# Patient Record
Sex: Female | Born: 1975
Health system: Southern US, Community
[De-identification: ages and names within clinical notes are randomized; demographics above are authoritative.]

## PROBLEM LIST (undated history)

## (undated) ENCOUNTER — Emergency Department (HOSPITAL_COMMUNITY): Admission: EM | Payer: Medicaid Other | Source: Home / Self Care

## (undated) DIAGNOSIS — T7840XA Allergy, unspecified, initial encounter: Secondary | ICD-10-CM

## (undated) DIAGNOSIS — D649 Anemia, unspecified: Secondary | ICD-10-CM

## (undated) DIAGNOSIS — K219 Gastro-esophageal reflux disease without esophagitis: Secondary | ICD-10-CM

## (undated) DIAGNOSIS — K2970 Gastritis, unspecified, without bleeding: Secondary | ICD-10-CM

## (undated) DIAGNOSIS — F1910 Other psychoactive substance abuse, uncomplicated: Secondary | ICD-10-CM

## (undated) DIAGNOSIS — F32A Depression, unspecified: Secondary | ICD-10-CM

## (undated) DIAGNOSIS — K859 Acute pancreatitis without necrosis or infection, unspecified: Secondary | ICD-10-CM

## (undated) DIAGNOSIS — F419 Anxiety disorder, unspecified: Secondary | ICD-10-CM

## (undated) HISTORY — DX: Depression, unspecified: F32.A

## (undated) HISTORY — DX: Acute pancreatitis without necrosis or infection, unspecified: K85.90

## (undated) HISTORY — DX: Allergy, unspecified, initial encounter: T78.40XA

## (undated) HISTORY — DX: Anxiety disorder, unspecified: F41.9

## (undated) HISTORY — PX: ABDOMINAL HYSTERECTOMY: SHX81

## (undated) HISTORY — DX: Gastritis, unspecified, without bleeding: K29.70

## (undated) HISTORY — DX: Other psychoactive substance abuse, uncomplicated: F19.10

## (undated) HISTORY — PX: LAPAROSCOPY FOR ECTOPIC PREGNANCY: SUR765

## (undated) HISTORY — PX: OTHER SURGICAL HISTORY: SHX169

---

## 2003-10-06 ENCOUNTER — Emergency Department (HOSPITAL_COMMUNITY): Admission: EM | Admit: 2003-10-06 | Discharge: 2003-10-06 | Payer: Self-pay | Admitting: Emergency Medicine

## 2004-05-24 ENCOUNTER — Emergency Department (HOSPITAL_COMMUNITY): Admission: EM | Admit: 2004-05-24 | Discharge: 2004-05-24 | Payer: Self-pay | Admitting: Family Medicine

## 2004-05-26 ENCOUNTER — Inpatient Hospital Stay (HOSPITAL_COMMUNITY): Admission: AD | Admit: 2004-05-26 | Discharge: 2004-05-26 | Payer: Self-pay | Admitting: Obstetrics & Gynecology

## 2004-11-17 ENCOUNTER — Inpatient Hospital Stay (HOSPITAL_COMMUNITY): Admission: AD | Admit: 2004-11-17 | Discharge: 2004-11-18 | Payer: Self-pay | Admitting: Obstetrics & Gynecology

## 2004-11-17 ENCOUNTER — Ambulatory Visit: Payer: Self-pay | Admitting: Obstetrics & Gynecology

## 2004-11-18 ENCOUNTER — Encounter (INDEPENDENT_AMBULATORY_CARE_PROVIDER_SITE_OTHER): Payer: Self-pay | Admitting: Specialist

## 2004-12-13 ENCOUNTER — Ambulatory Visit: Payer: Self-pay | Admitting: Obstetrics and Gynecology

## 2005-07-29 ENCOUNTER — Emergency Department (HOSPITAL_COMMUNITY): Admission: EM | Admit: 2005-07-29 | Discharge: 2005-07-29 | Payer: Self-pay | Admitting: Family Medicine

## 2008-10-03 ENCOUNTER — Emergency Department (HOSPITAL_COMMUNITY): Admission: EM | Admit: 2008-10-03 | Discharge: 2008-10-03 | Payer: Self-pay | Admitting: Emergency Medicine

## 2008-10-09 ENCOUNTER — Emergency Department (HOSPITAL_COMMUNITY): Admission: EM | Admit: 2008-10-09 | Discharge: 2008-10-09 | Payer: Self-pay | Admitting: Emergency Medicine

## 2010-07-25 LAB — POCT CARDIAC MARKERS

## 2010-09-02 NOTE — Op Note (Signed)
Danielle Reilly, Danielle Reilly                  ACCOUNT NO.:  [id number]   MEDICAL RECORD NO.:  [id number]          PATIENT TYPE:  INP   LOCATION:  9318                          FACILITY:  [facility]   PHYSICIAN:  Lesly Dukes, M.D. DATE OF BIRTH:  November 20, 1975   DATE OF PROCEDURE:  11/18/2004  DATE OF DISCHARGE:  11/18/2004                                 OPERATIVE REPORT   PREOPERATIVE DIAGNOSIS:  A 35 year old female with left ectopic pregnancy.   POSTOPERATIVE DIAGNOSIS:  A 35 year old female with left ectopic pregnancy.   OPERATION/PROCEDURE:  1.  Diagnostic laparoscopy.  2.  Left salpingectomy.  3.  Removal of ectopic pregnancy.   SURGEON:  Lesly Dukes, M.D.   ASSISTANT:  __________   ANESTHESIA:  General.   ESTIMATED BLOOD LOSS:  Minimal.   COMPLICATIONS:  None.   PATHOLOGY:  Left fallopian tube and ectopic pregnancy.   FINDINGS:  Grossly normal uterus. Filmy adhesions in the posterior cul-de-  sac, most likely secondary to Chlamydia infection.  A large ectopic  pregnancy with a dilated, swollen fallopian tube on the left.  Right tube  grossly normal but some filmy adhesions.  Bilateral ovaries are normal.   DESCRIPTION OF PROCEDURE:  After informed consent was obtained, the patient  was taken to operating room where general anesthesia was induced.  The  patient was prepared and draped in the normal sterile fashion and the  bladder was in-and-out catheterized.  Bivalve speculum was placed in the  patient's vagina and the anterior lip of the cervix grasped with a single-  tooth tenaculum.  Hulka clip was introduced into the uterus aide in  mobilization.  Gown and gloves were changed.  Infraumbilical skin incision  was made in the scalpel and carried down to the fascia.  The fascia was  incised and the peritoneum was entered bluntly.  The Hasson trocar was  introduced into the abdomen and the fascia was tagged with 0 Vicryl.  Pneumoperitoneum was achieved with CO2.  __________   was performed.  A  suprapubic port was placed 2 cm cephalic to the pubic symphysis in the  midline under direct visualization.  A left lower quadrant port was also  placed under direct visualization.  There was good hemostasis at each port  site.  The left fallopian tube was then isolated, elevated and removed with  the tripolar.  Good hemostasis was noted.  __________  irrigator was used to  evacuate old clot.  Once again there was noticed to be some adhesions to the  posterior cul-de-sac  as well as left adnexa and right adnexa.  This is  probable evidence of prior Chlamydial infection.  Hemostasis was assured as  the pneumoperitoneum was relieved and then the laparoscope and trocar were  removed as a single unit.  The fascia was closed with 0 Vicryl and the skin  on all three ports were closed in a  subcuticular fashion with 4-0 Vicryl.  The Hulka clip was removed from the  cervix and the cervix was hemostatic.  The patient tolerated the procedure  well.  Sponge,  lab, instrument, and needle counts were x2. The patient went  to the recovery room in stable condition.       KHL/MEDQ  D:  11/18/2004  T:  11/19/2004  Job:  [id number]

## 2010-12-24 ENCOUNTER — Emergency Department (HOSPITAL_COMMUNITY)
Admission: EM | Admit: 2010-12-24 | Discharge: 2010-12-25 | Disposition: A | Discharge status: Nursing Home (Includes ICF) | Payer: Self-pay | Attending: Emergency Medicine | Admitting: Emergency Medicine

## 2010-12-24 ENCOUNTER — Emergency Department (HOSPITAL_COMMUNITY): Payer: Self-pay

## 2010-12-24 DIAGNOSIS — N859 Noninflammatory disorder of uterus, unspecified: Secondary | ICD-10-CM | POA: Insufficient documentation

## 2010-12-24 DIAGNOSIS — R112 Nausea with vomiting, unspecified: Secondary | ICD-10-CM | POA: Insufficient documentation

## 2010-12-24 DIAGNOSIS — R109 Unspecified abdominal pain: Secondary | ICD-10-CM | POA: Insufficient documentation

## 2010-12-24 DIAGNOSIS — M25519 Pain in unspecified shoulder: Secondary | ICD-10-CM | POA: Insufficient documentation

## 2010-12-24 DIAGNOSIS — R079 Chest pain, unspecified: Secondary | ICD-10-CM | POA: Insufficient documentation

## 2010-12-24 DIAGNOSIS — K6289 Other specified diseases of anus and rectum: Secondary | ICD-10-CM | POA: Insufficient documentation

## 2010-12-24 DIAGNOSIS — R0989 Other specified symptoms and signs involving the circulatory and respiratory systems: Secondary | ICD-10-CM | POA: Insufficient documentation

## 2010-12-24 DIAGNOSIS — R0609 Other forms of dyspnea: Secondary | ICD-10-CM | POA: Insufficient documentation

## 2010-12-24 LAB — URINE MICROSCOPIC-ADD ON

## 2010-12-24 LAB — COMPREHENSIVE METABOLIC PANEL
Alkaline Phosphatase: 40 U/L (ref 39–117)
BUN: 10 mg/dL (ref 6–23)
Chloride: 104 mEq/L (ref 96–112)
Creatinine, Ser: 0.8 mg/dL (ref 0.50–1.10)
GFR calc Af Amer: >60 mL/min (ref >60)
Glucose, Bld: 89 mg/dL (ref 70–99)
Potassium: 4 mEq/L (ref 3.5–5.1)
Total Bilirubin: 0.9 mg/dL (ref 0.3–1.2)

## 2010-12-24 LAB — DIFFERENTIAL
Basophils Absolute: 0 10*3/uL (ref 0.0–0.1)
Eosinophils Relative: 0 % (ref 0–5)
Lymphocytes Relative: 33 % (ref 12–46)
Lymphs Abs: 2.7 10*3/uL (ref 0.7–4.0)
Monocytes Absolute: 0.7 10*3/uL (ref 0.1–1.0)
Monocytes Relative: 8 % (ref 3–12)
Neutro Abs: 4.7 10*3/uL (ref 1.7–7.7)

## 2010-12-24 LAB — URINALYSIS, ROUTINE W REFLEX MICROSCOPIC
Bilirubin Urine: NEGATIVE
Glucose, UA: NEGATIVE mg/dL
Protein, ur: NEGATIVE mg/dL

## 2010-12-24 LAB — POCT PREGNANCY, URINE: Preg Test, Ur: NEGATIVE

## 2010-12-24 LAB — WET PREP, GENITAL
Clue Cells Wet Prep HPF POC: NONE SEEN
Trich, Wet Prep: NONE SEEN
WBC, Wet Prep HPF POC: NONE SEEN
Yeast Wet Prep HPF POC: NONE SEEN

## 2010-12-24 LAB — LIPASE, BLOOD: Lipase: 32 U/L (ref 11–59)

## 2010-12-24 LAB — CBC
HCT: 31.6 % — ABNORMAL LOW (ref 36.0–46.0)
Hemoglobin: 10.7 g/dL — ABNORMAL LOW (ref 12.0–15.0)
MCH: 28.2 pg (ref 26.0–34.0)
MCHC: 33.9 g/dL (ref 30.0–36.0)
MCV: 83.2 fL (ref 78.0–100.0)
Platelets: 227 10*3/uL (ref 150–400)
RBC: 3.8 MIL/uL — ABNORMAL LOW (ref 3.87–5.11)
RDW: 14.1 % (ref 11.5–15.5)
WBC: 8.1 10*3/uL (ref 4.0–10.5)

## 2010-12-25 ENCOUNTER — Encounter (HOSPITAL_COMMUNITY): Payer: Self-pay | Admitting: Anesthesiology

## 2010-12-25 ENCOUNTER — Emergency Department (HOSPITAL_COMMUNITY): Payer: Self-pay

## 2010-12-25 ENCOUNTER — Encounter (HOSPITAL_COMMUNITY): Payer: Self-pay | Admitting: *Deleted

## 2010-12-25 ENCOUNTER — Ambulatory Visit (HOSPITAL_COMMUNITY)
Admission: AD | Admit: 2010-12-25 | Discharge: 2010-12-26 | Disposition: A | Discharge status: Home / Self Care | Payer: Self-pay | Source: Ambulatory Visit | Attending: Obstetrics & Gynecology | Admitting: Obstetrics & Gynecology

## 2010-12-25 ENCOUNTER — Encounter (HOSPITAL_COMMUNITY)
Admission: AD | Disposition: A | Discharge status: Home / Self Care | Payer: Self-pay | Source: Ambulatory Visit | Attending: Obstetrics & Gynecology

## 2010-12-25 ENCOUNTER — Inpatient Hospital Stay (HOSPITAL_COMMUNITY): Payer: Self-pay | Admitting: Anesthesiology

## 2010-12-25 DIAGNOSIS — K661 Hemoperitoneum: Secondary | ICD-10-CM | POA: Insufficient documentation

## 2010-12-25 DIAGNOSIS — N83209 Unspecified ovarian cyst, unspecified side: Principal | ICD-10-CM | POA: Insufficient documentation

## 2010-12-25 HISTORY — DX: Hemoperitoneum: K66.1

## 2010-12-25 HISTORY — PX: LAPAROSCOPY: SHX197

## 2010-12-25 LAB — HCG, SERUM, QUALITATIVE: Preg, Serum: NEGATIVE

## 2010-12-25 LAB — CBC
HCT: 29.4 % — ABNORMAL LOW (ref 36.0–46.0)
MCH: 28 pg (ref 26.0–34.0)
MCHC: 33 g/dL (ref 30.0–36.0)
MCV: 85 fL (ref 78.0–100.0)
RDW: 14.2 % (ref 11.5–15.5)

## 2010-12-25 LAB — COMPREHENSIVE METABOLIC PANEL
Albumin: 3.7 g/dL (ref 3.5–5.2)
BUN: 8 mg/dL (ref 6–23)
Calcium: 9.1 mg/dL (ref 8.4–10.5)
Creatinine, Ser: 0.78 mg/dL (ref 0.50–1.10)
Total Bilirubin: 1.1 mg/dL (ref 0.3–1.2)
Total Protein: 6.9 g/dL (ref 6.0–8.3)

## 2010-12-25 LAB — TYPE AND SCREEN: ABO/RH(D): A POS

## 2010-12-25 SURGERY — LAPAROSCOPY OPERATIVE
Anesthesia: General | Site: Abdomen | Wound class: Clean Contaminated

## 2010-12-25 MED ORDER — KETOROLAC TROMETHAMINE 30 MG/ML IJ SOLN
INTRAMUSCULAR | Status: DC | PRN
  Administered 2010-12-25: 30 mg via INTRAVENOUS

## 2010-12-25 MED ORDER — KETOROLAC TROMETHAMINE 30 MG/ML IJ SOLN
30.0000 mg | Freq: Once | INTRAMUSCULAR | Status: DC
  Filled 2010-12-25: qty 1

## 2010-12-25 MED ORDER — NEOSTIGMINE METHYLSULFATE 1 MG/ML IJ SOLN
INTRAMUSCULAR | Status: AC
  Filled 2010-12-25: qty 10

## 2010-12-25 MED ORDER — IBUPROFEN 600 MG PO TABS
600.0000 mg | ORAL_TABLET | Freq: Four times a day (QID) | ORAL | Status: DC | PRN
  Administered 2010-12-26: 600 mg via ORAL
  Filled 2010-12-25: qty 1

## 2010-12-25 MED ORDER — NEOSTIGMINE METHYLSULFATE 1 MG/ML IJ SOLN
INTRAMUSCULAR | Status: DC | PRN
  Administered 2010-12-25: 3 mg via INTRAMUSCULAR

## 2010-12-25 MED ORDER — KETOROLAC TROMETHAMINE 30 MG/ML IJ SOLN
30.0000 mg | Freq: Four times a day (QID) | INTRAMUSCULAR | Status: DC
  Administered 2010-12-25: 30 mg via INTRAVENOUS

## 2010-12-25 MED ORDER — ACETAMINOPHEN 10 MG/ML IV SOLN
1000.0000 mg | Freq: Once | INTRAVENOUS | Status: DC
  Filled 2010-12-25: qty 100

## 2010-12-25 MED ORDER — PROPOFOL 10 MG/ML IV EMUL
INTRAVENOUS | Status: AC
  Filled 2010-12-25: qty 20

## 2010-12-25 MED ORDER — ONDANSETRON HCL 4 MG PO TABS: 4.0000 mg | ORAL_TABLET | Freq: Four times a day (QID) | ORAL | Status: DC | PRN

## 2010-12-25 MED ORDER — LACTATED RINGERS IR SOLN
Status: DC | PRN
  Administered 2010-12-25: 3000 mL

## 2010-12-25 MED ORDER — FENTANYL CITRATE 0.05 MG/ML IJ SOLN: 25.0000 ug | INTRAMUSCULAR | Status: DC | PRN

## 2010-12-25 MED ORDER — FENTANYL CITRATE 0.05 MG/ML IJ SOLN
INTRAMUSCULAR | Status: AC
  Filled 2010-12-25: qty 5

## 2010-12-25 MED ORDER — SCOPOLAMINE 1 MG/3DAYS TD PT72
1.0000 | MEDICATED_PATCH | Freq: Once | TRANSDERMAL | Status: DC | PRN
  Filled 2010-12-25: qty 1

## 2010-12-25 MED ORDER — OXYCODONE-ACETAMINOPHEN 5-325 MG PO TABS
1.0000 | ORAL_TABLET | ORAL | Status: DC | PRN
  Administered 2010-12-25 – 2010-12-26 (×3): 1 via ORAL
  Filled 2010-12-25 (×3): qty 1

## 2010-12-25 MED ORDER — MORPHINE SULFATE 4 MG/ML IJ SOLN
2.0000 mg | INTRAMUSCULAR | Status: DC | PRN
  Administered 2010-12-25: 2 mg via INTRAVENOUS
  Filled 2010-12-25: qty 1

## 2010-12-25 MED ORDER — ONDANSETRON HCL 4 MG/2ML IJ SOLN: 4.0000 mg | Freq: Four times a day (QID) | INTRAMUSCULAR | Status: DC | PRN

## 2010-12-25 MED ORDER — LIDOCAINE HCL (CARDIAC) 20 MG/ML IV SOLN
INTRAVENOUS | Status: AC
  Filled 2010-12-25: qty 5

## 2010-12-25 MED ORDER — SODIUM CHLORIDE 0.9 % IJ SOLN
3.0000 mL | Freq: Two times a day (BID) | INTRAMUSCULAR | Status: DC
  Administered 2010-12-25: 3 mL via INTRAVENOUS

## 2010-12-25 MED ORDER — FAMOTIDINE 20 MG PO TABS: 20.0000 mg | ORAL_TABLET | Freq: Once | ORAL | Status: DC | PRN

## 2010-12-25 MED ORDER — MORPHINE SULFATE 2 MG/ML IJ SOLN: 1.0000 mg | INTRAMUSCULAR | Status: DC | PRN

## 2010-12-25 MED ORDER — SODIUM CHLORIDE 0.9 % IV SOLN: INTRAVENOUS | Status: DC

## 2010-12-25 MED ORDER — KETOROLAC TROMETHAMINE 30 MG/ML IJ SOLN: 30.0000 mg | Freq: Four times a day (QID) | INTRAMUSCULAR | Status: DC

## 2010-12-25 MED ORDER — PANTOPRAZOLE SODIUM 40 MG PO TBEC
40.0000 mg | DELAYED_RELEASE_TABLET | Freq: Once | ORAL | Status: DC | PRN
  Filled 2010-12-25: qty 1

## 2010-12-25 MED ORDER — MAGNESIUM HYDROXIDE 400 MG/5ML PO SUSP: 30.0000 mL | Freq: Every day | ORAL | Status: DC | PRN

## 2010-12-25 MED ORDER — ROCURONIUM BROMIDE 50 MG/5ML IV SOLN
INTRAVENOUS | Status: AC
  Filled 2010-12-25: qty 1

## 2010-12-25 MED ORDER — CITRIC ACID-SODIUM CITRATE 334-500 MG/5ML PO SOLN: 30.0000 mL | Freq: Once | ORAL | Status: DC | PRN

## 2010-12-25 MED ORDER — ACETAMINOPHEN 10 MG/ML IV SOLN
INTRAVENOUS | Status: DC | PRN
  Administered 2010-12-25: 1000 mg via INTRAVENOUS

## 2010-12-25 MED ORDER — ONDANSETRON HCL 4 MG/2ML IJ SOLN
INTRAMUSCULAR | Status: DC | PRN
  Administered 2010-12-25: 4 mg via INTRAVENOUS

## 2010-12-25 MED ORDER — LACTATED RINGERS IV SOLN
INTRAVENOUS | Status: DC | PRN
  Administered 2010-12-25 (×2): via INTRAVENOUS

## 2010-12-25 MED ORDER — DEXTROSE IN LACTATED RINGERS 5 % IV SOLN: INTRAVENOUS | Status: DC

## 2010-12-25 MED ORDER — DEXAMETHASONE SODIUM PHOSPHATE 4 MG/ML IJ SOLN
INTRAMUSCULAR | Status: DC | PRN
  Administered 2010-12-25: 10 mg via INTRAVENOUS

## 2010-12-25 MED ORDER — KETOROLAC TROMETHAMINE 30 MG/ML IJ SOLN
INTRAMUSCULAR | Status: AC
  Filled 2010-12-25: qty 1

## 2010-12-25 MED ORDER — LIDOCAINE HCL (CARDIAC) 20 MG/ML IV SOLN
INTRAVENOUS | Status: DC | PRN
  Administered 2010-12-25: 50 mg via INTRAVENOUS

## 2010-12-25 MED ORDER — GLYCOPYRROLATE 0.2 MG/ML IJ SOLN
INTRAMUSCULAR | Status: DC | PRN
  Administered 2010-12-25: .6 mg via INTRAVENOUS

## 2010-12-25 MED ORDER — PROPOFOL 10 MG/ML IV EMUL
INTRAVENOUS | Status: DC | PRN
  Administered 2010-12-25: 150 mg via INTRAVENOUS

## 2010-12-25 MED ORDER — ZOLPIDEM TARTRATE 5 MG PO TABS: 5.0000 mg | ORAL_TABLET | Freq: Every evening | ORAL | Status: DC | PRN

## 2010-12-25 MED ORDER — METOCLOPRAMIDE HCL 10 MG PO TABS: 10.0000 mg | ORAL_TABLET | Freq: Once | ORAL | Status: DC | PRN

## 2010-12-25 MED ORDER — IOHEXOL 300 MG/ML  SOLN
100.0000 mL | Freq: Once | INTRAMUSCULAR | Status: AC | PRN
  Administered 2010-12-25: 100 mL via INTRAVENOUS

## 2010-12-25 MED ORDER — MIDAZOLAM HCL 5 MG/5ML IJ SOLN
INTRAMUSCULAR | Status: DC | PRN
  Administered 2010-12-25: 2 mg via INTRAVENOUS

## 2010-12-25 MED ORDER — CEFAZOLIN SODIUM 1-5 GM-% IV SOLN
1.0000 g | Freq: Once | INTRAVENOUS | Status: AC
  Administered 2010-12-25: 1 g via INTRAVENOUS
  Filled 2010-12-25: qty 50

## 2010-12-25 MED ORDER — MORPHINE SULFATE 4 MG/ML IJ SOLN
1.0000 mg | INTRAMUSCULAR | Status: DC | PRN
  Administered 2010-12-25: 2 mg via INTRAVENOUS
  Filled 2010-12-25: qty 1

## 2010-12-25 MED ORDER — ONDANSETRON HCL 4 MG/2ML IJ SOLN
INTRAMUSCULAR | Status: AC
  Filled 2010-12-25: qty 2

## 2010-12-25 MED ORDER — MIDAZOLAM HCL 2 MG/2ML IJ SOLN
INTRAMUSCULAR | Status: AC
  Filled 2010-12-25: qty 2

## 2010-12-25 MED ORDER — BUPIVACAINE HCL (PF) 0.25 % IJ SOLN
INTRAMUSCULAR | Status: DC | PRN
  Administered 2010-12-25: 4 mL

## 2010-12-25 MED ORDER — GLYCOPYRROLATE 0.2 MG/ML IJ SOLN
INTRAMUSCULAR | Status: AC
  Filled 2010-12-25: qty 1

## 2010-12-25 MED ORDER — FENTANYL CITRATE 0.05 MG/ML IJ SOLN
INTRAMUSCULAR | Status: DC | PRN
  Administered 2010-12-25: 50 ug via INTRAVENOUS
  Administered 2010-12-25: 100 ug via INTRAVENOUS
  Administered 2010-12-25: 50 ug via INTRAVENOUS

## 2010-12-25 MED ORDER — ROCURONIUM BROMIDE 100 MG/10ML IV SOLN
INTRAVENOUS | Status: DC | PRN
  Administered 2010-12-25: 40 mg via INTRAVENOUS

## 2010-12-25 SURGICAL SUPPLY — 36 items
CABLE HIGH FREQUENCY MONO STRZ (ELECTRODE) IMPLANT
CANISTER SUCTION 2500CC (MISCELLANEOUS) ×2 IMPLANT
CATH ROBINSON RED A/P 16FR (CATHETERS) IMPLANT
CHLORAPREP W/TINT 26ML (MISCELLANEOUS) ×2 IMPLANT
CLOTH BEACON ORANGE TIMEOUT ST (SAFETY) ×2 IMPLANT
CONT SPECI 4OZ STER CLIK (MISCELLANEOUS) IMPLANT
DERMABOND ADVANCED (GAUZE/BANDAGES/DRESSINGS) ×1
DERMABOND ADVANCED .7 DNX12 (GAUZE/BANDAGES/DRESSINGS) ×1 IMPLANT
DRAPE UTILITY XL STRL (DRAPES) IMPLANT
DRSG COVADERM PLUS 2X2 (GAUZE/BANDAGES/DRESSINGS) ×2 IMPLANT
GAUZE SPONGE 4X4 16PLY XRAY LF (GAUZE/BANDAGES/DRESSINGS) ×2 IMPLANT
GLOVE BIO SURGEON STRL SZ 6.5 (GLOVE) ×8 IMPLANT
GOWN PREVENTION PLUS LG XLONG (DISPOSABLE) ×6 IMPLANT
NS IRRIG 1000ML POUR BTL (IV SOLUTION) ×2 IMPLANT
PACK ABDOMINAL GYN (CUSTOM PROCEDURE TRAY) ×2 IMPLANT
PACK LAPAROSCOPY BASIN (CUSTOM PROCEDURE TRAY) ×2 IMPLANT
POUCH SPECIMEN RETRIEVAL 10MM (ENDOMECHANICALS) IMPLANT
SEALER TISSUE G2 CVD JAW 35 (ENDOMECHANICALS) IMPLANT
SEALER TISSUE G2 CVD JAW 45CM (ENDOMECHANICALS) IMPLANT
SET IRRIG TUBING LAPAROSCOPIC (IRRIGATION / IRRIGATOR) ×2 IMPLANT
SPONGE LAP 18X18 X RAY DECT (DISPOSABLE) ×4 IMPLANT
SUT MNCRL AB 4-0 PS2 18 (SUTURE) ×2 IMPLANT
SUT MON AB 2-0 CT1 27 (SUTURE) ×2 IMPLANT
SUT MON AB 3-0 SH 27 (SUTURE) ×1
SUT MON AB 3-0 SH27 (SUTURE) ×1 IMPLANT
SUT PDS AB 1 CTX 36 (SUTURE) ×2 IMPLANT
SUT VIC AB 0 CT1 27 (SUTURE) ×1
SUT VIC AB 0 CT1 27XCR 8 STRN (SUTURE) ×1 IMPLANT
SUT VICRYL 0 TIES 12 18 (SUTURE) ×2 IMPLANT
SUT VICRYL 0 UR6 27IN ABS (SUTURE) ×2 IMPLANT
SUT VICRYL 4-0 PS2 18IN ABS (SUTURE) IMPLANT
TOWEL OR 17X24 6PK STRL BLUE (TOWEL DISPOSABLE) ×4 IMPLANT
TRAY FOLEY CATH 14FR (SET/KITS/TRAYS/PACK) ×2 IMPLANT
TROCAR XCEL NON-BLD 11X100MML (ENDOMECHANICALS) ×2 IMPLANT
TROCAR XCEL NON-BLD 5MMX100MML (ENDOMECHANICALS) ×2 IMPLANT
WATER STERILE IRR 1000ML POUR (IV SOLUTION) ×2 IMPLANT

## 2010-12-25 NOTE — Anesthesia Postprocedure Evaluation (Signed)
Anesthesia Post Note  Patient: Danielle Reilly  Procedure(s) Performed:  LAPAROSCOPY OPERATIVE - Operative laparoscopy /cauterization of right hemorrhagic cyst wall. Removal of belly rings.  Anesthesia type: GA  Patient location: PACU  Post pain: Pain level controlled  Post assessment: Post-op Vital signs reviewed  Last Vitals:  Filed Vitals:   12/25/10 1345  BP: 105/54  Pulse: 65  Temp:   Resp: 16    Post vital signs: Reviewed  Level of consciousness: sedated  Complications: No apparent anesthesia complications
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
## 2010-12-25 NOTE — Transfer of Care (Signed)
Immediate Anesthesia Transfer of Care Note  Patient: Danielle Reilly  Procedure(s) Performed:  LAPAROSCOPY OPERATIVE - Operative laparoscopy /cauterization of right hemorrhagic cyst wall  Patient Location: PACU  Anesthesia Type: General  Level of Consciousness: awake, alert  and oriented  Airway & Oxygen Therapy: Patient Spontanous Breathing and Patient connected to nasal cannula oxygen  Post-op Assessment: Report given to PACU RN and Post -op Vital signs reviewed and stable  Post vital signs: Reviewed and stable  Complications: No apparent anesthesia complications

## 2010-12-25 NOTE — Op Note (Signed)
Procedure Note  Danielle Reilly 35 y.o. 12/25/2010  Preoperative Diagnosis:  Hemoperitoneum, R/O traumatic uterine rupture  Postoperative Diagnosis: Hemoperitoneum, ruptured hemorrhagic right ovarian cyst  Procedure: Laparoscopic coagulation of ovarian cortex rent, evacuation of hemoperitoneum  Surgeon: Roseanna Rainbow  Assistant: Zelphia Cairo  Indications:  The patient now presents for a diagnostic, possible operative laparoscopy        Procedure Detail:  The patient was taken to the operating room and was placed on the operating table in the dorsal supine position.  After satisfactory general anesthesia was achieved, the patient was placed in the semi-lithotomy position using Allen stirrups. The patient was prepped and draped in the usual sterile manner for vaginal laparoscopic procedure. A speculum was placed in the vagina. The anterior lip of the cervix was grasped with a single-tooth tenaculum. A Khan cannula manipulator was then advanced into the uterus and secured  to the anterior lip of the cervix as a means to manipulate the uterus. The manipulator was placed after the pelvis had been inspected at laparoscopy. The infraumbilical region was then anesthetized with local anesthesia, quarter percent Artane. A small incision was made to the skin and subcutaneous tissue. A 10 mm Optiview trocar was placed through the incision into the abdominal cavity diagnostic laparoscope with video camera attached was placed through the trocar sleeve and carbon dioxide was used to insufflate the abdominal and pelvic cavity. Bilateral 5 mm ports were placed in the abdomen under direct visualization.  The pelvic contents were examined.  The posterior uterine wall was intact.  There was clot adherent to the right ovary.  On further inspection there was a hemorrhagic cystic lesion.  There was a small rent, approximately 1 cm in length.  There was oozing at the site.   Kleppinger bipolar forceps were used to  grasp and coagulate this site. Adequate hemostasis was noted.  The abdomen and pelvis were copiously irrigated the blood/clots aspirated.  The scope was removed and the excess carbon dioxide was remove through the periumbilical port, before it was removed.  The subcutaneous layer of this incision was reapproximated with an interrupted figure-of eight 0-Vicryl suture on a UR 6 needle. The skin incisions were reapproximated with stitches of 4-0 Vicryl suture or an adhesive.  The instruments removed from the vagina there is minimal bleeding from the cervix. Final sponge, instrument and needle counts were correct. The patient was awakened on the operating table and taken to the PACU in satisfactory condition.    Findings: See above   Estimated Blood Loss:  Minimal              Total IV Fluids:  per Anesthesiology        Condition: Stable

## 2010-12-25 NOTE — Anesthesia Preprocedure Evaluation (Addendum)
Anesthesia Evaluation  Name, MR# and DOB Patient awake  General Assessment Comment  Reviewed: Allergy & Precautions, H&P , Patient's Chart, lab work & pertinent test results, reviewed documented beta blocker date and time   History of Anesthesia Complications Negative for: history of anesthetic complications  Airway Mallampati: II TM Distance: >3 FB Neck ROM: full    Dental No notable dental hx.    Pulmonary  clear to auscultation  pulmonary exam normalPulmonary Exam Normal breath sounds clear to auscultation none    Cardiovascular Exercise Tolerance: Good regular Normal    Neuro/Psych Negative Neurological ROS  Negative Psych ROS  GI/Hepatic/Renal negative GI ROS  negative Liver ROS  negative Renal ROS        Endo/Other  Negative Endocrine ROS (+)      Abdominal   Musculoskeletal   Hematology negative hematology ROS (+)   Peds  Reproductive/Obstetrics negative OB ROS    Anesthesia Other Findings             Anesthesia Physical Anesthesia Plan  ASA: II and Emergent  Anesthesia Plan: General   Post-op Pain Management:    Induction:   Airway Management Planned: Oral ETT  Additional Equipment:   Intra-op Plan:   Post-operative Plan:   Informed Consent: I have reviewed the patients History and Physical, chart, labs and discussed the procedure including the risks, benefits and alternatives for the proposed anesthesia with the patient or authorized representative who has indicated his/her understanding and acceptance.   Dental Advisory Given  Plan Discussed with: CRNA and Surgeon  Anesthesia Plan Comments:         Anesthesia Quick Evaluation

## 2010-12-26 DIAGNOSIS — N83209 Unspecified ovarian cyst, unspecified side: Secondary | ICD-10-CM | POA: Diagnosis present

## 2010-12-26 LAB — GC/CHLAMYDIA PROBE AMP, GENITAL: GC Probe Amp, Genital: NEGATIVE

## 2010-12-26 NOTE — Progress Notes (Signed)
Pt discharged home... Condition stable... No equipment... Ambulated to car with Carol Riley, NT.  

## 2010-12-26 NOTE — Progress Notes (Signed)
1 Day Post-Op Procedure(s) (LRB): LAPAROSCOPY OPERATIVE (N/A)  Subjective: Patient reports no problems voiding.    Objective: Vital signs in last 24 hours: Temp:  [97.7 F (36.5 C)-98.9 F (37.2 C)] 98.9 F (37.2 C) (09/10 0550) Pulse Rate:  [63-110] 96  (09/10 0550) Resp:  [16-18] 16  (09/10 0550) BP: (89-117)/(47-69) 89/47 mmHg (09/10 0550) SpO2:  [96 %-100 %] 100 % (09/10 0550) Last BM Date: 12/24/10  Intake/Output from previous day: 09/09 0701 - 09/10 0700 In: 2383 [P.O.:480; I.V.:1903] Out: 1475 [Urine:1375; Blood:100] Intake/Output this shift:    Physical Examination:  General: alert GI: soft, non-tender; bowel sounds normal; no masses,  no organomegaly   Labs:       Assessment:  35 y.o. s/p Procedure(s): LAPAROSCOPY OPERATIVE: stable  Pain: Pain is well-controlled on  oral medications.  Heme: Anemia: stable    Plan: Discharge home   LOS: 1 day     JACKSON-MOORE,Amylynn Fano A 12/26/2010, 9:10 AM

## 2010-12-26 NOTE — Discharge Summary (Signed)
  Physician Discharge Summary  Patient ID: Danielle Reilly MRN: [id number] DOB/AGE: [birth year] 35 y.o.  Admit date: 12/25/2010 Discharge date: 12/26/2010  Admission Diagnoses: Hemoperitoneum  Discharge Diagnoses:  Active Problems:  Hemoperitoneum  Hemorrhagic ovarian cyst   Discharged Condition: good  Hospital Course: The patient was admitted an underwent an operative laparoscopy.  Please see the operative summary for details.  The postoperative course was uneventful.  Consults: none  Significant Diagnostic Studies: radiology: CT scan and Ultrasound: showed a hemoperitoneum?uterine rupture  Treatments: surgery: See above  Discharge Exam: Blood pressure 89/47, pulse 96, temperature 98.9 F (37.2 C), temperature source Oral, resp. rate 16, height 5\' 3"  (1.6 m), weight 63.549 kg (140 lb 1.6 oz), last menstrual period 11/27/2010, SpO2 100.00%. General appearance: alert GI: soft, non-tender; bowel sounds normal; no masses,  no organomegaly  Disposition: Home   Current Discharge Medication List    CONTINUE these medications which have NOT CHANGED   Details  cyclobenzaprine (FLEXERIL) 10 MG tablet Take 10 mg by mouth 3 (three) times daily as needed. For pain     HYDROcodone-acetaminophen (VICODIN) 5-500 MG per tablet Take 1 tablet by mouth every 6 (six) hours as needed. For pain     Multiple Vitamins-Calcium (ONE-A-DAY WOMENS PO) Take 1 tablet by mouth daily.         Follow-up Information    Follow up with Antionette Char A, MD. Call in 6 weeks.   Contact information:   [address], [address] [place] [address] [phone]          Signed: Roseanna Rainbow 12/26/2010, 9:06 AM

## 2011-01-12 ENCOUNTER — Encounter (HOSPITAL_COMMUNITY): Payer: Self-pay | Admitting: Obstetrics & Gynecology

## 2012-05-23 ENCOUNTER — Emergency Department (INDEPENDENT_AMBULATORY_CARE_PROVIDER_SITE_OTHER)
Admission: EM | Admit: 2012-05-23 | Discharge: 2012-05-23 | Disposition: A | Discharge status: Home / Self Care | Payer: Self-pay | Source: Home / Self Care | Attending: Emergency Medicine | Admitting: Emergency Medicine

## 2012-05-23 ENCOUNTER — Encounter (HOSPITAL_COMMUNITY): Payer: Self-pay | Admitting: Emergency Medicine

## 2012-05-23 DIAGNOSIS — J069 Acute upper respiratory infection, unspecified: Secondary | ICD-10-CM

## 2012-05-23 DIAGNOSIS — K5900 Constipation, unspecified: Secondary | ICD-10-CM

## 2012-05-23 MED ORDER — POLYETHYLENE GLYCOL 3350 17 GM/SCOOP PO POWD: 17.0000 g | Freq: Every day | ORAL | Status: DC

## 2012-05-23 MED ORDER — FEXOFENADINE-PSEUDOEPHED ER 60-120 MG PO TB12: 1.0000 | ORAL_TABLET | Freq: Two times a day (BID) | ORAL | Status: DC

## 2012-05-23 MED ORDER — NAPROXEN 500 MG PO TABS: 500.0000 mg | ORAL_TABLET | Freq: Two times a day (BID) | ORAL | Status: DC

## 2012-05-23 MED ORDER — BENZONATATE 200 MG PO CAPS: 200.0000 mg | ORAL_CAPSULE | Freq: Three times a day (TID) | ORAL | Status: DC | PRN

## 2012-05-23 NOTE — ED Provider Notes (Signed)
Chief Complaint  Patient presents with  . URI    History of Present Illness:   Danielle Reilly  is a 37 year old female who has had a five-day history of bilateral ear aching and congestion, sore throat, dry cough, wheezing, chest pain, nasal congestion with yellow drainage, headache, watery eyes, chills, and it has felt lightheaded. She has been constipated lately and has not had a bowel movement since symptoms began. She denies any fever, nausea, vomiting, or abdominal pain. She has not been exposed to anything in particular. She is not a cigarette smoker. She has not taken any medications at home for symptom relief.  Review of Systems:  Other than noted above, the patient denies any of the following symptoms. Systemic:  No fever, chills, sweats, fatigue, myalgias, headache, or anorexia. Eye:  No redness, pain or drainage. ENT:  No earache, ear congestion, nasal congestion, sneezing, rhinorrhea, sinus pressure, sinus pain, post nasal drip, or sore throat. Lungs:  No cough, sputum production, wheezing, shortness of breath, or chest pain. GI:  No abdominal pain, nausea, vomiting, or diarrhea.  PMFSH:  Past medical history, family history, social history, meds, and allergies were reviewed.  Physical Exam:   Vital signs:  BP 106/65  Pulse 78  Temp 98.5 F (36.9 C) (Oral)  Resp 18  SpO2 98%  LMP 05/13/2012 General:  Alert, in no distress. Eye:  No conjunctival injection or drainage. Lids were normal. ENT:  Right TM was slightly pink but there was no air-fluid level and no bulging, no pus, canal is clear, left TM and canal are normal.  Nasal mucosa was clear and uncongested, without drainage.  Mucous membranes were moist.  Pharynx was clear, without exudate or drainage.  There were no oral ulcerations or lesions. Neck:  Supple, no adenopathy, tenderness or mass. Lungs:  No respiratory distress.  Lungs were clear to auscultation, without wheezes, rales or rhonchi.  Breath sounds were clear and  equal bilaterally.  Heart:  Regular rhythm, without gallops, murmers or rubs. Skin:  Clear, warm, and dry, without rash or lesions.  Assessment:  The primary encounter diagnosis was Viral upper respiratory infection. A diagnosis of Constipated was also pertinent to this visit.  Plan:   1.  The following meds were prescribed:   New Prescriptions   BENZONATATE (TESSALON) 200 MG CAPSULE    Take 1 capsule (200 mg total) by mouth 3 (three) times daily as needed for cough.   FEXOFENADINE-PSEUDOEPHEDRINE (ALLEGRA-D) 60-120 MG PER TABLET    Take 1 tablet by mouth every 12 (twelve) hours.   NAPROXEN (NAPROSYN) 500 MG TABLET    Take 1 tablet (500 mg total) by mouth 2 (two) times daily.   POLYETHYLENE GLYCOL POWDER (MIRALAX) POWDER    Take 17 g by mouth daily.   2.  The patient was instructed in symptomatic care and handouts were given. 3.  The patient was told to return if becoming worse in any way, if no better in 3 or 4 days, and given some red flag symptoms that would indicate earlier return.   Reuben Likes, MD 05/23/12 1409

## 2012-05-23 NOTE — ED Notes (Signed)
Symptoms: cough, chest congestion, both ears aching, sore throat, runny nose, and body aches and denies fever.  Onset of symptoms was Sunday 05/19/12.  Last bm was Sunday.  Denies any history of constipation

## 2012-10-16 ENCOUNTER — Ambulatory Visit: Payer: Self-pay | Attending: Family Medicine | Admitting: Internal Medicine

## 2012-10-16 ENCOUNTER — Other Ambulatory Visit: Payer: Self-pay | Admitting: Internal Medicine

## 2012-10-16 VITALS — BP 119/75 | HR 72 | Temp 98.9°F | Resp 15 | Wt 155.2 lb

## 2012-10-16 DIAGNOSIS — J020 Streptococcal pharyngitis: Secondary | ICD-10-CM

## 2012-10-16 LAB — CBC WITH DIFFERENTIAL/PLATELET
Basophils Relative: 1 % (ref 0–1)
Eosinophils Absolute: 0 10*3/uL (ref 0.0–0.7)
HCT: 28.3 % — ABNORMAL LOW (ref 36.0–46.0)
Hemoglobin: 9.5 g/dL — ABNORMAL LOW (ref 12.0–15.0)
Lymphocytes Relative: 34 % (ref 12–46)
Lymphs Abs: 1.9 10*3/uL (ref 0.7–4.0)
Monocytes Absolute: 0.5 10*3/uL (ref 0.1–1.0)
Monocytes Relative: 9 % (ref 3–12)
Neutro Abs: 3.2 10*3/uL (ref 1.7–7.7)
RBC: 3.65 MIL/uL — ABNORMAL LOW (ref 3.87–5.11)
WBC: 5.7 10*3/uL (ref 4.0–10.5)

## 2012-10-16 MED ORDER — AMOXICILLIN-POT CLAVULANATE 875-125 MG PO TABS: 1.0000 | ORAL_TABLET | Freq: Two times a day (BID) | ORAL | Status: AC

## 2012-10-16 MED ORDER — MENTHOL 5 MG MT LOZG: LOZENGE | OROMUCOSAL | Status: DC

## 2012-10-16 NOTE — Progress Notes (Unsigned)
Patient complains of sore throat and left ear pain past two days Not able to swallow or spit

## 2012-10-16 NOTE — Progress Notes (Unsigned)
Patient ID: Danielle Reilly, female   DOB: [birth year], 37 y.o.   MRN: [id number]  CC:  HPI: 37 year old female who presents for evaluation of a sore throat. The patient complains of left-sided tonsillar pain as well as left ear pain. She complains of pain when she swallows. She denies any fever.   No Known Allergies History reviewed. No pertinent past medical history. Current Outpatient Prescriptions on File Prior to Visit  Medication Sig Dispense Refill  . benzonatate (TESSALON) 200 MG capsule Take 1 capsule (200 mg total) by mouth 3 (three) times daily as needed for cough.  30 capsule  0  . Chlorphen-Pseudoephed-APAP (THERAFLU FLU/COLD PO) Take by mouth.      . cyclobenzaprine (FLEXERIL) 10 MG tablet Take 10 mg by mouth 3 (three) times daily as needed. For pain       . fexofenadine-pseudoephedrine (ALLEGRA-D) 60-120 MG per tablet Take 1 tablet by mouth every 12 (twelve) hours.  30 tablet  0  . HYDROcodone-acetaminophen (VICODIN) 5-500 MG per tablet Take 1 tablet by mouth every 6 (six) hours as needed. For pain       . Multiple Vitamins-Calcium (ONE-A-DAY WOMENS PO) Take 1 tablet by mouth daily.        . naproxen (NAPROSYN) 500 MG tablet Take 1 tablet (500 mg total) by mouth 2 (two) times daily.  30 tablet  0  . polyethylene glycol powder (MIRALAX) powder Take 17 g by mouth daily.  255 g  0   No current facility-administered medications on file prior to visit.   Family History  Problem Relation Age of Onset  . Drug abuse Mother   . Arthritis Maternal Aunt   . Cancer Maternal Aunt   . Drug abuse Maternal Aunt   . Arthritis Maternal Uncle   . Alcohol abuse Maternal Uncle   . Cancer Maternal Uncle   . Drug abuse Maternal Uncle   . Cancer Maternal Grandmother   . Alcohol abuse Maternal Grandfather   . Cancer Maternal Grandfather    History   Social History  . Marital Status: Legally Separated    Spouse Name: N/A    Number of Children: N/A  . Years of Education: N/A   Occupational  History  . Not on file.   Social History Main Topics  . Smoking status: Never Smoker   . Smokeless tobacco: Not on file  . Alcohol Use: 1.8 oz/week    3 Glasses of wine per week  . Drug Use:   . Sexually Active: Yes    Birth Control/ Protection: Condom   Other Topics Concern  . Not on file   Social History Narrative  . No narrative on file    Review of Systems  Constitutional: Negative for fever, chills, diaphoresis, activity change, appetite change and fatigue.  HENT: Negative for ear pain, nosebleeds, congestion, facial swelling, rhinorrhea, neck pain, neck stiffness and ear discharge.   Eyes: Negative for pain, discharge, redness, itching and visual disturbance.  Respiratory: Negative for cough, choking, chest tightness, shortness of breath, wheezing and stridor.   Cardiovascular: Negative for chest pain, palpitations and leg swelling.  Gastrointestinal: Negative for abdominal distention.  Genitourinary: Negative for dysuria, urgency, frequency, hematuria, flank pain, decreased urine volume, difficulty urinating and dyspareunia.  Musculoskeletal: Negative for back pain, joint swelling, arthralgias and gait problem.  Neurological: Negative for dizziness, tremors, seizures, syncope, facial asymmetry, speech difficulty, weakness, light-headedness, numbness and headaches.  Hematological: Negative for adenopathy. Does not bruise/bleed easily.  Psychiatric/Behavioral: Negative for hallucinations, behavioral  problems, confusion, dysphoric mood, decreased concentration and agitation.    Objective:   Filed Vitals:   10/16/12 1623  BP: 119/75  Pulse: 72  Temp: 98.9 F (37.2 C)  Resp: 15    Physical Exam  Constitutional: Appears well-developed and well-nourished. No distress.  HENT: Normocephalic. External right and left ear normal. Oropharynx is clear and moist.  Eyes: Conjunctivae and EOM are normal. PERRLA, no scleral icterus.  Neck: Normal ROM. Neck supple. No JVD. No  tracheal deviation. No thyromegaly.  CVS: RRR, S1/S2 +, no murmurs, no gallops, no carotid bruit.  Pulmonary: Effort and breath sounds normal, no stridor, rhonchi, wheezes, rales.  Abdominal: Soft. BS +,  no distension, tenderness, rebound or guarding.  Musculoskeletal: Normal range of motion. No edema and no tenderness.  Lymphadenopathy: No lymphadenopathy noted, cervical, inguinal. Neuro: Alert. Normal reflexes, muscle tone coordination. No cranial nerve deficit. Skin: Skin is warm and dry. No rash noted. Not diaphoretic. No erythema. No pallor.  Psychiatric: Normal mood and affect. Behavior, judgment, thought content normal.   Lab Results  Component Value Date   WBC 5.9 12/25/2010   HGB 9.7* 12/25/2010   HCT 29.4* 12/25/2010   MCV 85.0 12/25/2010   PLT 189 12/25/2010   Lab Results  Component Value Date   CREATININE 0.78 12/25/2010   BUN 8 12/25/2010   NA 133* 12/25/2010   K 3.3* 12/25/2010   CL 100 12/25/2010   CO2 27 12/25/2010    No results found for this basename: HGBA1C   Lipid Panel  No results found for this basename: chol, trig, hdl, cholhdl, vldl, ldlcalc       Assessment and plan:   Patient Active Problem List   Diagnosis Date Noted  . Hemorrhagic ovarian cyst 12/26/2010  . Hemoperitoneum 12/25/2010   Laryngitis/pharyngitis Likely strep throat We'll prescribe Augmentin for 7 days cepacol lozenges Followup as needed

## 2012-10-19 LAB — CULTURE, GROUP A STREP: Organism ID, Bacteria: NORMAL

## 2012-10-22 LAB — INFLUENZA A & B PCR

## 2012-10-23 ENCOUNTER — Ambulatory Visit: Payer: Self-pay

## 2012-10-24 ENCOUNTER — Ambulatory Visit: Payer: Self-pay

## 2012-10-25 ENCOUNTER — Inpatient Hospital Stay (HOSPITAL_COMMUNITY): Payer: Self-pay

## 2012-10-25 ENCOUNTER — Inpatient Hospital Stay (HOSPITAL_COMMUNITY)
Admission: AD | Admit: 2012-10-25 | Discharge: 2012-10-25 | Disposition: A | Discharge status: Home / Self Care | Payer: Self-pay | Source: Ambulatory Visit | Attending: Obstetrics & Gynecology | Admitting: Obstetrics & Gynecology

## 2012-10-25 DIAGNOSIS — N938 Other specified abnormal uterine and vaginal bleeding: Secondary | ICD-10-CM | POA: Insufficient documentation

## 2012-10-25 DIAGNOSIS — N949 Unspecified condition associated with female genital organs and menstrual cycle: Secondary | ICD-10-CM | POA: Insufficient documentation

## 2012-10-25 LAB — CBC
Hemoglobin: 8.9 g/dL — ABNORMAL LOW (ref 12.0–15.0)
MCH: 25.4 pg — ABNORMAL LOW (ref 26.0–34.0)
MCHC: 32.2 g/dL (ref 30.0–36.0)
MCV: 78.9 fL (ref 78.0–100.0)
Platelets: 291 10*3/uL (ref 150–400)

## 2012-10-25 LAB — WET PREP, GENITAL
WBC, Wet Prep HPF POC: NONE SEEN
Yeast Wet Prep HPF POC: NONE SEEN

## 2012-10-25 MED ORDER — FERROUS SULFATE 325 (65 FE) MG PO TABS: 325.0000 mg | ORAL_TABLET | Freq: Every day | ORAL | Status: DC

## 2012-10-25 MED ORDER — MEGESTROL ACETATE 40 MG PO TABS: ORAL_TABLET | ORAL | Status: DC

## 2012-10-25 NOTE — MAU Provider Note (Signed)
History     CSN: [id number]  Arrival date and time: 10/25/12 1538   First Provider Initiated Contact with Patient 10/25/12 1649      Chief Complaint  Patient presents with  . Vaginal Bleeding   HPI [name] is a 37 y.o. [phone] who presents to MAU today with vaginal bleeding. The patient states that she was started on Seasonique OCPs about 2 months ago and had some bleeding daily, sometimes light and sometimes heavy. She was then changed to a monophasic OCP about 1 month ago and has been bleeding heavier since starting those pills. She has been seen at [employer] [facility] She states that she is still bleeding heavily today. She has had increased fatigue, but denies weakness, dizziness or feeling lightheaded. She denies abdominal pain. She has not had any previous episodes of irregular bleeding. She has not tried any medications for bleeding control aside from OCPs. The patient states that she was originally put on OCPs in order to avoid recurrent ovarian cysts.   OB History   Grav Para Term Preterm Abortions TAB SAB Ect Mult Living   4 1 1  3 1 1 1  1       No past medical history on file.  Past Surgical History  Procedure Laterality Date  . Laparoscopy for ectopic pregnancy    . Laparoscopy  12/25/2010    Procedure: LAPAROSCOPY OPERATIVE;  Surgeon: Roseanna Rainbow, MD;  Location: [facility];  Service: Gynecology;  Laterality: N/A;  Operative laparoscopy /cauterization of right hemorrhagic cyst wall. Removal of belly rings.    Family History  Problem Relation Age of Onset  . Drug abuse Mother   . Arthritis Maternal Aunt   . Cancer Maternal Aunt   . Drug abuse Maternal Aunt   . Arthritis Maternal Uncle   . Alcohol abuse Maternal Uncle   . Cancer Maternal Uncle   . Drug abuse Maternal Uncle   . Cancer Maternal Grandmother   . Alcohol abuse Maternal Grandfather   . Cancer Maternal Grandfather     History  Substance Use Topics  . Smoking status: Never Smoker   .  Smokeless tobacco: Not on file  . Alcohol Use: 1.8 oz/week    3 Glasses of wine per week    Allergies: No Known Allergies  Prescriptions prior to admission  Medication Sig Dispense Refill  . amoxicillin-clavulanate (AUGMENTIN) 875-125 MG per tablet Take 1 tablet by mouth 2 (two) times daily.      . [DISCONTINUED] norethindrone-ethinyl estradiol (BREVICON, 28,) 0.5-35 MG-MCG tablet Take 1 tablet by mouth daily.        Review of Systems  Constitutional: Positive for malaise/fatigue. Negative for fever.  Gastrointestinal: Negative for abdominal pain.  Genitourinary:       + vaginal bleeding  Neurological: Negative for dizziness, loss of consciousness and weakness.   Physical Exam   Blood pressure 113/56, pulse 111, temperature 98.1 F (36.7 C), temperature source Oral, resp. rate 18, height 5\' 3"  (1.6 m), weight 154 lb (69.854 kg).  Physical Exam  Constitutional: She is oriented to person, place, and time. She appears well-developed and well-nourished. No distress.  HENT:  Head: Normocephalic and atraumatic.  Cardiovascular: Normal rate, regular rhythm and normal heart sounds.   Respiratory: Effort normal and breath sounds normal. No respiratory distress.  GI: Soft. She exhibits no distension and no mass. There is no tenderness. There is no rebound and no guarding.  Genitourinary: Uterus is enlarged. Uterus is not tender.  Cervix exhibits no motion tenderness, no discharge and no friability. Right adnexum displays tenderness. Right adnexum displays no mass. Left adnexum displays no mass and no tenderness. There is bleeding (small amount of active bleeding noted in the vagina and at the cervical os) around the vagina. No vaginal discharge found.  Neurological: She is alert and oriented to person, place, and time.  Skin: Skin is warm and dry. No erythema.  Psychiatric: She has a normal mood and affect.   Results for orders placed during the hospital encounter of 10/25/12 (from the past  24 hour(s))  CBC     Status: Abnormal   Collection Time    10/25/12  3:58 PM      Result Value Range   WBC 4.9  4.0 - 10.5 K/uL   RBC 3.50 (*) 3.87 - 5.11 MIL/uL   Hemoglobin 8.9 (*) 12.0 - 15.0 g/dL   HCT 40.9 (*) 81.1 - 91.4 %   MCV 78.9  78.0 - 100.0 fL   MCH 25.4 (*) 26.0 - 34.0 pg   MCHC 32.2  30.0 - 36.0 g/dL   RDW 78.2  95.6 - 21.3 %   Platelets 291  150 - 400 K/uL  WET PREP, GENITAL     Status: None   Collection Time    10/25/12  4:45 PM      Result Value Range   Yeast Wet Prep HPF POC NONE SEEN  NONE SEEN   Trich, Wet Prep NONE SEEN  NONE SEEN   Clue Cells Wet Prep HPF POC NONE SEEN  NONE SEEN   WBC, Wet Prep HPF POC NONE SEEN  NONE SEEN   US Transvaginal Non-ob  10/25/2012   *RADIOLOGY REPORT*  Clinical Data: Dysfunctional uterine bleeding.  Premenopausal female.  Uncertain LMP.  TRANSABDOMINAL AND TRANSVAGINAL ULTRASOUND OF PELVIS  Technique:  Both transabdominal and transvaginal ultrasound examinations of the pelvis were performed.  Transabdominal technique was performed for global imaging of the pelvis including uterus, ovaries, adnexal regions, and pelvic cul-de-sac.  It was necessary to proceed with endovaginal exam following the transabdominal exam to visualize the endometrium and ovaries.  Comparison:  None.  Findings: Uterus:  9.4 x 5.7 x 7.4 cm.  No fibroids identified.  Endometrium: Diffuse heterogeneous thickened endometrium is seen, with double layer thickness measuring 25 mm.  No focal lesion visualized.  Right ovary: 3.0 x 1.2 x 2.3 cm.  Normal appearance.  Left ovary: 2.7 x 1.2 x 2.2 cm.  Normal appearance.  Other Findings:  No free fluid  IMPRESSION:  1.  Abnormal endometrial thickening measuring 25 mm. If bleeding remains unresponsive to hormonal or medical therapy, focal lesion work-up with sonohysterogram should be considered.  Endometrial biopsy should also be considered in pre-menopausal patients at high risk for endometrial carcinoma. (Ref:  Radiological  Reasoning: Algorithmic Workup of Abnormal Vaginal Bleeding with Endovaginal Sonography and Sonohysterography. AJR 2008; [id number]:V78-46) 2.  No fibroids identified. 3.  Normal appearance of both ovaries.   Original Report Authenticated By: Myles Rosenthal, M.D.   US Pelvis Complete  10/25/2012   *RADIOLOGY REPORT*  Clinical Data: Dysfunctional uterine bleeding.  Premenopausal female.  Uncertain LMP.  TRANSABDOMINAL AND TRANSVAGINAL ULTRASOUND OF PELVIS  Technique:  Both transabdominal and transvaginal ultrasound examinations of the pelvis were performed.  Transabdominal technique was performed for global imaging of the pelvis including uterus, ovaries, adnexal regions, and pelvic cul-de-sac.  It was necessary to proceed with endovaginal exam following the transabdominal exam to visualize the endometrium and ovaries.  Comparison:  None.  Findings: Uterus:  9.4 x 5.7 x 7.4 cm.  No fibroids identified.  Endometrium: Diffuse heterogeneous thickened endometrium is seen, with double layer thickness measuring 25 mm.  No focal lesion visualized.  Right ovary: 3.0 x 1.2 x 2.3 cm.  Normal appearance.  Left ovary: 2.7 x 1.2 x 2.2 cm.  Normal appearance.  Other Findings:  No free fluid  IMPRESSION:  1.  Abnormal endometrial thickening measuring 25 mm. If bleeding remains unresponsive to hormonal or medical therapy, focal lesion work-up with sonohysterogram should be considered.  Endometrial biopsy should also be considered in pre-menopausal patients at high risk for endometrial carcinoma. (Ref:  Radiological Reasoning: Algorithmic Workup of Abnormal Vaginal Bleeding with Endovaginal Sonography and Sonohysterography. AJR 2008; 161:W96-04) 2.  No fibroids identified. 3.  Normal appearance of both ovaries.   Original Report Authenticated By: Myles Rosenthal, M.D.    MAU Course  Procedures None  MDM CBC, Wet prep, GC/Chlamydia and [place] today  Assessment and Plan  A: Dysfunctional uterine bleeding  P: Discharge home Rx for Megace  and Iron supplements sent to patient's pharmacy Patient will follow up in [facility] clinic for endometrial biopsy and further management/evaluation Patient may return to MAU as needed or if her condition were to change or worsen  Freddi Starr, PA-C  10/25/2012, 6:59 PM

## 2012-10-25 NOTE — MAU Note (Signed)
got put on seasonique.  6 wks of spotting, was told to stop pills to have a period, when she went off, the bleeding got very heavy - was having to change hourly.  Was changed to Brevicon last month, still spotting.  Last wk put on rx for ear infection, since taking antibiotics bleeding has gotten having and is passing clots.

## 2012-10-25 NOTE — MAU Provider Note (Signed)
Attestation of Attending Supervision of Advanced Practitioner (PA/CNM/NP): Evaluation and management procedures were performed by the Advanced Practitioner under my supervision and collaboration.  I have reviewed the Advanced Practitioner's note and chart, and I agree with the management and plan.  Jacobi Nile, MD, FACOG Attending Obstetrician & Gynecologist Faculty Practice, [facility]  

## 2012-10-26 LAB — GC/CHLAMYDIA PROBE AMP: CT Probe RNA: NEGATIVE

## 2012-11-06 ENCOUNTER — Telehealth: Payer: Self-pay | Admitting: *Deleted

## 2012-11-06 NOTE — Telephone Encounter (Signed)
Pt left message stating that she was prescribed megestrol bu [facility] and is supposed to stay on it until her clinic visit on 8/15. She needs a refill if needs to continue medication.

## 2012-11-08 ENCOUNTER — Other Ambulatory Visit: Payer: Self-pay | Admitting: Medical

## 2012-11-08 DIAGNOSIS — N938 Other specified abnormal uterine and vaginal bleeding: Secondary | ICD-10-CM

## 2012-11-08 MED ORDER — MEGESTROL ACETATE 40 MG PO TABS: 40.0000 mg | ORAL_TABLET | Freq: Every day | ORAL | Status: DC

## 2012-11-08 NOTE — Telephone Encounter (Signed)
I have sent a refill for an additional 21 pills to the patient's pharmacy. She should continue to take 1 tablet daily until her clinic visit. Thanks!

## 2012-11-08 NOTE — Telephone Encounter (Signed)
Called pt and left message that she does need to continue the Megestrol. A refill has been sent to her pharmacy for enough pills until her next clinic visit.

## 2012-11-29 ENCOUNTER — Ambulatory Visit (INDEPENDENT_AMBULATORY_CARE_PROVIDER_SITE_OTHER): Payer: Self-pay | Admitting: Obstetrics and Gynecology

## 2012-11-29 ENCOUNTER — Other Ambulatory Visit (HOSPITAL_COMMUNITY)
Admission: RE | Admit: 2012-11-29 | Discharge: 2012-11-29 | Disposition: A | Discharge status: Home / Self Care | Payer: Self-pay | Source: Ambulatory Visit | Attending: Obstetrics and Gynecology | Admitting: Obstetrics and Gynecology

## 2012-11-29 ENCOUNTER — Encounter: Payer: Self-pay | Admitting: Obstetrics and Gynecology

## 2012-11-29 VITALS — BP 114/73 | HR 76 | Temp 97.2°F | Ht 64.0 in | Wt 155.4 lb

## 2012-11-29 DIAGNOSIS — N949 Unspecified condition associated with female genital organs and menstrual cycle: Secondary | ICD-10-CM | POA: Insufficient documentation

## 2012-11-29 DIAGNOSIS — N938 Other specified abnormal uterine and vaginal bleeding: Secondary | ICD-10-CM | POA: Insufficient documentation

## 2012-11-29 DIAGNOSIS — Z01812 Encounter for preprocedural laboratory examination: Secondary | ICD-10-CM

## 2012-11-29 DIAGNOSIS — N939 Abnormal uterine and vaginal bleeding, unspecified: Secondary | ICD-10-CM | POA: Insufficient documentation

## 2012-11-29 LAB — POCT PREGNANCY, URINE: Preg Test, Ur: NEGATIVE

## 2012-11-29 MED ORDER — MEGESTROL ACETATE 40 MG PO TABS: ORAL_TABLET | ORAL | Status: DC

## 2012-11-29 NOTE — Progress Notes (Signed)
Subjective:    Patient ID: Danielle Reilly, female    DOB: 1975/09/29, 37 y.o.   MRN: [id number]  HPI 37 yo [id number] presenting today for evaluation oaf abnormal uterine bleeding. Patient was initially taking seasonique birth control pill but experienced bleeding while taking it. The bleeding was at times spotting but other time heavy like a period. She was then changed to a monophasic type of birth control which made the bleeding worst. Patient was seen in MAU in late July where a pelvic ultrasound demonstrated a thickened endometrium of 2.5 cm. Patient was started on Megace which initially stopped the bleeding but she now started spotting again. Patient is otherwise without complaints.   History reviewed. No pertinent past medical history. Past Surgical History  Procedure Laterality Date  . Laparoscopy for ectopic pregnancy    . Laparoscopy  12/25/2010    Procedure: LAPAROSCOPY OPERATIVE;  Surgeon: Roseanna Rainbow, MD;  Location: [facility];  Service: Gynecology;  Laterality: N/A;  Operative laparoscopy /cauterization of right hemorrhagic cyst wall. Removal of belly rings.   Family History  Problem Relation Age of Onset  . Drug abuse Mother   . Arthritis Maternal Aunt   . Cancer Maternal Aunt   . Drug abuse Maternal Aunt   . Arthritis Maternal Uncle   . Alcohol abuse Maternal Uncle   . Cancer Maternal Uncle   . Drug abuse Maternal Uncle   . Cancer Maternal Grandmother   . Alcohol abuse Maternal Grandfather   . Cancer Maternal Grandfather    History  Substance Use Topics  . Smoking status: Never Smoker   . Smokeless tobacco: Not on file  . Alcohol Use: 1.8 oz/week    3 Glasses of wine per week      Review of Systems  All other systems reviewed and are negative.       Objective:   Physical Exam GENERAL: Well-developed, well-nourished female in no acute distress.  ABDOMEN: Soft, nontender, nondistended. No organomegaly. PELVIC: Normal external female genitalia. Vagina is pink  and rugated.  Normal discharge. Normal appearing cervix. Uterus is normal in size. No adnexal mass or tenderness. EXTREMITIES: No cyanosis, clubbing, or edema, 2+ distal pulses.  7/11 ultrasound: Findings:  Uterus: 9.4 x 5.7 x 7.4 cm. No fibroids identified.  Endometrium: Diffuse heterogeneous thickened endometrium is seen,  with double layer thickness measuring 25 mm. No focal lesion  visualized.  Right ovary: 3.0 x 1.2 x 2.3 cm. Normal appearance.  Left ovary: 2.7 x 1.2 x 2.2 cm. Normal appearance.  Other Findings: No free fluid  IMPRESSION:  1. Abnormal endometrial thickening measuring 25 mm. If bleeding  remains unresponsive to hormonal or medical therapy, focal lesion  work-up with sonohysterogram should be considered. Endometrial  biopsy should also be considered in pre-menopausal patients at high  risk for endometrial carcinoma. (Ref: Radiological Reasoning:  Algorithmic Workup of Abnormal Vaginal Bleeding with Endovaginal  Sonography and Sonohysterography. AJR 2008; 147:W29-56)  2. No fibroids identified.  3. Normal appearance of both ovaries.  Original Report Authenticated By: Myles Rosenthal, M.D.       Assessment & Plan:  37 yo with abnormal uterine bleeding - Discussed the need for endometrial biopsy ENDOMETRIAL BIOPSY     The indications for endometrial biopsy were reviewed.   Risks of the biopsy including cramping, bleeding, infection, uterine perforation, inadequate specimen and need for additional procedures  were discussed. The patient states she understands and agrees to undergo procedure today. Consent was signed. Time out was performed. Urine  HCG was negative. A sterile speculum was placed in the patient's vagina and the cervix was prepped with Betadine. A single-toothed tenaculum was placed on the anterior lip of the cervix to stabilize it. The uterine cavity was sounded to a depth of 7 cm using the uterine sound. The 3 mm pipelle was introduced into the endometrial  cavity without difficulty, 2 passes were made.  A  moderate amount of tissue was  sent to pathology. The instruments were removed from the patient's vagina. Minimal bleeding from the cervix was noted. The patient tolerated the procedure well.  Routine post-procedure instructions were given to the patient. The patient will follow up in two weeks to review the results and for further management.   - Rx megace refilled - RTC in 2 weeks to discuss results and further management - Patient interested in Mirena IUD for contraception and management of menorrhagia. Will have her complete [employer] form

## 2012-12-03 ENCOUNTER — Telehealth: Payer: Self-pay | Admitting: General Practice

## 2012-12-03 NOTE — Telephone Encounter (Signed)
Patient called and left message stating she was in our office on 8/15 and had an endometrial biopsy done and was given a Rx for megace, which she has already been taking for a month but since the biopsy her stomach has been hurting bad and is she spotting heavier and would like a call back. Called patient, no answer- left message that we are returning her phone call and to call us back at the clinics

## 2012-12-04 NOTE — Telephone Encounter (Signed)
Called patient back stating I was returning her phone call from yesterday, and that some mild cramping and spotting is normal after the procedure, but severe pain and heavy bleeding is not. Patient verbalized understanding and stated it is only light bleeding and mild cramps and that she would continue taking the megace. Patient had no further questions

## 2012-12-10 ENCOUNTER — Telehealth: Payer: Self-pay | Admitting: *Deleted

## 2012-12-10 DIAGNOSIS — N938 Other specified abnormal uterine and vaginal bleeding: Secondary | ICD-10-CM

## 2012-12-10 NOTE — Telephone Encounter (Addendum)
Pt left message stating that she had biopsy and has been bleeding ever since. She wants to know if this is normal and what she can do about it.  I called pt and left message that I am calling to discuss her concerns. Please call back if she would still like to speak to a nurse and state whether we can leave detailed information on her voice mail.  Pt called back later and gave permission to leave detailed message on her voice mail. I called her @ 1605 today and discussed her concerns. I asked how often she is changing a pad or tampon and she stated about every 2 hrs. I asked if she had followed the regimen for megace as ordered on 8/15. Pt then stated that she has only been taking 2 pills daily and did not realize that she was to take a tapered dose beginning on 8/15. I stated to pt that I will clarify with Dr. Jolayne Panther tomorrow and call her back. I also informed pt of normal endometrial bx results and that she has been approved for a free Mirena IUD if she desires. Pt voiced understanding of all information.

## 2012-12-16 ENCOUNTER — Encounter (HOSPITAL_COMMUNITY): Payer: Self-pay | Admitting: *Deleted

## 2012-12-16 ENCOUNTER — Inpatient Hospital Stay (HOSPITAL_COMMUNITY)
Admission: AD | Admit: 2012-12-16 | Discharge: 2012-12-16 | Disposition: A | Discharge status: Home / Self Care | Payer: Self-pay | Source: Ambulatory Visit | Attending: Obstetrics & Gynecology | Admitting: Obstetrics & Gynecology

## 2012-12-16 DIAGNOSIS — N938 Other specified abnormal uterine and vaginal bleeding: Secondary | ICD-10-CM | POA: Insufficient documentation

## 2012-12-16 DIAGNOSIS — N926 Irregular menstruation, unspecified: Secondary | ICD-10-CM

## 2012-12-16 DIAGNOSIS — N939 Abnormal uterine and vaginal bleeding, unspecified: Secondary | ICD-10-CM

## 2012-12-16 DIAGNOSIS — N949 Unspecified condition associated with female genital organs and menstrual cycle: Secondary | ICD-10-CM | POA: Insufficient documentation

## 2012-12-16 LAB — CBC
HCT: 28.9 % — ABNORMAL LOW (ref 36.0–46.0)
Hemoglobin: 9.1 g/dL — ABNORMAL LOW (ref 12.0–15.0)
MCH: 23.4 pg — ABNORMAL LOW (ref 26.0–34.0)
RBC: 3.89 MIL/uL (ref 3.87–5.11)

## 2012-12-16 LAB — WET PREP, GENITAL
Trich, Wet Prep: NONE SEEN
WBC, Wet Prep HPF POC: NONE SEEN
Yeast Wet Prep HPF POC: NONE SEEN

## 2012-12-16 LAB — URINE MICROSCOPIC-ADD ON

## 2012-12-16 LAB — URINALYSIS, ROUTINE W REFLEX MICROSCOPIC
Glucose, UA: NEGATIVE mg/dL
Ketones, ur: NEGATIVE mg/dL
Leukocytes, UA: NEGATIVE
Nitrite: NEGATIVE
Protein, ur: NEGATIVE mg/dL

## 2012-12-16 LAB — POCT PREGNANCY, URINE: Preg Test, Ur: NEGATIVE

## 2012-12-16 MED ORDER — MEGESTROL ACETATE 40 MG PO TABS: 40.0000 mg | ORAL_TABLET | Freq: Two times a day (BID) | ORAL | Status: DC

## 2012-12-16 NOTE — MAU Note (Addendum)
PT SAYS HER LMP- 07-19-2012.HAS HAD IRREG BLEEDING   BRIGHT RED   SINCE.   SHE GETS CARE IN GYN CLINIC.   STARTED ON MEGACE IN July.   ON 8-15- HAD UTERINE BIOPSY- DR   CONSTANT -  NEG.   GAVE MORE MEGACE  BUT STILL CONTINUES TO SPOT.    IN TRIAGE - HAS MINI- PAD ON -    VERY SMALL AMT LIGHT RED.     SOMETIMES  HAS CRAMPS-  NONE NOW.  LAST SEX- June.       USES CONDOMS  FOR BIRTH CONTROL

## 2012-12-16 NOTE — MAU Provider Note (Signed)
  History     CSN: [id number]  Arrival date and time: 12/16/12 0035   None     No chief complaint on file.  HPI Comments: Danielle Reilly 37 y.o. [phone] comes to MAU today for ongoing problems with vaginal bleeding. She had a uterine biopsy on 8/15 that was normal and since then has continued to bleed. She has been on a taper down dose of Megace and now is at 40 mg BID. She has been advised to have the [place] IUD and actually has an appointment in Sept for that procedure.        History reviewed. No pertinent past medical history.  Past Surgical History  Procedure Laterality Date  . Laparoscopy for ectopic pregnancy    . Laparoscopy  12/25/2010    Procedure: LAPAROSCOPY OPERATIVE;  Surgeon: Roseanna Rainbow, MD;  Location: [facility];  Service: Gynecology;  Laterality: N/A;  Operative laparoscopy /cauterization of right hemorrhagic cyst wall. Removal of belly rings.  . Uterine biopsy      Family History  Problem Relation Age of Onset  . Drug abuse Mother   . Arthritis Maternal Aunt   . Cancer Maternal Aunt   . Drug abuse Maternal Aunt   . Arthritis Maternal Uncle   . Alcohol abuse Maternal Uncle   . Cancer Maternal Uncle   . Drug abuse Maternal Uncle   . Cancer Maternal Grandmother   . Alcohol abuse Maternal Grandfather   . Cancer Maternal Grandfather     History  Substance Use Topics  . Smoking status: Never Smoker   . Smokeless tobacco: Not on file  . Alcohol Use: 1.8 oz/week    3 Glasses of wine per week    Allergies: No Known Allergies  Prescriptions prior to admission  Medication Sig Dispense Refill  . megestrol (MEGACE) 40 MG tablet Take 1 tablet (40 mg total) by mouth daily.  21 tablet  0  . megestrol (MEGACE) 40 MG tablet Take 2 tablets (40 mg) 3 x per day x 3 days,  then take 2 tablets (40 mg) 2x per day x 3 days,  then take 2 tablets (40 mg)once per day  60 tablet  0  . Multiple Vitamin (MULTIVITAMIN) tablet Take 1 tablet by mouth daily.      . ferrous  sulfate 325 (65 FE) MG tablet Take 1 tablet (325 mg total) by mouth daily.  30 tablet  3    Review of Systems  Constitutional: Negative.   HENT: Negative.   Eyes: Negative.   Respiratory: Negative.   Cardiovascular: Negative.   Gastrointestinal: Negative.   Genitourinary: Negative.        Vaginal bleeding  Musculoskeletal: Negative.   Skin: Negative.   Neurological: Negative.   Psychiatric/Behavioral: Negative.    Physical Exam   Blood pressure 112/63, pulse 86, temperature 97.8 F (36.6 C), temperature source Oral, resp. rate 20, height 5' 3.5" (1.613 m), weight 156 lb 8 oz (70.988 kg), last menstrual period 07/19/2012.  Physical Exam  Constitutional: She appears well-developed and well-nourished.  HENT:  Head: Normocephalic and atraumatic.  Cardiovascular: Normal rate and regular rhythm.   GI: Soft. Bowel sounds are normal.  Genitourinary:  Small amount blood in vault, cervix without lesions, biman negative    MAU Course  Procedures  MDM CBC  Assessment and Plan  A: Abnormal uterine bleeding P: Megace 40 mg BID # 60/ NR Keep IUD appointment  Carolynn Serve 12/16/2012, 1:52 AM

## 2012-12-17 LAB — GC/CHLAMYDIA PROBE AMP: CT Probe RNA: NEGATIVE

## 2012-12-17 MED ORDER — MEGESTROL ACETATE 40 MG PO TABS: 120.0000 mg | ORAL_TABLET | Freq: Every day | ORAL | Status: DC

## 2012-12-17 NOTE — Telephone Encounter (Signed)
Called patient back stating I was returning her phone call. Patient stated she has been taking the Megace 80mg  daily and it isn't helping she is still having spotting and she also cannot make her appt on 9/12 because she is working that day. Per Dr Macon Large, patient may increase Megace to 120mg  daily. Informed patient of new Rx and medication being sent to her pharmacy. Patient verbalized understanding. Informed patient that by changing her appt it may not be until the end of the month before she can be seen. Patient verbalized understanding and stated that was fine and had no further questions. Transferred patient to front office staff, new appt of 10/9 was made.

## 2012-12-17 NOTE — Telephone Encounter (Signed)
Patient called and left message stating she had on a endometrial biopsy on 8/15 and has been taking the megace as directed and is still having spotting and doesn't know why and would like to know what she needs to do.

## 2012-12-27 ENCOUNTER — Ambulatory Visit: Payer: Self-pay | Admitting: Obstetrics and Gynecology

## 2013-01-04 ENCOUNTER — Inpatient Hospital Stay (HOSPITAL_COMMUNITY)
Admission: AD | Admit: 2013-01-04 | Discharge: 2013-01-05 | Disposition: A | Discharge status: Home / Self Care | Payer: Medicaid Other | Source: Ambulatory Visit | Attending: Obstetrics & Gynecology | Admitting: Obstetrics & Gynecology

## 2013-01-04 ENCOUNTER — Encounter (HOSPITAL_COMMUNITY): Payer: Self-pay | Admitting: *Deleted

## 2013-01-04 DIAGNOSIS — N949 Unspecified condition associated with female genital organs and menstrual cycle: Secondary | ICD-10-CM

## 2013-01-04 DIAGNOSIS — N938 Other specified abnormal uterine and vaginal bleeding: Secondary | ICD-10-CM | POA: Insufficient documentation

## 2013-01-04 LAB — CBC
HCT: 28.4 % — ABNORMAL LOW (ref 36.0–46.0)
MCHC: 31.7 g/dL (ref 30.0–36.0)
MCV: 72.6 fL — ABNORMAL LOW (ref 78.0–100.0)
RDW: 16 % — ABNORMAL HIGH (ref 11.5–15.5)

## 2013-01-04 LAB — URINE MICROSCOPIC-ADD ON

## 2013-01-04 LAB — URINALYSIS, ROUTINE W REFLEX MICROSCOPIC
Bilirubin Urine: NEGATIVE
Protein, ur: NEGATIVE mg/dL
Urobilinogen, UA: 0.2 mg/dL (ref 0.0–1.0)

## 2013-01-04 MED ORDER — IBUPROFEN 600 MG PO TABS
600.0000 mg | ORAL_TABLET | Freq: Once | ORAL | Status: AC
  Administered 2013-01-04: 600 mg via ORAL
  Filled 2013-01-04: qty 1

## 2013-01-04 MED ORDER — IBUPROFEN 400 MG PO TABS: 400.0000 mg | ORAL_TABLET | Freq: Once | ORAL | Status: DC

## 2013-01-04 MED ORDER — IBUPROFEN 600 MG PO TABS: 600.0000 mg | ORAL_TABLET | Freq: Four times a day (QID) | ORAL | Status: DC | PRN

## 2013-01-04 NOTE — MAU Provider Note (Signed)
History     CSN: [id number]  Arrival date and time: 01/04/13 2215   First Provider Initiated Contact with Patient 01/04/13 2307      Chief Complaint  Patient presents with  . Vaginal Bleeding   HPI This is a 37 y.o. female who presents with persistent heavy vaginal bleeding. Has had irregular bleeding since April, treated with OCPs and then megace. States had decreased bleeding a few times but then came back heavier. Tired of waiting for resolution. Has appt for Mirena but is afraid it won't work.   RN Note: I've had irreg bleeding since April. Put on Megace and then had uterine biopsy in Aug. Today having heavy bleeding and i'm tired of this. I'm still on Megace and taking it like i'm suppose to       OB History   Grav Para Term Preterm Abortions TAB SAB Ect Mult Living   4 1 1  3 2 1  0  1      History reviewed. No pertinent past medical history.  Past Surgical History  Procedure Laterality Date  . Laparoscopy for ectopic pregnancy    . Laparoscopy  12/25/2010    Procedure: LAPAROSCOPY OPERATIVE;  Surgeon: Roseanna Rainbow, MD;  Location: [facility];  Service: Gynecology;  Laterality: N/A;  Operative laparoscopy /cauterization of right hemorrhagic cyst wall. Removal of belly rings.  . Uterine biopsy      Family History  Problem Relation Age of Onset  . Drug abuse Mother   . Arthritis Maternal Aunt   . Cancer Maternal Aunt   . Drug abuse Maternal Aunt   . Arthritis Maternal Uncle   . Alcohol abuse Maternal Uncle   . Cancer Maternal Uncle   . Drug abuse Maternal Uncle   . Cancer Maternal Grandmother   . Alcohol abuse Maternal Grandfather   . Cancer Maternal Grandfather     History  Substance Use Topics  . Smoking status: Never Smoker   . Smokeless tobacco: Not on file  . Alcohol Use: 1.8 oz/week    3 Glasses of wine per week    Allergies: No Known Allergies  Prescriptions prior to admission  Medication Sig Dispense Refill  . megestrol (MEGACE) 40 MG  tablet Take 2 tablets (40 mg) 3 x per day x 3 days,  then take 2 tablets (40 mg) 2x per day x 3 days,  then take 2 tablets (40 mg)once per day  60 tablet  0  . ferrous sulfate 325 (65 FE) MG tablet Take 1 tablet (325 mg total) by mouth daily.  30 tablet  3  . megestrol (MEGACE) 40 MG tablet Take 3 tablets (120 mg total) by mouth daily.  60 tablet  0  . Multiple Vitamin (MULTIVITAMIN) tablet Take 1 tablet by mouth daily.        Review of Systems  Constitutional: Negative for fever, chills and malaise/fatigue.  Gastrointestinal: Positive for abdominal pain. Negative for nausea, vomiting, diarrhea and constipation.  Genitourinary:       Heavy bleeding   Neurological: Negative for dizziness and weakness.   Physical Exam   Blood pressure 124/70, pulse 61, temperature 97.8 F (36.6 C), resp. rate 20, height 5\' 3"  (1.6 m), weight 72.213 kg (159 lb 3.2 oz).  Physical Exam  Constitutional: She is oriented to person, place, and time. She appears well-developed and well-nourished. No distress (but tearful).  Cardiovascular: Normal rate.   Respiratory: Effort normal.  GI: Soft. She exhibits no distension and no mass. There  is tenderness (over uterus). There is no rebound and no guarding.  Genitourinary: Uterus normal. Vaginal discharge (mod blood) found.  Musculoskeletal: Normal range of motion.  Neurological: She is alert and oriented to person, place, and time.  Skin: Skin is warm and dry.  Psychiatric: She has a normal mood and affect.    MAU Course  Procedures  MDM Results for orders placed during the hospital encounter of 01/04/13 (from the past 72 hour(s))  URINALYSIS, ROUTINE W REFLEX MICROSCOPIC     Status: Abnormal   Collection Time    01/04/13 10:30 PM      Result Value Range   Color, Urine YELLOW  YELLOW   APPearance CLEAR  CLEAR   Specific Gravity, Urine >1.030 (*) 1.005 - 1.030   pH 6.0  5.0 - 8.0   Glucose, UA NEGATIVE  NEGATIVE mg/dL   Hgb urine dipstick MODERATE (*)  NEGATIVE   Bilirubin Urine NEGATIVE  NEGATIVE   Ketones, ur NEGATIVE  NEGATIVE mg/dL   Protein, ur NEGATIVE  NEGATIVE mg/dL   Urobilinogen, UA 0.2  0.0 - 1.0 mg/dL   Nitrite NEGATIVE  NEGATIVE   Leukocytes, UA NEGATIVE  NEGATIVE  URINE MICROSCOPIC-ADD ON     Status: Abnormal   Collection Time    01/04/13 10:30 PM      Result Value Range   Squamous Epithelial / LPF FEW (*) RARE   WBC, UA 3-6  <3 WBC/hpf   RBC / HPF 3-6  <3 RBC/hpf   Bacteria, UA FEW (*) RARE   Urine-Other MUCOUS PRESENT    POCT PREGNANCY, URINE     Status: None   Collection Time    01/04/13 10:37 PM      Result Value Range   Preg Test, Ur NEGATIVE  NEGATIVE   Comment:            THE SENSITIVITY OF THIS     METHODOLOGY IS >24 mIU/mL  CBC     Status: Abnormal   Collection Time    01/04/13 11:06 PM      Result Value Range   WBC 6.6  4.0 - 10.5 K/uL   RBC 3.91  3.87 - 5.11 MIL/uL   Hemoglobin 9.0 (*) 12.0 - 15.0 g/dL   HCT 16.1 (*) 09.6 - 04.5 %   MCV 72.6 (*) 78.0 - 100.0 fL   MCH 23.0 (*) 26.0 - 34.0 pg   MCHC 31.7  30.0 - 36.0 g/dL   RDW 40.9 (*) 81.1 - 91.4 %   Platelets 331  150 - 400 K/uL     Assessment and Plan  A:  Dysfunctional Uterine bleeding      Could be endometrial polyp  P:  Discussed with Dr Debroah Loop       Per radiology, pt may need an sonohystogram to r/o polyp       Will send message to clinic to set one up       Continue Megace       Will add Ibuprofen   [facility] 01/04/2013, 11:45 PM

## 2013-01-04 NOTE — MAU Note (Signed)
I've had irreg bleeding since April. Put on Megace and then had uterine biopsy in Aug. Today having heavy bleeding and i'm tired of this. I'm still on Megace and taking it like i'm suppose to

## 2013-01-04 NOTE — Progress Notes (Signed)
Spec exam done.

## 2013-01-05 NOTE — Progress Notes (Signed)
Wynelle Bourgeois CNM in earlier and lab results and d/c plan discussed. Written and verbal d/c instructions given and understanding voiced

## 2013-01-06 LAB — URINE CULTURE: Colony Count: 9000

## 2013-01-07 ENCOUNTER — Telehealth: Payer: Self-pay | Admitting: General Practice

## 2013-01-07 DIAGNOSIS — N938 Other specified abnormal uterine and vaginal bleeding: Secondary | ICD-10-CM

## 2013-01-07 NOTE — Telephone Encounter (Signed)
Patient called back to front office and stated there is not a day when she is not bleeding at all but right now it is on the lighter side. Called radiology and made appt for 10/1 @ 1030. Confirmed appt with patient, patient verbalized understanding and had no further questions

## 2013-01-07 NOTE — Telephone Encounter (Signed)
Message copied by Kathee Delton on Tue Jan 07, 2013 11:27 AM ------      Message from: Aviva Signs      Created: Sat Jan 04, 2013 10:58 PM      Regarding: need to order sonohystogram       Heavy bleeding not being helped by long term Megace      Scheduled for IUD 10/9            Per Dr Debroah Loop, needs Sonohystogram to rule out endometrial polyp            Please get it scheduled before IUD, may end up needing hysteroscopy/D&C ------

## 2013-01-07 NOTE — Telephone Encounter (Signed)
Called and spoke with radiology, sonohystogram is usually done day 7-10 of cycle and due to patient's irregular heavy bleeding this will be difficult to do. Patient needs to be informed to call radiology the day she stops bleeding so they can work her in for this. Called patient, no answer- left message that we are trying to get in touch with you to make an appt, please call us back.

## 2013-01-13 ENCOUNTER — Ambulatory Visit: Payer: Self-pay | Admitting: Obstetrics and Gynecology

## 2013-01-15 ENCOUNTER — Ambulatory Visit (HOSPITAL_COMMUNITY)
Admission: RE | Admit: 2013-01-15 | Discharge: 2013-01-15 | Disposition: A | Discharge status: Home / Self Care | Payer: No Typology Code available for payment source | Source: Ambulatory Visit | Attending: Advanced Practice Midwife | Admitting: Advanced Practice Midwife

## 2013-01-15 ENCOUNTER — Encounter: Payer: Self-pay | Admitting: Advanced Practice Midwife

## 2013-01-15 DIAGNOSIS — N949 Unspecified condition associated with female genital organs and menstrual cycle: Secondary | ICD-10-CM | POA: Insufficient documentation

## 2013-01-15 DIAGNOSIS — N938 Other specified abnormal uterine and vaginal bleeding: Secondary | ICD-10-CM | POA: Insufficient documentation

## 2013-01-15 DIAGNOSIS — N9489 Other specified conditions associated with female genital organs and menstrual cycle: Secondary | ICD-10-CM | POA: Insufficient documentation

## 2013-01-16 ENCOUNTER — Telehealth: Payer: Self-pay | Admitting: *Deleted

## 2013-01-16 DIAGNOSIS — N938 Other specified abnormal uterine and vaginal bleeding: Secondary | ICD-10-CM

## 2013-01-16 MED ORDER — MEGESTROL ACETATE 40 MG PO TABS: 40.0000 mg | ORAL_TABLET | Freq: Two times a day (BID) | ORAL | Status: DC

## 2013-01-16 NOTE — Telephone Encounter (Signed)
Patient left message that she will be out of her megace on Sunday. Her followup appt is on 10/9. Pt would like a refill.

## 2013-01-16 NOTE — Telephone Encounter (Signed)
Refill ordered. Please tell patient to pick up Rx.

## 2013-01-17 NOTE — Telephone Encounter (Signed)
Spoke with patient. She stated that the people that did her ultrasound told her that she needs surgery. Per Dr. Macon Large pt should keep appt Thursday and will still be getting the IUD. Pt informed.

## 2013-01-23 ENCOUNTER — Ambulatory Visit (INDEPENDENT_AMBULATORY_CARE_PROVIDER_SITE_OTHER): Payer: Medicaid Other | Admitting: Obstetrics & Gynecology

## 2013-01-23 ENCOUNTER — Encounter: Payer: Self-pay | Admitting: Obstetrics & Gynecology

## 2013-01-23 VITALS — BP 109/68 | HR 76 | Temp 99.1°F | Ht 64.0 in | Wt 164.7 lb

## 2013-01-23 DIAGNOSIS — N920 Excessive and frequent menstruation with regular cycle: Secondary | ICD-10-CM

## 2013-01-23 NOTE — Progress Notes (Signed)
Subjective:     Patient ID: Danielle Reilly, female   DOB: November 17, 1975, 37 y.o.   MRN: [id number]  HPI [id number].  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   Pt reports frustration at having bleeding since April.  She does not want a Mirena.  She wants definitive treatment with a hysteroscopy and removal of the polyp.  She is not interested in preservation of fertility.  She does not want to use OCP's after the procedure.  She wants to resume condom use which she say she has been successful for her for years. Pt on Megace and she c/o weight gain.   Review of Systems     Objective:   Physical Exam BP 109/68  Pulse 76  Temp(Src) 99.1 F (37.3 C) (Oral)  Ht 5\' 4"  (1.626 m)  Wt 164 lb 11.2 oz (74.707 kg)  BMI 28.26 kg/m2  LMP 08/15/2012 Exam deferred  10/25/2012 Findings:  Uterus: 9.4 x 5.7 x 7.4 cm. No fibroids identified.  Endometrium: Diffuse heterogeneous thickened endometrium is seen,  with double layer thickness measuring 25 mm. No focal lesion  visualized.  Right ovary: 3.0 x 1.2 x 2.3 cm. Normal appearance.  Left ovary: 2.7 x 1.2 x 2.2 cm. Normal appearance.  Other Findings: No free fluid  IMPRESSION:  1. Abnormal endometrial thickening measuring 25 mm. If bleeding  remains unresponsive to hormonal or medical therapy, focal lesion  work-up with sonohysterogram should be considered. Endometrial  biopsy should also be considered in pre-menopausal patients at high  risk for endometrial carcinoma. (Ref: Radiological Reasoning:  Algorithmic Workup of Abnormal Vaginal Bleeding with Endovaginal  Sonography and Sonohysterography. AJR 2008; 147:W29-56)  2. No fibroids identified.  3. Normal appearance of both ovaries.  11/29/2012 Diagnosis Endometrium, biopsy, uterus - SCANT FRAGMENTS OF BENIGN ENDOMETRIAL GLANDS AND STROMA; NEGATIVE FOR HYPERPLASIA OR MALIGNANCY. - BENIGN ENDOCERVICAL MUCOSA  01/15/2013 Clinical Data: DUB with prior ultrasound suggesting  abnormal  endometrial thickening.  [place] SONOHYSTEROGRAM  Technique: Following cleansing of the cervix and vagina with  Betadine, a hysterosalpingogram catheter was placed within the  endocervical canal. Sonohysterogram was then performed with  transvaginal sonography during infusion of sterile saline solution  into the endometrial cavity.  Comparison: Ultrasound pelvis 10/25/2012  Findings: The uterus measures 8.2 x 2.9 x 6.8 cm. After infusion  of saline there is a 4.8 x 2.5 x 4.2 cm solid appearing isoechoic  mass within the endometrium which appears to be arising from the  anterior wall as fluid is demonstrated along the posterior and  fundal margins of this mass. No definite internal color blood flow  is able to be visualized. The myometrium is grossly unremarkable  in appearance. The ovaries are normal bilaterally.  IMPRESSION:  A large endometrial mass which is non-dependent and appears  attached to the anterior uterine wall. This is suggestive of a  large polyp or less likely a fibroid. Given the age, malignancy is  considered less likely however not entirely excluded. Further  evaluation with GYN consultation and possible surgical evaluation  is recommended.      Assessment:     menorrhagia d/w pt treatment options including continued Megace, hysteroscopy with polypectomy with or without endometrial ablation or IUD.  Reviewed  Risks and alternatives of each option.    Plan:     Patient desires surgical management with hysteroscopy with polypectomy and endometrial ablation using Novasure.  The risks of surgery were discussed in detail with  the patient including but not limited to: bleeding which may require transfusion or reoperation; infection which may require prolonged hospitalization or re-hospitalization and antibiotic therapy; injury to bowel, bladder, ureters and major vessels or other surrounding organs; need for additional procedures including laparotomy; thromboembolic  phenomenon, incisional problems and other postoperative or anesthesia complications.  Patient was told that the likelihood that her condition and symptoms will be treated effectively with this surgical management was very high; the postoperative expectations were also discussed in detail. The patient also understands the alternative treatment options which were discussed in full. All questions were answered.  She was told that she will be contacted by our surgical scheduler regarding the time and date of her surgery; routine preoperative instructions of having nothing to eat or drink after midnight on the day prior to surgery and also coming to the hospital 1 1/2 hours prior to her time of surgery were also emphasized.  She was told she may be called for a preoperative appointment about a week prior to surgery and will be given further preoperative instructions at that visit. Printed patient education handouts about the procedure were given to the patient to review at home.

## 2013-01-23 NOTE — Patient Instructions (Signed)
Endometrial Ablation Endometrial ablation removes the lining of the uterus (endometrium). It is usually a same day, outpatient treatment. Ablation helps avoid major surgery (such as a hysterectomy). A hysterectomy is removal of the cervix and uterus. Endometrial ablation has less risk and complications, has a shorter recovery period and is less expensive. After endometrial ablation, most women will have little or no menstrual bleeding. You may not keep your fertility. Pregnancy is no longer likely after this procedure but if you are pre-menopausal, you still need to use a reliable method of birth control following the procedure because pregnancy can occur. REASONS TO HAVE THE PROCEDURE MAY INCLUDE:  Heavy periods.  Bleeding that is causing anemia.  Anovulatory bleeding, very irregular, bleeding.  Bleeding submucous fibroids (on the lining inside the uterus) if they are smaller than 3 centimeters. REASONS NOT TO HAVE THE PROCEDURE MAY INCLUDE:  You wish to have more children.  You have a pre-cancerous or cancerous problem. The cause of any abnormal bleeding must be diagnosed before having the procedure.  You have pain coming from the uterus.  You have a submucus fibroid larger than 3 centimeters.  You recently had a baby.  You recently had an infection in the uterus.  You have a severe retro-flexed, tipped uterus and cannot insert the instrument to do the ablation.  You had a Cesarean section or deep major surgery on the uterus.  The inner cavity of the uterus is too large for the endometrial ablation instrument. RISKS AND COMPLICATIONS   Perforation of the uterus.  Bleeding.  Infection of the uterus, bladder or vagina.  Injury to surrounding organs.  Cutting the cervix.  An air bubble to the lung (air embolus).  Pregnancy following the procedure.  Failure of the procedure to help the problem requiring hysterectomy.  Decreased ability to diagnose cancer in the lining of  the uterus. BEFORE THE PROCEDURE  The lining of the uterus must be tested to make sure there is no pre-cancerous or cancer cells present.  Medications may be given to make the lining of the uterus thinner.  Ultrasound may be used to evaluate the size and look for abnormalities of the uterus.  Future pregnancy is not desired. PROCEDURE  There are different ways to destroy the lining of the uterus.   Resectoscope - radio frequency-alternating electric current is the most common one used.  Cryotherapy - freezing the lining of the uterus.  Heated Free Liquid - heated salt (saline) solution inserted into the uterus.  Microwave - uses high energy microwaves in the uterus.  Thermal Balloon - a catheter with a balloon tip is inserted into the uterus and filled with heated fluid. Your caregiver will talk with you about the method used in this clinic. They will also instruct you on the pros and cons of the procedure. Endometrial ablation is performed along with a procedure called operative hysteroscopy. A narrow viewing tube is inserted through the birth canal (vagina) and through the cervix into the uterus. A tiny camera attached to the viewing tube (hysteroscope) allows the uterine cavity to be shown on a TV monitor during surgery. Your uterus is filled with a harmless liquid to make the procedure easier. The lining of the uterus is then removed. The lining can also be removed with a resectoscope which allows your surgeon to cut away the lining of the uterus under direct vision. Usually, you will be able to go home within an hour after the procedure. HOME CARE INSTRUCTIONS   Do   not drive for 24 hours.  No tampons, douching or intercourse for 2 weeks or until your caregiver approves.  Rest at home for 24 to 48 hours. You may then resume normal activities unless told differently by your caregiver.  Take your temperature two times a day for 4 days, and record it.  Take any medications your  caregiver has ordered, as directed.  Use some form of contraception if you are pre-menopausal and do not want to get pregnant. Bleeding after the procedure is normal. It varies from light spotting and mildly watery to bloody discharge for 4 to 6 weeks. You may also have mild cramping. Only take over-the-counter or prescription medicines for pain, discomfort, or fever as directed by your caregiver. Do not use aspirin, as this may aggravate bleeding. Frequent urination during the first 24 hours is normal. You will not know how effective your surgery is until at least 3 months after the surgery. SEEK IMMEDIATE MEDICAL CARE IF:   Bleeding is heavier than a normal menstrual cycle.  An oral temperature above 102 F (38.9 C) develops.  You have increasing cramps or pains not relieved with medication or develop belly (abdominal) pain which does not seem to be related to the same area of earlier cramping and pain.  You are light headed, weak or have fainting episodes.  You develop pain in the shoulder strap areas.  You have chest or leg pain.  You have abnormal vaginal discharge.  You have painful urination. Document Released: 02/11/2004 Document Revised: 06/26/2011 Document Reviewed: 05/11/2007 [employer] Patient Information 2014 [employer].  

## 2013-01-27 ENCOUNTER — Ambulatory Visit: Payer: No Typology Code available for payment source | Attending: Internal Medicine

## 2013-01-29 ENCOUNTER — Ambulatory Visit: Payer: No Typology Code available for payment source | Attending: Internal Medicine | Admitting: Internal Medicine

## 2013-01-29 ENCOUNTER — Encounter: Payer: Self-pay | Admitting: Internal Medicine

## 2013-01-29 VITALS — BP 111/69 | HR 90 | Temp 98.5°F | Resp 18 | Ht 65.0 in | Wt 164.0 lb

## 2013-01-29 DIAGNOSIS — K089 Disorder of teeth and supporting structures, unspecified: Secondary | ICD-10-CM

## 2013-01-29 DIAGNOSIS — K0500 Acute gingivitis, plaque induced: Secondary | ICD-10-CM

## 2013-01-29 DIAGNOSIS — K0889 Other specified disorders of teeth and supporting structures: Secondary | ICD-10-CM

## 2013-01-29 HISTORY — DX: Other specified disorders of teeth and supporting structures: K08.89

## 2013-01-29 MED ORDER — IBUPROFEN 600 MG PO TABS: 600.0000 mg | ORAL_TABLET | Freq: Four times a day (QID) | ORAL | Status: DC | PRN

## 2013-01-29 MED ORDER — AMOXICILLIN 500 MG PO CAPS: 500.0000 mg | ORAL_CAPSULE | Freq: Three times a day (TID) | ORAL | Status: DC

## 2013-01-29 NOTE — Progress Notes (Signed)
Patient ID: Danielle Reilly, female   DOB: [birth year], 37 y.o.   MRN: [id number] Chief complaint Left lower jaw pain since one day  History of presenting illness 37 year old female here with pain over her left lower jaw since yesterday. Patient reports having severe pain which radiated into her left ears as well. Chills and reports having difficulty swallowing because of pain. She has a left lower molar removed 2 years ago and the pain is mainly  over the cavity. Denies any fever, chills, headache, blurred vision, dizziness, nausea, vomiting, cough, shortness of breath, chest pain, abdominal pain, bowel or urinary symptoms.  Objective:  Vital signs in last 24 hours:  Filed Vitals:   01/29/13 1503  BP: 111/69  Pulse: 90  Temp: 98.5 F (36.9 C)  TempSrc: Oral  Resp: 18  Height: 5\' 5"  (1.651 m)  Weight: 164 lb (74.39 kg)  SpO2: 100%      Physical Exam:  General: Middle-aged female in no acute distress. HEENT: no pallor, no icterus, moist oral mucosa, no JVD, no lymphadenopathy, mild swelling over her left lower jaw with tender to palpation, multiple caried teeth b/l, left lower molar cavity with mild gingival inflammation, no posterior vaginal wall congestion or tonsillar inflammation Heart: Normal  s1 &s2  Regular rate and rhythm, without murmurs, rubs, gallops. Lungs: Clear to auscultation bilaterally. Abdomen: Soft, nontender, nondistended, positive bowel sounds. Neuro: Alert, awake, oriented x3, nonfocal.   Lab Results:  Basic Metabolic Panel:    Component Value Date/Time   NA 133* 12/25/2010 0659   K 3.3* 12/25/2010 0659   CL 100 12/25/2010 0659   CO2 27 12/25/2010 0659   BUN 8 12/25/2010 0659   CREATININE 0.78 12/25/2010 0659   GLUCOSE 99 12/25/2010 0659   CALCIUM 9.1 12/25/2010 0659   CBC:    Component Value Date/Time   WBC 6.6 01/04/2013 2306   HGB 9.0* 01/04/2013 2306   HCT 28.4* 01/04/2013 2306   PLT 331 01/04/2013 2306   MCV 72.6* 01/04/2013 2306   NEUTROABS 3.2 10/16/2012 1640   LYMPHSABS 1.9 10/16/2012 1640   MONOABS 0.5 10/16/2012 1640   EOSABS 0.0 10/16/2012 1640   BASOSABS 0.1 10/16/2012 1640    No results found for this or any previous visit (from the past 240 hour(s)).  Studies/Results: No results found.  Medications: Scheduled Meds: Continuous Infusions: PRN Meds:.    Assessment/Plan: Acute gingivitis Will prescribe her a course of amoxicillin for 5 days. Prn ibuprofen for pain. Recommend rinsing the the gums with warm water with 2 tsp salt.  Refer to dental clinic     Annelisa Ryback 01/29/2013, 3:17 PM

## 2013-01-29 NOTE — Progress Notes (Signed)
Pt is here for a follow up visit. Pt reports having severe mouth pain since last night. She believes it could be a cavity. The left side of her face is swollen. Her pain is a 12 and the pain is causing her ear to hurt.

## 2013-02-10 ENCOUNTER — Ambulatory Visit: Payer: Self-pay | Admitting: Internal Medicine

## 2013-02-10 ENCOUNTER — Inpatient Hospital Stay (HOSPITAL_COMMUNITY)
Admission: AD | Admit: 2013-02-10 | Discharge: 2013-02-10 | Disposition: A | Discharge status: Home / Self Care | Payer: No Typology Code available for payment source | Source: Ambulatory Visit | Attending: Obstetrics & Gynecology | Admitting: Obstetrics & Gynecology

## 2013-02-10 ENCOUNTER — Telehealth: Payer: Self-pay | Admitting: General Practice

## 2013-02-10 DIAGNOSIS — N920 Excessive and frequent menstruation with regular cycle: Secondary | ICD-10-CM | POA: Insufficient documentation

## 2013-02-10 DIAGNOSIS — R51 Headache: Secondary | ICD-10-CM | POA: Insufficient documentation

## 2013-02-10 LAB — CBC
Hemoglobin: 7.4 g/dL — ABNORMAL LOW (ref 12.0–15.0)
MCH: 22.3 pg — ABNORMAL LOW (ref 26.0–34.0)
MCHC: 31.4 g/dL (ref 30.0–36.0)
Platelets: 421 10*3/uL — ABNORMAL HIGH (ref 150–400)

## 2013-02-10 LAB — COMPREHENSIVE METABOLIC PANEL
ALT: 15 U/L (ref 0–35)
Calcium: 9.2 mg/dL (ref 8.4–10.5)
GFR calc Af Amer: 65 mL/min — ABNORMAL LOW (ref >90)
Glucose, Bld: 132 mg/dL — ABNORMAL HIGH (ref 70–99)
Sodium: 136 mEq/L (ref 135–145)
Total Protein: 7 g/dL (ref 6.0–8.3)

## 2013-02-10 MED ORDER — IBUPROFEN 800 MG PO TABS
800.0000 mg | ORAL_TABLET | Freq: Once | ORAL | Status: AC
  Administered 2013-02-10: 800 mg via ORAL
  Filled 2013-02-10: qty 1

## 2013-02-10 MED ORDER — IBUPROFEN 800 MG PO TABS: 800.0000 mg | ORAL_TABLET | Freq: Three times a day (TID) | ORAL | Status: DC | PRN

## 2013-02-10 NOTE — MAU Provider Note (Signed)
History     CSN: [id number]  Arrival date and time: 02/10/13 2028   First Provider Initiated Contact with Patient 02/10/13 2111      Chief Complaint  Patient presents with  . Vaginal Bleeding   Vaginal Bleeding    Danielle Reilly is a 37 y.o. [id number] who presents today with vaginal bleeding. She states that since Sunday she has been bleeding "very heavily". She states that she is soaking through a pad every 30-45 mins and passing clots. She reports minimal cramping/pressure. She has a hysteroscopy and ablation scheduled for 12/19. She is currently on Megace and reports taking is as prescribed.   No past medical history on file.  Past Surgical History  Procedure Laterality Date  . Laparoscopy for ectopic pregnancy    . Laparoscopy  12/25/2010    Procedure: LAPAROSCOPY OPERATIVE;  Surgeon: Roseanna Rainbow, MD;  Location: [facility];  Service: Gynecology;  Laterality: N/A;  Operative laparoscopy /cauterization of right hemorrhagic cyst wall. Removal of belly rings.  . Uterine biopsy      Family History  Problem Relation Age of Onset  . Drug abuse Mother   . Arthritis Maternal Aunt   . Cancer Maternal Aunt   . Drug abuse Maternal Aunt   . Arthritis Maternal Uncle   . Alcohol abuse Maternal Uncle   . Cancer Maternal Uncle   . Drug abuse Maternal Uncle   . Cancer Maternal Grandmother   . Alcohol abuse Maternal Grandfather   . Cancer Maternal Grandfather     History  Substance Use Topics  . Smoking status: Never Smoker   . Smokeless tobacco: Not on file  . Alcohol Use: 1.8 oz/week    3 Glasses of wine per week    Allergies: No Known Allergies  Prescriptions prior to admission  Medication Sig Dispense Refill  . amoxicillin (AMOXIL) 500 MG capsule Take 1 capsule (500 mg total) by mouth 3 (three) times daily.  15 capsule  0  . ferrous sulfate 325 (65 FE) MG tablet Take 1 tablet (325 mg total) by mouth daily.  30 tablet  3  . ibuprofen (ADVIL,MOTRIN) 600 MG tablet Take 1  tablet (600 mg total) by mouth every 6 (six) hours as needed for pain.  30 tablet  0  . megestrol (MEGACE) 40 MG tablet Take 1 tablet (40 mg total) by mouth 2 (two) times daily.  60 tablet  5  . Multiple Vitamin (MULTIVITAMIN) tablet Take 1 tablet by mouth daily.        Review of Systems  Genitourinary: Positive for vaginal bleeding.   Physical Exam   Blood pressure 102/51, pulse 109, temperature 97.8 F (36.6 C), temperature source Oral, resp. rate 20, height 5\' 4"  (1.626 m), weight 74.118 kg (163 lb 6.4 oz), last menstrual period 08/15/2012, SpO2 100.00%.  Physical Exam  Nursing note and vitals reviewed. Constitutional: She is oriented to person, place, and time. She appears well-developed and well-nourished. No distress.  Cardiovascular: Normal rate.   Respiratory: Effort normal.  GI: Soft. There is no tenderness.  Genitourinary:   External: no lesion Vagina: moderate amount of blood seen Cervix: pink, smooth, no CMT, 3cm clot removed from the os.  Uterus: enlarged, about 10-12 weeks size.     Neurological: She is alert and oriented to person, place, and time.  Skin: Skin is warm and dry.  Psychiatric: She has a normal mood and affect.    MAU Course  Procedures  Results for orders placed during the hospital  encounter of 02/10/13 (from the past 24 hour(s))  CBC     Status: Abnormal   Collection Time    02/10/13  9:04 PM      Result Value Range   WBC 8.0  4.0 - 10.5 K/uL   RBC 3.32 (*) 3.87 - 5.11 MIL/uL   Hemoglobin 7.4 (*) 12.0 - 15.0 g/dL   HCT 16.1 (*) 09.6 - 04.5 %   MCV 71.1 (*) 78.0 - 100.0 fL   MCH 22.3 (*) 26.0 - 34.0 pg   MCHC 31.4  30.0 - 36.0 g/dL   RDW 40.9 (*) 81.1 - 91.4 %   Platelets 421 (*) 150 - 400 K/uL  COMPREHENSIVE METABOLIC PANEL     Status: Abnormal   Collection Time    02/10/13  9:04 PM      Result Value Range   Sodium 136  135 - 145 mEq/L   Potassium 3.6  3.5 - 5.1 mEq/L   Chloride 104  96 - 112 mEq/L   CO2 20  19 - 32 mEq/L    Glucose, Bld 132 (*) 70 - 99 mg/dL   BUN 14  6 - 23 mg/dL   Creatinine, Ser 7.82 (*) 0.50 - 1.10 mg/dL   Calcium 9.2  8.4 - 95.6 mg/dL   Total Protein 7.0  6.0 - 8.3 g/dL   Albumin 3.6  3.5 - 5.2 g/dL   AST 19  0 - 37 U/L   ALT 15  0 - 35 U/L   Alkaline Phosphatase 43  39 - 117 U/L   Total Bilirubin 0.2 (*) 0.3 - 1.2 mg/dL   GFR calc non Af Amer 56 (*) >90 mL/min   GFR calc Af Amer 65 (*) >90 mL/min  POCT PREGNANCY, URINE     Status: None   Collection Time    02/10/13 10:11 PM      Result Value Range   Preg Test, Ur NEGATIVE  NEGATIVE   2225: C/W Dr. Penne Lash. Will see about patient qualifying for free lupron through the clinic.  Assessment and Plan   1. Menorrhagia    Continue megace Increase iron to TID (currently q day).  Will send clinic a note for consideration for free lupron Return to MAU as needed   Tawnya Crook 02/10/2013, 9:26 PM

## 2013-02-10 NOTE — MAU Note (Signed)
Pt presents with heavy vaginal bleeding since Friday, decreasing on Saturday and increasing again on Sunday with Client using an overnight pad every 30-45 minutes today.  Pt with hx of polyp on Uterus and [facility]  Pt able to eat but c/o lightheadness and headache and tired today. Pt soaked through clothes this evening with bleeding.

## 2013-02-10 NOTE — Telephone Encounter (Signed)
Patient called and left message stating she has been bleeding heavier and passing clots very badly and she is not scheduled for surgery till 12/19 and doesn't know if she should go to the ER or not. Called patient and she stated she is having heavy heavy bleeding for the past couple days and she is having big clots and filling the toilet up with blood and is saturating a pad in less than an hour. Advised patient to go to MAU for evaluation since she is having heavy bleeding she needs to be seen right away. Patient verbalized understanding and had no further questions

## 2013-02-12 NOTE — MAU Provider Note (Signed)
Attestation of Attending Supervision of Advanced Practitioner (CNM/NP): Evaluation and management procedures were performed by the Advanced Practitioner under my supervision and collaboration. I have reviewed the Advanced Practitioner's note and chart, and I agree with the management and plan.  Lugene Beougher H. 9:43 AM   

## 2013-03-03 ENCOUNTER — Ambulatory Visit: Payer: Self-pay

## 2013-03-05 ENCOUNTER — Telehealth: Payer: Self-pay | Admitting: *Deleted

## 2013-03-05 DIAGNOSIS — N920 Excessive and frequent menstruation with regular cycle: Secondary | ICD-10-CM

## 2013-03-05 MED ORDER — IBUPROFEN 800 MG PO TABS: 800.0000 mg | ORAL_TABLET | Freq: Three times a day (TID) | ORAL | Status: DC | PRN

## 2013-03-05 NOTE — Telephone Encounter (Signed)
Danielle Reilly left a message stating she would like to get a refill on her motrin 800 mg . Per chart review seen by Dr. Erin Fulling for menorrhagia and is having hysteroscopy with ablation 04/04/13. Was given motrin 800 mg 01/23/13.  Discussed with Dr. Burnice LoganKatrinka Blazing- no history kidney disease ,may have refill. Called [name] and left a message we are returning your call and will send your refill to your pharmacy- call if you have questions.

## 2013-03-17 ENCOUNTER — Encounter: Payer: Self-pay | Admitting: *Deleted

## 2013-03-25 ENCOUNTER — Telehealth: Payer: Self-pay | Admitting: *Deleted

## 2013-03-25 DIAGNOSIS — N938 Other specified abnormal uterine and vaginal bleeding: Secondary | ICD-10-CM

## 2013-03-25 MED ORDER — TRAMADOL HCL 50 MG PO TABS: ORAL_TABLET | ORAL | Status: DC

## 2013-03-25 MED ORDER — MEGESTROL ACETATE 40 MG PO TABS: ORAL_TABLET | ORAL | Status: DC

## 2013-03-25 NOTE — Telephone Encounter (Addendum)
Pt left message this morning requesting refill of two medications: Megace and Motrin. She also states that her dose of Megace had been increased and this is why she has run out of the medication. I called pt and discussed her concern. I stated that I could not find any notes that stated her dosage of Megace had been changed. The amount prescribed by Dr. Macon Large on 01/16/13 was Megace 40 mg, 1 tab twice daily. She states that when she came to MAU in late October, she was told by the midwife to take Megace 40 mg, 3 tabs TID. She did not want to take that much because it makes her eat too much. So she has been taking Megace 40 mg, 2 tabs TID since that visit. I explained to pt that I will need to send a message to Dr. Erin Fulling and await her response. Pt stated that she has enough Megace to last her until the weekend. She has only 5 tabs of Motrin 800 mg remaining and takes it every day. Her surgery is scheduled on 04/04/13. 1505  After receiving a response from Dr. Mendota Callas, I called pt back and provided the following information. She should stop taking Motrin and ALL NSAIDS beginning today. She has been prescribed an alternate medication to be used if she has cramping. The dose of Megace which she has been taking is too high. The maximum she should take is Megace 40 mg, 2 tablets twice daily. A new Rx has been sent to her pharmacy for this dose. Dr. Erin Fulling needs to see pt prior to the surgery date. She was given an appt for 12/12 @ 0815.  Pt agreed and voiced understanding of all information.

## 2013-03-25 NOTE — Addendum Note (Signed)
Addended by: Jill Side on: 03/25/2013 03:18 PM   Modules accepted: Orders

## 2013-03-28 ENCOUNTER — Encounter: Payer: Self-pay | Admitting: Obstetrics & Gynecology

## 2013-03-28 ENCOUNTER — Ambulatory Visit (INDEPENDENT_AMBULATORY_CARE_PROVIDER_SITE_OTHER): Payer: No Typology Code available for payment source | Admitting: Obstetrics & Gynecology

## 2013-03-28 VITALS — BP 121/72 | HR 70 | Temp 97.0°F | Ht 65.0 in | Wt 179.6 lb

## 2013-03-28 DIAGNOSIS — N9489 Other specified conditions associated with female genital organs and menstrual cycle: Secondary | ICD-10-CM

## 2013-03-28 DIAGNOSIS — N920 Excessive and frequent menstruation with regular cycle: Secondary | ICD-10-CM

## 2013-03-28 NOTE — Patient Instructions (Signed)
Hysteroscopy Hysteroscopy is a procedure used for looking inside the womb (uterus). It may be done for many different reasons, including:  To evaluate abnormal bleeding, fibroid (benign, noncancerous) tumors, polyps, scar tissue (adhesions), and possibly cancer of the uterus.  To look for lumps (tumors) and other uterine growths.  To look for causes of why a woman cannot get pregnant (infertility), causes of recurrent loss of pregnancy (miscarriages), or a lost intrauterine device (IUD).  To perform a sterilization by blocking the fallopian tubes from inside the uterus. A hysteroscopy should be done right after a menstrual period to be sure you are not pregnant. LET YOUR CAREGIVER KNOW ABOUT:   Allergies.  Medicines taken, including herbs, eyedrops, over-the-counter medicines, and creams.  Use of steroids (by mouth or creams).  Previous problems with anesthetics or numbing medicines.  History of bleeding or blood problems.  History of blood clots.  Possibility of pregnancy, if this applies.  Previous surgery.  Other health problems. RISKS AND COMPLICATIONS   Putting a hole in the uterus.  Excessive bleeding.  Infection.  Damage to the cervix.  Injury to other organs.  Allergic reaction to medicines.  Too much fluid used in the uterus for the procedure. BEFORE THE PROCEDURE   Do not take aspirin or blood thinners for a week before the procedure, or as directed. It can cause bleeding.  Arrive at least 60 minutes before the procedure or as directed to read and sign the necessary forms.  Arrange for someone to take you home after the procedure.  If you smoke, do not smoke for 2 weeks before the procedure. PROCEDURE   Your caregiver may give you medicine to relax you. He or she may also give you a medicine that numbs the area around the cervix (local anesthetic) or a medicine that makes you sleep (general anesthesia).  Sometimes, a medicine is placed in the  cervix the day before the procedure. This medicine makes the cervix have a larger opening (dilate). This makes it easier for the instrument to be inserted into the uterus.  A small instrument (hysteroscope) is inserted through the vagina into the uterus. This instrument is similar to a pencil-sized telescope with a light.  During the procedure, air or a liquid is put into the uterus, which allows the surgeon to see better.  Sometimes, tissue is gently scraped from inside the uterus. These tissue samples are sent to a specialist who looks at tissue samples (pathologist). The pathologist will give a report to your caregiver. This will help your caregiver decide if further treatment is necessary. The report will also help your caregiver decide on the best treatment if the test comes back abnormal. AFTER THE PROCEDURE   If you had a general anesthetic, you may be groggy for a couple hours after the procedure.  If you had a local anesthetic, you will be advised to rest at the surgical center or caregiver's office until you are stable and feel ready to go home.  You may have some cramping for a couple days.  You may have bleeding, which varies from light spotting for a few days to menstrual-like bleeding for up to 3 to 7 days. This is normal.  Have someone take you home. FINDING OUT THE RESULTS OF YOUR TEST Not all test results are available during your visit. If your test results are not back during the visit, make an appointment with your caregiver to find out the results. Do not assume everything is normal if you   have not heard from your caregiver or the medical facility. It is important for you to follow up on all of your test results. HOME CARE INSTRUCTIONS   Do not drive for 24 hours or as instructed.  Only take over-the-counter or prescription medicines for pain, discomfort, or fever as directed by your caregiver.  Do not take aspirin. It can cause or aggravate bleeding.  Do not drive or  drink alcohol while taking pain medicine.  You may resume your usual diet.  Do not use tampons, douche, or have sexual intercourse for 2 weeks, or as advised by your caregiver.  Rest and sleep for the first 24 to 48 hours.  Take your temperature twice a day for 4 to 5 days. Write it down. Give these temperatures to your caregiver if they are abnormal (above 98.6 F or 37.0 C).  Take medicines your caregiver has ordered as directed.  Follow your caregiver's advice regarding diet, exercise, lifting, driving, and general activities.  Take showers instead of baths for 2 weeks, or as recommended by your caregiver.  If you develop constipation:  Take a mild laxative with the advice of your caregiver.  Eat bran foods.  Drink enough water and fluids to keep your urine clear or pale yellow.  Try to have someone with you or available to you for the first 24 to 48 hours, especially if you had a general anesthetic.  Make sure you and your family understand everything about your operation and recovery.  Follow your caregiver's advice regarding follow-up appointments and Pap smears. SEEK MEDICAL CARE IF:   You feel dizzy or lightheaded.  You feel sick to your stomach (nauseous).  You develop abnormal vaginal discharge.  You develop a rash.  You have an abnormal reaction or allergy to your medicine.  You need stronger pain medicine. SEEK IMMEDIATE MEDICAL CARE IF:   Bleeding is heavier than a normal menstrual period or you have blood clots.  You have an oral temperature above 102 F (38.9 C), not controlled by medicine.  You have increasing cramps or pains not relieved with medicine.  You develop belly (abdominal) pain that does not seem to be related to the same area of earlier cramping and pain.  You pass out.  You develop pain in the tops of your shoulders (shoulder strap areas).  You develop shortness of breath. MAKE SURE YOU:   Understand these instructions.  Will  watch your condition.  Will get help right away if you are not doing well or get worse. Document Released: 07/10/2000 Document Revised: 06/26/2011 Document Reviewed: 10/31/2012 [employer] Patient Information 2014 [employer].  

## 2013-03-28 NOTE — Progress Notes (Signed)
Subjective:     Patient ID: Danielle Reilly, female   DOB: July 28, 1975, 37 y.o.   MRN: [id number]  [id number] you AA female [id number]  H/o EC x 1.  Pt c/o heavy bleeding.  Bleeding occurs daily.  She was noted to have a endometrial mass on hysterosonogram.  Bleeding controlled with Megace. Pt here for preop for hysteroscopy with polypectomy. Pt c/o increased weight gain with Megace.  History reviewed. No pertinent past medical history.  Past Surgical History  Procedure Laterality Date  . Laparoscopy for ectopic pregnancy    . Laparoscopy  12/25/2010    Procedure: LAPAROSCOPY OPERATIVE;  Surgeon: Roseanna Rainbow, MD;  Location: [facility];  Service: Gynecology;  Laterality: N/A;  Operative laparoscopy /cauterization of right hemorrhagic cyst wall. Removal of belly rings.  . Uterine biopsy     Current Outpatient Prescriptions on File Prior to Visit  Medication Sig Dispense Refill  . ferrous sulfate 325 (65 FE) MG tablet Take 1 tablet (325 mg total) by mouth daily.  30 tablet  3  . megestrol (MEGACE) 40 MG tablet Take 2 tablets by mouth twice daily  40 tablet  0  . Multiple Vitamin (MULTIVITAMIN) tablet Take 1 tablet by mouth daily.      . traMADol (ULTRAM) 50 MG tablet Take 1-2 tablets by mouth every 6 hours as needed for pain  20 tablet  0  . ibuprofen (ADVIL,MOTRIN) 600 MG tablet Take 1 tablet (600 mg total) by mouth every 6 (six) hours as needed for pain.  30 tablet  0  . ibuprofen (ADVIL,MOTRIN) 800 MG tablet Take 1 tablet (800 mg total) by mouth every 8 (eight) hours as needed.  30 tablet  0   No current facility-administered medications on file prior to visit.   No Known Allergies Family History  Problem Relation Age of Onset  . Drug abuse Mother   . Arthritis Maternal Aunt   . Cancer Maternal Aunt   . Drug abuse Maternal Aunt   . Arthritis Maternal Uncle   . Alcohol abuse Maternal Uncle   . Cancer Maternal Uncle   . Drug abuse Maternal Uncle   . Cancer Maternal Grandmother   . Alcohol abuse  Maternal Grandfather   . Cancer Maternal Grandfather    History   Social History  . Marital Status: Legally Separated    Spouse Name: N/A    Number of Children: N/A  . Years of Education: N/A   Occupational History  . Not on file.   Social History Main Topics  . Smoking status: Never Smoker   . Smokeless tobacco: Never Used  . Alcohol Use: 1.8 oz/week    3 Glasses of wine per week  . Drug Use: No  . Sexual Activity: Not Currently    Birth Control/ Protection: Condom   Other Topics Concern  . Not on file   Social History Narrative  . No narrative on file            Review of Systems     Objective:   Physical Exam BP 121/72  Pulse 70  Temp(Src) 97 F (36.1 C) (Oral)  Ht 5\' 5"  (1.651 m)  Wt 179 lb 9.6 oz (81.466 kg)  BMI 29.89 kg/m2  Lungs: CTA CV: RRR Abd: soft, NT, ND  CBC    Component Value Date/Time   WBC 8.0 02/10/2013 2104   RBC 3.32* 02/10/2013 2104   HGB 7.4* 02/10/2013 2104   HCT 23.6* 02/10/2013 2104   PLT 421* 02/10/2013  2104   MCV 71.1* 02/10/2013 2104   MCH 22.3* 02/10/2013 2104   MCHC 31.4 02/10/2013 2104   RDW 16.6* 02/10/2013 2104   LYMPHSABS 1.9 10/16/2012 1640   MONOABS 0.5 10/16/2012 1640   EOSABS 0.0 10/16/2012 1640   BASOSABS 0.1 10/16/2012 1640      01/15/2013 Clinical Data: DUB with prior ultrasound suggesting abnormal  endometrial thickening.  [place] SONOHYSTEROGRAM  Technique: Following cleansing of the cervix and vagina with  Betadine, a hysterosalpingogram catheter was placed within the  endocervical canal. Sonohysterogram was then performed with  transvaginal sonography during infusion of sterile saline solution  into the endometrial cavity.  Comparison: Ultrasound pelvis 10/25/2012  Findings: The uterus measures 8.2 x 2.9 x 6.8 cm. After infusion  of saline there is a 4.8 x 2.5 x 4.2 cm solid appearing isoechoic  mass within the endometrium which appears to be arising from the  anterior wall as fluid is demonstrated along  the posterior and  fundal margins of this mass. No definite internal color blood flow  is able to be visualized. The myometrium is grossly unremarkable  in appearance. The ovaries are normal bilaterally.  IMPRESSION:  A large endometrial mass which is non-dependent and appears  attached to the anterior uterine wall. This is suggestive of a  large polyp or less likely a fibroid. Given the age, malignancy is  considered less likely however not entirely excluded. Further  evaluation with GYN consultation and possible surgical evaluation  is recommended.      Assessment:     Menorrhagia and endometrial mass     Plan:     Patient desires surgical management with hysteroscopy with resection of endometrial mass and endometrial ablation.  The risks of surgery were discussed in detail with the patient including but not limited to: bleeding which may require transfusion or reoperation; infection which may require prolonged hospitalization or re-hospitalization and antibiotic therapy; injury to bowel, bladder, ureters and major vessels or other surrounding organs; need for additional procedures including laparotomy; thromboembolic phenomenon, incisional problems and other postoperative or anesthesia complications.  Patient was told that the likelihood that her condition and symptoms will be treated effectively with this surgical management was very high; the postoperative expectations were also discussed in detail. The patient also understands the alternative treatment options which were discussed in full. All questions were answered.

## 2013-04-01 ENCOUNTER — Encounter (HOSPITAL_COMMUNITY): Payer: Self-pay

## 2013-04-01 ENCOUNTER — Encounter (HOSPITAL_COMMUNITY)
Admission: RE | Admit: 2013-04-01 | Discharge: 2013-04-01 | Disposition: A | Discharge status: Home / Self Care | Payer: No Typology Code available for payment source | Source: Ambulatory Visit | Attending: Obstetrics & Gynecology | Admitting: Obstetrics & Gynecology

## 2013-04-01 DIAGNOSIS — Z01812 Encounter for preprocedural laboratory examination: Secondary | ICD-10-CM | POA: Insufficient documentation

## 2013-04-01 HISTORY — DX: Anemia, unspecified: D64.9

## 2013-04-01 HISTORY — DX: Gastro-esophageal reflux disease without esophagitis: K21.9

## 2013-04-01 LAB — CBC
HCT: 35.3 % — ABNORMAL LOW (ref 36.0–46.0)
MCH: 23.5 pg — ABNORMAL LOW (ref 26.0–34.0)
MCV: 75.4 fL — ABNORMAL LOW (ref 78.0–100.0)
RBC: 4.68 MIL/uL (ref 3.87–5.11)
RDW: 20 % — ABNORMAL HIGH (ref 11.5–15.5)
WBC: 8 10*3/uL (ref 4.0–10.5)

## 2013-04-01 NOTE — Patient Instructions (Signed)
20 Danielle Reilly  04/01/2013   Your procedure is scheduled on:  04/04/13  Enter through the Main Entrance of [facility] at 6 AM.  Pick up the phone at the desk and dial [phone].   Call this number if you have problems the morning of surgery: [phone]   Remember:   Do not eat food:After Midnight.  Do not drink clear liquids: After Midnight.  Take these medicines the morning of surgery with A SIP OF WATER: NA   Do not wear jewelry, make-up or nail polish.  Do not wear lotions, powders, or perfumes. You may wear deodorant.  Do not shave 48 hours prior to surgery.  Do not bring valuables to the hospital.  [facility] is not   responsible for any belongings or valuables brought to the hospital.  Contacts, dentures or bridgework may not be worn into surgery.  Leave suitcase in the car. After surgery it may be brought to your room.  For patients admitted to the hospital, checkout time is 11:00 AM the day of              discharge.   Patients discharged the day of surgery will not be allowed to drive             home.  Name and phone number of your driver: niece  Charlean Sanfilippo  Special Instructions:   Shower using CHG 2 nights before surgery and the night before surgery.  If you shower the day of surgery use CHG.  Use special wash - you have one bottle of CHG for all showers.  You should use approximately 1/3 of the bottle for each shower.   Please read over the following fact sheets that you were given:   Surgical Site Infection Prevention

## 2013-04-02 ENCOUNTER — Telehealth: Payer: Self-pay | Admitting: General Practice

## 2013-04-02 NOTE — Telephone Encounter (Signed)
Patient called and left message stating she is supposed to have surgery on Friday 12/19 and they gave her a scrub to use before surgery and she wants to know if she can use this in addition to her regular soap. Called patient stating I was returning her phone call and instructed patient to use just the soap they gave her however she could use her regular soap for her face and perineal areas. Patient verbalized understanding and had no further questions

## 2013-04-02 NOTE — Telephone Encounter (Signed)
Patient called and left message stating sorry to call again but I wasn't sure if I could continue to use lotion until the day of surgery, could someone please call me back? Called patient back telling her she could continue using lotion just not the day of surgery. Patient verbalized understanding and had no further questions

## 2013-04-04 ENCOUNTER — Encounter (HOSPITAL_COMMUNITY): Payer: No Typology Code available for payment source | Admitting: Anesthesiology

## 2013-04-04 ENCOUNTER — Ambulatory Visit (HOSPITAL_COMMUNITY)
Admission: RE | Admit: 2013-04-04 | Discharge: 2013-04-04 | Disposition: A | Discharge status: Home / Self Care | Payer: No Typology Code available for payment source | Source: Ambulatory Visit | Attending: Obstetrics & Gynecology | Admitting: Obstetrics & Gynecology

## 2013-04-04 ENCOUNTER — Encounter (HOSPITAL_COMMUNITY)
Admission: RE | Disposition: A | Discharge status: Home / Self Care | Payer: Self-pay | Source: Ambulatory Visit | Attending: Obstetrics & Gynecology

## 2013-04-04 ENCOUNTER — Encounter (HOSPITAL_COMMUNITY): Payer: Self-pay | Admitting: *Deleted

## 2013-04-04 ENCOUNTER — Encounter (HOSPITAL_COMMUNITY): Payer: Self-pay | Admitting: Pharmacy Technician

## 2013-04-04 ENCOUNTER — Ambulatory Visit (HOSPITAL_COMMUNITY): Payer: No Typology Code available for payment source | Admitting: Anesthesiology

## 2013-04-04 DIAGNOSIS — N920 Excessive and frequent menstruation with regular cycle: Secondary | ICD-10-CM | POA: Insufficient documentation

## 2013-04-04 DIAGNOSIS — N926 Irregular menstruation, unspecified: Secondary | ICD-10-CM

## 2013-04-04 DIAGNOSIS — R9389 Abnormal findings on diagnostic imaging of other specified body structures: Secondary | ICD-10-CM | POA: Insufficient documentation

## 2013-04-04 DIAGNOSIS — N939 Abnormal uterine and vaginal bleeding, unspecified: Secondary | ICD-10-CM

## 2013-04-04 DIAGNOSIS — N949 Unspecified condition associated with female genital organs and menstrual cycle: Secondary | ICD-10-CM | POA: Insufficient documentation

## 2013-04-04 DIAGNOSIS — N938 Other specified abnormal uterine and vaginal bleeding: Secondary | ICD-10-CM | POA: Insufficient documentation

## 2013-04-04 HISTORY — PX: DILITATION & CURRETTAGE/HYSTROSCOPY WITH NOVASURE ABLATION: SHX5568

## 2013-04-04 LAB — PREGNANCY, URINE: Preg Test, Ur: NEGATIVE

## 2013-04-04 SURGERY — DILATATION & CURETTAGE/HYSTEROSCOPY WITH NOVASURE ABLATION
Anesthesia: General | Site: Vagina

## 2013-04-04 MED ORDER — FENTANYL CITRATE 0.05 MG/ML IJ SOLN
INTRAMUSCULAR | Status: AC
  Filled 2013-04-04: qty 2

## 2013-04-04 MED ORDER — FENTANYL CITRATE 0.05 MG/ML IJ SOLN
INTRAMUSCULAR | Status: AC
  Administered 2013-04-04: 50 ug via INTRAVENOUS
  Filled 2013-04-04: qty 2

## 2013-04-04 MED ORDER — DEXAMETHASONE SODIUM PHOSPHATE 10 MG/ML IJ SOLN
INTRAMUSCULAR | Status: AC
  Filled 2013-04-04: qty 1

## 2013-04-04 MED ORDER — ONDANSETRON HCL 4 MG/2ML IJ SOLN
INTRAMUSCULAR | Status: AC
  Filled 2013-04-04: qty 2

## 2013-04-04 MED ORDER — HYDROCODONE-ACETAMINOPHEN 5-325 MG PO TABS
1.0000 | ORAL_TABLET | ORAL | Status: DC | PRN
  Administered 2013-04-04: 1 via ORAL

## 2013-04-04 MED ORDER — HYDROCODONE-ACETAMINOPHEN 5-325 MG PO TABS
ORAL_TABLET | ORAL | Status: AC
  Filled 2013-04-04: qty 1

## 2013-04-04 MED ORDER — MIDAZOLAM HCL 2 MG/2ML IJ SOLN
INTRAMUSCULAR | Status: AC
  Filled 2013-04-04: qty 2

## 2013-04-04 MED ORDER — DEXAMETHASONE SODIUM PHOSPHATE 10 MG/ML IJ SOLN
INTRAMUSCULAR | Status: DC | PRN
  Administered 2013-04-04: 10 mg via INTRAVENOUS

## 2013-04-04 MED ORDER — KETOROLAC TROMETHAMINE 30 MG/ML IJ SOLN
INTRAMUSCULAR | Status: AC
  Filled 2013-04-04: qty 1

## 2013-04-04 MED ORDER — BUPIVACAINE HCL (PF) 0.5 % IJ SOLN
INTRAMUSCULAR | Status: AC
  Filled 2013-04-04: qty 30

## 2013-04-04 MED ORDER — FENTANYL CITRATE 0.05 MG/ML IJ SOLN
25.0000 ug | INTRAMUSCULAR | Status: DC | PRN
  Administered 2013-04-04 (×3): 50 ug via INTRAVENOUS

## 2013-04-04 MED ORDER — PROPOFOL 10 MG/ML IV BOLUS
INTRAVENOUS | Status: DC | PRN
  Administered 2013-04-04: 200 mg via INTRAVENOUS

## 2013-04-04 MED ORDER — IBUPROFEN 600 MG PO TABS: 600.0000 mg | ORAL_TABLET | Freq: Four times a day (QID) | ORAL | Status: DC | PRN

## 2013-04-04 MED ORDER — PANTOPRAZOLE SODIUM 40 MG PO TBEC
DELAYED_RELEASE_TABLET | ORAL | Status: DC
Start: 2013-04-04 — End: 2013-04-04
  Filled 2013-04-04: qty 1

## 2013-04-04 MED ORDER — ONDANSETRON HCL 4 MG/2ML IJ SOLN
INTRAMUSCULAR | Status: DC | PRN
  Administered 2013-04-04: 4 mg via INTRAVENOUS

## 2013-04-04 MED ORDER — PROPOFOL 10 MG/ML IV EMUL
INTRAVENOUS | Status: AC
  Filled 2013-04-04: qty 20

## 2013-04-04 MED ORDER — MIDAZOLAM HCL 2 MG/2ML IJ SOLN
INTRAMUSCULAR | Status: DC | PRN
  Administered 2013-04-04: 2 mg via INTRAVENOUS

## 2013-04-04 MED ORDER — LIDOCAINE HCL (CARDIAC) 20 MG/ML IV SOLN
INTRAVENOUS | Status: DC | PRN
  Administered 2013-04-04: 50 mg via INTRAVENOUS

## 2013-04-04 MED ORDER — LIDOCAINE HCL (CARDIAC) 20 MG/ML IV SOLN
INTRAVENOUS | Status: AC
  Filled 2013-04-04: qty 5

## 2013-04-04 MED ORDER — KETOROLAC TROMETHAMINE 30 MG/ML IJ SOLN: 15.0000 mg | Freq: Once | INTRAMUSCULAR | Status: DC | PRN

## 2013-04-04 MED ORDER — BUPIVACAINE HCL (PF) 0.5 % IJ SOLN
INTRAMUSCULAR | Status: DC | PRN
  Administered 2013-04-04: 17 mL

## 2013-04-04 MED ORDER — MEGESTROL ACETATE 40 MG PO TABS: 40.0000 mg | ORAL_TABLET | Freq: Two times a day (BID) | ORAL | Status: DC

## 2013-04-04 MED ORDER — PANTOPRAZOLE SODIUM 40 MG PO TBEC
40.0000 mg | DELAYED_RELEASE_TABLET | Freq: Every day | ORAL | Status: DC
  Administered 2013-04-04: 40 mg via ORAL

## 2013-04-04 MED ORDER — LACTATED RINGERS IV SOLN
INTRAVENOUS | Status: DC | PRN
  Administered 2013-04-04: 1000 mL via INTRAUTERINE

## 2013-04-04 MED ORDER — LACTATED RINGERS IV SOLN
INTRAVENOUS | Status: DC
  Administered 2013-04-04: 125 mL/h via INTRAVENOUS
  Administered 2013-04-04: 1000 mL via INTRAVENOUS

## 2013-04-04 MED ORDER — METOCLOPRAMIDE HCL 5 MG/ML IJ SOLN: 10.0000 mg | Freq: Once | INTRAMUSCULAR | Status: DC | PRN

## 2013-04-04 MED ORDER — MEPERIDINE HCL 25 MG/ML IJ SOLN: 6.2500 mg | INTRAMUSCULAR | Status: DC | PRN

## 2013-04-04 MED ORDER — FENTANYL CITRATE 0.05 MG/ML IJ SOLN
INTRAMUSCULAR | Status: DC | PRN
  Administered 2013-04-04 (×2): 50 ug via INTRAVENOUS
  Administered 2013-04-04: 100 ug via INTRAVENOUS

## 2013-04-04 MED ORDER — LACTATED RINGERS IV SOLN
INTRAVENOUS | Status: DC
  Administered 2013-04-04 (×2): via INTRAVENOUS

## 2013-04-04 SURGICAL SUPPLY — 13 items
ABLATOR ENDOMETRIAL BIPOLAR (ABLATOR) ×2 IMPLANT
CATH ROBINSON RED A/P 16FR (CATHETERS) ×2 IMPLANT
CLOTH BEACON ORANGE TIMEOUT ST (SAFETY) ×2 IMPLANT
CONTAINER PREFILL 10% NBF 60ML (FORM) ×2 IMPLANT
DRSG TELFA 3X8 NADH (GAUZE/BANDAGES/DRESSINGS) ×2 IMPLANT
GLOVE BIO SURGEON STRL SZ7 (GLOVE) ×2 IMPLANT
GLOVE BIOGEL PI IND STRL 7.0 (GLOVE) ×1 IMPLANT
GLOVE BIOGEL PI INDICATOR 7.0 (GLOVE) ×1
GOWN STRL REIN XL XLG (GOWN DISPOSABLE) ×4 IMPLANT
PACK HYSTEROSCOPY LF (CUSTOM PROCEDURE TRAY) ×2 IMPLANT
PAD OB MATERNITY 4.3X12.25 (PERSONAL CARE ITEMS) ×2 IMPLANT
TOWEL OR 17X24 6PK STRL BLUE (TOWEL DISPOSABLE) ×4 IMPLANT
WATER STERILE IRR 1000ML POUR (IV SOLUTION) ×2 IMPLANT

## 2013-04-04 NOTE — Anesthesia Postprocedure Evaluation (Signed)
  Anesthesia Post-op Note  Patient: Danielle Reilly  Procedure(s) Performed: Procedure(s): DILATATION & CURETTAGE/HYSTEROSCOPY WITH Attempted NOVASURE ABLATION  (N/A)  Patient Location: PACU  Anesthesia Type:General  Level of Consciousness: awake, alert  and oriented  Airway and Oxygen Therapy: Patient Spontanous Breathing  Post-op Pain: none  Post-op Assessment: Post-op Vital signs reviewed, Patient's Cardiovascular Status Stable, Respiratory Function Stable, Patent Airway, No signs of Nausea or vomiting and Pain level controlled  Post-op Vital Signs: Reviewed and stable  Complications: No apparent anesthesia complications

## 2013-04-04 NOTE — H&P (Signed)
Patient ID: Danielle Reilly, female DOB: [birth year], 37 y.o. MRN: [id number]  [id number] you AA female [id number] H/o EC x 1. Pt c/o heavy bleeding. Bleeding occurs daily. She was noted to have a endometrial mass on hysterosonogram. Bleeding controlled with Megace. Pt here for preop for hysteroscopy with polypectomy. Pt c/o increased weight gain with Megace.  History reviewed. No pertinent past medical history.  Past Surgical History   Procedure  Laterality  Date   .  Laparoscopy for ectopic pregnancy     .  Laparoscopy   12/25/2010     Procedure: LAPAROSCOPY OPERATIVE; Surgeon: Roseanna Rainbow, MD; Location: [facility]; Service: Gynecology; Laterality: N/A; Operative laparoscopy /cauterization of right hemorrhagic cyst wall. Removal of belly rings.   .  Uterine biopsy      Current Outpatient Prescriptions on File Prior to Visit   Medication  Sig  Dispense  Refill   .  ferrous sulfate 325 (65 FE) MG tablet  Take 1 tablet (325 mg total) by mouth daily.  30 tablet  3   .  megestrol (MEGACE) 40 MG tablet  Take 2 tablets by mouth twice daily  40 tablet  0   .  Multiple Vitamin (MULTIVITAMIN) tablet  Take 1 tablet by mouth daily.     .  traMADol (ULTRAM) 50 MG tablet  Take 1-2 tablets by mouth every 6 hours as needed for pain  20 tablet  0   .  ibuprofen (ADVIL,MOTRIN) 600 MG tablet  Take 1 tablet (600 mg total) by mouth every 6 (six) hours as needed for pain.  30 tablet  0   .  ibuprofen (ADVIL,MOTRIN) 800 MG tablet  Take 1 tablet (800 mg total) by mouth every 8 (eight) hours as needed.  30 tablet  0    No current facility-administered medications on file prior to visit.   No Known Allergies  Family History   Problem  Relation  Age of Onset   .  Drug abuse  Mother    .  Arthritis  Maternal Aunt    .  Cancer  Maternal Aunt    .  Drug abuse  Maternal Aunt    .  Arthritis  Maternal Uncle    .  Alcohol abuse  Maternal Uncle    .  Cancer  Maternal Uncle    .  Drug abuse  Maternal Uncle    .  Cancer  Maternal  Grandmother    .  Alcohol abuse  Maternal Grandfather    .  Cancer  Maternal Grandfather     History    Social History   .  Marital Status:  Legally Separated     Spouse Name:  N/A     Number of Children:  N/A   .  Years of Education:  N/A    Occupational History   .  Not on file.    Social History Main Topics   .  Smoking status:  Never Smoker   .  Smokeless tobacco:  Never Used   .  Alcohol Use:  1.8 oz/week     3 Glasses of wine per week   .  Drug Use:  No   .  Sexual Activity:  Not Currently     Birth Control/ Protection:  Condom    Other Topics  Concern   .  Not on file    Social History Narrative   .  No narrative on file   Review of Systems  Objective:  Physical Exam BP 121/72  Pulse 70  Temp(Src) 97 F (36.1 C) (Oral)  Ht 5\' 5"  (1.651 m)  Wt 179 lb 9.6 oz (81.466 kg)  BMI 29.89 kg/m2  Lungs: CTA  CV: RRR  Abd: soft, NT, ND  CBC    Component  Value  Date/Time    WBC  8.0  02/10/2013 2104    RBC  3.32*  02/10/2013 2104    HGB  7.4*  02/10/2013 2104    HCT  23.6*  02/10/2013 2104    PLT  421*  02/10/2013 2104    MCV  71.1*  02/10/2013 2104    MCH  22.3*  02/10/2013 2104    MCHC  31.4  02/10/2013 2104    RDW  16.6*  02/10/2013 2104    LYMPHSABS  1.9  10/16/2012 1640    MONOABS  0.5  10/16/2012 1640    EOSABS  0.0  10/16/2012 1640    BASOSABS  0.1  10/16/2012 1640    CBC    Component Value Date/Time   WBC 8.0 04/01/2013 0950   RBC 4.68 04/01/2013 0950   HGB 11.0* 04/01/2013 0950   HCT 35.3* 04/01/2013 0950   PLT 315 04/01/2013 0950   MCV 75.4* 04/01/2013 0950   MCH 23.5* 04/01/2013 0950   MCHC 31.2 04/01/2013 0950   RDW 20.0* 04/01/2013 0950   LYMPHSABS 1.9 10/16/2012 1640   MONOABS 0.5 10/16/2012 1640   EOSABS 0.0 10/16/2012 1640   BASOSABS 0.1 10/16/2012 1640      01/15/2013  Clinical Data: DUB with prior ultrasound suggesting abnormal  endometrial thickening.  [place] SONOHYSTEROGRAM  Technique: Following cleansing of the cervix and vagina with   Betadine, a hysterosalpingogram catheter was placed within the  endocervical canal. Sonohysterogram was then performed with  transvaginal sonography during infusion of sterile saline solution  into the endometrial cavity.  Comparison: Ultrasound pelvis 10/25/2012  Findings: The uterus measures 8.2 x 2.9 x 6.8 cm. After infusion  of saline there is a 4.8 x 2.5 x 4.2 cm solid appearing isoechoic  mass within the endometrium which appears to be arising from the  anterior wall as fluid is demonstrated along the posterior and  fundal margins of this mass. No definite internal color blood flow  is able to be visualized. The myometrium is grossly unremarkable  in appearance. The ovaries are normal bilaterally.  IMPRESSION:  A large endometrial mass which is non-dependent and appears  attached to the anterior uterine wall. This is suggestive of a  large polyp or less likely a fibroid. Given the age, malignancy is  considered less likely however not entirely excluded. Further  evaluation with GYN consultation and possible surgical evaluation  is recommended.  Assessment:   Menorrhagia and endometrial mass  Plan:   Patient desires surgical management with hysteroscopy with resection of endometrial mass and endometrial ablation. The risks of surgery were discussed in detail with the patient including but not limited to: bleeding which may require transfusion or reoperation; infection which may require prolonged hospitalization or re-hospitalization and antibiotic therapy; injury to bowel, bladder, ureters and major vessels or other surrounding organs; need for additional procedures including laparotomy; thromboembolic phenomenon, incisional problems and other postoperative or anesthesia complications. Patient was told that the likelihood that her condition and symptoms will be treated effectively with this surgical management was very high; the postoperative expectations were also discussed in detail.  The patient also understands the alternative treatment options which were discussed in full. All questions  were answered.

## 2013-04-04 NOTE — Op Note (Addendum)
04/04/2013  8:48 AM  PATIENT:  Danielle Reilly  37 y.o. female  PRE-OPERATIVE DIAGNOSIS:   Menorrhagia  POST-OPERATIVE DIAGNOSIS:  menorrhagia  PROCEDURE:  Procedure(s): DILATATION & CURETTAGE/HYSTEROSCOPY WITH Attempted NOVASURE ABLATION  (N/A); Paracervical block  SURGEON:  Surgeon(s) and Role:    * Willodean Rosenthal, MD - Primary  ANESTHESIA:   general  EBL:  Total I/O In: 1300 [I.V.:1300] Out: 100 [Urine:75; Blood:25]  BLOOD ADMINISTERED:none  DRAINS: none   LOCAL MEDICATIONS USED:  MARCAINE     SPECIMEN:  Source of Specimen:  endometrial currettings  DISPOSITION OF SPECIMEN:  PATHOLOGY  COUNTS:  YES  TOURNIQUET:  * No tourniquets in log *  DICTATION: .Note written in EPIC  PLAN OF CARE: Discharge to home after PACU  PATIENT DISPOSITION:  PACU - hemodynamically stable.   Delay start of Pharmacological VTE agent (>24hrs) due to surgical blood loss or risk of bleeding: not applicable  COMPLICATIONS:  None immediate.  INDICATIONS: 37 y.o. [id number]  here for scheduled surgery for hysteroscopy with removal of endometrial mass, dilation and curretage  and endometrial ablation.   Risks of surgery were discussed with the patient including but not limited to: bleeding which may require transfusion; infection which may require antibiotics; injury to uterus or surrounding organs; intrauterine scarring which may impair future fertility; need for additional procedures including laparotomy or laparoscopy; and other postoperative/anesthesia complications. I explained to patient preoperatively that if she had too much tissue lining the endometrium that the endometrial ablation would not be done.  Written informed consent was obtained.    FINDINGS:  A 10 week size uterus.  Diffuse proliferative endometrium.  Ostia not visualized bilaterally.  PROCEDURE DETAILS:  The patient was taken to the operating room where general anesthesia was administered with LMA and was found to be  adequate.  After an adequate timeout was performed, she was placed in the dorsal lithotomy position and examined; then prepped and draped in the sterile manner.   Her bladder was catheterized for an unmeasured amount of clear, yellow urine. A speculum was then placed in the patient's vagina and a single tooth tenaculum was applied to the anterior lip of the cervix.   A paracervical block of 0.5% Marcaine was placed a total of 15cc The cervix was sounded to 8.5 cm.  Her cervix was patent and it was difficult to maintain occlusion.  5 mm diagnostic hysteroscope was inserted under direct visualization using 1.5% glycine as a suspension medium.  The uterine cavity was carefully examined.  There were lots of clots and active bleeding. There also was a larger amount of tissue but, no noted masses were appreciated. A diffusely proliferative endometrium was noted.   After further careful visualization of the uterine cavity, the hysteroscope was removed under direct visualization.  A sharp curettage was then performed to obtain a moderate amount of endometrial curettings.  The hysteroscope was reinserted and the cavity was found to be intact with no lesions or masses appreciated. An attempt was made to insert the Novasure device but, a seal could not be obtained so that portion of the procedure was aborted after 3 attempts.  The scope was reinserted to check the integrity of the cavity.  It was intact.  All instruments were removed from the vagina.  After good hemostasis was noted.  The patient tolerated the procedure well and was taken to the recovery area awake, extubated and in stable condition.  The patient will be discharged to home as per PACU criteria.  Routine postoperative instructions given.  She was prescribed Percocet, Ibuprofen and Colace.  She will follow up in the clinic in 6 weeks for postoperative evaluation.

## 2013-04-04 NOTE — Transfer of Care (Signed)
Immediate Anesthesia Transfer of Care Note  Patient: Danielle Reilly  Procedure(s) Performed: Procedure(s): DILATATION & CURETTAGE/HYSTEROSCOPY WITH Attempted NOVASURE ABLATION  (N/A)  Patient Location: PACU  Anesthesia Type:General  Level of Consciousness: awake  Airway & Oxygen Therapy: Patient Spontanous Breathing  Post-op Assessment: Report given to PACU RN  Post vital signs: stable  Filed Vitals:   04/04/13 0602  BP: 110/91  Pulse: 64  Temp: 36.7 C  Resp: 16    Complications: No apparent anesthesia complications

## 2013-04-04 NOTE — Anesthesia Preprocedure Evaluation (Signed)
Anesthesia Evaluation  Patient identified by MRN, date of birth, ID band Patient awake    Reviewed: Allergy & Precautions, H&P , NPO status , Patient's Chart, lab work & pertinent test results, reviewed documented beta blocker date and time   History of Anesthesia Complications Negative for: history of anesthetic complications  Airway Mallampati: III TM Distance: >3 FB Neck ROM: full    Dental  (+) Teeth Intact   Pulmonary neg pulmonary ROS,  breath sounds clear to auscultation  Pulmonary exam normal       Cardiovascular negative cardio ROS  Rhythm:regular Rate:Normal     Neuro/Psych negative neurological ROS  negative psych ROS   GI/Hepatic Neg liver ROS, GERD- (prn tums)  Medicated,  Endo/Other  negative endocrine ROS  Renal/GU negative Renal ROS  Female GU complaint     Musculoskeletal   Abdominal   Peds  Hematology  (+) anemia ,   Anesthesia Other Findings   Reproductive/Obstetrics negative OB ROS                           Anesthesia Physical Anesthesia Plan  ASA: II  Anesthesia Plan: General LMA   Post-op Pain Management:    Induction:   Airway Management Planned:   Additional Equipment:   Intra-op Plan:   Post-operative Plan:   Informed Consent: I have reviewed the patients History and Physical, chart, labs and discussed the procedure including the risks, benefits and alternatives for the proposed anesthesia with the patient or authorized representative who has indicated his/her understanding and acceptance.   Dental Advisory Given  Plan Discussed with: CRNA and Surgeon  Anesthesia Plan Comments:         Anesthesia Quick Evaluation

## 2013-04-04 NOTE — Brief Op Note (Signed)
04/04/2013  8:48 AM  PATIENT:  Danielle Reilly  37 y.o. female  PRE-OPERATIVE DIAGNOSIS:   Menorrhagia  POST-OPERATIVE DIAGNOSIS:  menorrhagia  PROCEDURE:  Procedure(s): DILATATION & CURETTAGE/HYSTEROSCOPY WITH Attempted NOVASURE ABLATION  (N/A)  SURGEON:  Surgeon(s) and Role:    * Willodean Rosenthal, MD - Primary  ANESTHESIA:   general  EBL:  Total I/O In: 1300 [I.V.:1300] Out: 100 [Urine:75; Blood:25]  BLOOD ADMINISTERED:none  DRAINS: none   LOCAL MEDICATIONS USED:  MARCAINE     SPECIMEN:  Source of Specimen:  endometrial currettings  DISPOSITION OF SPECIMEN:  PATHOLOGY  COUNTS:  YES  TOURNIQUET:  * No tourniquets in log *  DICTATION: .Note written in EPIC  PLAN OF CARE: Discharge to home after PACU  PATIENT DISPOSITION:  PACU - hemodynamically stable.   Delay start of Pharmacological VTE agent (>24hrs) due to surgical blood loss or risk of bleeding: not applicable

## 2013-04-07 ENCOUNTER — Encounter (HOSPITAL_COMMUNITY): Payer: Self-pay | Admitting: Obstetrics & Gynecology

## 2013-04-07 ENCOUNTER — Telehealth: Payer: Self-pay | Admitting: Obstetrics & Gynecology

## 2013-04-07 NOTE — Telephone Encounter (Signed)
Pt s/p hysteroscopy with D&C 3 days prev.  Pathology called to say that there was adipose tissue in the specimen. Pt called to notify and check on her clinical condition. She denies N/V.  She is tolerating regular diet and reports flatus.  She reports some bleeding which has improved.  She wishes to proceed with a reattempt at the ablation in 4-6 weeks when the perforation is healed.  She will f/u Jan 22nd or 26th as scheduled.  All of patients questions were answered. Pt is to call sooner prn problems.  l

## 2013-04-14 ENCOUNTER — Telehealth: Payer: Self-pay | Admitting: *Deleted

## 2013-04-14 NOTE — Telephone Encounter (Signed)
Danielle Reilly called and left a message she had surgery 04/04/13 and her bleeding has slowed down a lot, but today noticed she passed a clot 1.5 inches thick. Wants to know if that is normal. Per chart had D&c/ attemped novasure ablation.  Called Danielle Reilly and she states she starting excercising today as Dr. York Spaniel she could resume normal activity.  She states her bleeding is still light but was concerned about the clot.  I advised her that especially since she is increasing her activity is normal to pass a small clot is normal, we discussed to watch her bleeding and if she has heavy bleeding enough to saturate a pad in one hour or less or passed large clots size of fist to come to MAU.  Also advised to keep already scheduled post op appt.  Danielle Reilly voices understanding.

## 2013-05-08 ENCOUNTER — Encounter: Payer: Self-pay | Admitting: Obstetrics & Gynecology

## 2013-05-08 ENCOUNTER — Ambulatory Visit (INDEPENDENT_AMBULATORY_CARE_PROVIDER_SITE_OTHER): Payer: No Typology Code available for payment source | Admitting: Obstetrics & Gynecology

## 2013-05-08 VITALS — BP 130/75 | HR 115 | Temp 98.0°F | Ht 65.0 in | Wt 180.8 lb

## 2013-05-08 DIAGNOSIS — N938 Other specified abnormal uterine and vaginal bleeding: Secondary | ICD-10-CM

## 2013-05-08 DIAGNOSIS — N949 Unspecified condition associated with female genital organs and menstrual cycle: Secondary | ICD-10-CM

## 2013-05-08 DIAGNOSIS — Z09 Encounter for follow-up examination after completed treatment for conditions other than malignant neoplasm: Secondary | ICD-10-CM

## 2013-05-08 DIAGNOSIS — N925 Other specified irregular menstruation: Secondary | ICD-10-CM

## 2013-05-08 NOTE — Patient Instructions (Signed)

## 2013-05-08 NOTE — Progress Notes (Signed)
Subjective:     Patient ID: Danielle Reilly, female   DOB: 05/15/1975, 38 y.o.   MRN: [id number]  HPI Pt s/p hysteroscopy with dilation and currettage and failed ablation possibly due to occult uterine perforation.  Pt wants to attempt ablation again.  She denies having abdominal pain or cramping but reports continued bleeding.  She wants the bleeding to stop. She is not interested in trying the IUD or other treatemts at this time.  She is aware that if this fails she cannot have an IUD and the next step is a hyst.  Review of Systems     Objective:   Physical Exam BP 130/75  Pulse 115  Temp(Src) 98 F (36.7 C) (Oral)  Ht 5\' 5"  (1.651 m)  Wt 180 lb 12.8 oz (82.01 kg)  BMI 30.09 kg/m2 Exam deferred  04/14/2013 Diagnosis Endometrium, curettage - INACTIVE ENDOMETRIUM WITH PROGESTATIONAL EFFECT, BENIGN SQUAMOUS FRAGMENTS AND ABUNDANT BLOOD. - BENIGN ADIPOSE TISSUE. - SEE MICROSCOPIC DESCRIPTION.     Assessment:     DUB- pt desires ablation.  No need to repeat D&C.  Reviewed treatment options.  Pt declines other treatment    Plan:     Schedule Novasure hysteroscopy (needs to be greater than 4 week after prev surgery)

## 2013-05-16 ENCOUNTER — Encounter (HOSPITAL_COMMUNITY): Payer: Self-pay | Admitting: *Deleted

## 2013-05-26 ENCOUNTER — Ambulatory Visit (HOSPITAL_COMMUNITY)
Admission: RE | Admit: 2013-05-26 | Discharge: 2013-05-26 | Disposition: A | Discharge status: Home / Self Care | Payer: No Typology Code available for payment source | Source: Ambulatory Visit | Attending: Obstetrics & Gynecology | Admitting: Obstetrics & Gynecology

## 2013-05-26 ENCOUNTER — Encounter (HOSPITAL_COMMUNITY): Payer: MEDICAID | Admitting: Anesthesiology

## 2013-05-26 ENCOUNTER — Ambulatory Visit (HOSPITAL_COMMUNITY): Payer: Self-pay | Admitting: Anesthesiology

## 2013-05-26 ENCOUNTER — Encounter (HOSPITAL_COMMUNITY)
Admission: RE | Disposition: A | Discharge status: Home / Self Care | Payer: Self-pay | Source: Ambulatory Visit | Attending: Obstetrics & Gynecology

## 2013-05-26 ENCOUNTER — Encounter (HOSPITAL_COMMUNITY): Payer: Self-pay | Admitting: Anesthesiology

## 2013-05-26 DIAGNOSIS — N926 Irregular menstruation, unspecified: Secondary | ICD-10-CM

## 2013-05-26 DIAGNOSIS — Z5309 Procedure and treatment not carried out because of other contraindication: Secondary | ICD-10-CM | POA: Insufficient documentation

## 2013-05-26 DIAGNOSIS — D649 Anemia, unspecified: Secondary | ICD-10-CM | POA: Insufficient documentation

## 2013-05-26 DIAGNOSIS — K219 Gastro-esophageal reflux disease without esophagitis: Secondary | ICD-10-CM | POA: Insufficient documentation

## 2013-05-26 DIAGNOSIS — N949 Unspecified condition associated with female genital organs and menstrual cycle: Secondary | ICD-10-CM | POA: Insufficient documentation

## 2013-05-26 DIAGNOSIS — N939 Abnormal uterine and vaginal bleeding, unspecified: Secondary | ICD-10-CM

## 2013-05-26 DIAGNOSIS — N938 Other specified abnormal uterine and vaginal bleeding: Secondary | ICD-10-CM | POA: Insufficient documentation

## 2013-05-26 HISTORY — PX: HYSTEROSCOPY: SHX211

## 2013-05-26 HISTORY — PX: NOVASURE ABLATION: SHX5394

## 2013-05-26 LAB — PREGNANCY, URINE: PREG TEST UR: NEGATIVE

## 2013-05-26 LAB — CBC
HEMATOCRIT: 34.3 % — AB (ref 36.0–46.0)
HEMOGLOBIN: 11 g/dL — AB (ref 12.0–15.0)
MCH: 23.8 pg — ABNORMAL LOW (ref 26.0–34.0)
MCHC: 32.1 g/dL (ref 30.0–36.0)
MCV: 74.2 fL — ABNORMAL LOW (ref 78.0–100.0)
Platelets: 320 10*3/uL (ref 150–400)
RBC: 4.62 MIL/uL (ref 3.87–5.11)
RDW: 17.8 % — ABNORMAL HIGH (ref 11.5–15.5)
WBC: 4.7 10*3/uL (ref 4.0–10.5)

## 2013-05-26 SURGERY — NOVASURE ABLATION
Anesthesia: General | Site: Vagina

## 2013-05-26 MED ORDER — LACTATED RINGERS IV SOLN
INTRAVENOUS | Status: DC
  Administered 2013-05-26 (×2): via INTRAVENOUS

## 2013-05-26 MED ORDER — MIDAZOLAM HCL 2 MG/2ML IJ SOLN
INTRAMUSCULAR | Status: DC | PRN
  Administered 2013-05-26: 2 mg via INTRAVENOUS

## 2013-05-26 MED ORDER — FENTANYL CITRATE 0.05 MG/ML IJ SOLN
INTRAMUSCULAR | Status: AC
  Administered 2013-05-26: 50 ug via INTRAVENOUS
  Filled 2013-05-26: qty 2

## 2013-05-26 MED ORDER — LIDOCAINE HCL (CARDIAC) 20 MG/ML IV SOLN
INTRAVENOUS | Status: AC
  Filled 2013-05-26: qty 5

## 2013-05-26 MED ORDER — FENTANYL CITRATE 0.05 MG/ML IJ SOLN
INTRAMUSCULAR | Status: AC
  Filled 2013-05-26: qty 2

## 2013-05-26 MED ORDER — FENTANYL CITRATE 0.05 MG/ML IJ SOLN
INTRAMUSCULAR | Status: DC | PRN
  Administered 2013-05-26: 50 ug via INTRAVENOUS
  Administered 2013-05-26: 100 ug via INTRAVENOUS
  Administered 2013-05-26: 50 ug via INTRAVENOUS

## 2013-05-26 MED ORDER — BUPIVACAINE HCL (PF) 0.5 % IJ SOLN
INTRAMUSCULAR | Status: DC | PRN
  Administered 2013-05-26: 20 mL

## 2013-05-26 MED ORDER — ONDANSETRON HCL 4 MG/2ML IJ SOLN
INTRAMUSCULAR | Status: DC | PRN
  Administered 2013-05-26: 4 mg via INTRAVENOUS

## 2013-05-26 MED ORDER — DEXAMETHASONE SODIUM PHOSPHATE 10 MG/ML IJ SOLN
INTRAMUSCULAR | Status: AC
  Filled 2013-05-26: qty 1

## 2013-05-26 MED ORDER — DEXAMETHASONE SODIUM PHOSPHATE 10 MG/ML IJ SOLN
INTRAMUSCULAR | Status: DC | PRN
  Administered 2013-05-26: 10 mg via INTRAVENOUS

## 2013-05-26 MED ORDER — ONDANSETRON HCL 4 MG/2ML IJ SOLN: 4.0000 mg | Freq: Once | INTRAMUSCULAR | Status: DC | PRN

## 2013-05-26 MED ORDER — BUPIVACAINE HCL (PF) 0.5 % IJ SOLN
INTRAMUSCULAR | Status: AC
  Filled 2013-05-26: qty 30

## 2013-05-26 MED ORDER — MIDAZOLAM HCL 2 MG/2ML IJ SOLN
INTRAMUSCULAR | Status: AC
  Filled 2013-05-26: qty 2

## 2013-05-26 MED ORDER — ONDANSETRON HCL 4 MG/2ML IJ SOLN
INTRAMUSCULAR | Status: AC
  Filled 2013-05-26: qty 2

## 2013-05-26 MED ORDER — LIDOCAINE HCL (CARDIAC) 20 MG/ML IV SOLN
INTRAVENOUS | Status: DC | PRN
  Administered 2013-05-26: 50 mg via INTRAVENOUS

## 2013-05-26 MED ORDER — KETOROLAC TROMETHAMINE 30 MG/ML IJ SOLN
INTRAMUSCULAR | Status: AC
  Filled 2013-05-26: qty 1

## 2013-05-26 MED ORDER — PROPOFOL 10 MG/ML IV BOLUS
INTRAVENOUS | Status: DC | PRN
  Administered 2013-05-26: 180 mg via INTRAVENOUS

## 2013-05-26 MED ORDER — LACTATED RINGERS IV SOLN
INTRAVENOUS | Status: DC
  Administered 2013-05-26: 10:00:00 via INTRAVENOUS

## 2013-05-26 MED ORDER — MEPERIDINE HCL 25 MG/ML IJ SOLN: 6.2500 mg | INTRAMUSCULAR | Status: DC | PRN

## 2013-05-26 MED ORDER — KETOROLAC TROMETHAMINE 30 MG/ML IJ SOLN: 15.0000 mg | Freq: Once | INTRAMUSCULAR | Status: DC | PRN

## 2013-05-26 MED ORDER — IBUPROFEN 600 MG PO TABS: 600.0000 mg | ORAL_TABLET | Freq: Four times a day (QID) | ORAL | Status: DC | PRN

## 2013-05-26 MED ORDER — KETOROLAC TROMETHAMINE 30 MG/ML IJ SOLN
INTRAMUSCULAR | Status: DC | PRN
  Administered 2013-05-26: 30 mg via INTRAVENOUS

## 2013-05-26 MED ORDER — PROPOFOL 10 MG/ML IV EMUL
INTRAVENOUS | Status: AC
  Filled 2013-05-26: qty 20

## 2013-05-26 MED ORDER — FENTANYL CITRATE 0.05 MG/ML IJ SOLN
25.0000 ug | INTRAMUSCULAR | Status: DC | PRN
  Administered 2013-05-26: 50 ug via INTRAVENOUS

## 2013-05-26 SURGICAL SUPPLY — 21 items
ABLATOR ENDOMETRIAL BIPOLAR (ABLATOR) ×3 IMPLANT
CATH ROBINSON RED A/P 16FR (CATHETERS) ×3 IMPLANT
CLOTH BEACON ORANGE TIMEOUT ST (SAFETY) ×3 IMPLANT
CONTAINER PREFILL 10% NBF 60ML (FORM) IMPLANT
DRAPE HYSTEROSCOPY (DRAPE) ×3 IMPLANT
DRSG TELFA 3X8 NADH (GAUZE/BANDAGES/DRESSINGS) ×3 IMPLANT
GAUZE VASELINE 3X9 (GAUZE/BANDAGES/DRESSINGS) ×3 IMPLANT
GLOVE BIO SURGEON STRL SZ7 (GLOVE) ×3 IMPLANT
GLOVE BIOGEL PI IND STRL 6.5 (GLOVE) ×1 IMPLANT
GLOVE BIOGEL PI IND STRL 7.0 (GLOVE) ×1 IMPLANT
GLOVE BIOGEL PI INDICATOR 6.5 (GLOVE) ×2
GLOVE BIOGEL PI INDICATOR 7.0 (GLOVE) ×2
GLOVE ECLIPSE 6.5 STRL STRAW (GLOVE) ×3 IMPLANT
GOWN STRL REUS W/ TWL XL LVL3 (GOWN DISPOSABLE) ×1 IMPLANT
GOWN STRL REUS W/TWL LRG LVL3 (GOWN DISPOSABLE) ×3 IMPLANT
GOWN STRL REUS W/TWL XL LVL3 (GOWN DISPOSABLE) ×2
PACK VAGINAL MINOR WOMEN LF (CUSTOM PROCEDURE TRAY) ×3 IMPLANT
PAD OB MATERNITY 4.3X12.25 (PERSONAL CARE ITEMS) ×3 IMPLANT
SET GENESYS HTA PROCERVA (MISCELLANEOUS) ×3 IMPLANT
TOWEL OR 17X24 6PK STRL BLUE (TOWEL DISPOSABLE) ×6 IMPLANT
WATER STERILE IRR 1000ML POUR (IV SOLUTION) ×3 IMPLANT

## 2013-05-26 NOTE — Brief Op Note (Signed)
05/26/2013  12:09 PM  PATIENT:  Danielle Reilly  38 y.o. female  PRE-OPERATIVE DIAGNOSIS:   for dysfunctional uterine leeding  POST-OPERATIVE DIAGNOSIS:   for dysfunctional uterine leeding  PROCEDURE:  Procedure(s): UNSUCCESSFUL NOVASURE ABLATION (N/A) UNSUCCESSFUL HYSTEROSCOPY WITH HYDROTHERMAL ABLATION (N/A)  SURGEON:  Surgeon(s) and Role:    * Willodean Rosenthalarolyn Harraway-Smith, MD - Primary    * Tereso NewcomerUgonna A Anyanwu, MD - Assisting  ANESTHESIA:   local  EBL:  Total I/O In: 1500 [I.V.:1500] Out: 95 [Urine:50; Blood:45]  BLOOD ADMINISTERED:none  DRAINS: none   LOCAL MEDICATIONS USED:  MARCAINE     SPECIMEN:  No Specimen  DISPOSITION OF SPECIMEN:  N/A  COUNTS:  YES  TOURNIQUET:  * No tourniquets in log *  DICTATION: .Note written in EPIC  PLAN OF CARE: Discharge to home after PACU  PATIENT DISPOSITION:  PACU - hemodynamically stable.   Delay start of Pharmacological VTE agent (>24hrs) due to surgical blood loss or risk of bleeding: not applicable

## 2013-05-26 NOTE — Discharge Instructions (Signed)
Menorrhagia  Menorrhagia is the medical term for when your menstrual periods are heavy or last longer than usual. With menorrhagia, every period you have may cause enough blood loss and cramping that you are unable to maintain your usual activities.  CAUSES   In some cases, the cause of heavy periods is unknown, but a number of conditions may cause menorrhagia. Common causes include:  · A problem with the hormone-producing thyroid gland (hypothyroid).  · Noncancerous growths in the uterus (polyps or fibroids).  · An imbalance of the estrogen and progesterone hormones.  · One of your ovaries not releasing an egg during one or more months.  · Side effects of having an intrauterine device (IUD).  · Side effects of some medicines, such as anti-inflammatory medicines or blood thinners.  · A bleeding disorder that stops your blood from clotting normally.  SIGNS AND SYMPTOMS   During a normal period, bleeding lasts between 4 and 8 days. Signs that your periods are too heavy include:  · You routinely have to change your pad or tampon every 1 or 2 hours because it is completely soaked.  · You pass blood clots larger than 1 inch (2.5 cm) in size.  · You have bleeding for more than 7 days.  · You need to use pads and tampons at the same time because of heavy bleeding.  · You need to wake up to change your pads or tampons during the night.  · You have symptoms of anemia, such as tiredness, fatigue, or shortness of breath.   DIAGNOSIS   Your health care provider will perform a physical exam and ask you questions about your symptoms and menstrual history. Other tests may be ordered based on what the health care provider finds during the exam. These tests can include:  · Blood tests To check if you are pregnant or have hormonal changes, a bleeding or thyroid disorder, low iron levels (anemia), or other problems.  · Endometrial biopsy Your health care provider takes a sample of tissue from the inside of your uterus to be examined  under a microscope.  · Pelvic ultrasound This test uses sound waves to make a picture of your uterus, ovaries, and vagina. The pictures can show if you have fibroids or other growths.  · Hysteroscopy For this test, your health care provider will use a small telescope to look inside your uterus.  Based on the results of your initial tests, your health care provider may recommend further testing.  TREATMENT   Treatment may not be needed. If it is needed, your health care provider may recommend treatment with one or more medicines first. If these do not reduce bleeding enough, a surgical treatment might be an option. The best treatment for you will depend on:   · Whether you need to prevent pregnancy.    · Your desire to have children in the future.  · The cause and severity of your bleeding.  · Your opinion and personal preference.    Medicines for menorrhagia may include:  · Birth control methods that use hormones These include the pill, skin patch, vaginal ring, shots that you get every 3 months, hormonal IUD, and implant. These treatments reduce bleeding during your menstrual period.  · Medicines that thicken blood and slow bleeding.  · Medicines that reduce swelling, such as ibuprofen.   · Medicines that contain a synthetic hormone called progestin.    · Medicines that make the ovaries stop working for a short time.    You may need surgical   treatment for menorrhagia if the medicines are unsuccessful. Treatment options include:  · Dilation and curettage (D&C) In this procedure, your health care provider opens (dilates) your cervix and then scrapes or suctions tissue from the lining of your uterus to reduce menstrual bleeding.  · Operative hysteroscopy This procedure uses a tiny tube with a light (hysteroscope) to view your uterine cavity and can help in the surgical removal of a polyp that may be causing heavy periods.  · Endometrial ablation Through various techniques, your health care provider permanently  destroys the entire lining of your uterus (endometrium). After endometrial ablation, most women have little or no menstrual flow. Endometrial ablation reduces your ability to become pregnant.  · Endometrial resection This surgical procedure uses an electrosurgical wire loop to remove the lining of the uterus. This procedure also reduces your ability to become pregnant.  · Hysterectomy Surgical removal of the uterus and cervix is a permanent procedure that stops menstrual periods. Pregnancy is not possible after a hysterectomy. This procedure requires anesthesia and hospitalization.  HOME CARE INSTRUCTIONS   · Only take over-the-counter or prescription medicines as directed by your health care provider. Take prescribed medicines exactly as directed. Do not change or switch medicines without consulting your health care provider.  · Take any prescribed iron pills exactly as directed by your health care provider. Long-term heavy bleeding may result in low iron levels. Iron pills help replace the iron your body lost from heavy bleeding. Iron may cause constipation. If this becomes a problem, increase the bran, fruits, and roughage in your diet.  · Do not take aspirin or medicines that contain aspirin 1 week before or during your menstrual period. Aspirin may make the bleeding worse.  · If you need to change your sanitary pad or tampon more than once every 2 hours, stay in bed and rest as much as possible until the bleeding stops.  · Eat well-balanced meals. Eat foods high in iron. Examples are leafy green vegetables, meat, liver, eggs, and whole grain breads and cereals. Do not try to lose weight until the abnormal bleeding has stopped and your blood iron level is back to normal.  SEEK MEDICAL CARE IF:   · You soak through a pad or tampon every 1 or 2 hours, and this happens every time you have a period.  · You need to use pads and tampons at the same time because you are bleeding so much.  · You need to change your pad  or tampon during the night.  · You have a period that lasts for more than 8 days.  · You pass clots bigger than 1 inch wide.  · You have irregular periods that happen more or less often than once a month.  · You feel dizzy or faint.  · You feel very weak or tired.  · You feel short of breath or feel your heart is beating too fast when you exercise.  · You have nausea and vomiting or diarrhea while you are taking your medicine.  · You have any problems that may be related to the medicine you are taking.  SEEK IMMEDIATE MEDICAL CARE IF:   · You soak through 4 or more pads or tampons in 2 hours.  · You have any bleeding while you are pregnant.  MAKE SURE YOU:   · Understand these instructions.  · Will watch your condition.  · Will get help right away if you are not doing well or get worse.    Document Released: 04/03/2005 Document Revised: 01/22/2013 Document Reviewed: 09/22/2012  [employer]® Patient Information ©2014 [employer].

## 2013-05-26 NOTE — H&P (Signed)
Patient ID: Danielle Reilly, female DOB: 19-Dec-1975, 38 y.o. MRN: [id number]  HPI Pt s/p hysteroscopy with dilation and currettage and failed ablation possibly due to occult uterine perforation. Pt wants to attempt ablation again. She denies having abdominal pain or cramping but reports continued bleeding. She wants the bleeding to stop. She is not interested in trying the IUD or other treatemts at this time. She is aware that if this fails she cannot have an IUD and the next step is a hyst.  Past Medical History  Diagnosis Date  . Anemia   . GERD (gastroesophageal reflux disease)     tums as needed   Past Surgical History  Procedure Laterality Date  . Laparoscopy for ectopic pregnancy    . Laparoscopy  12/25/2010    Procedure: LAPAROSCOPY OPERATIVE;  Surgeon: Roseanna Rainbow, MD;  Location: [facility];  Service: Gynecology;  Laterality: N/A;  Operative laparoscopy /cauterization of right hemorrhagic cyst wall. Removal of belly rings.  . Uterine biopsy    . Dilitation & currettage/hystroscopy with novasure ablation N/A 04/04/2013    Procedure: DILATATION & CURETTAGE/HYSTEROSCOPY WITH Attempted NOVASURE ABLATION ;  Surgeon: Willodean Rosenthal, MD;  Location: [facility];  Service: Gynecology;  Laterality: N/A;   No current facility-administered medications on file prior to encounter.   Current Outpatient Prescriptions on File Prior to Encounter  Medication Sig Dispense Refill  . ibuprofen (ADVIL,MOTRIN) 600 MG tablet Take 1 tablet (600 mg total) by mouth every 6 (six) hours as needed for pain.  30 tablet  0   No Known Allergies History   Social History  . Marital Status: Legally Separated    Spouse Name: N/A    Number of Children: N/A  . Years of Education: N/A   Occupational History  . Not on file.   Social History Main Topics  . Smoking status: Never Smoker   . Smokeless tobacco: Never Used  . Alcohol Use: 1.8 oz/week    3 Glasses of wine per week  . Drug Use: No  . Sexual Activity:  Not Currently    Birth Control/ Protection: Condom   Other Topics Concern  . Not on file   Social History Narrative  . No narrative on file   Family History  Problem Relation Age of Onset  . Drug abuse Mother   . Arthritis Maternal Aunt   . Cancer Maternal Aunt   . Drug abuse Maternal Aunt   . Arthritis Maternal Uncle   . Alcohol abuse Maternal Uncle   . Cancer Maternal Uncle   . Drug abuse Maternal Uncle   . Cancer Maternal Grandmother   . Alcohol abuse Maternal Grandfather   . Cancer Maternal Grandfather     Review of Systems  Objective:   Physical Exam BP 120/69  Pulse 57  Temp(Src) 97.7 F (36.5 C) (Oral)  Resp 16  SpO2 100%  Exam deferred  04/14/2013  Diagnosis  Endometrium, curettage  - INACTIVE ENDOMETRIUM WITH PROGESTATIONAL EFFECT, BENIGN SQUAMOUS FRAGMENTS  AND ABUNDANT BLOOD.  - BENIGN ADIPOSE TISSUE.  - SEE MICROSCOPIC DESCRIPTION.  Assessment:   DUB- pt desires ablation. No need to repeat D&C. Reviewed treatment options. Pt declines other treatment  Plan:   Patient desires surgical management with endometrial ablation.  The risks of surgery were discussed in detail with the patient including but not limited to: uterine perforation; bleeding which may require transfusion or reoperation; infection which may require prolonged hospitalization or re-hospitalization and antibiotic therapy; injury to bowel, bladder, ureters and major  vessels or other surrounding organs; need for additional procedures including laparotomy; thromboembolic phenomenon, incisional problems and other postoperative or anesthesia complications.  Patient was told that the likelihood that her condition and symptoms will be treated effectively with this surgical management was very high; the postoperative expectations were also discussed in detail. The patient also understands the alternative treatment options which were discussed in full. All questions were answered.

## 2013-05-26 NOTE — Anesthesia Preprocedure Evaluation (Signed)
Anesthesia Evaluation    Reviewed: Allergy & Precautions, H&P , NPO status , Patient's Chart, lab work & pertinent test results  Airway Mallampati: I TM Distance: >3 FB Neck ROM: full    Dental no notable dental hx. (+) Teeth Intact   Pulmonary neg pulmonary ROS,    Pulmonary exam normal       Cardiovascular negative cardio ROS      Neuro/Psych negative neurological ROS  negative psych ROS   GI/Hepatic negative GI ROS, Neg liver ROS,   Endo/Other  negative endocrine ROS  Renal/GU negative Renal ROS     Musculoskeletal   Abdominal Normal abdominal exam  (+)   Peds  Hematology   Anesthesia Other Findings   Reproductive/Obstetrics negative OB ROS                           Anesthesia Physical Anesthesia Plan  ASA: II  Anesthesia Plan: General   Post-op Pain Management:    Induction: Intravenous  Airway Management Planned: LMA  Additional Equipment:   Intra-op Plan:   Post-operative Plan:   Informed Consent: I have reviewed the patients History and Physical, chart, labs and discussed the procedure including the risks, benefits and alternatives for the proposed anesthesia with the patient or authorized representative who has indicated his/her understanding and acceptance.     Plan Discussed with: CRNA and Surgeon  Anesthesia Plan Comments:         Anesthesia Quick Evaluation

## 2013-05-26 NOTE — Transfer of Care (Signed)
Immediate Anesthesia Transfer of Care Note  Patient: Danielle Reilly  Procedure(s) Performed: Procedure(s): UNSUCCESSFUL NOVASURE ABLATION (N/A) UNSUCCESSFUL HYSTEROSCOPY WITH HYDROTHERMAL ABLATION (N/A)  Patient Location: PACU  Anesthesia Type:General  Level of Consciousness: awake, alert  and oriented  Airway & Oxygen Therapy: Patient Spontanous Breathing and Patient connected to nasal cannula oxygen  Post-op Assessment: Report given to PACU RN and Post -op Vital signs reviewed and stable  Post vital signs: Reviewed and stable  Complications: No apparent anesthesia complications

## 2013-05-26 NOTE — Anesthesia Postprocedure Evaluation (Signed)
  Anesthesia Post Note  Patient: Danielle Reilly  Procedure(s) Performed: Procedure(s) (LRB): UNSUCCESSFUL NOVASURE ABLATION (N/A) UNSUCCESSFUL HYSTEROSCOPY WITH HYDROTHERMAL ABLATION (N/A)  Anesthesia type: GA  Patient location: PACU  Post pain: Pain level controlled  Post assessment: Post-op Vital signs reviewed  Last Vitals:  Filed Vitals:   05/26/13 1300  BP: 102/50  Pulse: 65  Temp: 36.3 C  Resp: 18    Post vital signs: Reviewed  Level of consciousness: sedated  Complications: No apparent anesthesia complications

## 2013-05-26 NOTE — Op Note (Signed)
05/26/2013  12:09 PM  PATIENT:  Danielle Reilly  38 y.o. female  PRE-OPERATIVE DIAGNOSIS:   for dysfunctional uterine leeding  POST-OPERATIVE DIAGNOSIS:   for dysfunctional uterine leeding  PROCEDURE:  Procedure(s): UNSUCCESSFUL NOVASURE ABLATION (N/A) UNSUCCESSFUL HYSTEROSCOPY WITH HYDROTHERMAL ABLATION (N/A)  SURGEON:  Surgeon(s) and Role:    * Willodean Rosenthalarolyn Harraway-Smith, MD - Primary    * Tereso NewcomerUgonna A Anyanwu, MD - Assisting  ANESTHESIA:   local  EBL:  Total I/O In: 1500 [I.V.:1500] Out: 95 [Urine:50; Blood:45]  BLOOD ADMINISTERED:none  DRAINS: none   LOCAL MEDICATIONS USED:  MARCAINE     SPECIMEN:  No Specimen  DISPOSITION OF SPECIMEN:  N/A  COUNTS:  YES  TOURNIQUET:  * No tourniquets in log *  DICTATION: .Note written in EPIC  PLAN OF CARE: Discharge to home after PACU  PATIENT DISPOSITION:  PACU - hemodynamically stable.   Delay start of Pharmacological VTE agent (>24hrs) due to surgical blood loss or risk of bleeding: not applicable   The risks, benefits, and alternatives of surgery were explained, understood, and accepted. The consents were signed and all questions were answered. She was taken to the operating room and general anesthesia was applied without complication. She was placed in the dorsal lithotomy position and her vagina and abdomen were prepped and draped in the usual sterile fashion. A bimanual exam revealed a normal size and shape anteverted mobile uterus. Her adnexa were non-enlarged.   A bivalved speculum was placed in the patients' vagina and the anterior lip of the cervix was grasped with a single toothed tenaculum. A paracervical block was performed at 5 and 7 o'clock with 20cc of 0.5% Marcaine.   The endometrial cavity was sounded to 11 cm and the endocervical length measured 3cm. A hysteroscopy was done 4-5 weeks previously.  The NovaSure device was then inserted and seated using 6.5cm as the cavity length and 4.6cm as the cavity width.  The device would  not seal despite several attempts to create and adequate seal.  The HTA device was then attempted and the cavity was visualized with note of an intact fundus.  Despite multiple attempts of trying to get a seal there, the device registered that no seal was obtained.  The patient was extubated and taken to the recovery room in stable condition.  Sponge, lap and instrument counts were correct.  There were no complications. Sabirin Baray L. Harraway-Smith, M.D., Evern CoreFACOG

## 2013-05-27 ENCOUNTER — Encounter (HOSPITAL_COMMUNITY): Payer: Self-pay | Admitting: Obstetrics & Gynecology

## 2013-05-28 ENCOUNTER — Telehealth: Payer: Self-pay | Admitting: *Deleted

## 2013-05-28 DIAGNOSIS — G8918 Other acute postprocedural pain: Secondary | ICD-10-CM

## 2013-05-28 MED ORDER — IBUPROFEN 600 MG PO TABS: 600.0000 mg | ORAL_TABLET | Freq: Four times a day (QID) | ORAL | Status: DC | PRN

## 2013-05-28 NOTE — Telephone Encounter (Signed)
Patient called and stated that she didn't get her prescription for ibuprofen. Upon chart review I see that the rx was printed and not sent electronically. Patient did not get the written rx. Rx resent electronically .

## 2013-06-09 ENCOUNTER — Encounter (HOSPITAL_COMMUNITY): Payer: Self-pay

## 2013-06-09 ENCOUNTER — Encounter (HOSPITAL_COMMUNITY)
Admission: RE | Admit: 2013-06-09 | Discharge: 2013-06-09 | Disposition: A | Discharge status: Home / Self Care | Payer: Self-pay | Source: Ambulatory Visit | Attending: Obstetrics & Gynecology | Admitting: Obstetrics & Gynecology

## 2013-06-09 DIAGNOSIS — Z01812 Encounter for preprocedural laboratory examination: Secondary | ICD-10-CM | POA: Insufficient documentation

## 2013-06-09 LAB — CBC
HEMATOCRIT: 33.5 % — AB (ref 36.0–46.0)
HEMOGLOBIN: 10.9 g/dL — AB (ref 12.0–15.0)
MCH: 24.1 pg — ABNORMAL LOW (ref 26.0–34.0)
MCHC: 32.5 g/dL (ref 30.0–36.0)
MCV: 74.1 fL — ABNORMAL LOW (ref 78.0–100.0)
Platelets: 286 10*3/uL (ref 150–400)
RBC: 4.52 MIL/uL (ref 3.87–5.11)
RDW: 17.3 % — ABNORMAL HIGH (ref 11.5–15.5)
WBC: 7.1 10*3/uL (ref 4.0–10.5)

## 2013-06-09 NOTE — Patient Instructions (Addendum)
   Your procedure is scheduled on:  Thursday, March 5  [facility]  0815 Pick up the phone at the desk and dial [phone] and inform us of your arrival.  Please call this number if you have any problems the morning of surgery: [phone]  Remember: Do not eat or drink after midnight: Wednesday Take these medicines the morning of surgery with a SIP OF WATER:  None  Do not wear jewelry, make-up, or FINGER nail polish No metal in your hair or on your body.   Do not wear lotions, powders, perfumes.  You may wear deodorant. Do not bring valuables to the hospital. Contacts, dentures or bridgework may not be worn into surgery.  Leave suitcase in the car. After Surgery it may be brought to your room. For patients being admitted to the hospital, checkout time is 11:00am the day of discharge.  Home with sister Arty BaumgartnerShontez  cell [phone].

## 2013-06-17 ENCOUNTER — Ambulatory Visit: Payer: Self-pay

## 2013-06-18 NOTE — H&P (Signed)
Patient ID: Danielle Reilly, female DOB: 09/26/1975, 38 y.o. MRN: [id number]  HPI Pt s/p hysteroscopy with dilation and currettage and failed ablation possibly due to occult uterine perforation. Pt failed ablation x 2. She denies having abdominal pain or cramping but reports continued bleeding. She wants the bleeding to stop. She is not interested in trying the IUD or other treatemts at this time. She wants definitive care.   Past Medical History   Diagnosis  Date   .  Anemia    .  GERD (gastroesophageal reflux disease)      tums as needed    Past Surgical History   Procedure  Laterality  Date   .  Laparoscopy for ectopic pregnancy     .  Laparoscopy   12/25/2010     Procedure: LAPAROSCOPY OPERATIVE; Surgeon: Roseanna RainbowLisa A Jackson-Moore, MD; Location: [facility]; Service: Gynecology; Laterality: N/A; Operative laparoscopy /cauterization of right hemorrhagic cyst wall. Removal of belly rings.   .  Uterine biopsy     .  Dilitation & currettage/hystroscopy with novasure ablation  N/A  04/04/2013     Procedure: DILATATION & CURETTAGE/HYSTEROSCOPY WITH Attempted NOVASURE ABLATION ; Surgeon: Willodean Rosenthalarolyn Harraway-Smith, MD; Location: [facility]; Service: Gynecology; Laterality: N/A;    No current facility-administered medications on file prior to encounter.    Current Outpatient Prescriptions on File Prior to Encounter   Medication  Sig  Dispense  Refill   .  ibuprofen (ADVIL,MOTRIN) 600 MG tablet  Take 1 tablet (600 mg total) by mouth every 6 (six) hours as needed for pain.  30 tablet  0   No Known Allergies  History    Social History   .  Marital Status:  Legally Separated     Spouse Name:  N/A     Number of Children:  N/A   .  Years of Education:  N/A    Occupational History   .  Not on file.    Social History Main Topics   .  Smoking status:  Never Smoker   .  Smokeless tobacco:  Never Used   .  Alcohol Use:  1.8 oz/week     3 Glasses of wine per week   .  Drug Use:  No   .  Sexual Activity:  Not  Currently     Birth Control/ Protection:  Condom    Other Topics  Concern   .  Not on file    Social History Narrative   .  No narrative on file    Family History   Problem  Relation  Age of Onset   .  Drug abuse  Mother    .  Arthritis  Maternal Aunt    .  Cancer  Maternal Aunt    .  Drug abuse  Maternal Aunt    .  Arthritis  Maternal Uncle    .  Alcohol abuse  Maternal Uncle    .  Cancer  Maternal Uncle    .  Drug abuse  Maternal Uncle    .  Cancer  Maternal Grandmother    .  Alcohol abuse  Maternal Grandfather    .  Cancer  Maternal Grandfather    Review of Systems  Objective:   Physical Exam BP 120/69  Pulse 57  Temp(Src) 97.7 F (36.5 C) (Oral)  Resp 16  SpO2 100%  Exam deferred  Lungs: CTA CV: RRR Abd: soft, NT, ND   04/14/2013  Diagnosis  Endometrium, curettage  - INACTIVE ENDOMETRIUM  WITH PROGESTATIONAL EFFECT, BENIGN SQUAMOUS FRAGMENTS  AND ABUNDANT BLOOD.  - BENIGN ADIPOSE TISSUE.  - SEE MICROSCOPIC DESCRIPTION.  Assessment:   DUB- failed ablation x2.  Requests definitive treatment with TV[facility]  Plan:   Patient desires surgical management with Total Vaginal Hysterectomy with bilateral salpingectomy if possible. The risks of surgery were discussed in detail with the patient including but not limited to: uterine perforation; bleeding which may require transfusion or reoperation; infection which may require prolonged hospitalization or re-hospitalization and antibiotic therapy; injury to bowel, bladder, ureters and major vessels or other surrounding organs; need for additional procedures including laparotomy; thromboembolic phenomenon, incisional problems and other postoperative or anesthesia complications. Patient was told that the likelihood that her condition and symptoms will be treated effectively with this surgical management was very high; the postoperative expectations were also discussed in detail. The patient also understands the alternative treatment  options which were discussed in full. All questions were answered

## 2013-06-19 ENCOUNTER — Encounter (HOSPITAL_COMMUNITY): Payer: Self-pay | Admitting: Anesthesiology

## 2013-06-19 ENCOUNTER — Ambulatory Visit (HOSPITAL_COMMUNITY)
Admission: RE | Admit: 2013-06-19 | Discharge: 2013-06-19 | Disposition: A | Discharge status: Home / Self Care | Payer: Self-pay | Source: Ambulatory Visit | Attending: Obstetrics & Gynecology | Admitting: Obstetrics & Gynecology

## 2013-06-19 ENCOUNTER — Ambulatory Visit (HOSPITAL_COMMUNITY): Payer: Self-pay | Admitting: Anesthesiology

## 2013-06-19 ENCOUNTER — Encounter (HOSPITAL_COMMUNITY)
Admission: RE | Disposition: A | Discharge status: Home / Self Care | Payer: Self-pay | Source: Ambulatory Visit | Attending: Obstetrics & Gynecology

## 2013-06-19 DIAGNOSIS — N938 Other specified abnormal uterine and vaginal bleeding: Secondary | ICD-10-CM | POA: Insufficient documentation

## 2013-06-19 DIAGNOSIS — Z9889 Other specified postprocedural states: Secondary | ICD-10-CM

## 2013-06-19 DIAGNOSIS — N939 Abnormal uterine and vaginal bleeding, unspecified: Secondary | ICD-10-CM

## 2013-06-19 DIAGNOSIS — N8000 Endometriosis of the uterus, unspecified: Secondary | ICD-10-CM | POA: Insufficient documentation

## 2013-06-19 DIAGNOSIS — N949 Unspecified condition associated with female genital organs and menstrual cycle: Secondary | ICD-10-CM | POA: Insufficient documentation

## 2013-06-19 DIAGNOSIS — N925 Other specified irregular menstruation: Secondary | ICD-10-CM | POA: Insufficient documentation

## 2013-06-19 DIAGNOSIS — D649 Anemia, unspecified: Secondary | ICD-10-CM | POA: Insufficient documentation

## 2013-06-19 DIAGNOSIS — K219 Gastro-esophageal reflux disease without esophagitis: Secondary | ICD-10-CM | POA: Insufficient documentation

## 2013-06-19 HISTORY — PX: VAGINAL HYSTERECTOMY: SHX2639

## 2013-06-19 HISTORY — PX: BILATERAL SALPINGECTOMY: SHX5743

## 2013-06-19 LAB — CBC
HCT: 34.6 % — ABNORMAL LOW (ref 36.0–46.0)
Hemoglobin: 11.4 g/dL — ABNORMAL LOW (ref 12.0–15.0)
MCH: 24.8 pg — AB (ref 26.0–34.0)
MCHC: 32.9 g/dL (ref 30.0–36.0)
MCV: 75.2 fL — AB (ref 78.0–100.0)
Platelets: 307 10*3/uL (ref 150–400)
RBC: 4.6 MIL/uL (ref 3.87–5.11)
RDW: 17.2 % — ABNORMAL HIGH (ref 11.5–15.5)
WBC: 5.7 10*3/uL (ref 4.0–10.5)

## 2013-06-19 LAB — PREGNANCY, URINE: PREG TEST UR: NEGATIVE

## 2013-06-19 LAB — TYPE AND SCREEN
ABO/RH(D): A POS
Antibody Screen: NEGATIVE

## 2013-06-19 SURGERY — HYSTERECTOMY, VAGINAL
Anesthesia: General | Site: Vagina

## 2013-06-19 MED ORDER — KETOROLAC TROMETHAMINE 30 MG/ML IJ SOLN
30.0000 mg | Freq: Four times a day (QID) | INTRAMUSCULAR | Status: DC
Start: 2013-06-19 — End: 2013-06-19

## 2013-06-19 MED ORDER — OXYCODONE-ACETAMINOPHEN 5-325 MG PO TABS: 1.0000 | ORAL_TABLET | ORAL | Status: DC | PRN

## 2013-06-19 MED ORDER — DEXTROSE 5 % IV SOLN
2.0000 g | INTRAVENOUS | Status: AC
  Administered 2013-06-19: 2 g via INTRAVENOUS
  Filled 2013-06-19: qty 2

## 2013-06-19 MED ORDER — LIDOCAINE HCL (CARDIAC) 20 MG/ML IV SOLN
INTRAVENOUS | Status: AC
  Filled 2013-06-19: qty 5

## 2013-06-19 MED ORDER — IBUPROFEN 600 MG PO TABS: 600.0000 mg | ORAL_TABLET | Freq: Four times a day (QID) | ORAL | Status: DC | PRN

## 2013-06-19 MED ORDER — DOCUSATE SODIUM 100 MG PO CAPS: 100.0000 mg | ORAL_CAPSULE | Freq: Two times a day (BID) | ORAL | Status: DC | PRN

## 2013-06-19 MED ORDER — METOCLOPRAMIDE HCL 5 MG/ML IJ SOLN: 10.0000 mg | Freq: Once | INTRAMUSCULAR | Status: DC | PRN

## 2013-06-19 MED ORDER — MEPERIDINE HCL 25 MG/ML IJ SOLN: 6.2500 mg | INTRAMUSCULAR | Status: DC | PRN

## 2013-06-19 MED ORDER — LACTATED RINGERS IV SOLN
INTRAVENOUS | Status: DC
  Administered 2013-06-19: 09:00:00 via INTRAVENOUS

## 2013-06-19 MED ORDER — FENTANYL CITRATE 0.05 MG/ML IJ SOLN
INTRAMUSCULAR | Status: AC
  Filled 2013-06-19: qty 5

## 2013-06-19 MED ORDER — MORPHINE SULFATE 4 MG/ML IJ SOLN
2.0000 mg | INTRAMUSCULAR | Status: DC | PRN
  Administered 2013-06-19: 2 mg via INTRAVENOUS
  Filled 2013-06-19: qty 1

## 2013-06-19 MED ORDER — DEXTROSE-NACL 5-0.45 % IV SOLN
INTRAVENOUS | Status: DC
Start: 2013-06-19 — End: 2013-06-19
  Administered 2013-06-19: 16:00:00 via INTRAVENOUS

## 2013-06-19 MED ORDER — ZOLPIDEM TARTRATE 5 MG PO TABS: 5.0000 mg | ORAL_TABLET | Freq: Every evening | ORAL | Status: DC | PRN

## 2013-06-19 MED ORDER — HYDROMORPHONE HCL PF 1 MG/ML IJ SOLN
INTRAMUSCULAR | Status: AC
  Filled 2013-06-19: qty 1

## 2013-06-19 MED ORDER — MIDAZOLAM HCL 2 MG/2ML IJ SOLN
INTRAMUSCULAR | Status: DC | PRN
  Administered 2013-06-19: 2 mg via INTRAVENOUS

## 2013-06-19 MED ORDER — LIDOCAINE HCL (CARDIAC) 20 MG/ML IV SOLN
INTRAVENOUS | Status: DC | PRN
  Administered 2013-06-19: 60 mg via INTRAVENOUS

## 2013-06-19 MED ORDER — GLYCOPYRROLATE 0.2 MG/ML IJ SOLN
INTRAMUSCULAR | Status: AC
  Filled 2013-06-19: qty 3

## 2013-06-19 MED ORDER — ACETAMINOPHEN 160 MG/5ML PO SOLN
975.0000 mg | Freq: Once | ORAL | Status: AC
  Administered 2013-06-19: 975 mg via ORAL

## 2013-06-19 MED ORDER — PROPOFOL 10 MG/ML IV EMUL
INTRAVENOUS | Status: AC
  Filled 2013-06-19: qty 20

## 2013-06-19 MED ORDER — ROCURONIUM BROMIDE 100 MG/10ML IV SOLN
INTRAVENOUS | Status: AC
  Filled 2013-06-19: qty 1

## 2013-06-19 MED ORDER — BUPIVACAINE HCL 0.5 % IJ SOLN
INTRAMUSCULAR | Status: DC | PRN
  Administered 2013-06-19: 30 mL

## 2013-06-19 MED ORDER — KETOROLAC TROMETHAMINE 30 MG/ML IJ SOLN: 15.0000 mg | Freq: Once | INTRAMUSCULAR | Status: DC | PRN

## 2013-06-19 MED ORDER — ACETAMINOPHEN 160 MG/5ML PO SOLN
ORAL | Status: AC
  Administered 2013-06-19: 975 mg via ORAL
  Filled 2013-06-19: qty 40.6

## 2013-06-19 MED ORDER — VASOPRESSIN 20 UNIT/ML IJ SOLN
INTRAVENOUS | Status: DC | PRN
  Administered 2013-06-19: 10:00:00

## 2013-06-19 MED ORDER — LACTATED RINGERS IV SOLN
INTRAVENOUS | Status: DC
  Administered 2013-06-19 (×5): via INTRAVENOUS

## 2013-06-19 MED ORDER — ONDANSETRON HCL 4 MG/2ML IJ SOLN
INTRAMUSCULAR | Status: AC
  Filled 2013-06-19: qty 2

## 2013-06-19 MED ORDER — DEXAMETHASONE SODIUM PHOSPHATE 10 MG/ML IJ SOLN
INTRAMUSCULAR | Status: AC
  Filled 2013-06-19: qty 1

## 2013-06-19 MED ORDER — HYDROMORPHONE HCL PF 1 MG/ML IJ SOLN
INTRAMUSCULAR | Status: AC
  Administered 2013-06-19: 0.5 mg via INTRAVENOUS
  Filled 2013-06-19: qty 1

## 2013-06-19 MED ORDER — BUPIVACAINE HCL (PF) 0.5 % IJ SOLN
INTRAMUSCULAR | Status: AC
  Filled 2013-06-19: qty 30

## 2013-06-19 MED ORDER — KETOROLAC TROMETHAMINE 30 MG/ML IJ SOLN: 30.0000 mg | Freq: Once | INTRAMUSCULAR | Status: DC

## 2013-06-19 MED ORDER — VASOPRESSIN 20 UNIT/ML IJ SOLN
INTRAMUSCULAR | Status: AC
  Filled 2013-06-19: qty 1

## 2013-06-19 MED ORDER — SODIUM CHLORIDE 0.9 % IJ SOLN
INTRAMUSCULAR | Status: AC
  Filled 2013-06-19: qty 50

## 2013-06-19 MED ORDER — POLYETHYLENE GLYCOL 3350 17 G PO PACK
17.0000 g | PACK | Freq: Every day | ORAL | Status: DC | PRN
  Filled 2013-06-19: qty 1

## 2013-06-19 MED ORDER — HYDROMORPHONE HCL PF 1 MG/ML IJ SOLN
0.2500 mg | INTRAMUSCULAR | Status: DC | PRN
  Administered 2013-06-19 (×3): 0.5 mg via INTRAVENOUS

## 2013-06-19 MED ORDER — NEOSTIGMINE METHYLSULFATE 1 MG/ML IJ SOLN
INTRAMUSCULAR | Status: AC
  Filled 2013-06-19: qty 1

## 2013-06-19 MED ORDER — KETOROLAC TROMETHAMINE 30 MG/ML IJ SOLN
INTRAMUSCULAR | Status: AC
  Filled 2013-06-19: qty 1

## 2013-06-19 MED ORDER — 0.9 % SODIUM CHLORIDE (POUR BTL) OPTIME
TOPICAL | Status: DC | PRN
  Administered 2013-06-19: 1000 mL

## 2013-06-19 MED ORDER — DSS 100 MG PO CAPS: 100.0000 mg | ORAL_CAPSULE | Freq: Two times a day (BID) | ORAL | Status: DC

## 2013-06-19 MED ORDER — SIMETHICONE 80 MG PO CHEW: 80.0000 mg | CHEWABLE_TABLET | Freq: Four times a day (QID) | ORAL | Status: DC | PRN

## 2013-06-19 MED ORDER — PANTOPRAZOLE SODIUM 40 MG PO TBEC
40.0000 mg | DELAYED_RELEASE_TABLET | Freq: Every day | ORAL | Status: DC
  Filled 2013-06-19 (×2): qty 1

## 2013-06-19 MED ORDER — PROPOFOL 10 MG/ML IV BOLUS
INTRAVENOUS | Status: DC | PRN
  Administered 2013-06-19: 150 mg via INTRAVENOUS

## 2013-06-19 MED ORDER — DOCUSATE SODIUM 100 MG PO CAPS: 100.0000 mg | ORAL_CAPSULE | Freq: Two times a day (BID) | ORAL | Status: DC

## 2013-06-19 MED ORDER — ONDANSETRON HCL 4 MG/2ML IJ SOLN
INTRAMUSCULAR | Status: DC | PRN
  Administered 2013-06-19: 4 mg via INTRAVENOUS

## 2013-06-19 MED ORDER — ESTRADIOL 0.1 MG/GM VA CREA
TOPICAL_CREAM | VAGINAL | Status: AC
  Filled 2013-06-19: qty 42.5

## 2013-06-19 MED ORDER — ONDANSETRON HCL 4 MG PO TABS
4.0000 mg | ORAL_TABLET | Freq: Four times a day (QID) | ORAL | Status: DC | PRN
  Administered 2013-06-19: 4 mg via ORAL
  Filled 2013-06-19: qty 1

## 2013-06-19 MED ORDER — MIDAZOLAM HCL 2 MG/2ML IJ SOLN
INTRAMUSCULAR | Status: AC
  Filled 2013-06-19: qty 2

## 2013-06-19 MED ORDER — FENTANYL CITRATE 0.05 MG/ML IJ SOLN
INTRAMUSCULAR | Status: DC | PRN
  Administered 2013-06-19 (×5): 50 ug via INTRAVENOUS

## 2013-06-19 MED ORDER — KETOROLAC TROMETHAMINE 30 MG/ML IJ SOLN
30.0000 mg | Freq: Four times a day (QID) | INTRAMUSCULAR | Status: DC
Start: 2013-06-19 — End: 2013-06-19
  Filled 2013-06-19: qty 1

## 2013-06-19 MED ORDER — ONDANSETRON HCL 4 MG/2ML IJ SOLN: 4.0000 mg | Freq: Four times a day (QID) | INTRAMUSCULAR | Status: DC | PRN

## 2013-06-19 MED ORDER — DEXAMETHASONE SODIUM PHOSPHATE 10 MG/ML IJ SOLN
INTRAMUSCULAR | Status: DC | PRN
  Administered 2013-06-19: 10 mg via INTRAVENOUS

## 2013-06-19 MED ORDER — MENTHOL 3 MG MT LOZG: 1.0000 | LOZENGE | OROMUCOSAL | Status: DC | PRN

## 2013-06-19 MED ORDER — ROCURONIUM BROMIDE 100 MG/10ML IV SOLN
INTRAVENOUS | Status: DC | PRN
  Administered 2013-06-19: 40 mg via INTRAVENOUS

## 2013-06-19 SURGICAL SUPPLY — 31 items
CANISTER SUCT 3000ML (MISCELLANEOUS) ×3 IMPLANT
CLOTH BEACON ORANGE TIMEOUT ST (SAFETY) ×3 IMPLANT
CONT PATH 16OZ SNAP LID 3702 (MISCELLANEOUS) IMPLANT
COVER LIGHT HANDLE  1/PK (MISCELLANEOUS) ×2
COVER LIGHT HANDLE 1/PK (MISCELLANEOUS) ×1 IMPLANT
COVER MAYO STAND STRL (DRAPES) ×3 IMPLANT
DECANTER SPIKE VIAL GLASS SM (MISCELLANEOUS) IMPLANT
DRAPE HYSTEROSCOPY (DRAPE) ×3 IMPLANT
DRSG TELFA 3X8 NADH (GAUZE/BANDAGES/DRESSINGS) ×3 IMPLANT
GAUZE PACKING 2X5 YD STRL (GAUZE/BANDAGES/DRESSINGS) IMPLANT
GAUZE SPONGE 4X4 16PLY XRAY LF (GAUZE/BANDAGES/DRESSINGS) ×3 IMPLANT
GLOVE BIO SURGEON STRL SZ7 (GLOVE) ×3 IMPLANT
GLOVE BIOGEL PI IND STRL 6.5 (GLOVE) ×1 IMPLANT
GLOVE BIOGEL PI IND STRL 7.0 (GLOVE) ×1 IMPLANT
GLOVE BIOGEL PI INDICATOR 6.5 (GLOVE) ×2
GLOVE BIOGEL PI INDICATOR 7.0 (GLOVE) ×2
GOWN STRL REUS W/ TWL XL LVL3 (GOWN DISPOSABLE) ×1 IMPLANT
GOWN STRL REUS W/TWL LRG LVL3 (GOWN DISPOSABLE) ×9 IMPLANT
GOWN STRL REUS W/TWL XL LVL3 (GOWN DISPOSABLE) ×2
NEEDLE SPNL 20GX3.5 QUINCKE YW (NEEDLE) IMPLANT
NS IRRIG 1000ML POUR BTL (IV SOLUTION) ×3 IMPLANT
PACK VAGINAL WOMENS (CUSTOM PROCEDURE TRAY) ×3 IMPLANT
PAD OB MATERNITY 4.3X12.25 (PERSONAL CARE ITEMS) ×3 IMPLANT
SUT VIC AB 0 CT1 18XCR BRD8 (SUTURE) ×3 IMPLANT
SUT VIC AB 0 CT1 36 (SUTURE) ×6 IMPLANT
SUT VIC AB 0 CT1 8-18 (SUTURE) ×6
SUT VICRYL 0 TIES 12 18 (SUTURE) ×3 IMPLANT
SYR 30ML LL (SYRINGE) IMPLANT
TOWEL OR 17X24 6PK STRL BLUE (TOWEL DISPOSABLE) ×6 IMPLANT
TRAY FOLEY CATH 14FR (SET/KITS/TRAYS/PACK) ×3 IMPLANT
WATER STERILE IRR 1000ML POUR (IV SOLUTION) ×3 IMPLANT

## 2013-06-19 NOTE — Op Note (Signed)
Danielle Reilly PROCEDURE DATE: 06/19/2013  06/19/2013  11:53 AM  PATIENT:  Danielle Reilly  38 y.o. female  PRE-OPERATIVE DIAGNOSIS:  DYSFUNCTIONAL UTERINE BLEEDING  POST-OPERATIVE DIAGNOSIS:  DYSFUNCTIONAL UTERINE BLEEDING  PROCEDURE:  Procedure(s):  TOTAL VAGINAL HYSTERECTOMY with bilateral salpingectomy (N/A) BILATERAL SALPINGECTOMY (N/A)  SURGEON:  Surgeon(s) and Role:    * Willodean Rosenthalarolyn Harraway-Smith, MD - Primary    * Allie BossierMyra C Dove, MD - Assisting  ANESTHESIA:   local and general  EBL:  Total I/O In: 2100 [I.V.:2100] Out: 375 [Urine:125; Blood:250]  BLOOD ADMINISTERED:none  DRAINS: none   LOCAL MEDICATIONS USED:  MARCAINE    and OTHER dilute vasopressin solution  SPECIMEN:  Source of Specimen:  uterus and right fallopian tube  DISPOSITION OF SPECIMEN:  PATHOLOGY  COUNTS:  YES  TOURNIQUET:  * No tourniquets in log *  DICTATION: .Note written in EPIC  PLAN OF CARE: prolonged observation than discharge to home  PATIENT DISPOSITION:  PACU - hemodynamically stable.   Delay start of Pharmacological VTE agent (>24hrs) due to surgical blood loss or risk of bleeding: not applicable  INDICATIONS: The patient is a 38 y.o. [id number] with history of symptomatic uterine fibroids/menorrhagia. Patient with history of failed endometrial ablation x2.   The patient made a decision to undergo definite surgical treatment. On the preoperative visit, the risks, benefits, indications, and alternatives of the procedure were reviewed with the patient.  On the day of surgery, the risks of surgery were again discussed with the patient including but not limited to: bleeding which may require transfusion or reoperation; infection which may require antibiotics; injury to bowel, bladder, ureters or other surrounding organs; need for additional procedures; thromboembolic phenomenon, incisional problems and other postoperative/anesthesia complications. Written informed consent was obtained.    OPERATIVE FINDINGS: A  8-10 week size uterus with normal right fallopian tube. The left tube had been surgically removed previously.  Normal ovaries bilaterally.  DESCRIPTION OF PROCEDURE:  The patient received intravenous antibiotics and had sequential compression devices applied to her lower extremities while in the preoperative area.  She was then taken to the operating room where general anesthesia was administered and was found to be adequate.  She was placed in the dorsal lithotomy position, and was prepped and draped in a sterile manner.  The patients bladder was drained with a red rubber catheter. After an adequate timeout was performed, attention was turned to her pelvis.  A weighted speculum was then placed in the vagina, and the anterior and posterior lips of the cervix were grasped bilaterally with tenaculums. A paracervical block of 30cc of 0.5% Marcain was used.  The cervix was then injected circumferentially with a dilute Vasopression solution.  The cervix was then circumferentially incised, and the bladder was dissected off the pubocervical fascia without complication.  Th posterior cul-de-sac was entered sharply without difficulty. A suture was placed on the posterior vagina.  A long weighted speculum was inserted into the posterior cul-de-sac.  The Heaney clamp was then used to clamp the uterosacral ligaments on either side.  They were then cut and sutured ligated with 0 Vicryl, and the ligated uterosacral ligaments were transfixed to the posterior lateral vaginal epithelium to further support the vagina and provide hemostasis. Of note, all sutures used in this case were 0 Vicryl unless otherwise noted.   The cardinal ligaments were then clamped, cut and ligated. The anterior cul-de-sac was then entered sharpely. The uterine vessels and broad ligaments were then serially clamped with the Heaney clamps, cut,  and suture ligated on both sides.  Excellent hemostasis was noted at this point.  Due to the size of the uterus,  it was morcellated using a coring technique.  The uterus was then delivered via the posterior cul-de-sac, and the cornua were clamped with the Heaney clamps, transected, and the uterus was delivered and sent to pathology. These pedicles were then suture ligated to ensure hemostasis.  After completion of the hysterectomy, The fallopian tube on the right side was grasped with a Kelly clamp, transected and suture ligated.  All pedicles from the uterosacral ligament to the cornua were examined hemostasis was confirmed.  The vaginal cuff was reefed in a running locked fashion then reapproximated using figure of eight sutures care was given to incorporate the uterosacral pedicles bilaterally.  All instruments were then removed from the pelvis and a vaginal packing saturated with estrogen cream was placed.  The patient tolerated the procedure well.  All instruments, needles, and sponge counts were correct x 2. The patient was taken to the recovery room in stable condition.  There were no immediate complications noted.

## 2013-06-19 NOTE — Anesthesia Preprocedure Evaluation (Signed)
Anesthesia Evaluation  Patient identified by MRN, date of birth, ID band Patient awake    Reviewed: Allergy & Precautions, H&P , NPO status , Patient's Chart, lab work & pertinent test results, reviewed documented beta blocker date and time   History of Anesthesia Complications (+) history of anesthetic complications (very sensitive to medications (paradoxical reaction) - make her hyper.  Couldnt sleep after GA)  Airway Mallampati: II TM Distance: >3 FB Neck ROM: full    Dental  (+) Teeth Intact   Pulmonary neg pulmonary ROS,  breath sounds clear to auscultation  Pulmonary exam normal       Cardiovascular Exercise Tolerance: Good negative cardio ROS  Rhythm:regular Rate:Normal     Neuro/Psych negative neurological ROS  negative psych ROS   GI/Hepatic negative GI ROS, Neg liver ROS, GERD-  ,  Endo/Other  negative endocrine ROS  Renal/GU negative Renal ROS  Female GU complaint     Musculoskeletal   Abdominal   Peds  Hematology  (+) anemia ,   Anesthesia Other Findings   Reproductive/Obstetrics negative OB ROS                           Anesthesia Physical Anesthesia Plan  ASA: II  Anesthesia Plan: General ETT   Post-op Pain Management:    Induction:   Airway Management Planned:   Additional Equipment:   Intra-op Plan:   Post-operative Plan:   Informed Consent: I have reviewed the patients History and Physical, chart, labs and discussed the procedure including the risks, benefits and alternatives for the proposed anesthesia with the patient or authorized representative who has indicated his/her understanding and acceptance.   Dental Advisory Given  Plan Discussed with: CRNA and Surgeon  Anesthesia Plan Comments:         Anesthesia Quick Evaluation

## 2013-06-19 NOTE — Transfer of Care (Signed)
Immediate Anesthesia Transfer of Care Note  Patient: Danielle Reilly  Procedure(s) Performed: Procedure(s):  TOTAL VAGINAL HYSTERECTOMY with bilateral salpingectomy (N/A) BILATERAL SALPINGECTOMY (N/A)  Patient Location: PACU  Anesthesia Type:General  Level of Consciousness: awake, oriented and patient cooperative  Airway & Oxygen Therapy: Patient Spontanous Breathing and Patient connected to nasal cannula oxygen  Post-op Assessment: Report given to PACU RN and Post -op Vital signs reviewed and stable  Post vital signs: Reviewed and stable  Complications: No apparent anesthesia complications

## 2013-06-19 NOTE — Discharge Instructions (Signed)
Laparoscopically Assisted Vaginal Hysterectomy, Care After °Refer to this sheet in the next few weeks. These instructions provide you with information on caring for yourself after your procedure. Your health care provider may also give you more specific instructions. Your treatment has been planned according to current medical practices, but problems sometimes occur. Call your health care provider if you have any problems or questions after your procedure. °WHAT TO EXPECT AFTER THE PROCEDURE °After your procedure, it is typical to have the following: °· Abdominal pain. You will be given pain medicine to control it. °· Sore throat from the breathing tube that was inserted during surgery. °HOME CARE INSTRUCTIONS °· Only take over-the-counter or prescription medicines for pain, discomfort, or fever as directed by your health care provider. °· Do not take aspirin. It can cause bleeding. °· Do not drive when taking pain medicine. °· Follow your health care provider's advice regarding diet, exercise, lifting, driving, and general activities. °· Resume your usual diet as directed and allowed. °· Get plenty of rest and sleep. °· Do not douche, use tampons, or have sexual intercourse for at least 6 weeks, or until your health care provider gives you permission. °· Change your bandages (dressings) as directed by your health care provider. °· Monitor your temperature and notify your health care provider of a fever. °· Take showers instead of baths for 2 3 weeks. °· Do not drink alcohol until your health care provider gives you permission. °· If you develop constipation, you may take a mild laxative with your health care provider's permission. Bran foods may help with constipation problems. Drinking enough fluids to keep your urine clear or pale yellow may help as well. °· Try to have someone home with you for 1 2 weeks to help around the house. °· Keep all of your follow-up appointments as directed by your health care  provider. °SEEK MEDICAL CARE IF:  °· You have swelling, redness, or increasing pain around your incision sites. °· You have pus coming from your incision. °· You notice a bad smell coming from your incision. °· Your incision breaks open. °· You feel dizzy or lightheaded. °· You have pain or bleeding when you urinate. °· You have persistent diarrhea. °· You have persistent nausea and vomiting. °· You have abnormal vaginal discharge. °· You have a rash. °· You have any type of abnormal reaction or develop an allergy to your medicine. °· You have poor pain control with your prescribed medicine. °SEEK IMMEDIATE MEDICAL CARE IF:  °· You have a fever. °· You have severe abdominal pain. °· You have chest pain. °· You have shortness of breath. °· You faint. °· You have pain, swelling, or redness in your leg. °· You have heavy vaginal bleeding with blood clots. °Document Released: 03/23/2011 Document Revised: 12/04/2012 Document Reviewed: 10/17/2012 °Exit[employer]® Patient Information ©2014 [employer]. ° °

## 2013-06-19 NOTE — Anesthesia Postprocedure Evaluation (Signed)
  Anesthesia Post-op Note  Anesthesia Post Note  Patient: Danielle Reilly  Procedure(s) Performed: Procedure(s) (LRB):  TOTAL VAGINAL HYSTERECTOMY with bilateral salpingectomy (N/A) BILATERAL SALPINGECTOMY (N/A)  Anesthesia type: General  Patient location: PACU  Post pain: Pain level controlled  Post assessment: Post-op Vital signs reviewed  Last Vitals:  Filed Vitals:   06/19/13 1145  BP: 117/63  Pulse: 85  Temp:   Resp: 14    Post vital signs: Reviewed  Level of consciousness: sedated  Complications: No apparent anesthesia complications

## 2013-06-19 NOTE — Brief Op Note (Signed)
06/19/2013  11:53 AM  PATIENT:  Danielle Reilly  38 y.o. female  PRE-OPERATIVE DIAGNOSIS:  DYSFUNCTIONAL UTERINE BLEEDING  POST-OPERATIVE DIAGNOSIS:  DYSFUNCTIONAL UTERINE BLEEDING  PROCEDURE:  Procedure(s):  TOTAL VAGINAL HYSTERECTOMY with bilateral salpingectomy (N/A) BILATERAL SALPINGECTOMY (N/A)  SURGEON:  Surgeon(s) and Role:    * Willodean Rosenthalarolyn Harraway-Smith, MD - Primary    * Allie BossierMyra C Dove, MD - Assisting  ANESTHESIA:   local and general  EBL:  Total I/O In: 2100 [I.V.:2100] Out: 375 [Urine:125; Blood:250]  BLOOD ADMINISTERED:none  DRAINS: none   LOCAL MEDICATIONS USED:  MARCAINE    and OTHER dilute vasopressin solution  SPECIMEN:  Source of Specimen:  uterus and right fallopian tube  DISPOSITION OF SPECIMEN:  PATHOLOGY  COUNTS:  YES  TOURNIQUET:  * No tourniquets in log *  DICTATION: .Note written in EPIC  PLAN OF CARE: prolonged observation than discharge to home  PATIENT DISPOSITION:  PACU - hemodynamically stable.   Delay start of Pharmacological VTE agent (>24hrs) due to surgical blood loss or risk of bleeding: not applicable

## 2013-06-19 NOTE — Progress Notes (Signed)
Patient discharged home with family. Discharge instructions reviewed with patient and questions answered as needed. Patient in stable condition. No home health or equipment needed. Taken down to main entrance via wheelchair by P. Cates, NT.

## 2013-06-20 ENCOUNTER — Encounter (HOSPITAL_COMMUNITY): Payer: Self-pay | Admitting: Obstetrics & Gynecology

## 2013-06-20 ENCOUNTER — Telehealth: Payer: Self-pay | Admitting: Obstetrics & Gynecology

## 2013-06-20 NOTE — Telephone Encounter (Signed)
T.C to check on the pt post op.   She c/o freq urination and reports that she feels increased pressure if she does not void.  Voiding large amounts.  Denies dysuria or hematuria.  Pt told to f/u if sx persist.  Pts pain is well controlled. Told that she could take motrin and Percocet at the same time.  She will f/u in 2 weeks or sooner prn

## 2013-06-20 NOTE — Discharge Summary (Signed)
Physician Discharge Summary  Patient ID: Danielle BasemanChaka Whelpley MRN: [id number] DOB/AGE: 38/12/1975 37 y.o.  Admit date: 06/19/2013 Discharge date: 06/20/2013  Admission Diagnoses: DUB  Discharge Diagnoses:  Active Problems:   Postoperative state   Discharged Condition: good  Hospital Course: [facility].  She tolerated the procedure well.  She tolerated a regular diet post op and was able to void without difficulty.  Her pain was managed with po pain meds.  She was subsequently discharge to home.  She stated that she is ready for discharge.    Consults: None  Significant Diagnostic Studies: none  Treatments: surgery: total vaginal hysterectomy with right partial salpingectomy  Discharge Exam: Blood pressure 105/48, pulse 61, temperature 98 F (36.7 C), temperature source Oral, resp. rate 18, height 5\' 5"  (1.651 m), weight 176 lb (79.833 kg), SpO2 98.00%. General appearance: alert and no distress GI: soft, non-tender; bowel sounds normal; no masses,  no organomegaly and normal post op  Disposition: 01-Home or Self Care  Discharge Orders   Future Orders Complete By Expires   Call MD for:  extreme fatigue  As directed    Call MD for:  persistant dizziness or light-headedness  As directed    Call MD for:  persistant nausea and vomiting  As directed    Call MD for:  severe uncontrolled pain  As directed    Call MD for:  temperature >100.4  As directed    Diet general  As directed    Driving Restrictions  As directed    Comments:     No driving for 2 weeks OR while on pain medication   Increase activity slowly  As directed    Lifting restrictions  As directed    Comments:     No heavy lifting   May shower / Bathe  As directed    Scheduling Instructions:     May shower.  NO tub baths.   Sexual Activity Restrictions  As directed    Comments:     NOTHING in the vagina for 6 weeks.  No intercourse for 6 weeks       Medication List    STOP taking  these medications       calcium carbonate 500 MG chewable tablet  Commonly known as:  TUMS - dosed in mg elemental calcium     megestrol 40 MG tablet  Commonly known as:  MEGACE      TAKE these medications       docusate sodium 100 MG capsule  Commonly known as:  COLACE  Take 1 capsule (100 mg total) by mouth 2 (two) times daily as needed.     DSS 100 MG Caps  Take 100 mg by mouth 2 (two) times daily.     ibuprofen 600 MG tablet  Commonly known as:  ADVIL,MOTRIN  Take 1 tablet (600 mg total) by mouth every 6 (six) hours as needed (mild pain).     multivitamin with minerals Tabs tablet  Take 1 tablet by mouth daily.     oxyCODONE-acetaminophen 5-325 MG per tablet  Commonly known as:  PERCOCET/ROXICET  Take 1-2 tablets by mouth every 4 (four) hours as needed for severe pain (moderate to severe pain (when tolerating fluids)).           Follow-up Information   Follow up with Willodean RosenthalHARRAWAY-SMITH, Aaditya Letizia, MD In 2 weeks.   Specialty:  Obstetrics and Gynecology   Contact information:   [address] [place]  [place] [address] [phone]       Signed: Willodean Rosenthal 06/20/2013, 8:02 PM

## 2013-06-25 NOTE — Addendum Note (Signed)
Addendum created 06/25/13 1808 by Dana AllanAmy Arrow Emmerich, MD   Modules edited: Anesthesia Attestations

## 2013-06-29 ENCOUNTER — Encounter: Payer: Self-pay | Admitting: *Deleted

## 2013-06-30 ENCOUNTER — Telehealth: Payer: Self-pay | Admitting: General Practice

## 2013-06-30 NOTE — Telephone Encounter (Signed)
Patient called and left message stating she is coming up on her two week post op from hysterectomy and she just has a couple questions about a discharge she is having and about her limitations

## 2013-06-30 NOTE — Telephone Encounter (Signed)
Per chart review, patient needs 2 week post op scheduled. Appt made for 3/18 @ 1:15. Called patient and informed her of two week post op appt. Patient verbalized understanding and stated that she isn't having bleeding anymore but is having a brownish d/c. Told patient the brown d/c shows old blood and is part of the healing process since she does have a "wound" there. Patient verbalized understanding and asked if she could start driving to her appt on Wednesday because otherwise she can't get here. Told patient she could drive to her appt as long as she hasn't taken a percocet recently. Patient asked questions about exercise classes and I told her to discuss that with dr Erin Fullingharraway smith at her appt on Wednesday. Patient verbalized understanding and stated that she has been feeling well and doesn't really have pain anymore but is still having problems with the gas. Discussed high fiber and increase in water intake and that she could take something for gas OTC as well.patient verbalized understanding and had no further questions

## 2013-07-02 ENCOUNTER — Encounter: Payer: Self-pay | Admitting: Obstetrics & Gynecology

## 2013-07-02 ENCOUNTER — Ambulatory Visit (INDEPENDENT_AMBULATORY_CARE_PROVIDER_SITE_OTHER): Payer: Self-pay | Admitting: Obstetrics & Gynecology

## 2013-07-02 VITALS — BP 118/65 | HR 87 | Temp 98.0°F | Ht 65.0 in | Wt 172.7 lb

## 2013-07-02 DIAGNOSIS — Z9889 Other specified postprocedural states: Secondary | ICD-10-CM

## 2013-07-02 DIAGNOSIS — Z09 Encounter for follow-up examination after completed treatment for conditions other than malignant neoplasm: Secondary | ICD-10-CM

## 2013-07-02 NOTE — Progress Notes (Signed)
Subjective:     Patient ID: Danielle Reilly, female   DOB: 11/11/1975, 38 y.o.   MRN: [id number]  HPI Pt present for 2 weeks post op check.  She reports some vulvar irritation.   She denies abnormal discharge or odor.  She also complains of difficulty passing stools since she had surgery.  She is taking Colace but, does not feel like it has helped.    Review of Systems     Objective:   Physical ExamPt in NAD BP 118/65  Pulse 87  Temp(Src) 98 F (36.7 C) (Oral)  Ht 5\' 5"  (1.651 m)  Wt 172 lb 11.2 oz (78.336 kg)  BMI 28.74 kg/m2  LMP 08/15/2012 Pt in NAD GU: EGBUS: erythema on external genitalia. Vagina: no blood in vault; no abnormal discharge; sutures noted in cuff   06/19/2013 Diagnosis Uterus and cervix, with right fallopian tube - ADENOMYOSIS. - CERVIX: BENIGN SQUAMOUS MUCOSA AND ENDOCERVICAL MUCOSA, NO DYSPLASIA OR MALIGNANCY. - ENDOMETRIUM: BENIGN INACTIVE ENDOMETRIUM WITH DECIDUALIZED STROMA, NO EVIDENCE OF ATYPIA, HYPERPLASIA OR MALIGNANCY. - RIGHT FALLOPIAN TUBE: BENIGN FALLOPIAN TUBAL TISSUE, NO ATYPIA, HYPERPLASIA OR MALIGNANCY     Assessment:     2 week post op check- doing well      Plan:     F/u in 4 weeks  Monistat vulvar cream Miramax prn

## 2013-07-21 ENCOUNTER — Telehealth: Payer: Self-pay | Admitting: *Deleted

## 2013-07-21 NOTE — Telephone Encounter (Signed)
Pt called nurse in regards to discharge she is experiencing since her hysterectomy.  Attempted to call patient, left message that we were returning her call to the nurse line.

## 2013-07-21 NOTE — Telephone Encounter (Signed)
Contacted patient and returned phone call from nurse line, Patients states she is having brownish discharge. Pt reports having surgery 4 wks ago and has follow up appointment next week with provider.  Reassured patient and reviewed signs/symptoms of problems that would be concerning.  Pt verbalizes understanding.

## 2013-07-31 ENCOUNTER — Ambulatory Visit (INDEPENDENT_AMBULATORY_CARE_PROVIDER_SITE_OTHER): Payer: Self-pay | Admitting: Obstetrics & Gynecology

## 2013-07-31 ENCOUNTER — Encounter: Payer: Self-pay | Admitting: Obstetrics & Gynecology

## 2013-07-31 VITALS — Temp 97.5°F | Ht 65.0 in | Wt 172.7 lb

## 2013-07-31 DIAGNOSIS — Z09 Encounter for follow-up examination after completed treatment for conditions other than malignant neoplasm: Secondary | ICD-10-CM

## 2013-07-31 DIAGNOSIS — Z9889 Other specified postprocedural states: Secondary | ICD-10-CM

## 2013-07-31 NOTE — Patient Instructions (Signed)
Hysterectomy Information  A hysterectomy is a surgery in which your uterus is removed. This surgery may be done to treat various medical problems. After the surgery, you will no longer have menstrual periods. The surgery will also make you unable to become pregnant (sterile). The fallopian tubes and ovaries can be removed (bilateral salpingo-oophorectomy) during this surgery as well.  REASONS FOR A HYSTERECTOMY  Persistent, abnormal bleeding.  Lasting (chronic) pelvic pain or infection.  The lining of the uterus (endometrium) starts growing outside the uterus (endometriosis).  The endometrium starts growing in the muscle of the uterus (adenomyosis).  The uterus falls down into the vagina (pelvic organ prolapse).  Noncancerous growths in the uterus (uterine fibroids) that cause symptoms.  Precancerous cells.  Cervical cancer or uterine cancer. TYPES OF HYSTERECTOMIES  Supracervical hysterectomy In this type, the top part of the uterus is removed, but not the cervix.  Total hysterectomy The uterus and cervix are removed.  Radical hysterectomy The uterus, the cervix, and the fibrous tissue that holds the uterus in place in the pelvis (parametrium) are removed. WAYS A HYSTERECTOMY CAN BE PERFORMED  Abdominal hysterectomy A large surgical cut (incision) is made in the abdomen. The uterus is removed through this incision.  Vaginal hysterectomy An incision is made in the vagina. The uterus is removed through this incision. There are no abdominal incisions.  Conventional laparoscopic hysterectomy Three or four small incisions are made in the abdomen. A thin, lighted tube with a camera (laparoscope) is inserted into one of the incisions. Other tools are put through the other incisions. The uterus is cut into small pieces. The small pieces are removed through the incisions, or they are removed through the vagina.  Laparoscopically assisted vaginal hysterectomy (LAVH) Three or four small  incisions are made in the abdomen. Part of the surgery is performed laparoscopically and part vaginally. The uterus is removed through the vagina.  Robot-assisted laparoscopic hysterectomy A laparoscope and other tools are inserted into 3 or 4 small incisions in the abdomen. A computer-controlled device is used to give the surgeon a 3D image and to help control the surgical instruments. This allows for more precise movements of surgical instruments. The uterus is cut into small pieces and removed through the incisions or removed through the vagina. RISKS AND COMPLICATIONS  Possible complications associated with this procedure include:  Bleeding and risk of blood transfusion. Tell your health care provider if you do not want to receive any blood products.  Blood clots in the legs or lung.  Infection.  Injury to surrounding organs.  Problems or side effects related to anesthesia.  Conversion to an abdominal hysterectomy from one of the other techniques. WHAT TO EXPECT AFTER A HYSTERECTOMY  You will be given pain medicine.  You will need to have someone with you for the first 3 5 days after you go home.  You will need to follow up with your surgeon in 2 4 weeks after surgery to evaluate your progress.  You may have early menopause symptoms such as hot flashes, night sweats, and insomnia.  If you had a hysterectomy for a problem that was not cancer or not a condition that could lead to cancer, then you no longer need Pap tests. However, even if you no longer need a Pap test, a regular exam is a good idea to make sure no other problems are starting. Document Released: 09/27/2000 Document Revised: 01/22/2013 Document Reviewed: 12/09/2012 [employer] Patient Information 2014 [employer].  

## 2013-07-31 NOTE — Progress Notes (Signed)
Subjective:     Patient ID: Danielle Reilly, female   DOB: 09/10/1975, 38 y.o.   MRN: [id number]  HPI Pt here for 6 weeks post op check from a [facility].  She denies problems now.  She reports no pain or bleeding.      Review of Systems     Objective:   Physical Exam Temp(Src) 97.5 F (36.4 C) (Oral)  Ht 5\' 5"  (1.651 m)  Wt 172 lb 11.2 oz (78.336 kg)  BMI 28.74 kg/m2  LMP 08/15/2012  Pt in NAD Abd; soft, NT, ND GU: EGBUS: no lesions Vagina: no blood in vault; sutures noted in cuff.  Very small area of granulation tissue treated with silver nitrate. Adnexa: no masses; non tender      06/19/2013 Diagnosis Uterus and cervix, with right fallopian tube - ADENOMYOSIS. - CERVIX: BENIGN SQUAMOUS MUCOSA AND ENDOCERVICAL MUCOSA, NO DYSPLASIA OR MALIGNANCY. - ENDOMETRIUM: BENIGN INACTIVE ENDOMETRIUM WITH DECIDUALIZED STROMA, NO EVIDENCE OF ATYPIA, HYPERPLASIA OR MALIGNANCY. - RIGHT FALLOPIAN TUBE: BENIGN FALLOPIAN TUBAL TISSUE, NO ATYPIA, HYPERPLASIA OR MALIGNANCY    Assessment:     [facility]  Doing well      Plan:     Reviewed surg path F/u in 3 months or sooner prn Gradual return to full activities

## 2013-08-19 ENCOUNTER — Telehealth: Payer: Self-pay | Admitting: Internal Medicine

## 2013-08-19 NOTE — Telephone Encounter (Signed)
Pt has a rash on hand but does not know if she needs a Dr appt or if it is something she should wait out.  Would like to speak to nurse for advice/triage.

## 2013-08-20 ENCOUNTER — Telehealth: Payer: Self-pay

## 2013-08-20 NOTE — Telephone Encounter (Signed)
Patient has some type of fungus to her finger tips that she stated has gotten Worse since using a anti fungal cream  Instructed her to make an appointment to be seen in the office

## 2013-08-27 ENCOUNTER — Telehealth: Payer: Self-pay | Admitting: *Deleted

## 2013-08-27 NOTE — Telephone Encounter (Signed)
Patient left message that she was cleared from her restrictions from hysterectomy on 07/31/13. She is doing well but is experiencing some spotting when she has sex. Wanted to know if this was normal or does she need to come in?

## 2013-08-27 NOTE — Telephone Encounter (Signed)
Spoke with Dr. Erin FullingHarraway Smith about patient and she stated that patient may have some granulation tissue and to bring patient in to be checked. Called patient back and scheduled her to come in on May 14 at 1 pm.

## 2013-08-28 ENCOUNTER — Encounter: Payer: Self-pay | Admitting: Obstetrics & Gynecology

## 2013-08-28 ENCOUNTER — Ambulatory Visit (INDEPENDENT_AMBULATORY_CARE_PROVIDER_SITE_OTHER): Payer: Self-pay | Admitting: Obstetrics & Gynecology

## 2013-08-28 VITALS — BP 123/68 | HR 71 | Temp 99.4°F | Ht 64.0 in | Wt 174.1 lb

## 2013-08-28 DIAGNOSIS — B373 Candidiasis of vulva and vagina: Secondary | ICD-10-CM

## 2013-08-28 DIAGNOSIS — B3731 Acute candidiasis of vulva and vagina: Secondary | ICD-10-CM

## 2013-08-28 MED ORDER — FLUCONAZOLE 150 MG PO TABS: 150.0000 mg | ORAL_TABLET | Freq: Once | ORAL | Status: DC

## 2013-08-28 NOTE — Patient Instructions (Signed)
Monilial Vaginitis  Vaginitis in a soreness, swelling and redness (inflammation) of the vagina and vulva. Monilial vaginitis is not a sexually transmitted infection.  CAUSES   Yeast vaginitis is caused by yeast (candida) that is normally found in your vagina. With a yeast infection, the candida has overgrown in number to a point that upsets the chemical balance.  SYMPTOMS   · White, thick vaginal discharge.  · Swelling, itching, redness and irritation of the vagina and possibly the lips of the vagina (vulva).  · Burning or painful urination.  · Painful intercourse.  DIAGNOSIS   Things that may contribute to monilial vaginitis are:  · Postmenopausal and virginal states.  · Pregnancy.  · Infections.  · Being tired, sick or stressed, especially if you had monilial vaginitis in the past.  · Diabetes. Good control will help lower the chance.  · Birth control pills.  · Tight fitting garments.  · Using bubble bath, feminine sprays, douches or deodorant tampons.  · Taking certain medications that kill germs (antibiotics).  · Sporadic recurrence can occur if you become ill.  TREATMENT   Your caregiver will give you medication.  · There are several kinds of anti monilial vaginal creams and suppositories specific for monilial vaginitis. For recurrent yeast infections, use a suppository or cream in the vagina 2 times a week, or as directed.  · Anti-monilial or steroid cream for the itching or irritation of the vulva may also be used. Get your caregiver's permission.  · Painting the vagina with methylene blue solution may help if the monilial cream does not work.  · Eating yogurt may help prevent monilial vaginitis.  HOME CARE INSTRUCTIONS   · Finish all medication as prescribed.  · Do not have sex until treatment is completed or after your caregiver tells you it is okay.  · Take warm sitz baths.  · Do not douche.  · Do not use tampons, especially scented ones.  · Wear cotton underwear.  · Avoid tight pants and panty  hose.  · Tell your sexual partner that you have a yeast infection. They should go to their caregiver if they have symptoms such as mild rash or itching.  · Your sexual partner should be treated as well if your infection is difficult to eliminate.  · Practice safer sex. Use condoms.  · Some vaginal medications cause latex condoms to fail. Vaginal medications that harm condoms are:  · Cleocin cream.  · Butoconazole (Femstat®).  · Terconazole (Terazol®) vaginal suppository.  · Miconazole (Monistat®) (may be purchased over the counter).  SEEK MEDICAL CARE IF:   · You have a temperature by mouth above 102° F (38.9° C).  · The infection is getting worse after 2 days of treatment.  · The infection is not getting better after 3 days of treatment.  · You develop blisters in or around your vagina.  · You develop vaginal bleeding, and it is not your menstrual period.  · You have pain when you urinate.  · You develop intestinal problems.  · You have pain with sexual intercourse.  Document Released: 01/11/2005 Document Revised: 06/26/2011 Document Reviewed: 09/25/2008  Exit[employer]® Patient Information ©2014 [employer].

## 2013-08-28 NOTE — Progress Notes (Signed)
Spotting during sexual intercourse, bright red bleeding.

## 2013-08-28 NOTE — Progress Notes (Signed)
Subjective:     Patient ID: Danielle Reilly, female   DOB: 05/02/1975, 38 y.o.   MRN: [id number]  HPI Pt c/o post coital bleeding.  She reports thick white discharge.  [facility]'d with BV and yeast infection.  She was told not to take the meds for yeast until the Metrogel was completed.  She reports that the initial episode of spotting occurred PRIOR to the abnormal discharge.  She denies pain with the bleeding     Review of Systems     Objective:   Physical Exam BP 123/68  Pulse 71  Temp(Src) 99.4 F (37.4 C)  Ht 5\' 4"  (1.626 m)  Wt 174 lb 1.6 oz (78.971 kg)  BMI 29.87 kg/m2  LMP 06/19/2013 Pt in NAD  GU: EGBUS: no lesions Vagina: no blood in vault; cuff well healed. No granulation tissue noted. Thick white curdlike discharge in vault        Assessment:     Yeast vaginitis     Plan:     Diflucan 150mg  x1  Rec KY jelly for lubrication with intercourse

## 2013-08-30 ENCOUNTER — Encounter: Payer: Self-pay | Admitting: *Deleted

## 2014-02-16 ENCOUNTER — Encounter: Payer: Self-pay | Admitting: Obstetrics & Gynecology

## 2014-11-14 ENCOUNTER — Inpatient Hospital Stay (HOSPITAL_COMMUNITY)
Admission: AD | Admit: 2014-11-14 | Discharge: 2014-11-14 | Disposition: A | Discharge status: Home / Self Care | Payer: Self-pay | Source: Ambulatory Visit | Attending: Family Medicine | Admitting: Family Medicine

## 2014-11-14 ENCOUNTER — Encounter (HOSPITAL_COMMUNITY): Payer: Self-pay | Admitting: *Deleted

## 2014-11-14 DIAGNOSIS — K219 Gastro-esophageal reflux disease without esophagitis: Secondary | ICD-10-CM | POA: Insufficient documentation

## 2014-11-14 DIAGNOSIS — R197 Diarrhea, unspecified: Secondary | ICD-10-CM | POA: Insufficient documentation

## 2014-11-14 DIAGNOSIS — R1031 Right lower quadrant pain: Secondary | ICD-10-CM | POA: Insufficient documentation

## 2014-11-14 LAB — URINALYSIS, ROUTINE W REFLEX MICROSCOPIC
BILIRUBIN URINE: NEGATIVE
Glucose, UA: NEGATIVE mg/dL
KETONES UR: NEGATIVE mg/dL
Leukocytes, UA: NEGATIVE
NITRITE: NEGATIVE
PROTEIN: NEGATIVE mg/dL
Specific Gravity, Urine: 1.025 (ref 1.005–1.030)
UROBILINOGEN UA: 0.2 mg/dL (ref 0.0–1.0)
pH: 6 (ref 5.0–8.0)

## 2014-11-14 LAB — CBC
HCT: 35.3 % — ABNORMAL LOW (ref 36.0–46.0)
HEMOGLOBIN: 11.4 g/dL — AB (ref 12.0–15.0)
MCH: 28.1 pg (ref 26.0–34.0)
MCHC: 32.3 g/dL (ref 30.0–36.0)
MCV: 86.9 fL (ref 78.0–100.0)
Platelets: 273 10*3/uL (ref 150–400)
RBC: 4.06 MIL/uL (ref 3.87–5.11)
RDW: 13.7 % (ref 11.5–15.5)
WBC: 8.6 10*3/uL (ref 4.0–10.5)

## 2014-11-14 LAB — COMPREHENSIVE METABOLIC PANEL
ALBUMIN: 3.8 g/dL (ref 3.5–5.0)
ALT: 13 U/L — ABNORMAL LOW (ref 14–54)
AST: 18 U/L (ref 15–41)
Alkaline Phosphatase: 54 U/L (ref 38–126)
Anion gap: 2 — ABNORMAL LOW (ref 5–15)
BUN: 11 mg/dL (ref 6–20)
CALCIUM: 8.9 mg/dL (ref 8.9–10.3)
CO2: 26 mmol/L (ref 22–32)
Chloride: 108 mmol/L (ref 101–111)
Creatinine, Ser: 0.96 mg/dL (ref 0.44–1.00)
GFR calc Af Amer: >60 mL/min (ref >60)
GFR calc non Af Amer: >60 mL/min (ref >60)
Glucose, Bld: 102 mg/dL — ABNORMAL HIGH (ref 65–99)
Potassium: 4.1 mmol/L (ref 3.5–5.1)
SODIUM: 136 mmol/L (ref 135–145)
Total Bilirubin: 0.6 mg/dL (ref 0.3–1.2)
Total Protein: 6.6 g/dL (ref 6.5–8.1)

## 2014-11-14 LAB — URINE MICROSCOPIC-ADD ON

## 2014-11-14 NOTE — Discharge Instructions (Signed)

## 2014-11-14 NOTE — MAU Note (Signed)
Pt presents to MAU with complaints of diarrhea after eating for a week. Reports lower abdominal pain and back pain.

## 2014-11-14 NOTE — MAU Provider Note (Signed)
History     CSN: [id number]  Arrival date and time: 11/14/14 1744   First Provider Initiated Contact with Patient 11/14/14 1835      Chief Complaint  Patient presents with  . Diarrhea  . Abdominal Pain   HPI Comments: Danielle Reilly is a 39 y.o. [id number] who presents today with diarrhea x one week. She states that it happens after every time she eats. She also has pain in RLQ. She rates her pain as 7/10.   Diarrhea  This is a new problem. The current episode started in the past 7 days. The problem occurs 2 to 4 times per day. The problem has been unchanged. The stool consistency is described as watery. The patient states that diarrhea awakens her from sleep. Nothing (unable to find a spcific food that makes it worse. "It seems to be anything I eat". ) aggravates the symptoms. There are no known risk factors (No recent antibotic use. ). She has tried nothing for the symptoms.    Past Medical History  Diagnosis Date  . Anemia   . GERD (gastroesophageal reflux disease)     tums as needed  . SVD (spontaneous vaginal delivery)     x 1    Past Surgical History  Procedure Laterality Date  . Laparoscopy for ectopic pregnancy    . Laparoscopy  12/25/2010    Procedure: LAPAROSCOPY OPERATIVE;  Surgeon: Roseanna Rainbow, MD;  Location: [facility];  Service: Gynecology;  Laterality: N/A;  Operative laparoscopy /cauterization of right hemorrhagic cyst wall. Removal of belly rings.  . Uterine biopsy    . Dilitation & currettage/hystroscopy with novasure ablation N/A 04/04/2013    Procedure: DILATATION & CURETTAGE/HYSTEROSCOPY WITH Attempted NOVASURE ABLATION ;  Surgeon: Willodean Rosenthal, MD;  Location: [facility];  Service: Gynecology;  Laterality: N/A;  . Novasure ablation N/A 05/26/2013    Procedure: UNSUCCESSFUL NOVASURE ABLATION;  Surgeon: Willodean Rosenthal, MD;  Location: [facility];  Service: Gynecology;  Laterality: N/A;  . Hysteroscopy N/A 05/26/2013    Procedure: UNSUCCESSFUL HYSTEROSCOPY  WITH HYDROTHERMAL ABLATION;  Surgeon: Willodean Rosenthal, MD;  Location: [facility];  Service: Gynecology;  Laterality: N/A;  . Vaginal hysterectomy N/A 06/19/2013    Procedure:  TOTAL VAGINAL HYSTERECTOMY with bilateral salpingectomy;  Surgeon: Willodean Rosenthal, MD;  Location: [facility];  Service: Gynecology;  Laterality: N/A;  . Bilateral salpingectomy N/A 06/19/2013    Procedure: BILATERAL SALPINGECTOMY;  Surgeon: Willodean Rosenthal, MD;  Location: [facility];  Service: Gynecology;  Laterality: N/A;    Family History  Problem Relation Age of Onset  . Drug abuse Mother   . Arthritis Maternal Aunt   . Cancer Maternal Aunt   . Drug abuse Maternal Aunt   . Arthritis Maternal Uncle   . Alcohol abuse Maternal Uncle   . Cancer Maternal Uncle   . Drug abuse Maternal Uncle   . Cancer Maternal Grandmother   . Alcohol abuse Maternal Grandfather   . Cancer Maternal Grandfather     History  Substance Use Topics  . Smoking status: Never Smoker   . Smokeless tobacco: Never Used  . Alcohol Use: 1.8 oz/week    3 Glasses of wine per week     Comment: socially    Allergies: No Known Allergies  Prescriptions prior to admission  Medication Sig Dispense Refill Last Dose  . Aspirin-Salicylamide-Caffeine (BC HEADACHE PO) Take 1 packet by mouth daily as needed (for headache).   Past Week at Unknown time  . Multiple Vitamin (MULTIVITAMIN WITH MINERALS) TABS  tablet Take 1 tablet by mouth daily.   11/13/2014 at Unknown time  . docusate sodium (COLACE) 100 MG capsule Take 1 capsule (100 mg total) by mouth 2 (two) times daily as needed. (Patient not taking: Reported on 11/14/2014) 30 capsule 2 Not Taking  . docusate sodium 100 MG CAPS Take 100 mg by mouth 2 (two) times daily. (Patient not taking: Reported on 11/14/2014) 10 capsule 0 Not Taking  . fluconazole (DIFLUCAN) 150 MG tablet Take 1 tablet (150 mg total) by mouth once. (Patient not taking: Reported on 11/14/2014) 1 tablet 0   . ibuprofen  (ADVIL,MOTRIN) 600 MG tablet Take 1 tablet (600 mg total) by mouth every 6 (six) hours as needed (mild pain). (Patient not taking: Reported on 11/14/2014) 30 tablet 0 Not Taking  . oxyCODONE-acetaminophen (PERCOCET/ROXICET) 5-325 MG per tablet Take 1-2 tablets by mouth every 4 (four) hours as needed for severe pain (moderate to severe pain (when tolerating fluids)). (Patient not taking: Reported on 11/14/2014) 30 tablet 0 Not Taking    Review of Systems  Genitourinary: Negative for urgency.   Physical Exam   Blood pressure 117/68, pulse 80, temperature 98.4 F (36.9 C), resp. rate 18, height  (1.6 m), weight 82.101 kg (181 lb), last menstrual period 06/19/2013.  Physical Exam  Nursing note and vitals reviewed. Constitutional: She is oriented to person, place, and time. She appears well-developed and well-nourished. No distress.  HENT:  Head: Normocephalic.  Cardiovascular: Normal rate.   Respiratory: Effort normal.  GI: Soft. There is no tenderness. There is no rebound.  Neurological: She is alert and oriented to person, place, and time.  Skin: Skin is warm and dry.  Psychiatric: She has a normal mood and affect.   Results for orders placed or performed during the hospital encounter of 11/14/14 (from the past 24 hour(s))  Urinalysis, Routine w reflex microscopic (not at [facility])     Status: Abnormal   Collection Time: 11/14/14  5:55 PM  Result Value Ref Range   Color, Urine YELLOW YELLOW   APPearance CLEAR CLEAR   Specific Gravity, Urine 1.025 1.005 - 1.030   pH 6.0 5.0 - 8.0   Glucose, UA NEGATIVE NEGATIVE mg/dL   Hgb urine dipstick TRACE (A) NEGATIVE   Bilirubin Urine NEGATIVE NEGATIVE   Ketones, ur NEGATIVE NEGATIVE mg/dL   Protein, ur NEGATIVE NEGATIVE mg/dL   Urobilinogen, UA 0.2 0.0 - 1.0 mg/dL   Nitrite NEGATIVE NEGATIVE   Leukocytes, UA NEGATIVE NEGATIVE  Urine microscopic-add on     Status: Abnormal   Collection Time: 11/14/14  5:55 PM  Result Value Ref Range    Squamous Epithelial / LPF FEW (A) RARE   WBC, UA 0-2 <3 WBC/hpf   RBC / HPF 0-2 <3 RBC/hpf   Bacteria, UA FEW (A) RARE  CBC     Status: Abnormal   Collection Time: 11/14/14  6:45 PM  Result Value Ref Range   WBC 8.6 4.0 - 10.5 K/uL   RBC 4.06 3.87 - 5.11 MIL/uL   Hemoglobin 11.4 (L) 12.0 - 15.0 g/dL   HCT 16.1 (L) 09.6 - 04.5 %   MCV 86.9 78.0 - 100.0 fL   MCH 28.1 26.0 - 34.0 pg   MCHC 32.3 30.0 - 36.0 g/dL   RDW 40.9 81.1 - 91.4 %   Platelets 273 150 - 400 K/uL  Comprehensive metabolic panel     Status: Abnormal   Collection Time: 11/14/14  6:45 PM  Result Value Ref Range  Sodium 136 135 - 145 mmol/L   Potassium 4.1 3.5 - 5.1 mmol/L   Chloride 108 101 - 111 mmol/L   CO2 26 22 - 32 mmol/L   Glucose, Bld 102 (H) 65 - 99 mg/dL   BUN 11 6 - 20 mg/dL   Creatinine, Ser 1.61 0.44 - 1.00 mg/dL   Calcium 8.9 8.9 - 09.6 mg/dL   Total Protein 6.6 6.5 - 8.1 g/dL   Albumin 3.8 3.5 - 5.0 g/dL   AST 18 15 - 41 U/L   ALT 13 (L) 14 - 54 U/L   Alkaline Phosphatase 54 38 - 126 U/L   Total Bilirubin 0.6 0.3 - 1.2 mg/dL   GFR calc non Af Amer >60 >60 mL/min   GFR calc Af Amer >60 >60 mL/min   Anion gap 2 (L) 5 - 15     MAU Course  Procedures  MDM   Assessment and Plan   1. Diarrhea    DC home Comfort measures reviewed  BRAT diet  RX: none  Return to MAU as needed   Follow-up Information    Follow up with [facility].   Specialty:  Emergency Medicine   Why:  If symptoms worsen   Contact information:   [address] [place] [id number] mc [place] [address] [phone]        Tawnya Crook 11/14/2014, 7:35 PM

## 2015-03-24 IMAGING — US US SONOHYSTEROGRAM - WITH RAD
1 series · 13 of 25 positions shown · non-contrast
Comparison: Ultrasound pelvis 10/25/2012

CLINICAL DATA: DUB with prior ultrasound suggesting abnormal
endometrial thickening.

US SONOHYSTEROGRAM
TECHNIQUE: Following cleansing of the cervix and vagina with
Betadine, a hysterosalpingogram catheter was placed within the
endocervical canal.  Sonohysterogram was then performed with
transvaginal sonography during infusion of sterile saline solution
into the endometrial cavity.

[Series 1: us sonohysterogram · 38 acquisitions, 13 frames shown]
[im 1/38]
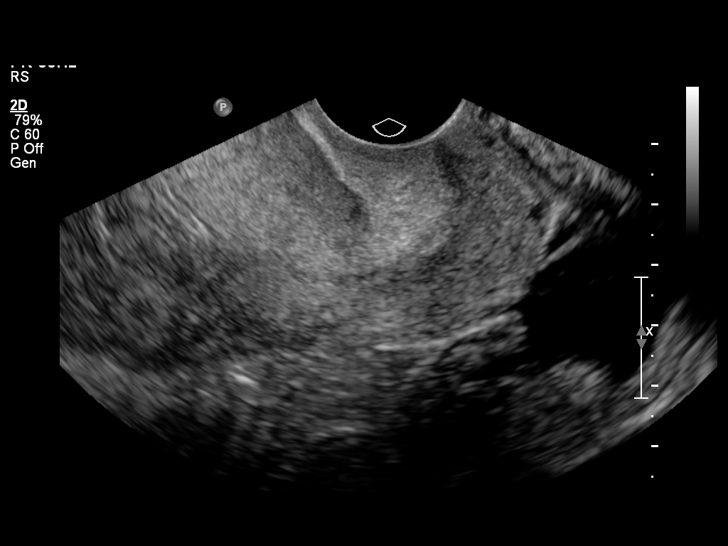
[im 4/38]
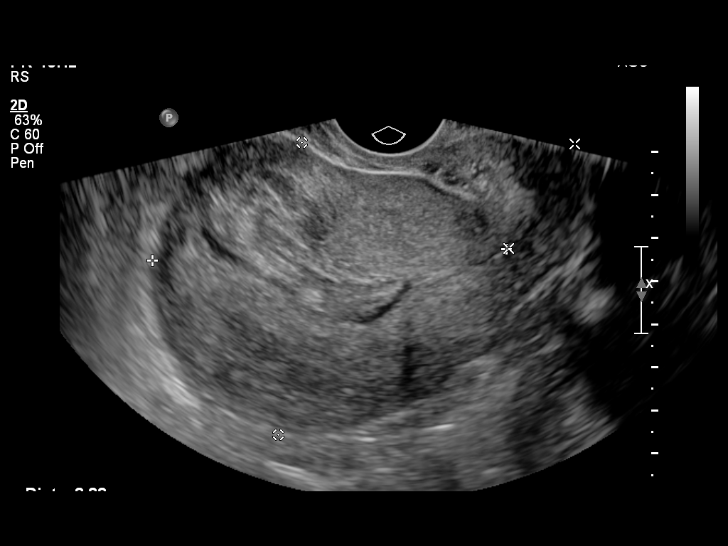
[im 7/38]
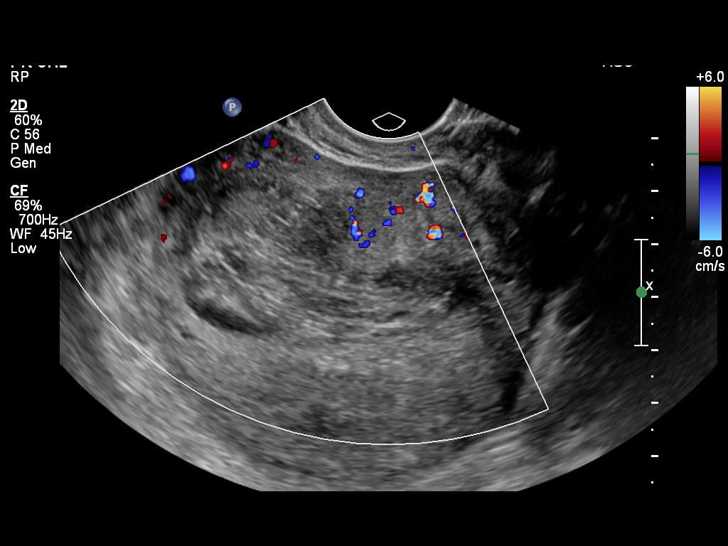
[im 10/38]
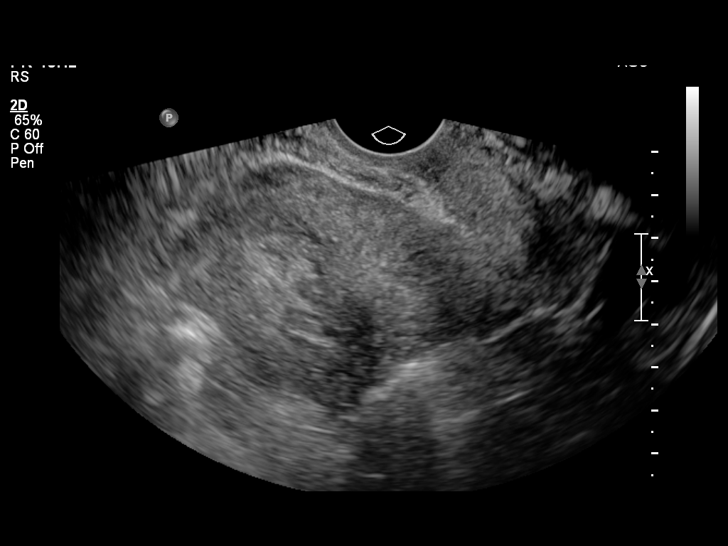
[im 13/38]
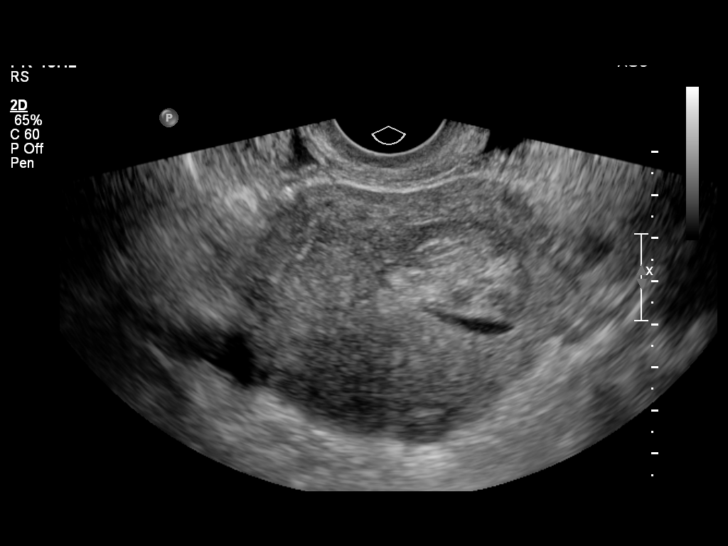
[im 16/38]
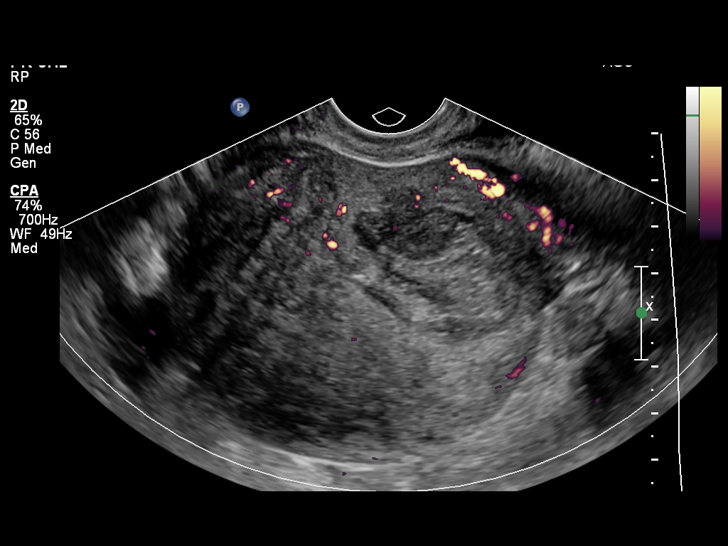
[im 19/38]
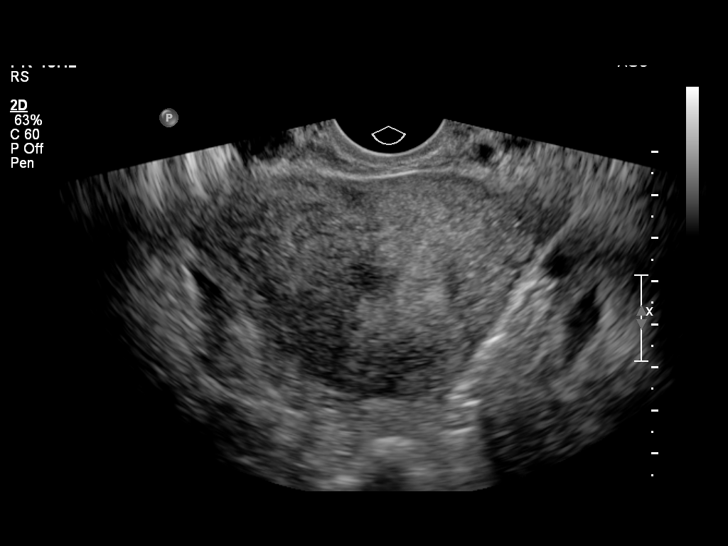
[im 22/38]
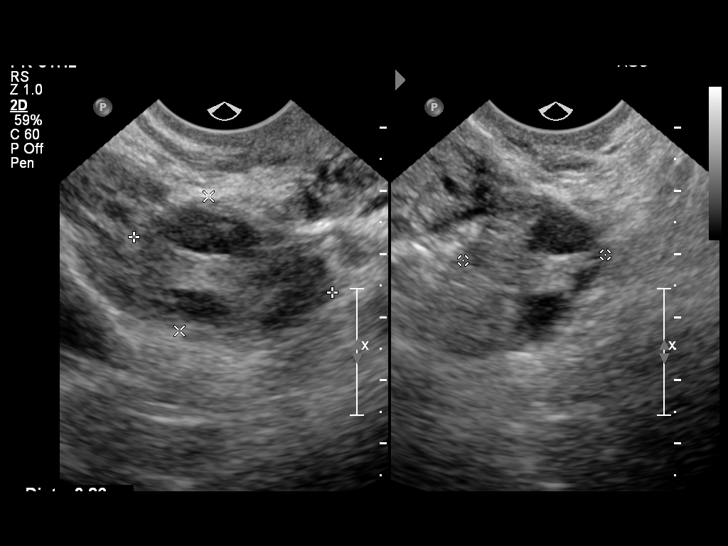
[im 25/38]
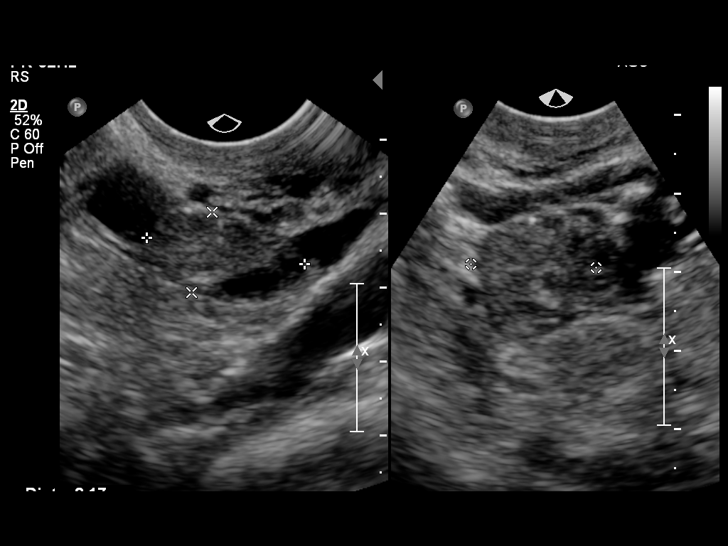
[im 28/38]
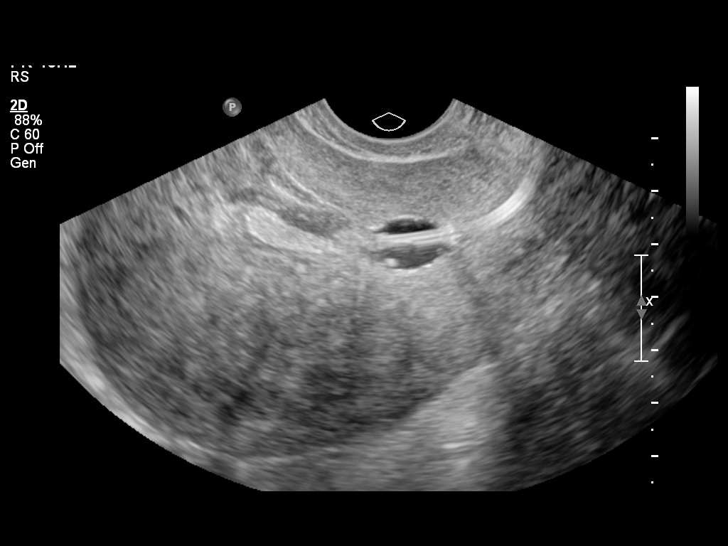
[im 31/38]
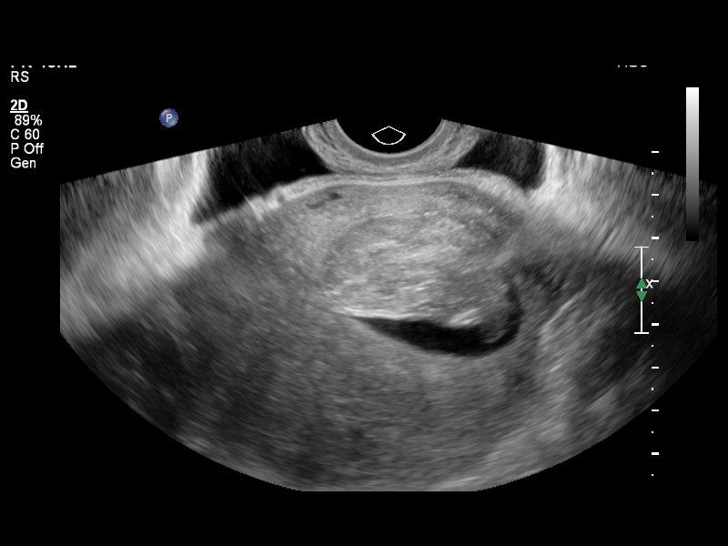
[im 34/38]
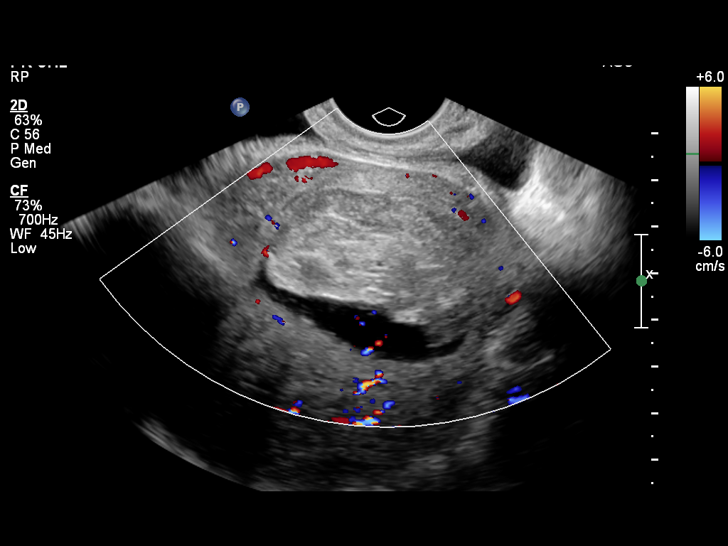
[im 38/38]
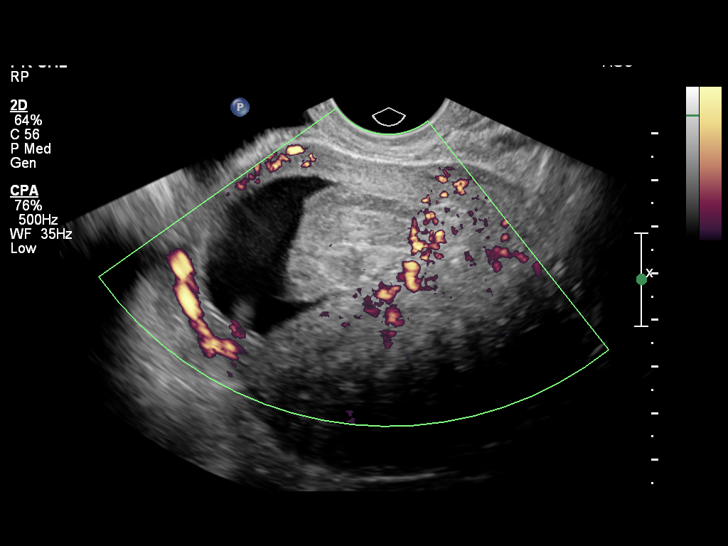

[13 of 25 positions shown; findings below may reference images not displayed]

FINDINGS: The uterus measures 8.2 x 2.9 x 6.8 cm.  After infusion
of saline there is a 4.8 x 2.5 x 4.2 cm solid appearing isoechoic
mass within the endometrium which appears to be arising from the
anterior wall as fluid is demonstrated along the posterior and
fundal margins of this mass.  No definite internal color blood flow
is able to be visualized.  The myometrium is grossly unremarkable
in appearance.  The ovaries are normal bilaterally.
IMPRESSION: A large endometrial mass which is non-dependent and appears
attached to the anterior uterine wall.  This is suggestive of a
large polyp or less likely a fibroid.  Given the age, malignancy is
considered less likely however not entirely excluded.  Further
evaluation with GYN consultation and possible surgical evaluation
is recommended.

These results will be called to the ordering clinician or
representative by the Radiologist Assistant, and communication
documented in the PACS Dashboard.

## 2015-04-13 ENCOUNTER — Inpatient Hospital Stay (HOSPITAL_COMMUNITY)
Admission: AD | Admit: 2015-04-13 | Discharge: 2015-04-13 | Disposition: A | Discharge status: Home / Self Care | Payer: Self-pay | Source: Ambulatory Visit | Attending: Family Medicine | Admitting: Family Medicine

## 2015-04-13 ENCOUNTER — Encounter (HOSPITAL_COMMUNITY): Payer: Self-pay

## 2015-04-13 DIAGNOSIS — K5900 Constipation, unspecified: Secondary | ICD-10-CM | POA: Insufficient documentation

## 2015-04-13 DIAGNOSIS — R102 Pelvic and perineal pain: Secondary | ICD-10-CM | POA: Insufficient documentation

## 2015-04-13 DIAGNOSIS — Z7982 Long term (current) use of aspirin: Secondary | ICD-10-CM | POA: Insufficient documentation

## 2015-04-13 DIAGNOSIS — N760 Acute vaginitis: Secondary | ICD-10-CM | POA: Insufficient documentation

## 2015-04-13 DIAGNOSIS — A499 Bacterial infection, unspecified: Secondary | ICD-10-CM

## 2015-04-13 DIAGNOSIS — Z202 Contact with and (suspected) exposure to infections with a predominantly sexual mode of transmission: Secondary | ICD-10-CM | POA: Insufficient documentation

## 2015-04-13 DIAGNOSIS — B9689 Other specified bacterial agents as the cause of diseases classified elsewhere: Secondary | ICD-10-CM

## 2015-04-13 LAB — URINALYSIS, ROUTINE W REFLEX MICROSCOPIC
Bilirubin Urine: NEGATIVE
Glucose, UA: NEGATIVE mg/dL
Ketones, ur: 15 mg/dL — AB
Leukocytes, UA: NEGATIVE
NITRITE: NEGATIVE
Protein, ur: NEGATIVE mg/dL
pH: 6 (ref 5.0–8.0)

## 2015-04-13 LAB — WET PREP, GENITAL
SPERM: NONE SEEN
Trich, Wet Prep: NONE SEEN
Yeast Wet Prep HPF POC: NONE SEEN

## 2015-04-13 LAB — POCT PREGNANCY, URINE: PREG TEST UR: NEGATIVE

## 2015-04-13 LAB — URINE MICROSCOPIC-ADD ON

## 2015-04-13 MED ORDER — FLUCONAZOLE 200 MG PO TABS: 200.0000 mg | ORAL_TABLET | Freq: Once | ORAL | Status: AC

## 2015-04-13 MED ORDER — METRONIDAZOLE 500 MG PO TABS: 500.0000 mg | ORAL_TABLET | Freq: Two times a day (BID) | ORAL | Status: DC

## 2015-04-13 NOTE — Discharge Instructions (Signed)

## 2015-04-13 NOTE — MAU Note (Signed)
Pt presents with complaint that her boyfriend had had unprotected sex with someone else and for the last week she has had pain on her right side and a vaginal discharge. Wants to be tested for STD's

## 2015-04-13 NOTE — MAU Provider Note (Signed)
History     CSN: [id number]  Arrival date and time: 04/13/15 1843   None     No chief complaint on file.  HPI  Ms Danielle Reilly is a 39yo [id number] who presents for eval of intermittent mild lower right pelvic discomfort x 1week. She recently discovered that her boyfriend had been "cheating on me" and is concerned re STDs. Denies dysuria, fever. Reports mild constipation for which she took a laxative with minimal results. Her hx is remarkable for a [facility]/BSO in 3/15.  OB History    Gravida Para Term Preterm AB TAB SAB Ectopic Multiple Living   0  1      Past Medical History  Diagnosis Date  . Anemia   . GERD (gastroesophageal reflux disease)     tums as needed  . SVD (spontaneous vaginal delivery)     x 1    Past Surgical History  Procedure Laterality Date  . Laparoscopy for ectopic pregnancy    . Laparoscopy  12/25/2010    Procedure: LAPAROSCOPY OPERATIVE;  Surgeon: Roseanna Rainbow, MD;  Location: [facility];  Service: Gynecology;  Laterality: N/A;  Operative laparoscopy /cauterization of right hemorrhagic cyst wall. Removal of belly rings.  . Uterine biopsy    . Dilitation & currettage/hystroscopy with novasure ablation N/A 04/04/2013    Procedure: DILATATION & CURETTAGE/HYSTEROSCOPY WITH Attempted NOVASURE ABLATION ;  Surgeon: Willodean Rosenthal, MD;  Location: [facility];  Service: Gynecology;  Laterality: N/A;  . Novasure ablation N/A 05/26/2013    Procedure: UNSUCCESSFUL NOVASURE ABLATION;  Surgeon: Willodean Rosenthal, MD;  Location: [facility];  Service: Gynecology;  Laterality: N/A;  . Hysteroscopy N/A 05/26/2013    Procedure: UNSUCCESSFUL HYSTEROSCOPY WITH HYDROTHERMAL ABLATION;  Surgeon: Willodean Rosenthal, MD;  Location: [facility];  Service: Gynecology;  Laterality: N/A;  . Vaginal hysterectomy N/A 06/19/2013    Procedure:  TOTAL VAGINAL HYSTERECTOMY with bilateral salpingectomy;  Surgeon: Willodean Rosenthal, MD;  Location: [facility];  Service: Gynecology;   Laterality: N/A;  . Bilateral salpingectomy N/A 06/19/2013    Procedure: BILATERAL SALPINGECTOMY;  Surgeon: Willodean Rosenthal, MD;  Location: [facility];  Service: Gynecology;  Laterality: N/A;    Family History  Problem Relation Age of Onset  . Drug abuse Mother   . Arthritis Maternal Aunt   . Cancer Maternal Aunt   . Drug abuse Maternal Aunt   . Arthritis Maternal Uncle   . Alcohol abuse Maternal Uncle   . Cancer Maternal Uncle   . Drug abuse Maternal Uncle   . Cancer Maternal Grandmother   . Alcohol abuse Maternal Grandfather   . Cancer Maternal Grandfather     Social History  Substance Use Topics  . Smoking status: Never Smoker   . Smokeless tobacco: Never Used  . Alcohol Use: 1.8 oz/week    3 Glasses of wine per week     Comment: socially    Allergies: No Known Allergies  Prescriptions prior to admission  Medication Sig Dispense Refill Last Dose  . Aspirin-Salicylamide-Caffeine (BC HEADACHE PO) Take 1 packet by mouth daily as needed (for headache).   Past Week at Unknown time  . Multiple Vitamin (MULTIVITAMIN WITH MINERALS) TABS tablet Take 1 tablet by mouth daily.   11/13/2014 at Unknown time    ROS Physical Exam   Blood pressure 121/55, pulse 86, temperature 97.7 F (36.5 C), temperature source Oral, resp. rate 18, last menstrual period 06/19/2013, SpO2 100 %.  Physical Exam  Constitutional: She is oriented  to person, place, and time. She appears well-developed.  HENT:  Head: Normocephalic.  Neck: Normal range of motion.  Cardiovascular: Normal rate.   Respiratory: Effort normal.  GI: Soft.  Genitourinary:  SE: sm white vag d/c at cuff, uterus/ovaries surgically absent; no pain generated by exam  Musculoskeletal: Normal range of motion.  Neurological: She is alert and oriented to person, place, and time.  Skin: Skin is warm and dry.  Psychiatric: She has a normal mood and affect. Her behavior is normal. Thought content normal.   Urinalysis    Component  Value Date/Time   COLORURINE YELLOW 04/13/2015 1926   APPEARANCEUR CLEAR 04/13/2015 1926   LABSPEC <1.005* 04/13/2015 1926   PHURINE 6.0 04/13/2015 1926   GLUCOSEU NEGATIVE 04/13/2015 1926   HGBUR TRACE* 04/13/2015 1926   BILIRUBINUR NEGATIVE 04/13/2015 1926   KETONESUR 15* 04/13/2015 1926   PROTEINUR NEGATIVE 04/13/2015 1926   UROBILINOGEN 0.2 11/14/2014 1755   NITRITE NEGATIVE 04/13/2015 1926   LEUKOCYTESUR NEGATIVE 04/13/2015 1926   Micro: SE 0-5, few bact  Wet prep: clue cells present, WBC mod, many bacteria   MAU Course  Procedures  MDM UA, UPT, wet prep, GC/chlam, HIV  Assessment and Plan  Pelvic pain BV Concern re STD exposure   D/C home GC/chlam/HIV pending Rx Flagyl 500 BID x 7d and Diflucan sent to [facility]  Brysen Shankman CNM 04/13/2015, 9:11 PM

## 2015-04-14 LAB — HIV ANTIBODY (ROUTINE TESTING W REFLEX): HIV Screen 4th Generation wRfx: NONREACTIVE

## 2015-04-17 LAB — GC/CHLAMYDIA PROBE AMP (~~LOC~~) NOT AT ARMC
CHLAMYDIA, DNA PROBE: NEGATIVE
Neisseria Gonorrhea: NEGATIVE

## 2015-10-21 ENCOUNTER — Encounter (HOSPITAL_COMMUNITY): Payer: Self-pay | Admitting: Emergency Medicine

## 2015-10-21 ENCOUNTER — Ambulatory Visit (INDEPENDENT_AMBULATORY_CARE_PROVIDER_SITE_OTHER): Payer: Self-pay

## 2015-10-21 ENCOUNTER — Ambulatory Visit (HOSPITAL_COMMUNITY)
Admission: EM | Admit: 2015-10-21 | Discharge: 2015-10-21 | Disposition: A | Discharge status: Home / Self Care | Payer: Self-pay | Attending: Emergency Medicine | Admitting: Emergency Medicine

## 2015-10-21 DIAGNOSIS — R079 Chest pain, unspecified: Secondary | ICD-10-CM

## 2015-10-21 DIAGNOSIS — R0789 Other chest pain: Secondary | ICD-10-CM

## 2015-10-21 DIAGNOSIS — F418 Other specified anxiety disorders: Secondary | ICD-10-CM

## 2015-10-21 NOTE — Discharge Instructions (Signed)
Chest Wall Pain Chest wall pain is pain in or around the bones and muscles of your chest. Sometimes, an injury causes this pain. Sometimes, the cause may not be known. This pain may take several weeks or longer to get better. HOME CARE Pay attention to any changes in your symptoms. Take these actions to help with your pain:  Rest as told by your doctor.  Avoid activities that cause pain. Try not to use your chest, belly (abdominal), or side muscles to lift heavy things.  If directed, apply ice to the painful area:  Put ice in a plastic bag.  Place a towel between your skin and the bag.  Leave the ice on for 20 minutes, 2-3 times per day.  Take over-the-counter and prescription medicines only as told by your doctor.  Do not use tobacco products, including cigarettes, chewing tobacco, and e-cigarettes. If you need help quitting, ask your doctor.  Keep all follow-up visits as told by your doctor. This is important. GET HELP IF:  You have a fever.  Your chest pain gets worse.  You have new symptoms. GET HELP RIGHT AWAY IF:  You feel sick to your stomach (nauseous) or you throw up (vomit).  You feel sweaty or light-headed.  You have a cough with phlegm (sputum) or you cough up blood.  You are short of breath.   This information is not intended to replace advice given to you by your health care provider. Make sure you discuss any questions you have with your health care provider.   Document Released: 09/20/2007 Document Revised: 12/23/2014 Document Reviewed: 06/29/2014 Elsevier Interactive Patient Education 2016 Elsevier Inc. [employer]Heat Therapy Heat therapy can help ease sore, stiff, injured, and tight muscles and joints. Heat relaxes your muscles, which may help ease your pain.  RISKS AND COMPLICATIONS If you have any of the following conditions, do not use heat therapy unless your health care provider has approved:  Poor circulation.  Healing wounds or scarred skin in the  area being treated.  Diabetes, heart disease, or high blood pressure.  Not being able to feel (numbness) the area being treated.  Unusual swelling of the area being treated.  Active infections.  Blood clots.  Cancer.  Inability to communicate pain. This may include young children and people who have problems with their brain function (dementia).  Pregnancy. Heat therapy should only be used on old, pre-existing, or long-lasting (chronic) injuries. Do not use heat therapy on new injuries unless directed by your health care provider. HOW TO USE HEAT THERAPY There are several different kinds of heat therapy, including:  Moist heat pack.  Warm water bath.  Hot water bottle.  Electric heating pad.  Heated gel pack.  Heated wrap.  Electric heating pad. Use the heat therapy method suggested by your health care provider. Follow your health care provider's instructions on when and how to use heat therapy. GENERAL HEAT THERAPY RECOMMENDATIONS  Do not sleep while using heat therapy. Only use heat therapy while you are awake.  Your skin may turn pink while using heat therapy. Do not use heat therapy if your skin turns red.  Do not use heat therapy if you have new pain.  High heat or long exposure to heat can cause burns. Be careful when using heat therapy to avoid burning your skin.  Do not use heat therapy on areas of your skin that are already irritated, such as with a rash or sunburn. SEEK MEDICAL CARE IF:  You have blisters,  redness, swelling, or numbness.  You have new pain.  Your pain is worse. MAKE SURE YOU:  Understand these instructions.  Will watch your condition.  Will get help right away if you are not doing well or get worse.   This information is not intended to replace advice given to you by your health care provider. Make sure you discuss any questions you have with your health care provider.   Document Released: 06/26/2011 Document Revised:  04/24/2014 Document Reviewed: 05/27/2013 Elsevier Interactive Patient Education [employer] Elsevier Inc.

## 2015-10-21 NOTE — ED Notes (Signed)
Chest pain for one week.  Pain is in upper chest, right and left upper chest.  Pain is intermittent, associated with emotional distress.  Patient reports recent stressors with her job.  Denies n/v.  Denies sob.  Reports some lightheadedness, only a few times.  Pain is described as sharp.  Pain does worsen with movement.

## 2015-10-21 NOTE — ED Provider Notes (Signed)
CSN: [id number]     Arrival date & time 10/21/15  1440 History   First MD Initiated Contact with Patient 10/21/15 1652     Chief Complaint  Patient presents with  . Chest Pain   (Consider location/radiation/quality/duration/timing/severity/associated sxs/prior Treatment) HPI History obtained from patient: Location:  Right upper chest Context/Duration:  1 week sudden onset while having a discussion with her boss. Severity: 3  Quality: Sharp, stabbing Timing:  Intermittent lasting for 30-40 seconds at a time.          Home Treatment: None Associated symptoms:  None  Family History: Hypertension    Past Medical History  Diagnosis Date  . Anemia   . GERD (gastroesophageal reflux disease)     tums as needed  . SVD (spontaneous vaginal delivery)     x 1   Past Surgical History  Procedure Laterality Date  . Laparoscopy for ectopic pregnancy    . Laparoscopy  12/25/2010    Procedure: LAPAROSCOPY OPERATIVE;  Surgeon: Roseanna RainbowLisa A Jackson-Moore, MD;  Location: [facility];  Service: Gynecology;  Laterality: N/A;  Operative laparoscopy /cauterization of right hemorrhagic cyst wall. Removal of belly rings.  . Uterine biopsy    . Dilitation & currettage/hystroscopy with novasure ablation N/A 04/04/2013    Procedure: DILATATION & CURETTAGE/HYSTEROSCOPY WITH Attempted NOVASURE ABLATION ;  Surgeon: Willodean Rosenthalarolyn Harraway-Smith, MD;  Location: [facility];  Service: Gynecology;  Laterality: N/A;  . Novasure ablation N/A 05/26/2013    Procedure: UNSUCCESSFUL NOVASURE ABLATION;  Surgeon: Willodean Rosenthalarolyn Harraway-Smith, MD;  Location: [facility];  Service: Gynecology;  Laterality: N/A;  . Hysteroscopy N/A 05/26/2013    Procedure: UNSUCCESSFUL HYSTEROSCOPY WITH HYDROTHERMAL ABLATION;  Surgeon: Willodean Rosenthalarolyn Harraway-Smith, MD;  Location: [facility];  Service: Gynecology;  Laterality: N/A;  . Vaginal hysterectomy N/A 06/19/2013    Procedure:  TOTAL VAGINAL HYSTERECTOMY with bilateral salpingectomy;  Surgeon: Willodean Rosenthalarolyn Harraway-Smith, MD;  Location: [facility];  Service: Gynecology;  Laterality: N/A;  . Bilateral salpingectomy N/A 06/19/2013    Procedure: BILATERAL SALPINGECTOMY;  Surgeon: Willodean Rosenthalarolyn Harraway-Smith, MD;  Location: [facility];  Service: Gynecology;  Laterality: N/A;   Family History  Problem Relation Age of Onset  . Drug abuse Mother   . Arthritis Maternal Aunt   . Cancer Maternal Aunt   . Drug abuse Maternal Aunt   . Arthritis Maternal Uncle   . Alcohol abuse Maternal Uncle   . Cancer Maternal Uncle   . Drug abuse Maternal Uncle   . Cancer Maternal Grandmother   . Alcohol abuse Maternal Grandfather   . Cancer Maternal Grandfather    Social History  Substance Use Topics  . Smoking status: Never Smoker   . Smokeless tobacco: Never Used  . Alcohol Use: 1.8 oz/week    3 Glasses of wine per week     Comment: socially   OB History    Gravida Para Term Preterm AB TAB SAB Ectopic Multiple Living   4 1 1  3 2 1  0  1     Review of Systems  Denies: HEADACHE, NAUSEA, ABDOMINAL PAIN,  CONGESTION, DYSURIA, SHORTNESS OF BREATH  Allergies  Review of patient's allergies indicates no known allergies.  Home Medications   Prior to Admission medications   Medication Sig Start Date End Date Taking? Authorizing Provider  Aspirin-Salicylamide-Caffeine (BC HEADACHE PO) Take 1 packet by mouth daily as needed (for headache).    Historical Provider, MD  metroNIDAZOLE (FLAGYL) 500 MG tablet Take 1 tablet (500 mg total) by mouth 2 (two) times daily. 04/13/15  Arabella MerlesKimberly D Shaw, CNM  Multiple Vitamin (MULTIVITAMIN WITH MINERALS) TABS tablet Take 1 tablet by mouth daily.    Historical Provider, MD   Meds Ordered and Administered this Visit  Medications - No data to display  BP 105/55 mmHg  Temp(Src) 98.4 F (36.9 C) (Oral)  Resp 16  SpO2 98%  LMP 06/19/2013 No data found.   Physical Exam NURSES NOTES AND VITAL SIGNS REVIEWED. CONSTITUTIONAL: Well developed, well nourished, no acute distress HEENT: normocephalic, atraumatic EYES:  Conjunctiva normal NECK:normal ROM, supple, no adenopathy PULMONARY:No respiratory distress, normal effort, minimal right upper chest wall tenderness.Marland Kitchen. Approximately rib #5 ABDOMINAL: Soft, ND, NT BS+, No CVAT MUSCULOSKELETAL: Normal ROM of all extremities,  SKIN: warm and dry without rash PSYCHIATRIC: Mood and affect, behavior are normal  ED Course  Procedures (including critical care time)  Labs Review Labs Reviewed - No data to display  Imaging Review Dg Chest 2 View  10/21/2015  CLINICAL DATA:  Chest pain beginning last week, patient indicates mid LEFT upper quadrant RIGHT upper quadrant, history GERD EXAM: CHEST  2 VIEW COMPARISON:  10/09/2008 FINDINGS: Normal heart size, mediastinal contours, and pulmonary vascularity. Central peribronchial thickening. No infiltrate, pleural effusion or pneumothorax. Bones unremarkable. BILATERAL nipple piercings. IMPRESSION: Bronchitic changes without infiltrate. Electronically Signed   By: Ulyses SouthwardMark  Boles M.D.   On: 10/21/2015 17:41   Reviewed CXRAnd this information is uses part of the MDM.  Visual Acuity Review  Right Eye Distance:   Left Eye Distance:   Bilateral Distance:    Right Eye Near:   Left Eye Near:    Bilateral Near:      I HAVE PERSONALLY REVIEWED ECG AND DISCUSSED FINDINGS WITH PATIENT.   PA UC INTERPRETATION: NORMAL SINUS RHYTHM WITH RATE OF 88, NO AXIS, OR INTERVAL ABNORMALITIES. NO ACUTE CHANGES. Single PVC noted.    ECG UNCHANGED AS COMPARED TO:12/2010    MDM  Pt is a 40 y/o Female with a history of chest pain that is sharp in nature and lasts for approximately 30-40 seconds and then is gone for the past week. She states that symptoms are worse during discussion with her boss at work Investigations today includes EKG chest x-ray vital signs 1. Chest pain, unspecified chest pain type   2. Chest wall pain   3. Situational anxiety     Patient is reassured that there are no issues that require transfer to higher level  of care at this time or additional tests. Patient is advised to continue home symptomatic treatment. Patient is advised that if there are new or worsening symptoms to attend the emergency department, contact primary care provider, or return to UC. Instructions of care provided discharged home in stable condition.    THIS NOTE WAS GENERATED USING A VOICE RECOGNITION SOFTWARE PROGRAM. ALL REASONABLE EFFORTS  WERE MADE TO PROOFREAD THIS DOCUMENT FOR ACCURACY.  I have verbally reviewed the discharge instructions with the patient. A printed AVS was given to the patient.  All questions were answered prior to discharge.      Tharon AquasFrank C Makiah Foye, PA 10/21/15 1816

## 2017-05-23 ENCOUNTER — Other Ambulatory Visit: Payer: Self-pay

## 2017-05-23 ENCOUNTER — Encounter (HOSPITAL_COMMUNITY): Payer: Self-pay | Admitting: Emergency Medicine

## 2017-05-23 DIAGNOSIS — Z79899 Other long term (current) drug therapy: Secondary | ICD-10-CM | POA: Diagnosis not present

## 2017-05-23 DIAGNOSIS — R1013 Epigastric pain: Secondary | ICD-10-CM | POA: Diagnosis not present

## 2017-05-23 LAB — CBC
HEMATOCRIT: 38.9 % (ref 36.0–46.0)
HEMOGLOBIN: 12.6 g/dL (ref 12.0–15.0)
MCH: 28.9 pg (ref 26.0–34.0)
MCHC: 32.4 g/dL (ref 30.0–36.0)
MCV: 89.2 fL (ref 78.0–100.0)
Platelets: 265 10*3/uL (ref 150–400)
RBC: 4.36 MIL/uL (ref 3.87–5.11)
RDW: 13.8 % (ref 11.5–15.5)
WBC: 7.1 10*3/uL (ref 4.0–10.5)

## 2017-05-23 LAB — URINALYSIS, ROUTINE W REFLEX MICROSCOPIC
Bilirubin Urine: NEGATIVE
Glucose, UA: NEGATIVE mg/dL
Ketones, ur: NEGATIVE mg/dL
Leukocytes, UA: NEGATIVE
Nitrite: NEGATIVE
PROTEIN: NEGATIVE mg/dL
SPECIFIC GRAVITY, URINE: 1.021 (ref 1.005–1.030)
pH: 6 (ref 5.0–8.0)

## 2017-05-23 LAB — COMPREHENSIVE METABOLIC PANEL
ALBUMIN: 4.5 g/dL (ref 3.5–5.0)
ALT: 25 U/L (ref 14–54)
AST: 38 U/L (ref 15–41)
Alkaline Phosphatase: 48 U/L (ref 38–126)
Anion gap: 12 (ref 5–15)
BILIRUBIN TOTAL: 1.2 mg/dL (ref 0.3–1.2)
BUN: 11 mg/dL (ref 6–20)
CHLORIDE: 100 mmol/L — AB (ref 101–111)
CO2: 24 mmol/L (ref 22–32)
Calcium: 9.8 mg/dL (ref 8.9–10.3)
Creatinine, Ser: 0.85 mg/dL (ref 0.44–1.00)
GFR calc Af Amer: >60 mL/min (ref >60)
GFR calc non Af Amer: >60 mL/min (ref >60)
GLUCOSE: 91 mg/dL (ref 65–99)
POTASSIUM: 3.2 mmol/L — AB (ref 3.5–5.1)
SODIUM: 136 mmol/L (ref 135–145)
Total Protein: 8 g/dL (ref 6.5–8.1)

## 2017-05-23 LAB — I-STAT BETA HCG BLOOD, ED (MC, WL, AP ONLY)

## 2017-05-23 LAB — LIPASE, BLOOD: LIPASE: 39 U/L (ref 11–51)

## 2017-05-23 NOTE — ED Triage Notes (Signed)
Pt c/o RLQ pain and lower back pain x 1 week. Denies n/v/diarrhea. States she has been stressed out at work and drinking more ETOH than normal. Denies urinary symptoms.

## 2017-05-24 ENCOUNTER — Emergency Department (HOSPITAL_COMMUNITY)
Admission: EM | Admit: 2017-05-24 | Discharge: 2017-05-24 | Disposition: A | Discharge status: Home / Self Care | Payer: BLUE CROSS/BLUE SHIELD | Attending: Emergency Medicine | Admitting: Emergency Medicine

## 2017-05-24 DIAGNOSIS — R1013 Epigastric pain: Secondary | ICD-10-CM

## 2017-05-24 MED ORDER — SODIUM CHLORIDE 0.9 % IV BOLUS (SEPSIS)
1000.0000 mL | Freq: Once | INTRAVENOUS | Status: AC
  Administered 2017-05-24: 1000 mL via INTRAVENOUS

## 2017-05-24 MED ORDER — GI COCKTAIL ~~LOC~~
30.0000 mL | Freq: Once | ORAL | Status: AC
  Administered 2017-05-24: 30 mL via ORAL
  Filled 2017-05-24: qty 30

## 2017-05-24 MED ORDER — OMEPRAZOLE 20 MG PO CPDR: 20.0000 mg | DELAYED_RELEASE_CAPSULE | Freq: Every day | ORAL | 0 refills | Status: DC

## 2017-05-24 NOTE — ED Provider Notes (Signed)
[facility] Provider Note   CSN: [id number] Arrival date & time: 05/23/17  1705   History   Chief Complaint Chief Complaint  Patient presents with  . Abdominal Pain    HPI Danielle Reilly is a 42 y.o. female who presents with abdominal pain.  She states that for the past week she has had gradually worsening epigastric abdominal pain after she eats.  Feels like a burning.  At first the pain was intermittent however now is every time after she eats.  She has been drinking a significant amount of alcohol due to stress. She states the people at her job are "petty". She denies any fever, chills, chest pain, shortness of breath, cough, nausea, vomiting, urinary symptoms.  She has not had this problem before.  Reports associated chest pain and low back pain.  She is status post hysterectomy.  She states she is not having significant pain at the moment because she is has not eaten anything in a while.  She has been had decreased p.o. intake so far has been feels constipated and dehydrated.  HPI  Past Medical History:  Diagnosis Date  . Anemia   . GERD (gastroesophageal reflux disease)    tums as needed  . SVD (spontaneous vaginal delivery)    x 1    Patient Active Problem List   Diagnosis Date Noted  . Postoperative state 06/19/2013  . Gingivitis, acute 01/29/2013  . Toothache 01/29/2013  . Abnormal uterine bleeding 11/29/2012  . Hemorrhagic ovarian cyst 12/26/2010  . Hemoperitoneum 12/25/2010    Past Surgical History:  Procedure Laterality Date  . BILATERAL SALPINGECTOMY N/A 06/19/2013   Procedure: BILATERAL SALPINGECTOMY;  Surgeon: Willodean Rosenthalarolyn Harraway-Smith, MD;  Location: [facility];  Service: Gynecology;  Laterality: N/A;  . DILITATION & CURRETTAGE/HYSTROSCOPY WITH NOVASURE ABLATION N/A 04/04/2013   Procedure: DILATATION & CURETTAGE/HYSTEROSCOPY WITH Attempted NOVASURE ABLATION ;  Surgeon: Willodean Rosenthalarolyn Harraway-Smith, MD;  Location: [facility];  Service:  Gynecology;  Laterality: N/A;  . HYSTEROSCOPY N/A 05/26/2013   Procedure: UNSUCCESSFUL HYSTEROSCOPY WITH HYDROTHERMAL ABLATION;  Surgeon: Willodean Rosenthalarolyn Harraway-Smith, MD;  Location: [facility];  Service: Gynecology;  Laterality: N/A;  . LAPAROSCOPY  12/25/2010   Procedure: LAPAROSCOPY OPERATIVE;  Surgeon: Roseanna RainbowLisa A Jackson-Moore, MD;  Location: [facility];  Service: Gynecology;  Laterality: N/A;  Operative laparoscopy /cauterization of right hemorrhagic cyst wall. Removal of belly rings.  Marland Kitchen. LAPAROSCOPY FOR ECTOPIC PREGNANCY    . NOVASURE ABLATION N/A 05/26/2013   Procedure: UNSUCCESSFUL NOVASURE ABLATION;  Surgeon: Willodean Rosenthalarolyn Harraway-Smith, MD;  Location: [facility];  Service: Gynecology;  Laterality: N/A;  . uterine biopsy    . VAGINAL HYSTERECTOMY N/A 06/19/2013   Procedure:  TOTAL VAGINAL HYSTERECTOMY with bilateral salpingectomy;  Surgeon: Willodean Rosenthalarolyn Harraway-Smith, MD;  Location: [facility];  Service: Gynecology;  Laterality: N/A;    OB History    Gravida Para Term Preterm AB Living   4 1 1   3 1    SAB TAB Ectopic Multiple Live Births   1 2 0   1       Home Medications    Prior to Admission medications   Medication Sig Start Date End Date Taking? Authorizing Provider  Multiple Vitamin (MULTIVITAMIN WITH MINERALS) TABS tablet Take 1 tablet by mouth daily.   Yes [provider]  phentermine 37.5 MG capsule Take 37.5 mg by mouth daily.   Yes [provider]  metroNIDAZOLE (FLAGYL) 500 MG tablet Take 1 tablet (500 mg total) by mouth 2 (two) times daily. Patient  not taking: Reported on 05/24/2017 04/13/15   Arabella Merles, CNM    Family History Family History  Problem Relation Age of Onset  . Drug abuse Mother   . Arthritis Maternal Aunt   . Cancer Maternal Aunt   . Drug abuse Maternal Aunt   . Arthritis Maternal Uncle   . Alcohol abuse Maternal Uncle   . Cancer Maternal Uncle   . Drug abuse Maternal Uncle   . Cancer Maternal Grandmother   . Alcohol abuse Maternal Grandfather   . Cancer  Maternal Grandfather     Social History Social History   Tobacco Use  . Smoking status: Never Smoker  . Smokeless tobacco: Never Used  Substance Use Topics  . Alcohol use: Yes    Alcohol/week: 1.8 oz    Types: 3 Glasses of wine per week    Comment: socially  . Drug use: No     Allergies   Patient has no known allergies.   Review of Systems Review of Systems  Constitutional: Negative for chills and fever.  Respiratory: Negative for shortness of breath.   Cardiovascular: Negative for chest pain.  Gastrointestinal: Positive for abdominal pain. Negative for diarrhea, nausea and vomiting.  Genitourinary: Positive for decreased urine volume. Negative for dysuria.     Physical Exam Updated Vital Signs BP 114/75   Pulse 71   Temp 98.4 F (36.9 C) (Oral)   Resp 14   Ht 5\' 4"  (1.626 m)   LMP 06/19/2013   SpO2 100%   BMI 31.07 kg/m   Physical Exam  Constitutional: She is oriented to person, place, and time. She appears well-developed and well-nourished. No distress.  HENT:  Head: Normocephalic and atraumatic.  Eyes: Conjunctivae are normal. Pupils are equal, round, and reactive to light. Right eye exhibits no discharge. Left eye exhibits no discharge. No scleral icterus.  Neck: Normal range of motion.  Cardiovascular: Normal rate and regular rhythm. Exam reveals no gallop and no friction rub.  No murmur heard. Pulmonary/Chest: Effort normal and breath sounds normal. No respiratory distress.  Abdominal: Soft. Bowel sounds are normal. She exhibits distension. She exhibits no mass. There is tenderness (minimal, generalized tenderness. Pt points to epigastric area when she has pain). There is no rebound and no guarding. No hernia.  Neurological: She is alert and oriented to person, place, and time.  Skin: Skin is warm and dry.  Psychiatric: She has a normal mood and affect. Her behavior is normal.  Nursing note and vitals reviewed.    ED Treatments / Results   Labs (all labs ordered are listed, but only abnormal results are displayed) Labs Reviewed  COMPREHENSIVE METABOLIC PANEL - Abnormal; Notable for the following components:      Result Value   Potassium 3.2 (*)    Chloride 100 (*)    All other components within normal limits  URINALYSIS, ROUTINE W REFLEX MICROSCOPIC - Abnormal; Notable for the following components:   Hgb urine dipstick MODERATE (*)    Bacteria, UA RARE (*)    Squamous Epithelial / LPF 0-5 (*)    All other components within normal limits  LIPASE, BLOOD  CBC  I-STAT BETA HCG BLOOD, ED ([facility], [facility], [facility] ONLY)    EKG  EKG Interpretation None       Radiology No results found.  Procedures Procedures (including critical care time)  Medications Ordered in ED Medications  gi cocktail (Maalox,Lidocaine,Donnatal) (30 mLs Oral Given 05/24/17 0652)  sodium chloride 0.9 % bolus 1,000 mL (0  mLs Intravenous Stopped 05/24/17 0957)     Initial Impression / Assessment and Plan / ED Course  I have reviewed the triage vital signs and the nursing notes.  Pertinent labs & imaging results that were available during my care of the patient were reviewed by me and considered in my medical decision making (see chart for details).  42 year old female presents with epigastric abdominal pain worsened by eating. Likely gastritis. Vitals are normal. Abdomen is minimally tender. She is not ill appearing or in significant pain. CBC is normal. CMP is remarkable for mild hypokalemia. UA is remarkable for moderate hgb - she has chronic hematuria. Back pain is likely MSK. She was give a GI cocktail. She is requesting IVF bc she feels dehydrated. After fluids and medicine she feels a little better. She was PO challenged and reports a return of the pain but it is mild. She was given rx for Omeprazole and advised to cut back on alcohol and do diet modification. GI referral was given. Return precautions and work note given.  Final Clinical Impressions(s) /  ED Diagnoses   Final diagnoses:  Epigastric pain    ED Discharge Orders    None       Bethel Born, PA-C 05/24/17 1016    Rolan Bucco, MD 05/24/17 1032

## 2017-05-24 NOTE — Discharge Instructions (Addendum)
Please take Omeprazole once a day for the next 2 weeks. If this medicine works for you, it is over the counter Please try diet modifications and cut back/stop alcohol use Avoid NSAIDs (Ibuprofen, Naproxen, BC powder). Take Tylenol for pain if you need to Follow up with GI

## 2017-11-25 ENCOUNTER — Emergency Department (HOSPITAL_COMMUNITY)
Admission: EM | Admit: 2017-11-25 | Discharge: 2017-11-25 | Disposition: A | Discharge status: Home / Self Care | Payer: Medicaid Other | Attending: Emergency Medicine | Admitting: Emergency Medicine

## 2017-11-25 ENCOUNTER — Encounter (HOSPITAL_COMMUNITY): Payer: Self-pay | Admitting: Emergency Medicine

## 2017-11-25 DIAGNOSIS — Z79899 Other long term (current) drug therapy: Secondary | ICD-10-CM | POA: Insufficient documentation

## 2017-11-25 DIAGNOSIS — R1013 Epigastric pain: Secondary | ICD-10-CM | POA: Diagnosis present

## 2017-11-25 LAB — CBC
HEMATOCRIT: 35.7 % — AB (ref 36.0–46.0)
Hemoglobin: 11.7 g/dL — ABNORMAL LOW (ref 12.0–15.0)
MCH: 29.3 pg (ref 26.0–34.0)
MCHC: 32.8 g/dL (ref 30.0–36.0)
MCV: 89.5 fL (ref 78.0–100.0)
PLATELETS: 301 10*3/uL (ref 150–400)
RBC: 3.99 MIL/uL (ref 3.87–5.11)
RDW: 13.7 % (ref 11.5–15.5)
WBC: 6.4 10*3/uL (ref 4.0–10.5)

## 2017-11-25 LAB — DIFFERENTIAL
Abs Immature Granulocytes: 0 10*3/uL (ref 0.0–0.1)
BASOS ABS: 0.1 10*3/uL (ref 0.0–0.1)
BASOS PCT: 1 %
EOS PCT: 4 %
Eosinophils Absolute: 0.2 10*3/uL (ref 0.0–0.7)
IMMATURE GRANULOCYTES: 0 %
LYMPHS PCT: 18 %
Lymphs Abs: 1.1 10*3/uL (ref 0.7–4.0)
Monocytes Absolute: 0.5 10*3/uL (ref 0.1–1.0)
Monocytes Relative: 8 %
NEUTROS PCT: 69 %
Neutro Abs: 4.2 10*3/uL (ref 1.7–7.7)

## 2017-11-25 LAB — URINALYSIS, ROUTINE W REFLEX MICROSCOPIC
Bacteria, UA: NONE SEEN
Bilirubin Urine: NEGATIVE
GLUCOSE, UA: NEGATIVE mg/dL
Ketones, ur: 80 mg/dL — AB
NITRITE: NEGATIVE
PH: 6 (ref 5.0–8.0)
Protein, ur: NEGATIVE mg/dL
SPECIFIC GRAVITY, URINE: 1.02 (ref 1.005–1.030)

## 2017-11-25 LAB — COMPREHENSIVE METABOLIC PANEL
ALK PHOS: 40 U/L (ref 38–126)
ALT: 44 U/L (ref 0–44)
AST: 72 U/L — ABNORMAL HIGH (ref 15–41)
Albumin: 4.2 g/dL (ref 3.5–5.0)
Anion gap: 14 (ref 5–15)
BILIRUBIN TOTAL: 1.7 mg/dL — AB (ref 0.3–1.2)
BUN: 5 mg/dL — ABNORMAL LOW (ref 6–20)
CALCIUM: 9.5 mg/dL (ref 8.9–10.3)
CO2: 22 mmol/L (ref 22–32)
CREATININE: 0.88 mg/dL (ref 0.44–1.00)
Chloride: 100 mmol/L (ref 98–111)
GFR calc Af Amer: >60 mL/min (ref >60)
Glucose, Bld: 102 mg/dL — ABNORMAL HIGH (ref 70–99)
POTASSIUM: 3.8 mmol/L (ref 3.5–5.1)
Sodium: 136 mmol/L (ref 135–145)
TOTAL PROTEIN: 7.3 g/dL (ref 6.5–8.1)

## 2017-11-25 LAB — I-STAT BETA HCG BLOOD, ED (MC, WL, AP ONLY): I-stat hCG, quantitative: <5 m[IU]/mL (ref <5)

## 2017-11-25 LAB — LIPASE, BLOOD: Lipase: 34 U/L (ref 11–51)

## 2017-11-25 LAB — ETHANOL: Alcohol, Ethyl (B): <10 mg/dL (ref <10)

## 2017-11-25 MED ORDER — SODIUM CHLORIDE 0.9 % IV BOLUS
1000.0000 mL | Freq: Once | INTRAVENOUS | Status: AC
  Administered 2017-11-25: 1000 mL via INTRAVENOUS

## 2017-11-25 MED ORDER — ONDANSETRON HCL 4 MG/2ML IJ SOLN
4.0000 mg | Freq: Once | INTRAMUSCULAR | Status: AC
  Administered 2017-11-25: 4 mg via INTRAVENOUS
  Filled 2017-11-25: qty 2

## 2017-11-25 MED ORDER — GI COCKTAIL ~~LOC~~
30.0000 mL | Freq: Once | ORAL | Status: AC
  Administered 2017-11-25: 30 mL via ORAL
  Filled 2017-11-25: qty 30

## 2017-11-25 MED ORDER — CHLORDIAZEPOXIDE HCL 25 MG PO CAPS
50.0000 mg | ORAL_CAPSULE | Freq: Once | ORAL | Status: AC
Start: 2017-11-25 — End: 2017-11-25
  Administered 2017-11-25: 50 mg via ORAL
  Filled 2017-11-25: qty 2

## 2017-11-25 MED ORDER — SUCRALFATE 1 GM/10ML PO SUSP: 1.0000 g | Freq: Three times a day (TID) | ORAL | 0 refills | Status: DC

## 2017-11-25 MED ORDER — CHLORDIAZEPOXIDE HCL 25 MG PO CAPS: ORAL_CAPSULE | ORAL | 0 refills | Status: DC

## 2017-11-25 MED ORDER — FAMOTIDINE 20 MG PO TABS: 20.0000 mg | ORAL_TABLET | Freq: Two times a day (BID) | ORAL | 0 refills | Status: DC

## 2017-11-25 MED ORDER — FAMOTIDINE 20 MG PO TABS
40.0000 mg | ORAL_TABLET | Freq: Once | ORAL | Status: AC
  Administered 2017-11-25: 40 mg via ORAL
  Filled 2017-11-25: qty 2

## 2017-11-25 NOTE — ED Notes (Signed)
Case management at bedside.

## 2017-11-25 NOTE — ED Notes (Signed)
Pt stable, ambulatory, states understanding of discharge instructions 

## 2017-11-25 NOTE — ED Triage Notes (Signed)
Pt states she is having severe burning in her stomach, with nausea and vomiting. Patient reports she has been drinking heavily for the past few months, states she has had a pint of tequila every day for the past 4 days. Pt a/ox4, tearful in triage.

## 2017-11-25 NOTE — Discharge Instructions (Addendum)
Gastritis  Diet: Start with a clear liquid diet, progressed to a full liquid diet, and then bland solids as you are able. Please adhere to the enclosed dietary suggestions.  In general, avoid NSAIDs (i.e. ibuprofen, naproxen, etc.), caffeine, alcohol, spicy foods, fatty foods, or any other foods that seem to cause your symptoms to arise.  Prilosec: Take this medication daily, 20-30 minutes prior to your first meal, for the next 8 weeks.  Continue to take this medication even if you begin to feel better.  Pepcid: Take this medication twice a day for the next 5 days.  Carafate: Use the Carafate, as prescribed, for relief of gastritis symptoms.  Follow-up: Please follow-up with a primary care provider or gastroenterologist on this matter.  Please also follow-up with either one of these providers due to mildly elevated liver enzymes.  Return: Return to the ED for significantly worsening symptoms, persistent vomiting, persistent fever, vomiting blood, blood in the stools, dark stools, or any other major concerns.  Also return for confusion, seizures, or any other major concerns connected with alcohol withdrawal.  Take the Librium, as prescribed, to alleviate symptoms of alcohol withdrawal.

## 2017-11-25 NOTE — Care Management (Signed)
ED CM met with patient at bedside to discuss transitional resources. Patient presents to abdominal pains. Patient reports that she started heavy ETOH drinking over the past couple of months after losing job.  Patient states, she is seeking outpatient Alcohol Treatment Programs. CM discussed the several resources and provided information for PCP since patient states she does not have one at this time. CM spoke and  listened to patient in length. Provided patient at her request with AA information as well. Patient verbalized understanding and appreciation for the assistance. Updated EDP S. Joy PA-C he is agreeable with the transitional care plan.  Information sent to [facility] to follow up with patient on an ED follow up visit.  ED CM will reach out to patient within 2 days.

## 2017-11-25 NOTE — ED Provider Notes (Signed)
[facility] Provider Note   CSN: [id number] Arrival date & time: 11/25/17  [id number]     History   Chief Complaint Chief Complaint  Patient presents with  . Abdominal Pain    HPI Danielle Reilly is a 42 y.o. female.  HPI   Danielle Reilly is a 42 y.o. female, with a history of anemia and GERD, presenting to the ED with abdominal pain for the past several days. Pain is epigastric, radiates inferiorly, 8/10, burning in nature. Worsens with stress and drinking alcohol. States it feels like her gastritis. She is nauseous and has about one episode of nonbloody, nonbilious emesis a day.  She has had recurrences of the symptoms several times over the last few months.  Patient has been drinking about a pint of liquor a day for the past several months.  Last alcohol was around 9 PM last night. Denies illicit drug use.   Denies fever/chills, diarrhea, hematochezia/melena, hematemesis, CP, SOB, seizures, confusion, or any other complaints.     Past Medical History:  Diagnosis Date  . Anemia   . GERD (gastroesophageal reflux disease)    tums as needed  . SVD (spontaneous vaginal delivery)    x 1    Patient Active Problem List   Diagnosis Date Noted  . Postoperative state 06/19/2013  . Gingivitis, acute 01/29/2013  . Toothache 01/29/2013  . Abnormal uterine bleeding 11/29/2012  . Hemorrhagic ovarian cyst 12/26/2010  . Hemoperitoneum 12/25/2010    Past Surgical History:  Procedure Laterality Date  . BILATERAL SALPINGECTOMY N/A 06/19/2013   Procedure: BILATERAL SALPINGECTOMY;  Surgeon: Willodean Rosenthalarolyn Harraway-Smith, MD;  Location: [facility];  Service: Gynecology;  Laterality: N/A;  . DILITATION & CURRETTAGE/HYSTROSCOPY WITH NOVASURE ABLATION N/A 04/04/2013   Procedure: DILATATION & CURETTAGE/HYSTEROSCOPY WITH Attempted NOVASURE ABLATION ;  Surgeon: Willodean Rosenthalarolyn Harraway-Smith, MD;  Location: [facility];  Service: Gynecology;  Laterality: N/A;  . HYSTEROSCOPY N/A 05/26/2013   Procedure: UNSUCCESSFUL HYSTEROSCOPY WITH HYDROTHERMAL ABLATION;  Surgeon: Willodean Rosenthalarolyn Harraway-Smith, MD;  Location: [facility];  Service: Gynecology;  Laterality: N/A;  . LAPAROSCOPY  12/25/2010   Procedure: LAPAROSCOPY OPERATIVE;  Surgeon: Roseanna RainbowLisa A Jackson-Moore, MD;  Location: [facility];  Service: Gynecology;  Laterality: N/A;  Operative laparoscopy /cauterization of right hemorrhagic cyst wall. Removal of belly rings.  Marland Kitchen. LAPAROSCOPY FOR ECTOPIC PREGNANCY    . NOVASURE ABLATION N/A 05/26/2013   Procedure: UNSUCCESSFUL NOVASURE ABLATION;  Surgeon: Willodean Rosenthalarolyn Harraway-Smith, MD;  Location: [facility];  Service: Gynecology;  Laterality: N/A;  . uterine biopsy    . VAGINAL HYSTERECTOMY N/A 06/19/2013   Procedure:  TOTAL VAGINAL HYSTERECTOMY with bilateral salpingectomy;  Surgeon: Willodean Rosenthalarolyn Harraway-Smith, MD;  Location: [facility];  Service: Gynecology;  Laterality: N/A;     OB History    Gravida  4   Para  1   Term  1   Preterm      AB  3   Living  1     SAB  1   TAB  2   Ectopic  0   Multiple      Live Births  1            Home Medications    Prior to Admission medications   Medication Sig Start Date End Date Taking? Authorizing Provider  Multiple Vitamin (MULTIVITAMIN WITH MINERALS) TABS tablet Take 1 tablet by mouth daily.   Yes [provider]  omeprazole (PRILOSEC) 20 MG capsule Take 1 capsule (20 mg total) by mouth daily. 05/24/17  Yes  Bethel Born, PA-C  chlordiazePOXIDE (LIBRIUM) 25 MG capsule 50mg  PO TID x 1D, then 25-50mg  PO BID X 1D, then 25-50mg  PO QD X 1D 11/25/17   Kimberlyn Quiocho C, PA-C  famotidine (PEPCID) 20 MG tablet Take 1 tablet (20 mg total) by mouth 2 (two) times daily for 5 days. 11/25/17 11/30/17  Giada Schoppe C, PA-C  sucralfate (CARAFATE) 1 GM/10ML suspension Take 10 mLs (1 g total) by mouth 4 (four) times daily -  with meals and at bedtime. 11/25/17   Delpha Perko, Hillard Danker, PA-C    Family History Family History  Problem Relation Age of Onset  . Drug abuse Mother   .  Arthritis Maternal Aunt   . Cancer Maternal Aunt   . Drug abuse Maternal Aunt   . Arthritis Maternal Uncle   . Alcohol abuse Maternal Uncle   . Cancer Maternal Uncle   . Drug abuse Maternal Uncle   . Cancer Maternal Grandmother   . Alcohol abuse Maternal Grandfather   . Cancer Maternal Grandfather     Social History Social History   Tobacco Use  . Smoking status: Never Smoker  . Smokeless tobacco: Never Used  Substance Use Topics  . Alcohol use: Yes    Comment: 1 pint of liquor/day  . Drug use: No     Allergies   Patient has no known allergies.   Review of Systems Review of Systems  Constitutional: Negative for chills and fever.  Respiratory: Negative for shortness of breath.   Cardiovascular: Negative for chest pain.  Gastrointestinal: Positive for abdominal pain, nausea and vomiting. Negative for blood in stool, constipation and diarrhea.  All other systems reviewed and are negative.    Physical Exam Updated Vital Signs BP 134/77 (BP Location: Right Arm)   Pulse 90   Temp 98.7 F (37.1 C) (Oral)   Resp 19   LMP 06/19/2013   SpO2 100%   Physical Exam  Constitutional: She appears well-developed and well-nourished. No distress.  HENT:  Head: Normocephalic and atraumatic.  Eyes: Conjunctivae are normal.  Neck: Neck supple.  Cardiovascular: Normal rate, regular rhythm, normal heart sounds and intact distal pulses.  Pulmonary/Chest: Effort normal and breath sounds normal. No respiratory distress.  Abdominal: Soft. There is tenderness in the epigastric area. There is no guarding.  Musculoskeletal: She exhibits no edema.  Lymphadenopathy:    She has no cervical adenopathy.  Neurological: She is alert.  Sensation grossly intact to light touch in the extremities. Strength 5/5 in all extremities. No gait disturbance. Coordination intact. Cranial nerves III-XII grossly intact. No facial droop.   Skin: Skin is warm and dry. She is not diaphoretic.  Psychiatric:  She has a normal mood and affect. Her behavior is normal.  Nursing note and vitals reviewed.    ED Treatments / Results  Labs (all labs ordered are listed, but only abnormal results are displayed) Labs Reviewed  COMPREHENSIVE METABOLIC PANEL - Abnormal; Notable for the following components:      Result Value   Glucose, Bld 102 (*)    BUN 5 (*)    AST 72 (*)    Total Bilirubin 1.7 (*)    All other components within normal limits  CBC - Abnormal; Notable for the following components:   Hemoglobin 11.7 (*)    HCT 35.7 (*)    All other components within normal limits  URINALYSIS, ROUTINE W REFLEX MICROSCOPIC - Abnormal; Notable for the following components:   APPearance HAZY (*)    Hgb  urine dipstick MODERATE (*)    Ketones, ur 80 (*)    Leukocytes, UA TRACE (*)    All other components within normal limits  LIPASE, BLOOD  ETHANOL  DIFFERENTIAL  I-STAT BETA HCG BLOOD, ED ([facility], [facility], [facility] ONLY)    EKG None  Radiology No results found.  Procedures Procedures (including critical care time)  Medications Ordered in ED Medications  gi cocktail (Maalox,Lidocaine,Donnatal) (30 mLs Oral Given 11/25/17 1057)  sodium chloride 0.9 % bolus 1,000 mL (0 mLs Intravenous Stopped 11/25/17 1315)  ondansetron (ZOFRAN) injection 4 mg (4 mg Intravenous Given 11/25/17 1057)  chlordiazePOXIDE (LIBRIUM) capsule 50 mg (50 mg Oral Given 11/25/17 1414)  famotidine (PEPCID) tablet 40 mg (40 mg Oral Given 11/25/17 1418)     Initial Impression / Assessment and Plan / ED Course  I have reviewed the triage vital signs and the nursing notes.  Pertinent labs & imaging results that were available during my care of the patient were reviewed by me and considered in my medical decision making (see chart for details).     Patient presents with intermittent epigastric abdominal pain that she states is connected with her alcohol consumption. Patient is nontoxic appearing, afebrile, not tachycardic, not tachypneic,  not hypotensive, maintains SPO2 of 100% on room air, and is in no apparent distress.  Patient does not appear to have symptoms of acute withdrawal.  Repeat abdominal exam benign. We discussed the minor AST and total bilirubin elevations; patient states she has had these elevations before.  She has no right upper quadrant tenderness or pain and her pattern of symptoms seems to more closely support alcoholic gastritis than biliary colic. Because of this and the patient's expressed desire to stop her alcohol consumption, we agreed to try symptomatic treatment as well as a Librium taper.  An order was placed for outpatient right upper quadrant ultrasound should the patient continued to have symptoms.  The patient was given instructions for home care as well as return precautions. Patient voices understanding of these instructions, accepts the plan, and is comfortable with discharge.  Vitals:   11/25/17 1400 11/25/17 1415 11/25/17 1418 11/25/17 1440  BP: 126/74 110/89    Pulse: 87 88  80  Resp:      Temp:      TempSrc:      SpO2: 100% 100%  100%  Weight:   68 kg   Height:   5\' 5"  (1.651 m)      Final Clinical Impressions(s) / ED Diagnoses   Final diagnoses:  Epigastric pain    ED Discharge Orders         Ordered    sucralfate (CARAFATE) 1 GM/10ML suspension  3 times daily with meals & bedtime     11/25/17 1425    famotidine (PEPCID) 20 MG tablet  2 times daily     11/25/17 1425    chlordiazePOXIDE (LIBRIUM) 25 MG capsule     11/25/17 1425           Anselm Pancoast, PA-C 11/25/17 1611    Vanetta Mulders, MD 11/27/17 [phone]

## 2018-02-06 ENCOUNTER — Ambulatory Visit
Admission: EM | Admit: 2018-02-06 | Discharge: 2018-02-06 | Disposition: A | Discharge status: Home / Self Care | Payer: Medicaid Other | Attending: Physician Assistant | Admitting: Physician Assistant

## 2018-02-06 ENCOUNTER — Encounter: Payer: Self-pay | Admitting: Emergency Medicine

## 2018-02-06 ENCOUNTER — Other Ambulatory Visit: Payer: Self-pay

## 2018-02-06 DIAGNOSIS — M7522 Bicipital tendinitis, left shoulder: Secondary | ICD-10-CM

## 2018-02-06 DIAGNOSIS — M25512 Pain in left shoulder: Secondary | ICD-10-CM

## 2018-02-06 DIAGNOSIS — S46912A Strain of unspecified muscle, fascia and tendon at shoulder and upper arm level, left arm, initial encounter: Secondary | ICD-10-CM

## 2018-02-06 DIAGNOSIS — X58XXXA Exposure to other specified factors, initial encounter: Secondary | ICD-10-CM

## 2018-02-06 MED ORDER — IBUPROFEN 800 MG PO TABS: 800.0000 mg | ORAL_TABLET | Freq: Three times a day (TID) | ORAL | 0 refills | Status: DC

## 2018-02-06 NOTE — Discharge Instructions (Signed)
TENDINITIS/STRAIN: Your condition is consistent with overuse tendinitis and/or shoulder strain. Stressed avoiding painful activities . Reviewed RICE guidelines. Use medications as directed, including NSAIDs. I have explained that it is acceptable to take NSAIDs three times daily and they should not cause drowsiness. May use Tylenol in between doses of NSAIDs. F/u with [facility] or return to our office for reexamination if worsening or no improvement in the next 2 weeks, and please feel free to call or return at any time for any questions or concerns you may have and we will be happy to help you!

## 2018-02-06 NOTE — ED Provider Notes (Signed)
[facility]    CSN: [id number] Arrival date & time: 02/06/18  1058     History   Chief Complaint Chief Complaint  Patient presents with  . Shoulder Pain    left    HPI Danielle Reilly is a 42 y.o. female. African American female presents with worsening left shoulder pain over the past 5 days. States she work up last Friday with the pain. 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 She has pain with raising her arm above her head. Denies neck pain, extremity numbness/weakness/tingling. She has never had any issues with her shoulder before. She says the pain is 9/10 and constant. She has not taken any medication for pain or symptoms. She has no other concerns or complaints today.  HPI  Past Medical History:  Diagnosis Date  . Anemia   . GERD (gastroesophageal reflux disease)    tums as needed  . SVD (spontaneous vaginal delivery)    x 1    Patient Active Problem List   Diagnosis Date Noted  . Postoperative state 06/19/2013  . Gingivitis, acute 01/29/2013  . Toothache 01/29/2013  . Abnormal uterine bleeding 11/29/2012  . Hemorrhagic ovarian cyst 12/26/2010  . Hemoperitoneum 12/25/2010    Past Surgical History:  Procedure Laterality Date  . BILATERAL SALPINGECTOMY N/A 06/19/2013   Procedure: BILATERAL SALPINGECTOMY;  Surgeon: Willodean Rosenthal, MD;  Location: [facility];  Service: Gynecology;  Laterality: N/A;  . DILITATION & CURRETTAGE/HYSTROSCOPY WITH NOVASURE ABLATION N/A 04/04/2013   Procedure: DILATATION & CURETTAGE/HYSTEROSCOPY WITH Attempted NOVASURE ABLATION ;  Surgeon: Willodean Rosenthal, MD;  Location: [facility];  Service: Gynecology;  Laterality: N/A;  . HYSTEROSCOPY N/A 05/26/2013   Procedure: UNSUCCESSFUL HYSTEROSCOPY WITH HYDROTHERMAL ABLATION;  Surgeon: Willodean Rosenthal, MD;  Location: [facility];  Service: Gynecology;  Laterality: N/A;  . LAPAROSCOPY  12/25/2010   Procedure: LAPAROSCOPY OPERATIVE;   Surgeon: Roseanna Rainbow, MD;  Location: [facility];  Service: Gynecology;  Laterality: N/A;  Operative laparoscopy /cauterization of right hemorrhagic cyst wall. Removal of belly rings.  Marland Kitchen LAPAROSCOPY FOR ECTOPIC PREGNANCY    . NOVASURE ABLATION N/A 05/26/2013   Procedure: UNSUCCESSFUL NOVASURE ABLATION;  Surgeon: Willodean Rosenthal, MD;  Location: [facility];  Service: Gynecology;  Laterality: N/A;  . uterine biopsy    . VAGINAL HYSTERECTOMY N/A 06/19/2013   Procedure:  TOTAL VAGINAL HYSTERECTOMY with bilateral salpingectomy;  Surgeon: Willodean Rosenthal, MD;  Location: [facility];  Service: Gynecology;  Laterality: N/A;    OB History    Gravida  4   Para  1   Term  1   Preterm      AB  3   Living  1     SAB  1   TAB  2   Ectopic  0   Multiple      Live Births  1            Home Medications    Prior to Admission medications   Medication Sig Start Date End Date Taking? Authorizing Provider  famotidine (PEPCID) 20 MG tablet Take 1 tablet (20 mg total) by mouth 2 (two) times daily for 5 days. 11/25/17 02/06/18 Yes Joy, Shawn C, PA-C  Multiple Vitamin (MULTIVITAMIN WITH MINERALS) TABS tablet Take 1 tablet by mouth daily.   Yes [provider]  chlordiazePOXIDE (LIBRIUM) 25 MG capsule 50mg  PO TID x 1D, then 25-50mg  PO BID X 1D, then 25-50mg  PO QD X 1D  11/25/17   Joy, Shawn C, PA-C  ibuprofen (ADVIL,MOTRIN) 800 MG tablet Take 1 tablet (800 mg total) by mouth 3 (three) times daily. 02/06/18   Shirlee Latch, PA-C  omeprazole (PRILOSEC) 20 MG capsule Take 1 capsule (20 mg total) by mouth daily. 05/24/17   Bethel Born, PA-C  sucralfate (CARAFATE) 1 GM/10ML suspension Take 10 mLs (1 g total) by mouth 4 (four) times daily -  with meals and at bedtime. 11/25/17   Joy, Hillard Danker, PA-C    Family History Family History  Problem Relation Age of Onset  . Drug abuse Mother   . Arthritis Maternal Aunt   . Cancer Maternal Aunt   . Drug abuse Maternal Aunt   . Arthritis  Maternal Uncle   . Alcohol abuse Maternal Uncle   . Cancer Maternal Uncle   . Drug abuse Maternal Uncle   . Cancer Maternal Grandmother   . Alcohol abuse Maternal Grandfather   . Cancer Maternal Grandfather     Social History Social History   Tobacco Use  . Smoking status: Never Smoker  . Smokeless tobacco: Never Used  Substance Use Topics  . Alcohol use: Yes    Comment: 1 pint of liquor/day  . Drug use: No     Allergies   Patient has no known allergies.   Review of Systems Review of Systems  Constitutional: Negative for fatigue and fever.  Respiratory: Negative for shortness of breath.   Cardiovascular: Negative for chest pain and palpitations.  Gastrointestinal: Negative for nausea and vomiting.  Musculoskeletal: Positive for arthralgias (left shoulder). Negative for back pain, joint swelling, myalgias, neck pain and neck stiffness.  Skin: Negative for rash and wound.  Neurological: Negative for dizziness, weakness, numbness and headaches.     Physical Exam Triage Vital Signs ED Triage Vitals [02/06/18 1121]  Enc Vitals Group     BP (!) 122/93     Pulse Rate 83     Resp 16     Temp 98.2 F (36.8 C)     Temp Source Oral     SpO2 100 %     Weight 160 lb (72.6 kg)     Height 5\' 4"  (1.626 m)     Head Circumference      Peak Flow      Pain Score 9     Pain Loc      Pain Edu?      Excl. in GC?    No data found.  Updated Vital Signs BP (!) 122/93 (BP Location: Left Arm)   Pulse 83   Temp 98.2 F (36.8 C) (Oral)   Resp 16   Ht 5\' 4"  (1.626 m)   Wt 160 lb (72.6 kg)   LMP 06/19/2013   SpO2 100%   BMI 27.46 kg/m    Physical Exam  Constitutional: She is oriented to person, place, and time. She appears well-developed and well-nourished.  HENT:  Head: Normocephalic and atraumatic.  Eyes: No scleral icterus.  Neck: Normal range of motion. Neck supple.  Cardiovascular: Normal rate, regular rhythm and normal heart sounds.  No murmur  heard. Pulmonary/Chest: Effort normal and breath sounds normal. No respiratory distress. She has no wheezes.  Musculoskeletal:  LEFT SHOULDER: No deformity, erythema or swelling. TTP of anterior shoulder in biceps groove. + Neers, +Empty Can test, painful and reduced ROM past 90 degrees flexion and abduction. 5/5 strength bilat UEs, NVI  Neurological: She is alert and oriented to person, place, and time.  Skin: Skin is warm and dry.  Psychiatric: She has a normal mood and affect. Her behavior is normal.  Nursing note and vitals reviewed.    UC Treatments / Results  Labs (all labs ordered are listed, but only abnormal results are displayed) Labs Reviewed - No data to display  EKG None  Radiology No results found.  Procedures Procedures (including critical care time)  Medications Ordered in UC Medications - No data to display  Initial Impression / Assessment and Plan / UC Course  I have reviewed the triage vital signs and the nursing notes.  Pertinent labs & imaging results that were available during my care of the patient were reviewed by me and considered in my medical decision making (see chart for details).   Since there was no specific injury, explained to patient x-rays are not indicated and pain is likely due to overuse tendinitis. Advised activity as tolerated and to limit heavy lifting and overhead lifting so condition does not worsen. Advised NSAIDs and Tylenol for relief of pain and to being daily at home shoulder exercises to improve ROM. Advised to f/u with PCP or [facility] walk in clinic if worsening of no improvement in the next 1-2 weeks.   Final Clinical Impressions(s) / UC Diagnoses   Final diagnoses:  Left shoulder strain, initial encounter  Biceps tendinitis of left shoulder  Acute pain of left shoulder     Discharge Instructions     TENDINITIS/STRAIN: Your condition is consistent with overuse tendinitis and/or shoulder strain. Stressed avoiding  painful activities . Reviewed RICE guidelines. Use medications as directed, including NSAIDs. I have explained that it is acceptable to take NSAIDs three times daily and they should not cause drowsiness. May use Tylenol in between doses of NSAIDs. F/u with [facility] or return to our office for reexamination if worsening or no improvement in the next 2 weeks, and please feel free to call or return at any time for any questions or concerns you may have and we will be happy to help you!     ED Prescriptions    Medication Sig Dispense Auth. Provider   ibuprofen (ADVIL,MOTRIN) 800 MG tablet Take 1 tablet (800 mg total) by mouth 3 (three) times daily. 21 tablet Shirlee Latch, PA-C     Controlled Substance Prescriptions [place] Controlled Substance Registry consulted? Not Applicable   Gareth Morgan 02/06/18 1710

## 2018-02-06 NOTE — ED Triage Notes (Signed)
Patient in today c/o left shoulder pain x 5 days. 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 No injury noted.

## 2018-06-23 ENCOUNTER — Encounter (HOSPITAL_COMMUNITY): Payer: Self-pay | Admitting: Emergency Medicine

## 2018-06-23 ENCOUNTER — Other Ambulatory Visit: Payer: Self-pay

## 2018-06-23 ENCOUNTER — Emergency Department (HOSPITAL_COMMUNITY)
Admission: EM | Admit: 2018-06-23 | Discharge: 2018-06-23 | Disposition: A | Discharge status: Home / Self Care | Payer: Medicaid Other | Attending: Emergency Medicine | Admitting: Emergency Medicine

## 2018-06-23 DIAGNOSIS — Z79899 Other long term (current) drug therapy: Secondary | ICD-10-CM | POA: Insufficient documentation

## 2018-06-23 DIAGNOSIS — F10239 Alcohol dependence with withdrawal, unspecified: Secondary | ICD-10-CM | POA: Insufficient documentation

## 2018-06-23 DIAGNOSIS — F419 Anxiety disorder, unspecified: Secondary | ICD-10-CM | POA: Insufficient documentation

## 2018-06-23 LAB — COMPREHENSIVE METABOLIC PANEL
ALT: 38 U/L (ref 0–44)
ANION GAP: 15 (ref 5–15)
AST: 64 U/L — ABNORMAL HIGH (ref 15–41)
Albumin: 4.7 g/dL (ref 3.5–5.0)
Alkaline Phosphatase: 51 U/L (ref 38–126)
BUN: 6 mg/dL (ref 6–20)
CO2: 20 mmol/L — ABNORMAL LOW (ref 22–32)
Calcium: 9.7 mg/dL (ref 8.9–10.3)
Chloride: 98 mmol/L (ref 98–111)
Creatinine, Ser: 0.86 mg/dL (ref 0.44–1.00)
GFR calc Af Amer: >60 mL/min (ref >60)
GFR calc non Af Amer: >60 mL/min (ref >60)
Glucose, Bld: 149 mg/dL — ABNORMAL HIGH (ref 70–99)
Potassium: 3.8 mmol/L (ref 3.5–5.1)
Sodium: 133 mmol/L — ABNORMAL LOW (ref 135–145)
Total Bilirubin: 2.2 mg/dL — ABNORMAL HIGH (ref 0.3–1.2)
Total Protein: 8 g/dL (ref 6.5–8.1)

## 2018-06-23 LAB — CBC
HCT: 38.7 % (ref 36.0–46.0)
Hemoglobin: 13 g/dL (ref 12.0–15.0)
MCH: 29.5 pg (ref 26.0–34.0)
MCHC: 33.6 g/dL (ref 30.0–36.0)
MCV: 87.8 fL (ref 80.0–100.0)
Platelets: 279 10*3/uL (ref 150–400)
RBC: 4.41 MIL/uL (ref 3.87–5.11)
RDW: 13.4 % (ref 11.5–15.5)
WBC: 8.2 10*3/uL (ref 4.0–10.5)
nRBC: 0 % (ref 0.0–0.2)

## 2018-06-23 LAB — ETHANOL: Alcohol, Ethyl (B): <10 mg/dL (ref <10)

## 2018-06-23 LAB — RAPID URINE DRUG SCREEN, HOSP PERFORMED
Amphetamines: NOT DETECTED
BENZODIAZEPINES: POSITIVE — AB
Barbiturates: NOT DETECTED
Cocaine: NOT DETECTED
Opiates: NOT DETECTED
Tetrahydrocannabinol: NOT DETECTED

## 2018-06-23 MED ORDER — LORAZEPAM 1 MG PO TABS
1.0000 mg | ORAL_TABLET | Freq: Once | ORAL | Status: AC
  Administered 2018-06-23: 1 mg via ORAL
  Filled 2018-06-23: qty 1

## 2018-06-23 MED ORDER — LORAZEPAM 2 MG/ML IJ SOLN
1.0000 mg | Freq: Once | INTRAMUSCULAR | Status: AC
  Administered 2018-06-23: 1 mg via INTRAVENOUS
  Filled 2018-06-23: qty 1

## 2018-06-23 MED ORDER — LORAZEPAM 2 MG/ML IJ SOLN
2.0000 mg | Freq: Once | INTRAMUSCULAR | Status: AC
  Administered 2018-06-23: 2 mg via INTRAVENOUS
  Filled 2018-06-23: qty 1

## 2018-06-23 MED ORDER — SODIUM CHLORIDE 0.9 % IV BOLUS (SEPSIS)
1000.0000 mL | Freq: Once | INTRAVENOUS | Status: AC
  Administered 2018-06-23: 1000 mL via INTRAVENOUS

## 2018-06-23 MED ORDER — CHLORDIAZEPOXIDE HCL 25 MG PO CAPS: ORAL_CAPSULE | ORAL | 0 refills | Status: DC

## 2018-06-23 MED ORDER — CHLORDIAZEPOXIDE HCL 25 MG PO CAPS
100.0000 mg | ORAL_CAPSULE | Freq: Once | ORAL | Status: AC
  Administered 2018-06-23: 100 mg via ORAL
  Filled 2018-06-23: qty 4

## 2018-06-23 MED ORDER — SODIUM CHLORIDE 0.9 % IV BOLUS
1000.0000 mL | Freq: Once | INTRAVENOUS | Status: AC
  Administered 2018-06-23: 1000 mL via INTRAVENOUS

## 2018-06-23 NOTE — Discharge Instructions (Addendum)
Please take Librium as directed beginning tomorrow. Follow-up with your primary care provider. Return to the ED if you start to have worsening symptoms, increased tremors, hallucinations, chest pain, shortness of breath.

## 2018-06-23 NOTE — ED Triage Notes (Signed)
The patient presents today and says she thinks she is having alcohol withdrawal symptoms.  Says she feels like her skin is on fire, N/V, diarrhea, hallucinations and complains abdominal discomfort.  She says she was drinking a pint a day for three years and she stopped in December with help of treatment in [place] (not sure of the name, [facility] something).  She was sober until two weeks ago.  She was drinking a pint a day as of two weeks ago.  She tried to stop on her own a day an a half ago.  She denies anything else other than the symptoms listed.

## 2018-06-23 NOTE — ED Notes (Signed)
Pt taken to recliners in green hallway, provided with blanket while she waits for exam room. Pt states last drink was yesterday morning, drink of choice is tequila. Will continue to monitor.

## 2018-06-23 NOTE — ED Notes (Signed)
Patient states she feels slightly better, tremors still present.

## 2018-06-23 NOTE — ED Provider Notes (Addendum)
[facility] EMERGENCY DEPARTMENT Provider Note   CSN: [id number] Arrival date & time: 06/23/18  1333    History   Chief Complaint Chief Complaint  Patient presents with  . Alcohol Intoxication    HPI Devonna Oboyle is a 43 y.o. female with a past medical history of GERD, anemia, who presents to ED with a chief complaint of alcohol withdrawal symptoms. Patient endorses drinking a pint of tequila everyday for the past three years. She was able to quit drinking alcohol in December 2019 with the help of a treatment center. She began drinking again 2 weeks ago, continuing with 1 pint of tequila daily. Her last drink was ~24hrs ago. She reports abdominal discomfort, nausea, diarrhea, hallucinations at night (seeing shadows), tremors. She has not suffered from seizures or alcohol withdrawal symptoms in the past. She has not tried any medications at home to help with her symptoms. She denies any shortness of breath, injuries or falls, numbness in arms or legs.     HPI  Past Medical History:  Diagnosis Date  . Anemia   . GERD (gastroesophageal reflux disease)    tums as needed  . SVD (spontaneous vaginal delivery)    x 1    Patient Active Problem List   Diagnosis Date Noted  . Postoperative state 06/19/2013  . Gingivitis, acute 01/29/2013  . Toothache 01/29/2013  . Abnormal uterine bleeding 11/29/2012  . Hemorrhagic ovarian cyst 12/26/2010  . Hemoperitoneum 12/25/2010    Past Surgical History:  Procedure Laterality Date  . BILATERAL SALPINGECTOMY N/A 06/19/2013   Procedure: BILATERAL SALPINGECTOMY;  Surgeon: Willodean Rosenthal, MD;  Location: [facility];  Service: Gynecology;  Laterality: N/A;  . DILITATION & CURRETTAGE/HYSTROSCOPY WITH NOVASURE ABLATION N/A 04/04/2013   Procedure: DILATATION & CURETTAGE/HYSTEROSCOPY WITH Attempted NOVASURE ABLATION ;  Surgeon: Willodean Rosenthal, MD;  Location: [facility];  Service: Gynecology;  Laterality: N/A;  . HYSTEROSCOPY N/A  05/26/2013   Procedure: UNSUCCESSFUL HYSTEROSCOPY WITH HYDROTHERMAL ABLATION;  Surgeon: Willodean Rosenthal, MD;  Location: [facility];  Service: Gynecology;  Laterality: N/A;  . LAPAROSCOPY  12/25/2010   Procedure: LAPAROSCOPY OPERATIVE;  Surgeon: Roseanna Rainbow, MD;  Location: [facility];  Service: Gynecology;  Laterality: N/A;  Operative laparoscopy /cauterization of right hemorrhagic cyst wall. Removal of belly rings.  Marland Kitchen LAPAROSCOPY FOR ECTOPIC PREGNANCY    . NOVASURE ABLATION N/A 05/26/2013   Procedure: UNSUCCESSFUL NOVASURE ABLATION;  Surgeon: Willodean Rosenthal, MD;  Location: [facility];  Service: Gynecology;  Laterality: N/A;  . uterine biopsy    . VAGINAL HYSTERECTOMY N/A 06/19/2013   Procedure:  TOTAL VAGINAL HYSTERECTOMY with bilateral salpingectomy;  Surgeon: Willodean Rosenthal, MD;  Location: [facility];  Service: Gynecology;  Laterality: N/A;     OB History    Gravida  4   Para  1   Term  1   Preterm      AB  3   Living  1     SAB  1   TAB  2   Ectopic  0   Multiple      Live Births  1            Home Medications    Prior to Admission medications   Medication Sig Start Date End Date Taking? Authorizing Provider  chlordiazePOXIDE (LIBRIUM) 25 MG capsule  PO TID x 1D, then 25-50mg  PO BID X 1D, then 25-50mg  PO QD X 1D 06/23/18   Jakaila Norment, PA-C  famotidine (PEPCID) 20 MG tablet Take 1 tablet (20  mg total) by mouth 2 (two) times daily for 5 days. 11/25/17 02/06/18  Joy, Shawn C, PA-C  ibuprofen (ADVIL,MOTRIN) 800 MG tablet Take 1 tablet (800 mg total) by mouth 3 (three) times daily. 02/06/18   Shirlee Latch, PA-C  Multiple Vitamin (MULTIVITAMIN WITH MINERALS) TABS tablet Take 1 tablet by mouth daily.    [provider]  omeprazole (PRILOSEC) 20 MG capsule Take 1 capsule (20 mg total) by mouth daily. 05/24/17   Bethel Born, PA-C  sucralfate (CARAFATE) 1 GM/10ML suspension Take 10 mLs (1 g total) by mouth 4 (four) times daily -  with meals and  at bedtime. 11/25/17   Joy, Hillard Danker, PA-C    Family History Family History  Problem Relation Age of Onset  . Drug abuse Mother   . Arthritis Maternal Aunt   . Cancer Maternal Aunt   . Drug abuse Maternal Aunt   . Arthritis Maternal Uncle   . Alcohol abuse Maternal Uncle   . Cancer Maternal Uncle   . Drug abuse Maternal Uncle   . Cancer Maternal Grandmother   . Alcohol abuse Maternal Grandfather   . Cancer Maternal Grandfather     Social History Social History   Tobacco Use  . Smoking status: Never Smoker  . Smokeless tobacco: Never Used  Substance Use Topics  . Alcohol use: Yes    Comment: 1 pint of liquor/day  . Drug use: No     Allergies   Patient has no known allergies.   Review of Systems Review of Systems  Constitutional: Negative for appetite change, chills and fever.  HENT: Negative for ear pain, rhinorrhea, sneezing and sore throat.   Eyes: Negative for photophobia and visual disturbance.  Respiratory: Negative for cough, chest tightness, shortness of breath and wheezing.   Cardiovascular: Negative for chest pain and palpitations.  Gastrointestinal: Positive for abdominal pain, diarrhea, nausea and vomiting. Negative for blood in stool and constipation.  Genitourinary: Negative for dysuria, hematuria and urgency.  Musculoskeletal: Negative for myalgias.  Skin: Negative for rash.  Neurological: Positive for tremors. Negative for dizziness, weakness and light-headedness.     Physical Exam Updated Vital Signs BP 117/62   Pulse (!) 117   Temp 98.4 F (36.9 C) (Oral)   Resp 19   Ht  (1.651 m)   LMP 06/19/2013   SpO2 98%   BMI 26.63 kg/m   Physical Exam Vitals signs and nursing note reviewed.  Constitutional:      General: She is not in acute distress.    Appearance: She is well-developed.  HENT:     Head: Normocephalic and atraumatic.     Nose: Nose normal.  Eyes:     General: No scleral icterus.       Right eye: No discharge.         Left eye: No discharge.     Conjunctiva/sclera: Conjunctivae normal.  Neck:     Musculoskeletal: Normal range of motion and neck supple.  Cardiovascular:     Rate and Rhythm: Regular rhythm. Tachycardia present.     Heart sounds: Normal heart sounds. No murmur. No friction rub. No gallop.   Pulmonary:     Effort: Pulmonary effort is normal. No respiratory distress.     Breath sounds: Normal breath sounds.  Abdominal:     General: Bowel sounds are normal. There is no distension.     Palpations: Abdomen is soft.     Tenderness: There is no abdominal tenderness. There is no  guarding.  Musculoskeletal: Normal range of motion.  Skin:    General: Skin is warm and dry.     Findings: No rash.  Neurological:     Mental Status: She is alert.     Motor: No abnormal muscle tone.     Coordination: Coordination normal.  Psychiatric:        Mood and Affect: Mood is anxious.      ED Treatments / Results  Labs (all labs ordered are listed, but only abnormal results are displayed) Labs Reviewed  COMPREHENSIVE METABOLIC PANEL - Abnormal; Notable for the following components:      Result Value   Sodium 133 (*)    CO2 20 (*)    Glucose, Bld 149 (*)    AST 64 (*)    Total Bilirubin 2.2 (*)    All other components within normal limits  RAPID URINE DRUG SCREEN, HOSP PERFORMED - Abnormal; Notable for the following components:   Benzodiazepines POSITIVE (*)    All other components within normal limits  ETHANOL  CBC    EKG None  Radiology No results found.  Procedures Procedures (including critical care time)  Medications Ordered in ED Medications  LORazepam (ATIVAN) tablet 1 mg (1 mg Oral Given 06/23/18 1419)  LORazepam (ATIVAN) injection 1 mg (1 mg Intravenous Given 06/23/18 1538)  sodium chloride 0.9 % bolus 1,000 mL (1,000 mLs Intravenous New Bag/Given 06/23/18 1538)  LORazepam (ATIVAN) injection 2 mg (2 mg Intravenous Given 06/23/18 1626)  chlordiazePOXIDE (LIBRIUM) capsule 100 mg  (100 mg Oral Given 06/23/18 1626)  sodium chloride 0.9 % bolus 1,000 mL (1,000 mLs Intravenous New Bag/Given 06/23/18 1724)     Initial Impression / Assessment and Plan / ED Course  I have reviewed the triage vital signs and the nursing notes.  Pertinent labs & imaging results that were available during my care of the patient were reviewed by me and considered in my medical decision making (see chart for details).  Clinical Course as of Jun 22 1836  Wynelle Link Jun 23, 2018  1710 Patient resting comfortably after librium and ativan 2mg  IV. Still tachycardic ~110s. Will proceed with second liter of fluid IV.   [HK]  1821 CIWA score 0 after medications   [HK]    Clinical Course User Index [HK] Dietrich Pates, PA-C       43 year old female with past medical history of alcohol abuse presents to ED for concern for alcohol withdrawal symptoms.  She generally drinks a pint of liquor daily and has been for the past 2 weeks.  She was sober from December 2019 up until 2 weeks ago.  Prior to that, she would drink a pint of liquor for the past 3 years.  She did detox last December with the help of the treatment center in [place].  She reports abdominal discomfort, nausea, vomiting, diarrhea, skin irritation, hallucinations and tremors.  Last drink was 24 hours ago.  Alcohol level here is less than 10.  CMP with increasing T bili to 2.2, AST to 64, mild hyponatremia 133.  Suspect that this is all related to her alcohol abuse.  Abdomen is soft, nontender nondistended.  Other vital signs are within normal limits with exception of tachycardia.  Initial CIWA score is 10.  Patient was given a total of 3 mg of IV Ativan, 1 mg of p.o. Ativan, 100 mg of Librium and 2 L of fluid.  This resulted in CIWA score of 0.  Patient continues to rest comfortably without tremors  or hallucinations.  Requesting discharge home. Will rx with Librium taper and resource guide for detox.  Advised patient to return to ED for any severe worsening  symptoms.   Patient is hemodynamically stable, in NAD, and able to ambulate in the ED. Evaluation does not show pathology that would require ongoing emergent intervention or inpatient treatment. I explained the diagnosis to the patient. Pain has been managed and has no complaints prior to discharge. Patient is comfortable with above plan and is stable for discharge at this time. All questions were answered prior to disposition. Strict return precautions for returning to the ED were discussed. Encouraged follow up with PCP.    Portions of this note were generated with Scientist, clinical (histocompatibility and immunogenetics). Dictation errors may occur despite best attempts at proofreading.  Final Clinical Impressions(s) / ED Diagnoses   Final diagnoses:  Alcohol dependence with withdrawal with complication [place])    ED Discharge Orders         Ordered    chlordiazePOXIDE (LIBRIUM) 25 MG capsule     06/23/18 1826            Dietrich Pates, PA-C 06/23/18 1839    Melene Plan, DO 06/23/18 1840

## 2018-07-10 ENCOUNTER — Encounter (HOSPITAL_COMMUNITY): Payer: Self-pay | Admitting: *Deleted

## 2018-07-10 ENCOUNTER — Other Ambulatory Visit: Payer: Self-pay

## 2018-07-10 ENCOUNTER — Emergency Department (HOSPITAL_COMMUNITY)
Admission: EM | Admit: 2018-07-10 | Discharge: 2018-07-10 | Disposition: A | Discharge status: Home / Self Care | Payer: 59 | Attending: Emergency Medicine | Admitting: Emergency Medicine

## 2018-07-10 DIAGNOSIS — Z711 Person with feared health complaint in whom no diagnosis is made: Secondary | ICD-10-CM

## 2018-07-10 NOTE — ED Provider Notes (Signed)
[facility] EMERGENCY DEPARTMENT Provider Note   CSN: [id number] Arrival date & time: 07/10/18  1234    History   Chief Complaint No chief complaint on file.   HPI Danielle Reilly is a 43 y.o. female.     The history is provided by the patient. No language interpreter was used.  Cough     43 year old female presenting for evaluation of a cough.  Patient works for [employer]. Due to recent Covid-19 pandemic, each employee has to fill a health survey.  Pt sts she has been working there for approximately 3 months.  She endorse occasional non productive coughs throughout the duration, which is not new.  She also report her boyfriend lives in [place] and currently visiting her.  He does not have any flu like symptoms.  However, due to potential risk of COVID infection, her work place request patient to be seen in the ER to obtain work note.  Patient otherwise denies fever, no other URI symptoms.  No recent sick contact with anybody that has positive for covid-19 she denies any recent travel.  Past Medical History:  Diagnosis Date  . Anemia   . GERD (gastroesophageal reflux disease)    tums as needed  . SVD (spontaneous vaginal delivery)    x 1    Patient Active Problem List   Diagnosis Date Noted  . Postoperative state 06/19/2013  . Gingivitis, acute 01/29/2013  . Toothache 01/29/2013  . Abnormal uterine bleeding 11/29/2012  . Hemorrhagic ovarian cyst 12/26/2010  . Hemoperitoneum 12/25/2010    Past Surgical History:  Procedure Laterality Date  . BILATERAL SALPINGECTOMY N/A 06/19/2013   Procedure: BILATERAL SALPINGECTOMY;  Surgeon: Willodean Rosenthal, MD;  [facility]: [facility];  Service: Gynecology;  Laterality: N/A;  . DILITATION & CURRETTAGE/HYSTROSCOPY WITH NOVASURE ABLATION N/A 04/04/2013   Procedure: DILATATION & CURETTAGE/HYSTEROSCOPY WITH Attempted NOVASURE ABLATION ;  Surgeon: Willodean Rosenthal, MD;  Location: [facility];  Service: Gynecology;   Laterality: N/A;  . HYSTEROSCOPY N/A 05/26/2013   Procedure: UNSUCCESSFUL HYSTEROSCOPY WITH HYDROTHERMAL ABLATION;  Surgeon: Willodean Rosenthal, MD;  Location: [facility];  Service: Gynecology;  Laterality: N/A;  . LAPAROSCOPY  12/25/2010   Procedure: LAPAROSCOPY OPERATIVE;  Surgeon: Roseanna Rainbow, MD;  Location: [facility];  Service: Gynecology;  Laterality: N/A;  Operative laparoscopy /cauterization of right hemorrhagic cyst wall. Removal of belly rings.  Marland Kitchen LAPAROSCOPY FOR ECTOPIC PREGNANCY    . NOVASURE ABLATION N/A 05/26/2013   Procedure: UNSUCCESSFUL NOVASURE ABLATION;  Surgeon: Willodean Rosenthal, MD;  [facility]: [facility];  Service: Gynecology;  Laterality: N/A;  . uterine biopsy    . VAGINAL HYSTERECTOMY N/A 06/19/2013   Procedure:  TOTAL VAGINAL HYSTERECTOMY with bilateral salpingectomy;  Surgeon: Willodean Rosenthal, MD;  Location: [facility];  Service: Gynecology;  Laterality: N/A;     OB History    Gravida  4   Para  1   Term  1   Preterm      AB  3   Living  1     SAB  1   TAB  2   Ectopic  0   Multiple      Live Births  1            Home Medications    Prior to Admission medications   Medication Sig Start Date End Date Taking? Authorizing Provider  chlordiazePOXIDE (LIBRIUM) 25 MG capsule 50mg  PO TID x 1D, then 25-50mg  PO BID X 1D, then 25-50mg  PO QD X 1D 06/23/18  Khatri, Hina, PA-C  famotidine (PEPCID) 20 MG tablet Take 1 tablet (20 mg total) by mouth 2 (two) times daily for 5 days. 11/25/17 02/06/18  Joy, Shawn C, PA-C  ibuprofen (ADVIL,MOTRIN) 800 MG tablet Take 1 tablet (800 mg total) by mouth 3 (three) times daily. 02/06/18   Shirlee Latch, PA-C  Multiple Vitamin (MULTIVITAMIN WITH MINERALS) TABS tablet Take 1 tablet by mouth daily.    [provider]  omeprazole (PRILOSEC) 20 MG capsule Take 1 capsule (20 mg total) by mouth daily. 05/24/17   Bethel Born, PA-C  sucralfate (CARAFATE) 1 GM/10ML suspension Take 10 mLs (1 g total) by mouth 4  (four) times daily -  with meals and at bedtime. 11/25/17   Joy, Hillard Danker, PA-C    Family History Family History  Problem Relation Age of Onset  . Drug abuse Mother   . Arthritis Maternal Aunt   . Cancer Maternal Aunt   . Drug abuse Maternal Aunt   . Arthritis Maternal Uncle   . Alcohol abuse Maternal Uncle   . Cancer Maternal Uncle   . Drug abuse Maternal Uncle   . Cancer Maternal Grandmother   . Alcohol abuse Maternal Grandfather   . Cancer Maternal Grandfather     Social History Social History   Tobacco Use  . Smoking status: Never Smoker  . Smokeless tobacco: Never Used  Substance Use Topics  . Alcohol use: Yes    Comment: 1 pint of liquor/day  . Drug use: No     Allergies   Patient has no known allergies.   Review of Systems Review of Systems  Respiratory: Positive for cough.   All other systems reviewed and are negative.    Physical Exam Updated Vital Signs LMP 06/19/2013   Physical Exam Vitals signs and nursing note reviewed.  Constitutional:      General: She is not in acute distress.    Appearance: She is well-developed.  HENT:     Head: Atraumatic.     Right Ear: Tympanic membrane normal.     Left Ear: Tympanic membrane normal.     Nose: Nose normal.     Mouth/Throat:     Mouth: Mucous membranes are moist.  Eyes:     Conjunctiva/sclera: Conjunctivae normal.  Neck:     Musculoskeletal: Neck supple.  Cardiovascular:     Rate and Rhythm: Normal rate and regular rhythm.     Pulses: Normal pulses.     Heart sounds: Normal heart sounds.  Pulmonary:     Effort: Pulmonary effort is normal.     Breath sounds: Normal breath sounds. No wheezing, rhonchi or rales.  Abdominal:     General: Abdomen is flat.  Skin:    Findings: No rash.  Neurological:     Mental Status: She is alert.      ED Treatments / Results  Labs (all labs ordered are listed, but only abnormal results are displayed) Labs Reviewed - No data to display  EKG None   Radiology No results found.  Procedures Procedures (including critical care time)  Medications Ordered in ED Medications - No data to display   Initial Impression / Assessment and Plan / ED Course  I have reviewed the triage vital signs and the nursing notes.  Pertinent labs & imaging results that were available during my care of the patient were reviewed by me and considered in my medical decision making (see chart for details).        BP  109/88 (BP Location: Right Arm)   Pulse 95   Temp 98.6 F (37 C) (Oral)   Resp 18   Ht 5\' 5"  (1.651 m)   LMP 06/19/2013   SpO2 100%   BMI 26.63 kg/m    Final Clinical Impressions(s) / ED Diagnoses   Final diagnoses:  Worried well    ED Discharge Orders    None     12:48 PM Pt here requesting work note to return to work due to having a non productive cough.  Very low suspicion of COVID-19 infection.  Work note provided.     Fayrene Helper, PA-C 07/10/18 1254    Sabas Sous, MD 07/10/18 [id number]

## 2018-07-10 NOTE — Discharge Instructions (Addendum)
You may furnish the work note to your employer.

## 2018-07-10 NOTE — ED Notes (Signed)
Patient verbalizes understanding of discharge instructions . Opportunity for questions and answers were provided . Armband removed by staff ,Pt discharged from ED. W/C  offered at D/C  and Declined W/C at D/C and was escorted to lobby by RN.  

## 2018-07-10 NOTE — ED Triage Notes (Signed)
PT sent by employer because of cough at work.  Pt reports she has also been with boyfriend  Who lives in [place]

## 2018-07-27 ENCOUNTER — Encounter (HOSPITAL_COMMUNITY): Payer: Self-pay

## 2018-07-27 ENCOUNTER — Emergency Department (HOSPITAL_COMMUNITY): Payer: 59

## 2018-07-27 ENCOUNTER — Other Ambulatory Visit: Payer: Self-pay

## 2018-07-27 ENCOUNTER — Emergency Department (HOSPITAL_COMMUNITY)
Admission: EM | Admit: 2018-07-27 | Discharge: 2018-07-27 | Disposition: A | Discharge status: Home / Self Care | Payer: 59 | Attending: Emergency Medicine | Admitting: Emergency Medicine

## 2018-07-27 DIAGNOSIS — R000 Tachycardia, unspecified: Secondary | ICD-10-CM | POA: Diagnosis not present

## 2018-07-27 DIAGNOSIS — R0602 Shortness of breath: Secondary | ICD-10-CM

## 2018-07-27 DIAGNOSIS — K760 Fatty (change of) liver, not elsewhere classified: Secondary | ICD-10-CM | POA: Insufficient documentation

## 2018-07-27 DIAGNOSIS — Z79899 Other long term (current) drug therapy: Secondary | ICD-10-CM | POA: Insufficient documentation

## 2018-07-27 DIAGNOSIS — F10930 Alcohol use, unspecified with withdrawal, uncomplicated: Secondary | ICD-10-CM

## 2018-07-27 DIAGNOSIS — R197 Diarrhea, unspecified: Secondary | ICD-10-CM | POA: Diagnosis not present

## 2018-07-27 DIAGNOSIS — F10230 Alcohol dependence with withdrawal, uncomplicated: Secondary | ICD-10-CM | POA: Diagnosis not present

## 2018-07-27 LAB — CBC WITH DIFFERENTIAL/PLATELET
Abs Immature Granulocytes: 0.02 10*3/uL (ref 0.00–0.07)
Basophils Absolute: 0 10*3/uL (ref 0.0–0.1)
Basophils Relative: 1 %
Eosinophils Absolute: 0 10*3/uL (ref 0.0–0.5)
Eosinophils Relative: 0 %
HCT: 37.2 % (ref 36.0–46.0)
Hemoglobin: 12.3 g/dL (ref 12.0–15.0)
Immature Granulocytes: 0 %
Lymphocytes Relative: 15 %
Lymphs Abs: 1.1 10*3/uL (ref 0.7–4.0)
MCH: 30 pg (ref 26.0–34.0)
MCHC: 33.1 g/dL (ref 30.0–36.0)
MCV: 90.7 fL (ref 80.0–100.0)
Monocytes Absolute: 0.4 10*3/uL (ref 0.1–1.0)
Monocytes Relative: 5 %
Neutro Abs: 6.1 10*3/uL (ref 1.7–7.7)
Neutrophils Relative %: 79 %
Platelets: 166 10*3/uL (ref 150–400)
RBC: 4.1 MIL/uL (ref 3.87–5.11)
RDW: 14 % (ref 11.5–15.5)
WBC: 7.7 10*3/uL (ref 4.0–10.5)
nRBC: 0 % (ref 0.0–0.2)

## 2018-07-27 LAB — COMPREHENSIVE METABOLIC PANEL
ALT: 28 U/L (ref 0–44)
AST: 137 U/L — ABNORMAL HIGH (ref 15–41)
Albumin: 4.2 g/dL (ref 3.5–5.0)
Alkaline Phosphatase: 61 U/L (ref 38–126)
Anion gap: 15 (ref 5–15)
BUN: 9 mg/dL (ref 6–20)
CO2: 18 mmol/L — ABNORMAL LOW (ref 22–32)
Calcium: 8.8 mg/dL — ABNORMAL LOW (ref 8.9–10.3)
Chloride: 103 mmol/L (ref 98–111)
Creatinine, Ser: 0.65 mg/dL (ref 0.44–1.00)
GFR calc Af Amer: >60 mL/min (ref >60)
GFR calc non Af Amer: >60 mL/min (ref >60)
Glucose, Bld: 80 mg/dL (ref 70–99)
Potassium: 3.8 mmol/L (ref 3.5–5.1)
Sodium: 136 mmol/L (ref 135–145)
Total Bilirubin: 1.2 mg/dL (ref 0.3–1.2)
Total Protein: 7.7 g/dL (ref 6.5–8.1)

## 2018-07-27 LAB — D-DIMER, QUANTITATIVE: D-Dimer, Quant: 0.57 ug/mL-FEU — ABNORMAL HIGH (ref 0.00–0.50)

## 2018-07-27 LAB — LIPASE, BLOOD: Lipase: 46 U/L (ref 11–51)

## 2018-07-27 MED ORDER — LORAZEPAM 2 MG/ML IJ SOLN
1.0000 mg | Freq: Once | INTRAMUSCULAR | Status: AC
  Administered 2018-07-27: 1 mg via INTRAVENOUS
  Filled 2018-07-27: qty 1

## 2018-07-27 MED ORDER — LORAZEPAM 2 MG/ML IJ SOLN: 0.0000 mg | Freq: Two times a day (BID) | INTRAMUSCULAR | Status: DC

## 2018-07-27 MED ORDER — LORAZEPAM 2 MG/ML IJ SOLN
0.0000 mg | Freq: Four times a day (QID) | INTRAMUSCULAR | Status: DC
  Administered 2018-07-27: 1 mg via INTRAVENOUS
  Filled 2018-07-27: qty 1

## 2018-07-27 MED ORDER — CHLORDIAZEPOXIDE HCL 25 MG PO CAPS: ORAL_CAPSULE | ORAL | 0 refills | Status: DC

## 2018-07-27 MED ORDER — VITAMIN B-1 100 MG PO TABS: 100.0000 mg | ORAL_TABLET | Freq: Every day | ORAL | Status: DC

## 2018-07-27 MED ORDER — SODIUM CHLORIDE 0.9 % IV BOLUS
250.0000 mL | Freq: Once | INTRAVENOUS | Status: AC
  Administered 2018-07-27: 15:00:00 250 mL via INTRAVENOUS

## 2018-07-27 MED ORDER — SODIUM CHLORIDE (PF) 0.9 % IJ SOLN
INTRAMUSCULAR | Status: AC
  Filled 2018-07-27: qty 50

## 2018-07-27 MED ORDER — LORAZEPAM 1 MG PO TABS: 0.0000 mg | ORAL_TABLET | Freq: Two times a day (BID) | ORAL | Status: DC

## 2018-07-27 MED ORDER — THIAMINE HCL 100 MG/ML IJ SOLN
100.0000 mg | Freq: Every day | INTRAMUSCULAR | Status: DC
  Administered 2018-07-27: 100 mg via INTRAVENOUS
  Filled 2018-07-27: qty 2

## 2018-07-27 MED ORDER — IOHEXOL 350 MG/ML SOLN
100.0000 mL | Freq: Once | INTRAVENOUS | Status: AC | PRN
  Administered 2018-07-27: 100 mL via INTRAVENOUS

## 2018-07-27 MED ORDER — SODIUM CHLORIDE 0.9 % IV BOLUS
250.0000 mL | Freq: Once | INTRAVENOUS | Status: AC
  Administered 2018-07-27: 250 mL via INTRAVENOUS

## 2018-07-27 MED ORDER — LORAZEPAM 1 MG PO TABS: 0.0000 mg | ORAL_TABLET | Freq: Four times a day (QID) | ORAL | Status: DC

## 2018-07-27 MED ORDER — SODIUM CHLORIDE 0.9 % IV BOLUS
1000.0000 mL | Freq: Once | INTRAVENOUS | Status: AC
  Administered 2018-07-27: 16:00:00 1000 mL via INTRAVENOUS

## 2018-07-27 NOTE — ED Notes (Signed)
Pt resting comfortably, currently on her phone.

## 2018-07-27 NOTE — ED Provider Notes (Addendum)
3:25 PM handoff from Law PA-C at shift change.  Patient with history of alcohol abuse presenting today with shortness of breath.  Work-up to this point has been reassuring.  Chest x-ray was negative.  Normal white blood cell count and leukocyte count.  No known COVID-19 contacts.  Patient remains mildly tachycardic despite administration of IV fluids.  Patient does have a driving history back and forth to [place] recently.  For this reason, d-dimer added on and is currently pending.  Patient without clinical signs or symptoms of DVT.  May be an element of EtOH withdrawal.   BP 124/83   Pulse (!) 107   Temp 97.9 F (36.6 C) (Oral)   Resp 18   LMP 06/19/2013   SpO2 100%    EKG Interpretation  Date/Time:  Saturday July 27 2018 12:52:04 EDT Ventricular Rate:  117 PR Interval:    QRS Duration: 71 QT Interval:  316 QTC Calculation: 441 R Axis:   76 Text Interpretation:  a Sinus tachycardia Ventricular premature complex Aberrant conduction of SV complex(es) Probable left atrial enlargement Minimal ST depression, lateral leads Baseline wander in lead(s) II III aVR aVL aVF V5 Sinus tachycardia.  When compared to prior, faster rate, less PVC>  No STEMI Confirmed by Theda Belfast ([id number]) on 07/27/2018 1:16:27 PM      3:53 PM D-dimer slightly elevated at 0.57. CT angiography ordered. Pt s/p hysterectomy.   6:27 PM CT images personally reviewed and interpreted.    Patient updated on results --including changes of fatty liver that she states that she is not aware of.  On reexam, she continues to be tachycardic despite fluids.  She is in no respiratory distress.  She is tremulous.  Her heart rate was actually the best after her first dose of Ativan.  I suspect that she has an element of alcohol withdrawal and she agrees.  We will give additional dose of Ativan prior to discharge.  Patient is comfortable discharged home.  Will give a tapered course of Librium.  Discussed that she should not be  drinking alcohol if she is taking this medication as it can cause respiratory depression.  Strongly encourage primary care follow-up.   Encouraged return to emergency department with worsening symptoms, fever, shortness of breath, cough, chest pain, new symptoms or other concerns.  Patient verbalizes understanding and agrees with plan.  BP 121/76   Pulse (!) 117   Temp 97.9 F (36.6 C) (Oral)   Resp (!) 23   LMP 06/19/2013   SpO2 100%       Renne Crigler, PA-C 07/27/18 Marcial Pacas, MD 07/27/18 2337

## 2018-07-27 NOTE — Discharge Instructions (Signed)
Please read and follow all provided instructions.  Your diagnoses today include:  1. Diarrhea, unspecified type   2. Shortness of breath   3. Alcohol withdrawal syndrome without complication (HCC)   4. Fatty liver   5. Tachycardia     Tests performed today include:  Blood counts and electrolytes -look normal  Liver function test-slightly elevated  Screening test for blood clot -was slightly elevated  CAT scan of your chest -no blood clots or other problems with the lungs, shows fatty liver  Vital signs. See below for your results today.   Medications prescribed:   Librium - medication to help curb alcohol withdrawal.  Do not take if you are drinking alcohol.  Combining this medication with alcohol can cause breathing problems and can cause you to stop breathing.  Take any prescribed medications only as directed.  Home care instructions:  Follow any educational materials contained in this packet.  BE VERY CAREFUL not to take multiple medicines containing Tylenol (also called acetaminophen). Doing so can lead to an overdose which can damage your liver and cause liver failure and possibly death.   Follow-up instructions: Please follow-up with your primary care provider in the next 3 days for further evaluation of your symptoms.   Return instructions:   Please return to the Emergency Department if you experience worsening symptoms.   Please return if you have any other emergent concerns.  Additional Information:  Your vital signs today were: BP 121/76    Pulse (!) 117    Temp 97.9 F (36.6 C) (Oral)    Resp (!) 23    LMP 06/19/2013    SpO2 100%  If your blood pressure (BP) was elevated above 135/85 this visit, please have this repeated by your doctor within one month. --------------

## 2018-07-27 NOTE — ED Provider Notes (Signed)
[facility]-EMERGENCY DEPT Provider Note   CSN: [id number] Arrival date & time: 07/27/18  1237    History   Chief Complaint Chief Complaint  Patient presents with   Shortness of Breath   Diarrhea    HPI Danielle Reilly is a 43 y.o. female with history of GERD, alcohol abuse who presents with a 5-day history of shortness of breath and diarrhea.  Patient reports she has had some nasal congestion and mild intermittent cough.  She denies any sore throat, chest pain, abdominal pain.  Her shortness of breath worsens on exertion.  She denies any documented fever at home, however states she has felt warm at times.  She has been around her boyfriend who is from [place], however he has not had any similar symptoms.  Patient reports she is an alcoholic and has been trying to cut back.  She reports she normally drinks a bottle of liquor a day, but has been trying to make the bottles last longer and has been switching to wine to try and wean herself off.  She thinks she might be going through withdrawals.  She has had some mild headache, but she denies any nausea or vomiting.  Patient reports she drives to [place] and back a lot.  She denies any known cancer, recent surgeries, new leg pain or swelling, history of blood clots, exogenous estrogen use     HPI  Past Medical History:  Diagnosis Date   Anemia    GERD (gastroesophageal reflux disease)    tums as needed   SVD (spontaneous vaginal delivery)    x 1    Patient Active Problem List   Diagnosis Date Noted   Postoperative state 06/19/2013   Gingivitis, acute 01/29/2013   Toothache 01/29/2013   Abnormal uterine bleeding 11/29/2012   Hemorrhagic ovarian cyst 12/26/2010   Hemoperitoneum 12/25/2010    Past Surgical History:  Procedure Laterality Date   BILATERAL SALPINGECTOMY N/A 06/19/2013   Procedure: BILATERAL SALPINGECTOMY;  Surgeon: Willodean Rosenthal, MD;  Location: [facility];  Service: Gynecology;  Laterality:  N/A;   DILITATION & CURRETTAGE/HYSTROSCOPY WITH NOVASURE ABLATION N/A 04/04/2013   Procedure: DILATATION & CURETTAGE/HYSTEROSCOPY WITH Attempted NOVASURE ABLATION ;  Surgeon: Willodean Rosenthal, MD;  Location: [facility];  Service: Gynecology;  Laterality: N/A;   HYSTEROSCOPY N/A 05/26/2013   Procedure: UNSUCCESSFUL HYSTEROSCOPY WITH HYDROTHERMAL ABLATION;  Surgeon: Willodean Rosenthal, MD;  Location: [facility];  Service: Gynecology;  Laterality: N/A;   LAPAROSCOPY  12/25/2010   Procedure: LAPAROSCOPY OPERATIVE;  Surgeon: Roseanna Rainbow, MD;  Location: [facility];  Service: Gynecology;  Laterality: N/A;  Operative laparoscopy /cauterization of right hemorrhagic cyst wall. Removal of belly rings.   LAPAROSCOPY FOR ECTOPIC PREGNANCY     NOVASURE ABLATION N/A 05/26/2013   Procedure: UNSUCCESSFUL NOVASURE ABLATION;  Surgeon: Willodean Rosenthal, MD;  Location: [facility];  Service: Gynecology;  Laterality: N/A;   uterine biopsy     VAGINAL HYSTERECTOMY N/A 06/19/2013   Procedure:  TOTAL VAGINAL HYSTERECTOMY with bilateral salpingectomy;  Surgeon: Willodean Rosenthal, MD;  Location: [facility];  Service: Gynecology;  Laterality: N/A;     OB History    Gravida  4   Para  1   Term  1   Preterm      AB  3   Living  1     SAB  1   TAB  2   Ectopic  0   Multiple      Live Births  1  Home Medications    Prior to Admission medications   Medication Sig Start Date End Date Taking? Authorizing Provider  B Complex Vitamins (B COMPLEX 1 PO) Take by mouth.   Yes [provider]  famotidine (PEPCID) 20 MG tablet Take 1 tablet (20 mg total) by mouth 2 (two) times daily for 5 days. Patient taking differently: Take 20 mg by mouth 2 (two) times daily as needed for heartburn or indigestion.  11/25/17 07/27/18 Yes Joy, Shawn C, PA-C  Multiple Vitamin (MULTIVITAMIN WITH MINERALS) TABS tablet Take 1 tablet by mouth daily.   Yes [provider]  omeprazole (PRILOSEC)  20 MG capsule Take 1 capsule (20 mg total) by mouth daily. Patient taking differently: Take 20 mg by mouth daily as needed (For heartburn or acid reflux.).  05/24/17  Yes Bethel BornGekas, Kelly Marie, PA-C  tetrahydrozoline 0.05 % ophthalmic solution Place 1-2 drops into both eyes 4 (four) times daily as needed (For eye irritation.).   Yes [provider]    Family History Family History  Problem Relation Age of Onset   Drug abuse Mother    Arthritis Maternal Aunt    Cancer Maternal Aunt    Drug abuse Maternal Aunt    Arthritis Maternal Uncle    Alcohol abuse Maternal Uncle    Cancer Maternal Uncle    Drug abuse Maternal Uncle    Cancer Maternal Grandmother    Alcohol abuse Maternal Grandfather    Cancer Maternal Grandfather     Social History Social History   Tobacco Use   Smoking status: Never Smoker   Smokeless tobacco: Never Used  Substance Use Topics   Alcohol use: Yes    Comment: 1 pint of liquor/day   Drug use: No     Allergies   Patient has no known allergies.   Review of Systems Review of Systems  Constitutional: Negative for chills and fever (feels warm at times).  HENT: Negative for facial swelling and sore throat.   Respiratory: Positive for cough and shortness of breath.   Cardiovascular: Negative for chest pain.  Gastrointestinal: Positive for diarrhea. Negative for abdominal pain, blood in stool, nausea and vomiting.  Genitourinary: Negative for dysuria.  Musculoskeletal: Negative for back pain.  Skin: Negative for rash and wound.  Neurological: Positive for tremors and headaches.  Psychiatric/Behavioral: The patient is not nervous/anxious.      Physical Exam Updated Vital Signs BP 124/83    Pulse (!) 107    Temp 97.9 F (36.6 C) (Oral)    Resp 18    LMP 06/19/2013    SpO2 100%   Physical Exam Vitals signs and nursing note reviewed.  Constitutional:      General: She is not in acute distress.    Appearance: She is  well-developed. She is not diaphoretic.  HENT:     Head: Normocephalic and atraumatic.     Mouth/Throat:     Pharynx: No oropharyngeal exudate.  Eyes:     General: No scleral icterus.       Right eye: No discharge.        Left eye: No discharge.     Conjunctiva/sclera: Conjunctivae normal.     Pupils: Pupils are equal, round, and reactive to light.  Neck:     Musculoskeletal: Normal range of motion and neck supple.     Thyroid: No thyromegaly.  Cardiovascular:     Rate and Rhythm: Regular rhythm. Tachycardia present.     Heart sounds: Normal heart sounds. No murmur.  No friction rub. No gallop.   Pulmonary:     Effort: Pulmonary effort is normal. No tachypnea or respiratory distress.     Breath sounds: Normal breath sounds. No stridor. No wheezing or rales.  Abdominal:     General: Bowel sounds are normal. There is no distension.     Palpations: Abdomen is soft.     Tenderness: There is no abdominal tenderness. There is no right CVA tenderness, left CVA tenderness, guarding or rebound.  Lymphadenopathy:     Cervical: No cervical adenopathy.  Skin:    General: Skin is warm and dry.     Coloration: Skin is not pale.     Findings: No rash.  Neurological:     Mental Status: She is alert.     Coordination: Coordination normal.     Comments: Intermittent tremor      ED Treatments / Results  Labs (all labs ordered are listed, but only abnormal results are displayed) Labs Reviewed  COMPREHENSIVE METABOLIC PANEL - Abnormal; Notable for the following components:      Result Value   CO2 18 (*)    Calcium 8.8 (*)    AST 137 (*)    All other components within normal limits  LIPASE, BLOOD  CBC WITH DIFFERENTIAL/PLATELET  URINALYSIS, ROUTINE W REFLEX MICROSCOPIC  D-DIMER, QUANTITATIVE (NOT AT [facility])    EKG EKG Interpretation  Date/Time:  Saturday July 27 2018 12:52:04 EDT Ventricular Rate:  117 PR Interval:    QRS Duration: 71 QT Interval:  316 QTC Calculation: 441 R  Axis:   76 Text Interpretation:  a Sinus tachycardia Ventricular premature complex Aberrant conduction of SV complex(es) Probable left atrial enlargement Minimal ST depression, lateral leads Baseline wander in lead(s) II III aVR aVL aVF V5 Sinus tachycardia.  When compared to prior, faster rate, less PVC>  No STEMI Confirmed by Theda Belfast ([id number]) on 07/27/2018 1:16:27 PM   Radiology Dg Chest Portable 1 View  Result Date: 07/27/2018 CLINICAL DATA:  Four-day history of shortness of breath, minimal cough and diarrhea. EXAM: PORTABLE CHEST 1 VIEW COMPARISON:  10/21/2015, 10/09/2008. FINDINGS: AP ERECT image was obtained. Cardiac silhouette and mediastinal contours normal in appearance for the AP portable technique. Pulmonary parenchyma clear. Bronchovascular markings normal. Pulmonary vascularity normal. No pneumothorax. No visible pleural effusions. No interval change. BILATERAL nipple bars again noted. IMPRESSION: No acute cardiopulmonary disease. Stable examination. Electronically Signed   By: Hulan Saas M.D.   On: 07/27/2018 15:04    Procedures Procedures (including critical care time)  Medications Ordered in ED Medications  LORazepam (ATIVAN) injection 0-4 mg (1 mg Intravenous Given 07/27/18 1418)    Or  LORazepam (ATIVAN) tablet 0-4 mg ( Oral See Alternative 07/27/18 1418)  LORazepam (ATIVAN) injection 0-4 mg (has no administration in time range)    Or  LORazepam (ATIVAN) tablet 0-4 mg (has no administration in time range)  thiamine (VITAMIN B-1) tablet 100 mg ( Oral See Alternative 07/27/18 1418)    Or  thiamine (B-1) injection 100 mg (100 mg Intravenous Given 07/27/18 1418)  sodium chloride 0.9 % bolus 250 mL (0 mLs Intravenous Stopped 07/27/18 1453)  sodium chloride 0.9 % bolus 250 mL (250 mLs Intravenous Bolus from Bag 07/27/18 1453)     Initial Impression / Assessment and Plan / ED Course  I have reviewed the triage vital signs and the nursing notes.  Pertinent labs &  imaging results that were available during my care of the patient were reviewed by me and  considered in my medical decision making (see chart for details).        Patient presenting with a 5-day history of diarrhea and shortness of breath. She was found to be tachycardic. Possible component of alcohol withdrawal.  Her labs are unremarkable except for mild elevation in AST, 137 and CO2 18, which I related to alcohol use.  UA is pending.  Chest x-ray is clear.  EKG shows tachycardia.  Considering after 500 mL fluid bolus and Ativan per CIWA, patient is still tachycardic and d-dimer added for assessment of PE as patient is probably low risk for this.  Also consider COVID-19, as patient has had subjective fever and feeling warm at home as well as intermittent cough and shortness of breath.  At shift change, patient care transferred to [facility], PA-C, for follow-up of UA and d-dimer.  Anticipate discharge home if these are negative.  Danielle Reilly was evaluated in Emergency Department on 07/27/2018 for the symptoms described in the history of present illness. She was evaluated in the context of the global COVID-19 pandemic, which necessitated consideration that the patient might be at risk for infection with the SARS-CoV-2 virus that causes COVID-19. Institutional protocols and algorithms that pertain to the evaluation of patients at risk for COVID-19 are in a state of rapid change based on information released by regulatory bodies including the CDC and federal and state organizations. These policies and algorithms were followed during the patient's care in the ED.  Final Clinical Impressions(s) / ED Diagnoses   Final diagnoses:  Diarrhea, unspecified type  Shortness of breath    ED Discharge Orders    None       Emi Holes, PA-C 07/27/18 1520    Tegeler, Canary Brim, MD 07/27/18 1525

## 2018-07-27 NOTE — ED Notes (Signed)
Pt adds that she was recently in [place] for couple days.

## 2018-07-27 NOTE — ED Notes (Signed)
Bed: [facility]18 Expected date:  Expected time:  Means of arrival:  Comments: Neg pressure 

## 2018-07-27 NOTE — ED Notes (Signed)
ED Provider at bedside. 

## 2018-07-27 NOTE — ED Notes (Signed)
Patient transported to CT 

## 2018-07-27 NOTE — ED Triage Notes (Signed)
Pt reports been hard to breath, diarrhea since Tuesday, sleeping a lot. Reports boyfriend works in [place].

## 2018-10-24 ENCOUNTER — Emergency Department (HOSPITAL_COMMUNITY)
Admission: EM | Admit: 2018-10-24 | Discharge: 2018-10-25 | Disposition: A | Discharge status: Home / Self Care | Payer: Self-pay | Attending: Emergency Medicine | Admitting: Emergency Medicine

## 2018-10-24 ENCOUNTER — Other Ambulatory Visit: Payer: Self-pay

## 2018-10-24 ENCOUNTER — Encounter (HOSPITAL_COMMUNITY): Payer: Self-pay

## 2018-10-24 DIAGNOSIS — I4589 Other specified conduction disorders: Secondary | ICD-10-CM | POA: Insufficient documentation

## 2018-10-24 DIAGNOSIS — R9431 Abnormal electrocardiogram [ECG] [EKG]: Secondary | ICD-10-CM

## 2018-10-24 DIAGNOSIS — F4323 Adjustment disorder with mixed anxiety and depressed mood: Secondary | ICD-10-CM | POA: Insufficient documentation

## 2018-10-24 DIAGNOSIS — F1010 Alcohol abuse, uncomplicated: Secondary | ICD-10-CM | POA: Insufficient documentation

## 2018-10-24 DIAGNOSIS — F419 Anxiety disorder, unspecified: Secondary | ICD-10-CM

## 2018-10-24 DIAGNOSIS — Z20828 Contact with and (suspected) exposure to other viral communicable diseases: Secondary | ICD-10-CM | POA: Insufficient documentation

## 2018-10-24 NOTE — ED Triage Notes (Signed)
Pt arrived with complaints of shortness of breath, generalized weakness and loss of appetite for four days. Reports her family is having similar symptoms.

## 2018-10-25 ENCOUNTER — Emergency Department (HOSPITAL_COMMUNITY): Payer: Self-pay

## 2018-10-25 LAB — CBC WITH DIFFERENTIAL/PLATELET
Abs Immature Granulocytes: 0.04 10*3/uL (ref 0.00–0.07)
Basophils Absolute: 0.1 10*3/uL (ref 0.0–0.1)
Basophils Relative: 1 %
Eosinophils Absolute: 0 10*3/uL (ref 0.0–0.5)
Eosinophils Relative: 0 %
HCT: 39 % (ref 36.0–46.0)
Hemoglobin: 12.6 g/dL (ref 12.0–15.0)
Immature Granulocytes: 1 %
Lymphocytes Relative: 28 %
Lymphs Abs: 1.9 10*3/uL (ref 0.7–4.0)
MCH: 30.1 pg (ref 26.0–34.0)
MCHC: 32.3 g/dL (ref 30.0–36.0)
MCV: 93.1 fL (ref 80.0–100.0)
Monocytes Absolute: 0.6 10*3/uL (ref 0.1–1.0)
Monocytes Relative: 9 %
Neutro Abs: 4.3 10*3/uL (ref 1.7–7.7)
Neutrophils Relative %: 61 %
Platelets: 279 10*3/uL (ref 150–400)
RBC: 4.19 MIL/uL (ref 3.87–5.11)
RDW: 13.5 % (ref 11.5–15.5)
WBC: 6.9 10*3/uL (ref 4.0–10.5)
nRBC: 0 % (ref 0.0–0.2)

## 2018-10-25 LAB — URINALYSIS, ROUTINE W REFLEX MICROSCOPIC
Bacteria, UA: NONE SEEN
Bilirubin Urine: NEGATIVE
Glucose, UA: NEGATIVE mg/dL
Ketones, ur: NEGATIVE mg/dL
Leukocytes,Ua: NEGATIVE
Nitrite: NEGATIVE
Protein, ur: NEGATIVE mg/dL
Specific Gravity, Urine: 1.003 — ABNORMAL LOW (ref 1.005–1.030)
pH: 5 (ref 5.0–8.0)

## 2018-10-25 LAB — COMPREHENSIVE METABOLIC PANEL
ALT: 113 U/L — ABNORMAL HIGH (ref 0–44)
AST: 177 U/L — ABNORMAL HIGH (ref 15–41)
Albumin: 4.8 g/dL (ref 3.5–5.0)
Alkaline Phosphatase: 60 U/L (ref 38–126)
Anion gap: 16 — ABNORMAL HIGH (ref 5–15)
BUN: 8 mg/dL (ref 6–20)
CO2: 19 mmol/L — ABNORMAL LOW (ref 22–32)
Calcium: 9.6 mg/dL (ref 8.9–10.3)
Chloride: 100 mmol/L (ref 98–111)
Creatinine, Ser: 0.79 mg/dL (ref 0.44–1.00)
GFR calc Af Amer: >60 mL/min (ref >60)
GFR calc non Af Amer: >60 mL/min (ref >60)
Glucose, Bld: 106 mg/dL — ABNORMAL HIGH (ref 70–99)
Potassium: 3.8 mmol/L (ref 3.5–5.1)
Sodium: 135 mmol/L (ref 135–145)
Total Bilirubin: 1.7 mg/dL — ABNORMAL HIGH (ref 0.3–1.2)
Total Protein: 8.2 g/dL — ABNORMAL HIGH (ref 6.5–8.1)

## 2018-10-25 LAB — D-DIMER, QUANTITATIVE: D-Dimer, Quant: <0.27 ug/mL-FEU (ref 0.00–0.50)

## 2018-10-25 MED ORDER — SODIUM CHLORIDE 0.9 % IV BOLUS
1000.0000 mL | Freq: Once | INTRAVENOUS | Status: AC
  Administered 2018-10-25: 1000 mL via INTRAVENOUS

## 2018-10-25 MED ORDER — LORAZEPAM 2 MG/ML IJ SOLN
1.0000 mg | Freq: Once | INTRAMUSCULAR | Status: AC
  Administered 2018-10-25: 1 mg via INTRAVENOUS
  Filled 2018-10-25: qty 1

## 2018-10-25 MED ORDER — CHLORDIAZEPOXIDE HCL 25 MG PO CAPS: ORAL_CAPSULE | ORAL | 0 refills | Status: DC

## 2018-10-25 NOTE — Discharge Instructions (Addendum)
Use the Librium taper, as prescribed, to help lessen the effects of alcohol withdrawal.  Do not drink alcohol and take the Librium taper at the same time. Follow-up with your primary care provider soon as possible to help support you if you are ready to stop drinking alcohol.  This should be done in a safe environment with proper supervision.

## 2018-10-25 NOTE — ED Notes (Signed)
Pt reports SHOB, chest pain, abd pain, and loss of appetite. Pt reports drinking 1 bottle of wine or champagne a day. Pt drank 1 glass of red wine today.

## 2018-10-25 NOTE — ED Provider Notes (Signed)
[facility]-EMERGENCY DEPT Provider Note   CSN: [id number] Arrival date & time: 10/24/18  [id number]    History   Chief Complaint Chief Complaint  Patient presents with  . Shortness of Breath    HPI Dorothey BasemanChaka Herschberger is a 43 y.o. female.     HPI   Dorothey BasemanChaka Duffner is a 43 y.o. female, with a history of anemia, hysterectomy, and GERD, presenting to the ED with shortness of breath beginning around 7/6. Accompanied by moderate, constant pain in the left upper chest, sharp/tight, nonradiating. Worse with deep breathing. SOB worse with exertion.  Thought it was anxiety due to family stress, but has not felt this before. Went to see a Veterinary surgeoncounselor, but wants to make sure it's not something else.  She adds, "It might be withdrawal.  I have had that before and it feels similar." Drinks a bottle of champagne a day with last drink 11 AM 7/9.  She notes she has been trying to lessen her alcohol intake beginning around July 4 due to family being in town, and has lessened it even more over the last couple days, which has made her feel worse. Denies illicit drug use.  Denies history of PE/DVT, recent surgery, recent immobilization, recent trauma. Denies fever/chills, N/V/D, cough, abdominal pain, dizziness, syncope, diaphoresis, neuro deficits, lower extremity pain/swelling, or any other complaints.    Past Medical History:  Diagnosis Date  . Anemia   . GERD (gastroesophageal reflux disease)    tums as needed  . SVD (spontaneous vaginal delivery)    x 1    Patient Active Problem List   Diagnosis Date Noted  . Postoperative state 06/19/2013  . Gingivitis, acute 01/29/2013  . Toothache 01/29/2013  . Abnormal uterine bleeding 11/29/2012  . Hemorrhagic ovarian cyst 12/26/2010  . Hemoperitoneum 12/25/2010    Past Surgical History:  Procedure Laterality Date  . BILATERAL SALPINGECTOMY N/A 06/19/2013   Procedure: BILATERAL SALPINGECTOMY;  Surgeon: Willodean Rosenthalarolyn Harraway-Smith, MD;  Location: [facility];  Service: Gynecology;  Laterality: N/A;  . DILITATION & CURRETTAGE/HYSTROSCOPY WITH NOVASURE ABLATION N/A 04/04/2013   Procedure: DILATATION & CURETTAGE/HYSTEROSCOPY WITH Attempted NOVASURE ABLATION ;  Surgeon: Willodean Rosenthalarolyn Harraway-Smith, MD;  Location: [facility];  Service: Gynecology;  Laterality: N/A;  . HYSTEROSCOPY N/A 05/26/2013   Procedure: UNSUCCESSFUL HYSTEROSCOPY WITH HYDROTHERMAL ABLATION;  Surgeon: Willodean Rosenthalarolyn Harraway-Smith, MD;  Location: [facility];  Service: Gynecology;  Laterality: N/A;  . LAPAROSCOPY  12/25/2010   Procedure: LAPAROSCOPY OPERATIVE;  Surgeon: Roseanna RainbowLisa A Jackson-Moore, MD;  Location: [facility];  Service: Gynecology;  Laterality: N/A;  Operative laparoscopy /cauterization of right hemorrhagic cyst wall. Removal of belly rings.  Marland Kitchen. LAPAROSCOPY FOR ECTOPIC PREGNANCY    . NOVASURE ABLATION N/A 05/26/2013   Procedure: UNSUCCESSFUL NOVASURE ABLATION;  Surgeon: Willodean Rosenthalarolyn Harraway-Smith, MD;  Location: [facility];  Service: Gynecology;  Laterality: N/A;  . uterine biopsy    . VAGINAL HYSTERECTOMY N/A 06/19/2013   Procedure:  TOTAL VAGINAL HYSTERECTOMY with bilateral salpingectomy;  Surgeon: Willodean Rosenthalarolyn Harraway-Smith, MD;  Location: [facility];  Service: Gynecology;  Laterality: N/A;     OB History    Gravida  4   Para  1   Term  1   Preterm      AB  3   Living  1     SAB  1   TAB  2   Ectopic  0   Multiple      Live Births  1  Home Medications    Prior to Admission medications   Medication Sig Start Date End Date Taking? Authorizing Provider  B Complex Vitamins (B COMPLEX 1 PO) Take by mouth.   Yes [provider]  ferrous sulfate 325 (65 FE) MG tablet Take 325 mg by mouth daily with breakfast.   Yes [provider]  Multiple Vitamin (MULTIVITAMIN WITH MINERALS) TABS tablet Take 1 tablet by mouth daily.   Yes [provider]  tetrahydrozoline 0.05 % ophthalmic solution Place 1-2 drops into both eyes 4 (four) times daily as needed (For eye  irritation.).   Yes [provider]  chlordiazePOXIDE (LIBRIUM) 25 MG capsule 50mg  PO TID x 1D, then 25-50mg  PO BID X 1D, then 25-50mg  PO QD X 1D 10/25/18   Emilly Lavey C, PA-C  famotidine (PEPCID) 20 MG tablet Take 1 tablet (20 mg total) by mouth 2 (two) times daily for 5 days. Patient not taking: Reported on 10/25/2018 11/25/17 07/27/18  Anselm PancoastJoy, Shivaun Bilello C, PA-C  omeprazole (PRILOSEC) 20 MG capsule Take 1 capsule (20 mg total) by mouth daily. Patient not taking: Reported on 10/25/2018 05/24/17   Bethel BornGekas, Kelly Marie, PA-C    Family History Family History  Problem Relation Age of Onset  . Drug abuse Mother   . Arthritis Maternal Aunt   . Cancer Maternal Aunt   . Drug abuse Maternal Aunt   . Arthritis Maternal Uncle   . Alcohol abuse Maternal Uncle   . Cancer Maternal Uncle   . Drug abuse Maternal Uncle   . Cancer Maternal Grandmother   . Alcohol abuse Maternal Grandfather   . Cancer Maternal Grandfather     Social History Social History   Tobacco Use  . Smoking status: Never Smoker  . Smokeless tobacco: Never Used  Substance Use Topics  . Alcohol use: Yes    Comment: 1 pint of liquor/day  . Drug use: No     Allergies   Patient has no known allergies.   Review of Systems Review of Systems  Constitutional: Negative for chills, diaphoresis and fever.  Respiratory: Positive for shortness of breath. Negative for cough.   Cardiovascular: Positive for chest pain. Negative for leg swelling.  Gastrointestinal: Negative for abdominal pain, diarrhea, nausea and vomiting.  Genitourinary: Negative for dysuria, flank pain and hematuria.  Neurological: Negative for dizziness, syncope, weakness, light-headedness, numbness and headaches.  All other systems reviewed and are negative.    Physical Exam Updated Vital Signs BP 133/89   Pulse (!) 107   Temp 98.7 F (37.1 C) (Oral)   Resp 20   LMP 06/19/2013   SpO2 99%   Physical Exam Vitals signs and nursing note reviewed.   Constitutional:      General: She is not in acute distress.    Appearance: She is well-developed. She is not diaphoretic.  HENT:     Head: Normocephalic and atraumatic.     Mouth/Throat:     Mouth: Mucous membranes are moist.     Pharynx: Oropharynx is clear.  Eyes:     Conjunctiva/sclera: Conjunctivae normal.  Neck:     Musculoskeletal: Neck supple.  Cardiovascular:     Rate and Rhythm: Regular rhythm. Tachycardia present.     Pulses: Normal pulses.          Radial pulses are 2+ on the right side and 2+ on the left side.       Posterior tibial pulses are 2+ on the right side and 2+ on the left side.  Heart sounds: Normal heart sounds.     Comments: Tactile temperature in the extremities appropriate and equal bilaterally. Pulmonary:     Effort: Pulmonary effort is normal. No respiratory distress.     Breath sounds: Normal breath sounds.     Comments: No increased work of breathing.  Speaks in full sentences without difficulty. Abdominal:     Palpations: Abdomen is soft.     Tenderness: There is no abdominal tenderness. There is no guarding.  Musculoskeletal:     Right lower leg: No edema.     Left lower leg: No edema.     Comments: Lower extremities without noted swelling, pain, tenderness, color change, or increased warmth.  Lymphadenopathy:     Cervical: No cervical adenopathy.  Skin:    General: Skin is warm and dry.  Neurological:     Mental Status: She is alert and oriented to person, place, and time.     Comments: No tremor  Psychiatric:        Mood and Affect: Mood and affect normal.        Speech: Speech normal.        Behavior: Behavior normal.      ED Treatments / Results  Labs (all labs ordered are listed, but only abnormal results are displayed) Labs Reviewed  COMPREHENSIVE METABOLIC PANEL - Abnormal; Notable for the following components:      Result Value   CO2 19 (*)    Glucose, Bld 106 (*)    Total Protein 8.2 (*)    AST 177 (*)    ALT 113 (*)     Total Bilirubin 1.7 (*)    Anion gap 16 (*)    All other components within normal limits  URINALYSIS, ROUTINE W REFLEX MICROSCOPIC - Abnormal; Notable for the following components:   Color, Urine STRAW (*)    Specific Gravity, Urine 1.003 (*)    Hgb urine dipstick SMALL (*)    All other components within normal limits  NOVEL CORONAVIRUS, NAA (HOSPITAL ORDER, SEND-OUT TO REF LAB)  CBC WITH DIFFERENTIAL/PLATELET  D-DIMER, QUANTITATIVE (NOT AT [facility])   ALT  Date Value Ref Range Status  10/25/2018 113 (H) 0 - 44 U/L Final  07/27/2018 28 0 - 44 U/L Final  06/23/2018 38 0 - 44 U/L Final  11/25/2017 44 0 - 44 U/L Final    AST  Date Value Ref Range Status  10/25/2018 177 (H) 15 - 41 U/L Final  07/27/2018 137 (H) 15 - 41 U/L Final  06/23/2018 64 (H) 15 - 41 U/L Final  11/25/2017 72 (H) 15 - 41 U/L Final    EKG EKG Interpretation  Date/Time:  Friday October 25 2018 00:00:13 EDT Ventricular Rate:  105 PR Interval:    QRS Duration: 79 QT Interval:  404 QTC Calculation: 534 R Axis:   78 Text Interpretation:  Sinus tachycardia Borderline T abnormalities, diffuse leads Prolonged QT interval No significant change since last tracing Confirmed by Orpah Greek [phone]) on 10/25/2018 1:01:00 AM   Radiology Dg Chest 2 View  Result Date: 10/25/2018 CLINICAL DATA:  Shortness of breath. Pleuritic chest pain. EXAM: CHEST - 2 VIEW COMPARISON:  Radiograph and CT 07/27/2018 FINDINGS: The cardiomediastinal contours are normal. The lungs are clear. Pulmonary vasculature is normal. No consolidation, pleural effusion, or pneumothorax. No acute osseous abnormalities are seen. IMPRESSION: Unremarkable radiographs of the chest. Electronically Signed   By: Keith Rake M.D.   On: 10/25/2018 01:21    Procedures Procedures (including  critical care time)  Medications Ordered in ED Medications  sodium chloride 0.9 % bolus 1,000 mL (0 mLs Intravenous Stopped 10/25/18 0233)  LORazepam (ATIVAN)  injection 1 mg (1 mg Intravenous Given 10/25/18 0258)  sodium chloride 0.9 % bolus 1,000 mL (1,000 mLs Intravenous New Bag/Given 10/25/18 0257)     Initial Impression / Assessment and Plan / ED Course  I have reviewed the triage vital signs and the nursing notes.  Pertinent labs & imaging results that were available during my care of the patient were reviewed by me and considered in my medical decision making (see chart for details).  Clinical Course as of Oct 25 338  Fri Oct 25, 2018  0320 Patient voices improvement.   [SJ]  0323 Manual pulse rate prior to discharge was 102.  Pulse Rate(!): 109 [SJ]  0323 Manual respiration rate prior to discharge was 20.  Resp(!): 31 [SJ]    Clinical Course User Index [SJ] Andersyn Fragoso C, PA-C       Patient presents with chest pain, shortness of breath, feeling of anxiety in the setting of reduced alcohol consumption.   I have low suspicion for ACS in this patient based on her description of the pain and its duration.  No abnormalities on EKG today to suggest acute ischemia, therefore, based on new parameters for using high-sensitivity troponin, this lab marker was not used. CO2 slightly lower than normal and with an increased anion gap of 16, suspect some dehydration.  She has had liver enzyme abnormalities in the past, today's values are slightly elevated above previous, however, patient has no abdominal symptoms and abdominal exam is benign. I suspect patient's symptoms and mild tachycardia may be due to some mild alcohol withdrawal. Patient has been seen before with similar complaint and similar results on work-up. We discussed next steps for alcoholism recovery, including follow-up with her PCP to assist her through the process.  Resources for addiction recovery were also provided.  The patient was given instructions for home care as well as return precautions. Patient voices understanding of these instructions, accepts the plan, and is  comfortable with discharge.  Findings and plan of care discussed with Dutch Quinthris Pollina, MD.     Vitals:   10/25/18 0230 10/25/18 0244 10/25/18 0300 10/25/18 0330  BP: (!) 120/55   111/64  Pulse: (!) 124 (!) 119 (!) 109 (!) 119  Resp: (!) 25 18 (!) 31 (!) 21  Temp:      TempSrc:      SpO2: 99% 98% 100% 99%     Final Clinical Impressions(s) / ED Diagnoses   Final diagnoses:  Prolonged Q-T interval on ECG  Anxious mood  Alcohol abuse    ED Discharge Orders         Ordered    chlordiazePOXIDE (LIBRIUM) 25 MG capsule     10/25/18 0325           Anselm PancoastJoy, Preston Garabedian C, PA-C 10/25/18 0346    Gilda CreasePollina, Christopher J, MD 10/25/18 [phone]

## 2018-10-26 LAB — NOVEL CORONAVIRUS, NAA (HOSP ORDER, SEND-OUT TO REF LAB; TAT 18-24 HRS): SARS-CoV-2, NAA: NOT DETECTED

## 2019-01-30 ENCOUNTER — Encounter (HOSPITAL_COMMUNITY): Payer: Self-pay

## 2019-01-30 ENCOUNTER — Emergency Department (HOSPITAL_COMMUNITY): Payer: Medicaid Other

## 2019-01-30 ENCOUNTER — Emergency Department (HOSPITAL_COMMUNITY)
Admission: EM | Admit: 2019-01-30 | Discharge: 2019-01-31 | Disposition: A | Discharge status: Home / Self Care | Payer: Medicaid Other | Attending: Emergency Medicine | Admitting: Emergency Medicine

## 2019-01-30 ENCOUNTER — Other Ambulatory Visit: Payer: Self-pay

## 2019-01-30 DIAGNOSIS — R110 Nausea: Secondary | ICD-10-CM | POA: Insufficient documentation

## 2019-01-30 DIAGNOSIS — R194 Change in bowel habit: Secondary | ICD-10-CM

## 2019-01-30 DIAGNOSIS — F1010 Alcohol abuse, uncomplicated: Secondary | ICD-10-CM

## 2019-01-30 DIAGNOSIS — E876 Hypokalemia: Secondary | ICD-10-CM

## 2019-01-30 DIAGNOSIS — Z79899 Other long term (current) drug therapy: Secondary | ICD-10-CM | POA: Insufficient documentation

## 2019-01-30 LAB — CBC
HCT: 39 % (ref 36.0–46.0)
Hemoglobin: 12.6 g/dL (ref 12.0–15.0)
MCH: 29.4 pg (ref 26.0–34.0)
MCHC: 32.3 g/dL (ref 30.0–36.0)
MCV: 90.9 fL (ref 80.0–100.0)
Platelets: 294 10*3/uL (ref 150–400)
RBC: 4.29 MIL/uL (ref 3.87–5.11)
RDW: 14.8 % (ref 11.5–15.5)
WBC: 6.2 10*3/uL (ref 4.0–10.5)
nRBC: 0.5 % — ABNORMAL HIGH (ref 0.0–0.2)

## 2019-01-30 LAB — COMPREHENSIVE METABOLIC PANEL
ALT: 37 U/L (ref 0–44)
AST: 66 U/L — ABNORMAL HIGH (ref 15–41)
Albumin: 4.1 g/dL (ref 3.5–5.0)
Alkaline Phosphatase: 57 U/L (ref 38–126)
Anion gap: 12 (ref 5–15)
BUN: 5 mg/dL — ABNORMAL LOW (ref 6–20)
CO2: 25 mmol/L (ref 22–32)
Calcium: 9.3 mg/dL (ref 8.9–10.3)
Chloride: 102 mmol/L (ref 98–111)
Creatinine, Ser: 0.78 mg/dL (ref 0.44–1.00)
GFR calc Af Amer: >60 mL/min (ref >60)
GFR calc non Af Amer: >60 mL/min (ref >60)
Glucose, Bld: 122 mg/dL — ABNORMAL HIGH (ref 70–99)
Potassium: 3.1 mmol/L — ABNORMAL LOW (ref 3.5–5.1)
Sodium: 139 mmol/L (ref 135–145)
Total Bilirubin: 0.6 mg/dL (ref 0.3–1.2)
Total Protein: 7.6 g/dL (ref 6.5–8.1)

## 2019-01-30 LAB — URINALYSIS, ROUTINE W REFLEX MICROSCOPIC
Bilirubin Urine: NEGATIVE
Glucose, UA: NEGATIVE mg/dL
Hgb urine dipstick: NEGATIVE
Ketones, ur: 5 mg/dL — AB
Leukocytes,Ua: NEGATIVE
Nitrite: NEGATIVE
Protein, ur: 100 mg/dL — AB
Specific Gravity, Urine: 1.024 (ref 1.005–1.030)
pH: 5 (ref 5.0–8.0)

## 2019-01-30 LAB — LIPASE, BLOOD: Lipase: 105 U/L — ABNORMAL HIGH (ref 11–51)

## 2019-01-30 MED ORDER — SODIUM CHLORIDE 0.9% FLUSH: 3.0000 mL | Freq: Once | INTRAVENOUS | Status: DC

## 2019-01-30 MED ORDER — POTASSIUM CHLORIDE CRYS ER 20 MEQ PO TBCR
40.0000 meq | EXTENDED_RELEASE_TABLET | Freq: Once | ORAL | Status: AC
  Administered 2019-01-31: 40 meq via ORAL
  Filled 2019-01-30: qty 2

## 2019-01-30 MED ORDER — ONDANSETRON 4 MG PO TBDP
4.0000 mg | ORAL_TABLET | Freq: Once | ORAL | Status: AC
  Administered 2019-01-31: 4 mg via ORAL
  Filled 2019-01-30: qty 1

## 2019-01-30 NOTE — ED Triage Notes (Signed)
Patient reports emesis and constipation x 2 days. 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 Patient states she had diarrhea last week and took an antidiarrheal medication, but has not had a BM since taking the medication. Patient also reports that she struggles with alcohol and has been trying to cut back drastically and is not sure if she is having withdrawal symptoms. Patient states she does not take in enough water and does not eat right because of her job.

## 2019-01-30 NOTE — ED Notes (Addendum)
Pt ambulated to the bathroom with no assistance. Gait steady  

## 2019-01-30 NOTE — ED Provider Notes (Signed)
[facility]-EMERGENCY DEPT Provider Note   CSN: [id number] Arrival date & time: 01/30/19  1803     History   Chief Complaint Chief Complaint  Patient presents with  . Vomiting  . Constipation    HPI Danielle Reilly is a 43 y.o. female.     The history is provided by the patient and medical records.  Constipation Associated symptoms: nausea and vomiting     43 y.o. F with hx of anemia, GERD, presenting to the ED for abdominal discomfort.  Patient reports last week she had diarrhea and took over-the-counter Imodium which helped with the diarrhea but states she has not had a solid bowel movement since that time.  She reports she has passed small amount of stool "like little turd balls" but no substantial bowel movement.  She had quite a bit of vomiting yesterday but none today, however is still quite nauseated.  States her abdomen feels full from lack of BM so has not really wanted to eat/drink much today.  Patient does admit to chronic alcohol use, at her max she was drinking half of 1/5 of liquor daily.  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  She does express interest in quitting completely as she knows this is not good for her.  Past Medical History:  Diagnosis Date  . Anemia   . GERD (gastroesophageal reflux disease)    tums as needed  . SVD (spontaneous vaginal delivery)    x 1    Patient Active Problem List   Diagnosis Date Noted  . Postoperative state 06/19/2013  . Gingivitis, acute 01/29/2013  . Toothache 01/29/2013  . Abnormal uterine bleeding 11/29/2012  . Hemorrhagic ovarian cyst 12/26/2010  . Hemoperitoneum 12/25/2010    Past Surgical History:  Procedure Laterality Date  . BILATERAL SALPINGECTOMY N/A 06/19/2013   Procedure: BILATERAL SALPINGECTOMY;  Surgeon: Willodean Rosenthalarolyn Harraway-Smith, MD;  Location: [facility];  Service: Gynecology;  Laterality:  N/A;  . DILITATION & CURRETTAGE/HYSTROSCOPY WITH NOVASURE ABLATION N/A 04/04/2013   Procedure: DILATATION & CURETTAGE/HYSTEROSCOPY WITH Attempted NOVASURE ABLATION ;  Surgeon: Willodean Rosenthalarolyn Harraway-Smith, MD;  Location: [facility];  Service: Gynecology;  Laterality: N/A;  . HYSTEROSCOPY N/A 05/26/2013   Procedure: UNSUCCESSFUL HYSTEROSCOPY WITH HYDROTHERMAL ABLATION;  Surgeon: Willodean Rosenthalarolyn Harraway-Smith, MD;  Location: [facility];  Service: Gynecology;  Laterality: N/A;  . LAPAROSCOPY  12/25/2010   Procedure: LAPAROSCOPY OPERATIVE;  Surgeon: Roseanna RainbowLisa A Jackson-Moore, MD;  Location: [facility];  Service: Gynecology;  Laterality: N/A;  Operative laparoscopy /cauterization of right hemorrhagic cyst wall. Removal of belly rings.  Marland Kitchen. LAPAROSCOPY FOR ECTOPIC PREGNANCY    . NOVASURE ABLATION N/A 05/26/2013   Procedure: UNSUCCESSFUL NOVASURE ABLATION;  Surgeon: Willodean Rosenthalarolyn Harraway-Smith, MD;  Location: [facility];  Service: Gynecology;  Laterality: N/A;  . uterine biopsy    . VAGINAL HYSTERECTOMY N/A 06/19/2013   Procedure:  TOTAL VAGINAL HYSTERECTOMY with bilateral salpingectomy;  Surgeon: Willodean Rosenthalarolyn Harraway-Smith, MD;  Location: [facility];  Service: Gynecology;  Laterality: N/A;     OB History    Gravida  4   Para  1   Term  1   Preterm      AB  3   Living  1     SAB  1   TAB  2   Ectopic  0   Multiple      Live Births  1  Home Medications    Prior to Admission medications   Medication Sig Start Date End Date Taking? Authorizing Provider  B Complex Vitamins (B COMPLEX 1 PO) Take by mouth.   Yes [provider]  ferrous sulfate 325 (65 FE) MG tablet Take 325 mg by mouth daily with breakfast.   Yes [provider]  Multiple Vitamin (MULTIVITAMIN WITH MINERALS) TABS tablet Take 1 tablet by mouth daily.   Yes [provider]  chlordiazePOXIDE (LIBRIUM) 25 MG capsule 50mg  PO TID x 1D, then 25-50mg  PO BID X 1D, then 25-50mg  PO QD X 1D Patient not taking: Reported on 01/30/2019 10/25/18    Joy, Shawn C, PA-C  famotidine (PEPCID) 20 MG tablet Take 1 tablet (20 mg total) by mouth 2 (two) times daily for 5 days. Patient not taking: Reported on 10/25/2018 11/25/17 07/27/18  Lorayne Bender, PA-C  omeprazole (PRILOSEC) 20 MG capsule Take 1 capsule (20 mg total) by mouth daily. Patient not taking: Reported on 10/25/2018 05/24/17   Recardo Evangelist, PA-C    Family History Family History  Problem Relation Age of Onset  . Drug abuse Mother   . Arthritis Maternal Aunt   . Cancer Maternal Aunt   . Drug abuse Maternal Aunt   . Arthritis Maternal Uncle   . Alcohol abuse Maternal Uncle   . Cancer Maternal Uncle   . Drug abuse Maternal Uncle   . Cancer Maternal Grandmother   . Alcohol abuse Maternal Grandfather   . Cancer Maternal Grandfather     Social History Social History   Tobacco Use  . Smoking status: Never Smoker  . Smokeless tobacco: Never Used  Substance Use Topics  . Alcohol use: Yes    Comment: 1/2 pint of liquor  a day  . Drug use: No     Allergies   Patient has no known allergies.   Review of Systems Review of Systems  Gastrointestinal: Positive for constipation, nausea and vomiting.  All other systems reviewed and are negative.    Physical Exam Updated Vital Signs BP (!) 131/92 (BP Location: Right Arm)   Pulse 74   Temp 98.3 F (36.8 C) (Oral)   Resp 16   Ht 5\' 4"  (1.626 m)   Wt 76.4 kg   LMP 06/19/2013   SpO2 100%   BMI 28.91 kg/m   Physical Exam Vitals signs and nursing note reviewed.  Constitutional:      Appearance: She is well-developed.  HENT:     Head: Normocephalic and atraumatic.  Eyes:     Conjunctiva/sclera: Conjunctivae normal.     Pupils: Pupils are equal, round, and reactive to light.  Neck:     Musculoskeletal: Normal range of motion.  Cardiovascular:     Rate and Rhythm: Normal rate and regular rhythm.     Heart sounds: Normal heart sounds.  Pulmonary:     Effort: Pulmonary effort is normal.     Breath sounds:  Normal breath sounds.  Abdominal:     General: Bowel sounds are normal.     Palpations: Abdomen is soft.     Tenderness: There is no abdominal tenderness. There is no guarding or rebound.     Comments: Soft, non-tender  Musculoskeletal: Normal range of motion.  Skin:    General: Skin is warm and dry.  Neurological:     Mental Status: She is alert and oriented to person, place, and time.      ED Treatments / Results  Labs (all labs ordered  are listed, but only abnormal results are displayed) Labs Reviewed  LIPASE, BLOOD - Abnormal; Notable for the following components:      Result Value   Lipase 105 (*)    All other components within normal limits  COMPREHENSIVE METABOLIC PANEL - Abnormal; Notable for the following components:   Potassium 3.1 (*)    Glucose, Bld 122 (*)    BUN 5 (*)    AST 66 (*)    All other components within normal limits  CBC - Abnormal; Notable for the following components:   nRBC 0.5 (*)    All other components within normal limits  URINALYSIS, ROUTINE W REFLEX MICROSCOPIC - Abnormal; Notable for the following components:   Color, Urine AMBER (*)    APPearance CLOUDY (*)    Ketones, ur 5 (*)    Protein, ur 100 (*)    Bacteria, UA MANY (*)    All other components within normal limits  URINE CULTURE    EKG None  Radiology Dg Abd Acute 2+v W 1v Chest  Result Date: 01/30/2019 CLINICAL DATA:  Abdominal pain, vomiting, and diarrhea for 2 days. EXAM: DG ABDOMEN ACUTE W/ 1V CHEST COMPARISON:  None. FINDINGS: There is no evidence of dilated bowel loops or free intraperitoneal air. No radiopaque calculi or other significant radiographic abnormality is seen. Heart size and mediastinal contours are within normal limits. Both lungs are clear. IMPRESSION: Negative abdominal radiographs.  No active cardiopulmonary disease. Electronically Signed   By: Danae Orleans M.D.   On: 01/30/2019 19:43    Procedures Procedures (including critical care time)   Medications Ordered in ED Medications  sodium chloride flush (NS) 0.9 % injection 3 mL (has no administration in time range)  potassium chloride SA (KLOR-CON) CR tablet 40 mEq (40 mEq Oral Given 01/31/19 0003)  ondansetron (ZOFRAN-ODT) disintegrating tablet 4 mg (4 mg Oral Given 01/31/19 0002)     Initial Impression / Assessment and Plan / ED Course  I have reviewed the triage vital signs and the nursing notes.  Pertinent labs & imaging results that were available during my care of the patient were reviewed by me and considered in my medical decision making (see chart for details).  43 year old female here with abdominal discomfort.  She had diarrhea last week and took Imodium, since then has not been able to have a regular bowel movement.  Did have some nausea and vomiting yesterday, better controlled today but with some mild nausea.  She is afebrile and nontoxic.  Her abdomen is overall soft and benign, she has normal bowel sounds.  No apparent distention.  Screening labs grossly reassuring.  LFTs have improved from prior, lipase is 105.  Mild hypokalemia 3.1.  Patient does admit to ongoing alcohol use.  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  She wants to do Librium taper.  Feel this is reasonable.  Discussed with her that if she does decide to drink she should stop taking this.  She was also given outpatient resources for detox centers, support groups, AA meetings, etc. if needed.  She was given dose of Zofran and potassium here.  Discharged home with symptomatic care.  Encouraged to increase water and fiber intake which should help with her bowel movements.  She will need close follow-up with PCP.  She is to return here for any new or acute changes.  Final Clinical Impressions(s) / ED Diagnoses  Final diagnoses:  Hypokalemia  Change in bowel habits  Alcohol use disorder, mild, abuse     ED Discharge Orders         Ordered    chlordiazePOXIDE (LIBRIUM) 25 MG capsule     01/31/19 0048    ondansetron (ZOFRAN ODT) 4 MG disintegrating tablet  Every 8 hours PRN     01/31/19 0048           Garlon Hatchet, PA-C 01/31/19 0054    Molpus, Jonny Ruiz, MD 01/31/19 [phone]

## 2019-01-31 LAB — URINE CULTURE: Culture: <10000 — AB

## 2019-01-31 MED ORDER — ONDANSETRON 4 MG PO TBDP: 4.0000 mg | ORAL_TABLET | Freq: Three times a day (TID) | ORAL | 0 refills | Status: DC | PRN

## 2019-01-31 MED ORDER — CHLORDIAZEPOXIDE HCL 25 MG PO CAPS: ORAL_CAPSULE | ORAL | 0 refills | Status: DC

## 2019-01-31 NOTE — Discharge Instructions (Signed)
Take the prescribed medication as directed.  Do not take librium if you are drinking alcohol. Try to increase water intake, make sure to eats lots of leafy green vegetables to help with bowel movements. Follow-up with your primary care doctor. Return to the ED for new or worsening symptoms.

## 2019-03-09 ENCOUNTER — Other Ambulatory Visit: Payer: Self-pay

## 2019-03-09 ENCOUNTER — Emergency Department (HOSPITAL_COMMUNITY)
Admission: EM | Admit: 2019-03-09 | Discharge: 2019-03-09 | Disposition: A | Discharge status: Home / Self Care | Payer: Medicaid Other | Attending: Emergency Medicine | Admitting: Emergency Medicine

## 2019-03-09 ENCOUNTER — Encounter (HOSPITAL_COMMUNITY): Payer: Self-pay | Admitting: Emergency Medicine

## 2019-03-09 DIAGNOSIS — F10230 Alcohol dependence with withdrawal, uncomplicated: Secondary | ICD-10-CM | POA: Insufficient documentation

## 2019-03-09 DIAGNOSIS — F331 Major depressive disorder, recurrent, moderate: Secondary | ICD-10-CM | POA: Insufficient documentation

## 2019-03-09 DIAGNOSIS — F4310 Post-traumatic stress disorder, unspecified: Secondary | ICD-10-CM | POA: Insufficient documentation

## 2019-03-09 DIAGNOSIS — F10930 Alcohol use, unspecified with withdrawal, uncomplicated: Secondary | ICD-10-CM

## 2019-03-09 DIAGNOSIS — R45851 Suicidal ideations: Secondary | ICD-10-CM | POA: Insufficient documentation

## 2019-03-09 LAB — COMPREHENSIVE METABOLIC PANEL
ALT: 42 U/L (ref 0–44)
AST: 105 U/L — ABNORMAL HIGH (ref 15–41)
Albumin: 3.9 g/dL (ref 3.5–5.0)
Alkaline Phosphatase: 67 U/L (ref 38–126)
Anion gap: 16 — ABNORMAL HIGH (ref 5–15)
BUN: 6 mg/dL (ref 6–20)
CO2: 23 mmol/L (ref 22–32)
Calcium: 8.7 mg/dL — ABNORMAL LOW (ref 8.9–10.3)
Chloride: 96 mmol/L — ABNORMAL LOW (ref 98–111)
Creatinine, Ser: 0.72 mg/dL (ref 0.44–1.00)
GFR calc Af Amer: >60 mL/min (ref >60)
GFR calc non Af Amer: >60 mL/min (ref >60)
Glucose, Bld: 125 mg/dL — ABNORMAL HIGH (ref 70–99)
Potassium: 3.1 mmol/L — ABNORMAL LOW (ref 3.5–5.1)
Sodium: 135 mmol/L (ref 135–145)
Total Bilirubin: 0.8 mg/dL (ref 0.3–1.2)
Total Protein: 7.5 g/dL (ref 6.5–8.1)

## 2019-03-09 LAB — SALICYLATE LEVEL: Salicylate Lvl: <7 mg/dL (ref 2.8–30.0)

## 2019-03-09 LAB — CBC
HCT: 38 % (ref 36.0–46.0)
Hemoglobin: 12.5 g/dL (ref 12.0–15.0)
MCH: 29.3 pg (ref 26.0–34.0)
MCHC: 32.9 g/dL (ref 30.0–36.0)
MCV: 89 fL (ref 80.0–100.0)
Platelets: 213 10*3/uL (ref 150–400)
RBC: 4.27 MIL/uL (ref 3.87–5.11)
RDW: 14.3 % (ref 11.5–15.5)
WBC: 4.4 10*3/uL (ref 4.0–10.5)
nRBC: 0.5 % — ABNORMAL HIGH (ref 0.0–0.2)

## 2019-03-09 LAB — RAPID URINE DRUG SCREEN, HOSP PERFORMED
Amphetamines: NOT DETECTED
Barbiturates: NOT DETECTED
Benzodiazepines: POSITIVE — AB
Cocaine: NOT DETECTED
Opiates: NOT DETECTED
Tetrahydrocannabinol: NOT DETECTED

## 2019-03-09 LAB — ETHANOL: Alcohol, Ethyl (B): 69 mg/dL — ABNORMAL HIGH (ref <10)

## 2019-03-09 LAB — ACETAMINOPHEN LEVEL: Acetaminophen (Tylenol), Serum: <10 ug/mL — ABNORMAL LOW (ref 10–30)

## 2019-03-09 MED ORDER — MAGNESIUM OXIDE 400 (241.3 MG) MG PO TABS
400.0000 mg | ORAL_TABLET | Freq: Once | ORAL | Status: AC
  Administered 2019-03-09: 08:00:00 400 mg via ORAL
  Filled 2019-03-09: qty 1

## 2019-03-09 MED ORDER — ONDANSETRON HCL 4 MG PO TABS: 4.0000 mg | ORAL_TABLET | Freq: Three times a day (TID) | ORAL | 0 refills | Status: DC | PRN

## 2019-03-09 MED ORDER — THIAMINE HCL 100 MG/ML IJ SOLN
Freq: Once | INTRAVENOUS | Status: AC
  Administered 2019-03-09: 08:00:00 via INTRAVENOUS
  Filled 2019-03-09: qty 1000

## 2019-03-09 MED ORDER — CHLORDIAZEPOXIDE HCL 25 MG PO CAPS
50.0000 mg | ORAL_CAPSULE | Freq: Once | ORAL | Status: AC
  Administered 2019-03-09: 50 mg via ORAL
  Filled 2019-03-09: qty 2

## 2019-03-09 MED ORDER — THIAMINE HCL 100 MG/ML IJ SOLN: Freq: Once | INTRAVENOUS | Status: DC

## 2019-03-09 MED ORDER — METHOCARBAMOL 500 MG PO TABS
500.0000 mg | ORAL_TABLET | Freq: Once | ORAL | Status: AC
  Administered 2019-03-09: 08:00:00 500 mg via ORAL
  Filled 2019-03-09: qty 1

## 2019-03-09 MED ORDER — POTASSIUM CHLORIDE CRYS ER 20 MEQ PO TBCR
40.0000 meq | EXTENDED_RELEASE_TABLET | Freq: Once | ORAL | Status: AC
  Administered 2019-03-09: 08:00:00 40 meq via ORAL
  Filled 2019-03-09: qty 2

## 2019-03-09 MED ORDER — ONDANSETRON HCL 4 MG/2ML IJ SOLN
4.0000 mg | Freq: Once | INTRAMUSCULAR | Status: AC
  Administered 2019-03-09: 4 mg via INTRAVENOUS
  Filled 2019-03-09: qty 2

## 2019-03-09 MED ORDER — ONDANSETRON 4 MG PO TBDP
4.0000 mg | ORAL_TABLET | Freq: Once | ORAL | Status: DC | PRN
  Filled 2019-03-09: qty 1

## 2019-03-09 MED ORDER — GABAPENTIN 300 MG PO CAPS: 300.0000 mg | ORAL_CAPSULE | Freq: Three times a day (TID) | ORAL | 0 refills | Status: DC | PRN

## 2019-03-09 MED ORDER — LORAZEPAM 2 MG/ML IJ SOLN
2.0000 mg | Freq: Once | INTRAMUSCULAR | Status: AC
  Administered 2019-03-09: 06:00:00 2 mg via INTRAVENOUS
  Filled 2019-03-09: qty 1

## 2019-03-09 NOTE — Discharge Instructions (Signed)
Please follow-up with outpatient psychiatry team for both substance abuse and no suicidal thoughts.  Please use the gabapentin to help with your symptoms.  Please use the nausea medicine to with your symptoms.  Please stay hydrated and rest.  If any symptoms change or worsen or you have new/worsening suicidal ideation or withdrawal symptoms, please return to nearest emergency department daily.

## 2019-03-09 NOTE — ED Provider Notes (Signed)
Emergency Department Provider Note   I have reviewed the triage vital signs and the nursing notes.   HISTORY  Chief Complaint Alcohol Withdrawal and Suicidal   HPI Danielle Reilly is a 43 y.o. female who presents to the ED with tremors, vomiting for acouple hours and likely withdrawal after not drinking for a 24 hours. Also states suicidal thoughts after long time of depression.    No other associated or modifying symptoms.    Past Medical History:  Diagnosis Date  . Anemia   . GERD (gastroesophageal reflux disease)    tums as needed  . SVD (spontaneous vaginal delivery)    x 1    Patient Active Problem List   Diagnosis Date Noted  . Postoperative state 06/19/2013  . Gingivitis, acute 01/29/2013  . Toothache 01/29/2013  . Abnormal uterine bleeding 11/29/2012  . Hemorrhagic ovarian cyst 12/26/2010  . Hemoperitoneum 12/25/2010    Past Surgical History:  Procedure Laterality Date  . BILATERAL SALPINGECTOMY N/A 06/19/2013   Procedure: BILATERAL SALPINGECTOMY;  Surgeon: Willodean Rosenthal, MD;  Location: [facility];  Service: Gynecology;  Laterality: N/A;  . DILITATION & CURRETTAGE/HYSTROSCOPY WITH NOVASURE ABLATION N/A 04/04/2013   Procedure: DILATATION & CURETTAGE/HYSTEROSCOPY WITH Attempted NOVASURE ABLATION ;  Surgeon: Willodean Rosenthal, MD;  Location: [facility];  Service: Gynecology;  Laterality: N/A;  . HYSTEROSCOPY N/A 05/26/2013   Procedure: UNSUCCESSFUL HYSTEROSCOPY WITH HYDROTHERMAL ABLATION;  Surgeon: Willodean Rosenthal, MD;  Location: [facility];  Service: Gynecology;  Laterality: N/A;  . LAPAROSCOPY  12/25/2010   Procedure: LAPAROSCOPY OPERATIVE;  Surgeon: Roseanna Rainbow, MD;  Location: [facility];  Service: Gynecology;  Laterality: N/A;  Operative laparoscopy /cauterization of right hemorrhagic cyst wall. Removal of belly rings.  Marland Kitchen LAPAROSCOPY FOR ECTOPIC PREGNANCY    . NOVASURE ABLATION N/A 05/26/2013   Procedure: UNSUCCESSFUL NOVASURE ABLATION;  Surgeon: Willodean Rosenthal, MD;  Location: [facility];  Service: Gynecology;  Laterality: N/A;  . uterine biopsy    . VAGINAL HYSTERECTOMY N/A 06/19/2013   Procedure:  TOTAL VAGINAL HYSTERECTOMY with bilateral salpingectomy;  Surgeon: Willodean Rosenthal, MD;  Location: [facility];  Service: Gynecology;  Laterality: N/A;    Current Outpatient Rx  . Order #: [id number] Class: Historical Med  . Order #: [id number] Class: Print  . Order #: [id number] Class: Normal  . Order #: [id number] Class: Historical Med  . Order #: [id number] Class: Historical Med  . Order #: [id number] Class: Print  . Order #: [id number] Class: Print    Allergies Patient has no known allergies.  Family History  Problem Relation Age of Onset  . Drug abuse Mother   . Arthritis Maternal Aunt   . Cancer Maternal Aunt   . Drug abuse Maternal Aunt   . Arthritis Maternal Uncle   . Alcohol abuse Maternal Uncle   . Cancer Maternal Uncle   . Drug abuse Maternal Uncle   . Cancer Maternal Grandmother   . Alcohol abuse Maternal Grandfather   . Cancer Maternal Grandfather     Social History Social History   Tobacco Use  . Smoking status: Never Smoker  . Smokeless tobacco: Never Used  Substance Use Topics  . Alcohol use: Yes    Comment: 1/2 pint of liquor  a day  . Drug use: No    Review of Systems  All other systems negative except as documented in the HPI. All pertinent positives and negatives as reviewed in the HPI. ____________________________________________   PHYSICAL EXAM:  VITAL SIGNS: ED Triage Vitals  Enc Vitals Group  BP 03/09/19 0359 120/88     Pulse Rate 03/09/19 0359 (!) 101     Resp 03/09/19 0359 17     Temp 03/09/19 0359 98.2 F (36.8 C)     Temp Source 03/09/19 0359 Oral     SpO2 03/09/19 0359 98 %     Weight 03/09/19 0359 165 lb (74.8 kg)     Height 03/09/19 0359 5\' 4"  (1.626 m)    Constitutional: Alert and oriented. Well appearing and in no acute distress. Eyes: Conjunctivae are normal. PERRL. EOMI.  Head: Atraumatic. Nose: No congestion/rhinnorhea. Mouth/Throat: Mucous membranes are moist.  Oropharynx non-erythematous. Neck: No stridor.  No meningeal signs.   Cardiovascular: tachycardic rate, regular rhythm. Good peripheral circulation. Grossly normal heart sounds.   Respiratory: tachypneic respiratory effort.  No retractions. Lungs CTAB. Gastrointestinal: Soft and nontender. No distention.  Musculoskeletal: No lower extremity tenderness nor edema. No gross deformities of extremities. Neurologic:  Normal speech and language. No gross focal neurologic deficits are appreciated. Tremulous.  Skin:  Skin is warm, dry and intact. No rash noted.  ____________________________________________   LABS (all labs ordered are listed, but only abnormal results are displayed)  Labs Reviewed  COMPREHENSIVE METABOLIC PANEL - Abnormal; Notable for the following components:      Result Value   Potassium 3.1 (*)    Chloride 96 (*)    Glucose, Bld 125 (*)    Calcium 8.7 (*)    AST 105 (*)    Anion gap 16 (*)    All other components within normal limits  ETHANOL - Abnormal; Notable for the following components:   Alcohol, Ethyl (B) 69 (*)    All other components within normal limits  ACETAMINOPHEN LEVEL - Abnormal; Notable for the following components:   Acetaminophen (Tylenol), Serum <10 (*)    All other components within normal limits  CBC - Abnormal; Notable for the following components:   nRBC 0.5 (*)    All other components within normal limits  RAPID URINE DRUG SCREEN, HOSP PERFORMED - Abnormal; Notable for the following components:   Benzodiazepines POSITIVE (*)    All other components within normal limits  SALICYLATE LEVEL   ____________________________________________    INITIAL IMPRESSION / ASSESSMENT AND PLAN / ED COURSE  Here in withdrawals, ativan and banana bag given.   Now also with left hip pain c/w likely hip flexor sprain. Will treat for same.   Patient is medically  cleared at this time for tts consult.   If patient is cleared psychiatrically, dc on librium. If not, continue ciwa.   Pertinent labs & imaging results that were available during my care of the patient were reviewed by me and considered in my medical decision making (see chart for details).   ____________________________________________  FINAL CLINICAL IMPRESSION(S) / ED DIAGNOSES  Final diagnoses:  None     MEDICATIONS GIVEN DURING THIS VISIT:  Medications  sodium chloride 0.9 % 1,000 mL with thiamine 235 mg, folic acid 1 mg, multivitamins adult 10 mL infusion (has no administration in time range)  chlordiazePOXIDE (LIBRIUM) capsule 50 mg (has no administration in time range)  LORazepam (ATIVAN) injection 2 mg (2 mg Intravenous Given 03/09/19 0546)  ondansetron (ZOFRAN) injection 4 mg (4 mg Intravenous Given 03/09/19 0546)     NEW OUTPATIENT MEDICATIONS STARTED DURING THIS VISIT:  New Prescriptions   No medications on file    Note:  This note was prepared with assistance of Dragon voice recognition software. Occasional wrong-word or sound-a-like substitutions  may have occurred due to the inherent limitations of voice recognition software.   Finnis Colee, Barbara CowerJason, MD 03/09/19 [phone]

## 2019-03-09 NOTE — ED Triage Notes (Signed)
Pt here for alcohol withdrawal, last drink yesterday (11/21) afternoon, states she normally drinks a pint a day. Pt also reports feeling like she wants to harm herself, no plan at this time.

## 2019-03-09 NOTE — Progress Notes (Signed)
CSW faxed residential and outpatient SA resources to [facility] ED at [phone]. CSW spoke with Jacqlyn Larsen, RN to inform of fax being sent.   Darletta Moll MSW, [place] Worker Disposition  [facility] Ph: [phone] Fax: [phone]

## 2019-03-09 NOTE — ED Provider Notes (Signed)
7:48 AM Care assumed from Dr. Dayna Barker.  At time of transfer care, patient is waiting results of TTS consultation.  If patient is followed for for discharge home, anticipate prescription for Librium taper for alcohol withdrawal and discharge.  3:25 PM The TTS team evaluated the patient and felt she was safe for discharge home.  Patient's CIWA score was less than 7.  The TTS team felt she would benefit from a gabapentin prescription and outpatient substance and mental health follow-up.  They did not feel she needs admission at this time.  They requested she be discharged.  Patient was more nausea and was given another dose of Zofran.  She would like the gabapentin instead of the Librium taper we discussed.  Patient given prescription for the Zofran and the gabapentin will be discharged for outpatient follow-up as recommended by psychiatry.  Clinical Impression: 1. Alcohol withdrawal syndrome without complication ([place])   2. Suicidal ideation     Disposition: Discharge  Condition: Good  I have discussed the results, Dx and Tx plan with the pt(& family if present). He/she/they expressed understanding and agree(s) with the plan. Discharge instructions discussed at great length. Strict return precautions discussed and pt &/or family have verbalized understanding of the instructions. No further questions at time of discharge.    New Prescriptions   GABAPENTIN (NEURONTIN) 300 MG CAPSULE    Take 1 capsule (300 mg total) by mouth 3 (three) times daily as needed (withdrawl).   ONDANSETRON (ZOFRAN) 4 MG TABLET    Take 1 tablet (4 mg total) by mouth every 8 (eight) hours as needed.    Follow Up: [place] [id number] [phone] Schedule an appointment as soon as possible for a visit    [place] [address] [phone] [place]  [place] [phone]    outpatient psychiatry team           Bonita Brindisi, Gwenyth Allegra, MD 03/09/19 [phone]

## 2019-03-09 NOTE — Progress Notes (Signed)
Patient meets criteria for inpatient treatment per Merlyn Lot, NP. No appropriate beds at [facility] currently. CSW faxed referrals to the following facilities for review: [place]   [facility]   [facility]   [facility]   [facility]   [facility]   [place]     TSS will continue to seek bed placement.     Darletta Moll MSW, [place] Worker Disposition  [facility] Ph: [phone] Fax: [phone] 03/09/2019 10:56 AM
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
## 2019-03-09 NOTE — Consult Note (Signed)
Reviewed patient's records.  Patient currently having active withdrawal symptoms, on CIWA protocol.  She meets the criteria for inpatient admissions for detox.  Update to social work and ED nurse Eber Hong.

## 2019-05-19 ENCOUNTER — Encounter (HOSPITAL_COMMUNITY): Payer: Self-pay

## 2019-05-19 ENCOUNTER — Other Ambulatory Visit: Payer: Self-pay

## 2019-05-19 ENCOUNTER — Emergency Department (HOSPITAL_COMMUNITY)
Admission: EM | Admit: 2019-05-19 | Discharge: 2019-05-19 | Disposition: A | Discharge status: Home / Self Care | Payer: Medicaid Other | Attending: Emergency Medicine | Admitting: Emergency Medicine

## 2019-05-19 DIAGNOSIS — R112 Nausea with vomiting, unspecified: Secondary | ICD-10-CM

## 2019-05-19 DIAGNOSIS — E860 Dehydration: Secondary | ICD-10-CM

## 2019-05-19 DIAGNOSIS — F1010 Alcohol abuse, uncomplicated: Secondary | ICD-10-CM

## 2019-05-19 DIAGNOSIS — Z20822 Contact with and (suspected) exposure to covid-19: Secondary | ICD-10-CM | POA: Insufficient documentation

## 2019-05-19 DIAGNOSIS — F4321 Adjustment disorder with depressed mood: Secondary | ICD-10-CM

## 2019-05-19 DIAGNOSIS — F10230 Alcohol dependence with withdrawal, uncomplicated: Secondary | ICD-10-CM

## 2019-05-19 DIAGNOSIS — F10930 Alcohol use, unspecified with withdrawal, uncomplicated: Secondary | ICD-10-CM

## 2019-05-19 DIAGNOSIS — K219 Gastro-esophageal reflux disease without esophagitis: Secondary | ICD-10-CM

## 2019-05-19 LAB — RAPID URINE DRUG SCREEN, HOSP PERFORMED
Amphetamines: NOT DETECTED
Barbiturates: NOT DETECTED
Benzodiazepines: NOT DETECTED
Cocaine: NOT DETECTED
Opiates: NOT DETECTED
Tetrahydrocannabinol: POSITIVE — AB

## 2019-05-19 LAB — URINALYSIS, ROUTINE W REFLEX MICROSCOPIC
Bacteria, UA: NONE SEEN
Bilirubin Urine: NEGATIVE
Glucose, UA: NEGATIVE mg/dL
Ketones, ur: 80 mg/dL — AB
Leukocytes,Ua: NEGATIVE
Nitrite: NEGATIVE
Protein, ur: 100 mg/dL — AB
Specific Gravity, Urine: 1.026 (ref 1.005–1.030)
pH: 6 (ref 5.0–8.0)

## 2019-05-19 LAB — COMPREHENSIVE METABOLIC PANEL
ALT: 26 U/L (ref 0–44)
AST: 42 U/L — ABNORMAL HIGH (ref 15–41)
Albumin: 4.5 g/dL (ref 3.5–5.0)
Alkaline Phosphatase: 50 U/L (ref 38–126)
Anion gap: 16 — ABNORMAL HIGH (ref 5–15)
BUN: <5 mg/dL — ABNORMAL LOW (ref 6–20)
CO2: 20 mmol/L — ABNORMAL LOW (ref 22–32)
Calcium: 10.2 mg/dL (ref 8.9–10.3)
Chloride: 99 mmol/L (ref 98–111)
Creatinine, Ser: 0.89 mg/dL (ref 0.44–1.00)
GFR calc Af Amer: >60 mL/min (ref >60)
GFR calc non Af Amer: >60 mL/min (ref >60)
Glucose, Bld: 106 mg/dL — ABNORMAL HIGH (ref 70–99)
Potassium: 3.8 mmol/L (ref 3.5–5.1)
Sodium: 135 mmol/L (ref 135–145)
Total Bilirubin: 2 mg/dL — ABNORMAL HIGH (ref 0.3–1.2)
Total Protein: 7.8 g/dL (ref 6.5–8.1)

## 2019-05-19 LAB — CBC
HCT: 39.7 % (ref 36.0–46.0)
Hemoglobin: 12.9 g/dL (ref 12.0–15.0)
MCH: 29 pg (ref 26.0–34.0)
MCHC: 32.5 g/dL (ref 30.0–36.0)
MCV: 89.2 fL (ref 80.0–100.0)
Platelets: 301 10*3/uL (ref 150–400)
RBC: 4.45 MIL/uL (ref 3.87–5.11)
RDW: 13.1 % (ref 11.5–15.5)
WBC: 6.7 10*3/uL (ref 4.0–10.5)
nRBC: 0 % (ref 0.0–0.2)

## 2019-05-19 LAB — I-STAT BETA HCG BLOOD, ED (MC, WL, AP ONLY): I-stat hCG, quantitative: <5 m[IU]/mL (ref <5)

## 2019-05-19 LAB — ETHANOL: Alcohol, Ethyl (B): <10 mg/dL (ref <10)

## 2019-05-19 LAB — LIPASE, BLOOD: Lipase: 27 U/L (ref 11–51)

## 2019-05-19 MED ORDER — FAMOTIDINE IN NACL 20-0.9 MG/50ML-% IV SOLN
20.0000 mg | Freq: Once | INTRAVENOUS | Status: AC
  Administered 2019-05-19: 20 mg via INTRAVENOUS
  Filled 2019-05-19: qty 50

## 2019-05-19 MED ORDER — ONDANSETRON 4 MG PO TBDP: 4.0000 mg | ORAL_TABLET | Freq: Three times a day (TID) | ORAL | 0 refills | Status: DC | PRN

## 2019-05-19 MED ORDER — CHLORDIAZEPOXIDE HCL 25 MG PO CAPS: ORAL_CAPSULE | ORAL | 0 refills | Status: DC

## 2019-05-19 MED ORDER — ONDANSETRON HCL 4 MG/2ML IJ SOLN
4.0000 mg | Freq: Once | INTRAMUSCULAR | Status: AC
  Administered 2019-05-19: 4 mg via INTRAVENOUS
  Filled 2019-05-19: qty 2

## 2019-05-19 MED ORDER — FAMOTIDINE 20 MG PO TABS: 20.0000 mg | ORAL_TABLET | Freq: Every day | ORAL | 0 refills | Status: DC

## 2019-05-19 MED ORDER — ALUM & MAG HYDROXIDE-SIMETH 200-200-20 MG/5ML PO SUSP
30.0000 mL | Freq: Once | ORAL | Status: AC
  Administered 2019-05-19: 30 mL via ORAL
  Filled 2019-05-19: qty 30

## 2019-05-19 MED ORDER — LORAZEPAM 2 MG/ML IJ SOLN
1.0000 mg | Freq: Once | INTRAMUSCULAR | Status: AC
  Administered 2019-05-19: 1 mg via INTRAVENOUS
  Filled 2019-05-19: qty 1

## 2019-05-19 MED ORDER — SODIUM CHLORIDE 0.9 % IV BOLUS
1000.0000 mL | Freq: Once | INTRAVENOUS | Status: AC
  Administered 2019-05-19: 1000 mL via INTRAVENOUS

## 2019-05-19 MED ORDER — LIDOCAINE VISCOUS HCL 2 % MT SOLN
15.0000 mL | Freq: Once | OROMUCOSAL | Status: AC
  Administered 2019-05-19: 15 mL via ORAL
  Filled 2019-05-19: qty 15

## 2019-05-19 MED ORDER — GABAPENTIN 300 MG PO CAPS: 300.0000 mg | ORAL_CAPSULE | Freq: Three times a day (TID) | ORAL | 0 refills | Status: DC | PRN

## 2019-05-19 NOTE — ED Triage Notes (Signed)
Pt going through alcohol withdrawal, last drink yesterday a whole bottle of champagne. Pt was seen here in Nov for the same and states she had been doing good until about 1 week ago. Recent loss of her mother. Pt tachycardic in triage 120s. Pt c.o inflammation in her throat, congestion, chest pain, headache, some nausea no vomiting. Reports some auditory hallucinations. No si/hi.
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
## 2019-05-19 NOTE — ED Notes (Signed)
Pt ambulatory to and from BR with strong, steady gait. Urine collected, labeled with 2 pt identifiers, and sent to lab 

## 2019-05-20 LAB — SARS CORONAVIRUS 2 (TAT 6-24 HRS): SARS Coronavirus 2: NEGATIVE

## 2019-06-17 ENCOUNTER — Other Ambulatory Visit: Payer: Self-pay

## 2019-06-17 ENCOUNTER — Emergency Department (HOSPITAL_COMMUNITY): Payer: Self-pay

## 2019-06-17 ENCOUNTER — Inpatient Hospital Stay (HOSPITAL_COMMUNITY)
Admission: EM | Admit: 2019-06-17 | Discharge: 2019-06-20 | DRG: 897 | Disposition: A | Discharge status: Home / Self Care | Payer: Self-pay | Attending: Family Medicine | Admitting: Family Medicine

## 2019-06-17 ENCOUNTER — Encounter (HOSPITAL_COMMUNITY): Payer: Self-pay | Admitting: Emergency Medicine

## 2019-06-17 DIAGNOSIS — F10239 Alcohol dependence with withdrawal, unspecified: Principal | ICD-10-CM | POA: Diagnosis present

## 2019-06-17 DIAGNOSIS — D649 Anemia, unspecified: Secondary | ICD-10-CM | POA: Diagnosis present

## 2019-06-17 DIAGNOSIS — R112 Nausea with vomiting, unspecified: Secondary | ICD-10-CM

## 2019-06-17 DIAGNOSIS — Z811 Family history of alcohol abuse and dependence: Secondary | ICD-10-CM

## 2019-06-17 DIAGNOSIS — F10229 Alcohol dependence with intoxication, unspecified: Secondary | ICD-10-CM | POA: Diagnosis present

## 2019-06-17 DIAGNOSIS — F10939 Alcohol use, unspecified with withdrawal, unspecified: Secondary | ICD-10-CM | POA: Diagnosis present

## 2019-06-17 DIAGNOSIS — K219 Gastro-esophageal reflux disease without esophagitis: Secondary | ICD-10-CM

## 2019-06-17 DIAGNOSIS — B370 Candidal stomatitis: Secondary | ICD-10-CM | POA: Diagnosis present

## 2019-06-17 DIAGNOSIS — Y906 Blood alcohol level of 120-199 mg/100 ml: Secondary | ICD-10-CM | POA: Diagnosis present

## 2019-06-17 DIAGNOSIS — Z20822 Contact with and (suspected) exposure to covid-19: Secondary | ICD-10-CM | POA: Diagnosis present

## 2019-06-17 DIAGNOSIS — R000 Tachycardia, unspecified: Secondary | ICD-10-CM

## 2019-06-17 DIAGNOSIS — Z813 Family history of other psychoactive substance abuse and dependence: Secondary | ICD-10-CM

## 2019-06-17 DIAGNOSIS — Z809 Family history of malignant neoplasm, unspecified: Secondary | ICD-10-CM

## 2019-06-17 DIAGNOSIS — E872 Acidosis: Secondary | ICD-10-CM | POA: Diagnosis present

## 2019-06-17 DIAGNOSIS — K760 Fatty (change of) liver, not elsewhere classified: Secondary | ICD-10-CM | POA: Diagnosis present

## 2019-06-17 DIAGNOSIS — E876 Hypokalemia: Secondary | ICD-10-CM

## 2019-06-17 DIAGNOSIS — F419 Anxiety disorder, unspecified: Secondary | ICD-10-CM | POA: Diagnosis present

## 2019-06-17 DIAGNOSIS — E8729 Other acidosis: Secondary | ICD-10-CM

## 2019-06-17 HISTORY — DX: Nausea with vomiting, unspecified: R11.2

## 2019-06-17 LAB — COMPREHENSIVE METABOLIC PANEL
ALT: 30 U/L (ref 0–44)
AST: 60 U/L — ABNORMAL HIGH (ref 15–41)
Albumin: 4.4 g/dL (ref 3.5–5.0)
Alkaline Phosphatase: 59 U/L (ref 38–126)
Anion gap: 20 — ABNORMAL HIGH (ref 5–15)
BUN: 9 mg/dL (ref 6–20)
CO2: 17 mmol/L — ABNORMAL LOW (ref 22–32)
Calcium: 8.8 mg/dL — ABNORMAL LOW (ref 8.9–10.3)
Chloride: 99 mmol/L (ref 98–111)
Creatinine, Ser: 0.77 mg/dL (ref 0.44–1.00)
GFR calc Af Amer: >60 mL/min (ref >60)
GFR calc non Af Amer: >60 mL/min (ref >60)
Glucose, Bld: 162 mg/dL — ABNORMAL HIGH (ref 70–99)
Potassium: 3.1 mmol/L — ABNORMAL LOW (ref 3.5–5.1)
Sodium: 136 mmol/L (ref 135–145)
Total Bilirubin: 1 mg/dL (ref 0.3–1.2)
Total Protein: 7.7 g/dL (ref 6.5–8.1)

## 2019-06-17 LAB — RAPID URINE DRUG SCREEN, HOSP PERFORMED
Amphetamines: NOT DETECTED
Barbiturates: NOT DETECTED
Benzodiazepines: POSITIVE — AB
Cocaine: NOT DETECTED
Opiates: NOT DETECTED
Tetrahydrocannabinol: NOT DETECTED

## 2019-06-17 LAB — CBC
HCT: 38.3 % (ref 36.0–46.0)
Hemoglobin: 12.3 g/dL (ref 12.0–15.0)
MCH: 28.5 pg (ref 26.0–34.0)
MCHC: 32.1 g/dL (ref 30.0–36.0)
MCV: 88.9 fL (ref 80.0–100.0)
Platelets: 212 10*3/uL (ref 150–400)
RBC: 4.31 MIL/uL (ref 3.87–5.11)
RDW: 14.2 % (ref 11.5–15.5)
WBC: 6.1 10*3/uL (ref 4.0–10.5)
nRBC: 0 % (ref 0.0–0.2)

## 2019-06-17 LAB — ETHANOL: Alcohol, Ethyl (B): 152 mg/dL — ABNORMAL HIGH (ref <10)

## 2019-06-17 LAB — LIPASE, BLOOD: Lipase: 26 U/L (ref 11–51)

## 2019-06-17 LAB — MAGNESIUM: Magnesium: 2 mg/dL (ref 1.7–2.4)

## 2019-06-17 MED ORDER — POTASSIUM CHLORIDE CRYS ER 20 MEQ PO TBCR
20.0000 meq | EXTENDED_RELEASE_TABLET | Freq: Once | ORAL | Status: AC
  Administered 2019-06-17: 20 meq via ORAL

## 2019-06-17 MED ORDER — SODIUM CHLORIDE 0.9 % IV BOLUS
1000.0000 mL | Freq: Once | INTRAVENOUS | Status: AC
  Administered 2019-06-17: 1000 mL via INTRAVENOUS

## 2019-06-17 MED ORDER — ALUM & MAG HYDROXIDE-SIMETH 200-200-20 MG/5ML PO SUSP
30.0000 mL | Freq: Once | ORAL | Status: AC
  Administered 2019-06-17: 30 mL via ORAL
  Filled 2019-06-17: qty 30

## 2019-06-17 MED ORDER — POTASSIUM CHLORIDE CRYS ER 20 MEQ PO TBCR
40.0000 meq | EXTENDED_RELEASE_TABLET | Freq: Once | ORAL | Status: AC
  Administered 2019-06-17: 40 meq via ORAL
  Filled 2019-06-17: qty 2

## 2019-06-17 MED ORDER — THIAMINE HCL 100 MG/ML IJ SOLN
100.0000 mg | Freq: Every day | INTRAMUSCULAR | Status: DC
  Filled 2019-06-17: qty 2

## 2019-06-17 MED ORDER — LORAZEPAM 2 MG/ML IJ SOLN
0.0000 mg | Freq: Four times a day (QID) | INTRAMUSCULAR | Status: DC
  Administered 2019-06-17: 2 mg via INTRAVENOUS
  Filled 2019-06-17: qty 1

## 2019-06-17 MED ORDER — LORAZEPAM 1 MG PO TABS: 0.0000 mg | ORAL_TABLET | Freq: Four times a day (QID) | ORAL | Status: DC

## 2019-06-17 MED ORDER — LORAZEPAM 2 MG/ML IJ SOLN
1.0000 mg | INTRAMUSCULAR | Status: DC | PRN
  Administered 2019-06-17 – 2019-06-18 (×3): 2 mg via INTRAVENOUS
  Administered 2019-06-19: 1 mg via INTRAVENOUS
  Filled 2019-06-17 (×4): qty 1

## 2019-06-17 MED ORDER — LIDOCAINE VISCOUS HCL 2 % MT SOLN
15.0000 mL | Freq: Once | OROMUCOSAL | Status: AC
  Administered 2019-06-17: 15 mL via ORAL
  Filled 2019-06-17: qty 15

## 2019-06-17 MED ORDER — LORAZEPAM 2 MG/ML IJ SOLN: 0.0000 mg | Freq: Two times a day (BID) | INTRAMUSCULAR | Status: DC

## 2019-06-17 MED ORDER — ONDANSETRON HCL 4 MG/2ML IJ SOLN
4.0000 mg | Freq: Once | INTRAMUSCULAR | Status: AC
  Administered 2019-06-17: 4 mg via INTRAVENOUS
  Filled 2019-06-17: qty 2

## 2019-06-17 MED ORDER — LORAZEPAM 1 MG PO TABS: 0.0000 mg | ORAL_TABLET | Freq: Two times a day (BID) | ORAL | Status: DC

## 2019-06-17 MED ORDER — IOHEXOL 300 MG/ML  SOLN
100.0000 mL | Freq: Once | INTRAMUSCULAR | Status: AC | PRN
  Administered 2019-06-17: 100 mL via INTRAVENOUS

## 2019-06-17 MED ORDER — THIAMINE HCL 100 MG PO TABS
100.0000 mg | ORAL_TABLET | Freq: Every day | ORAL | Status: DC
  Administered 2019-06-17 – 2019-06-20 (×4): 100 mg via ORAL
  Filled 2019-06-17 (×4): qty 1

## 2019-06-17 MED ORDER — LORAZEPAM 1 MG PO TABS
1.0000 mg | ORAL_TABLET | ORAL | Status: DC | PRN
  Administered 2019-06-18 – 2019-06-19 (×2): 1 mg via ORAL
  Filled 2019-06-17: qty 1
  Filled 2019-06-17: qty 4

## 2019-06-17 NOTE — ED Triage Notes (Signed)
Was just here  On the 2/1 for the same, denies SI and HI  But has been drinking to much brought on by  Her moms  Recent death in dec  states last drink yesterday

## 2019-06-17 NOTE — ED Notes (Signed)
Pt denies SI and HI. Pt dressed back in her clothes and sent back out to lobby to wait.

## 2019-06-17 NOTE — ED Notes (Signed)
Chaplain paged-per request of Patient paged by Marylene Land

## 2019-06-17 NOTE — ED Notes (Signed)
Pt transported to CT ?

## 2019-06-17 NOTE — ED Provider Notes (Signed)
[facility] EMERGENCY DEPARTMENT Provider Note   CSN: [id number] Arrival date & time: 06/17/19  1452     History Chief Complaint  Patient presents with   Alcohol Intoxication    Danielle Reilly is a 44 y.o. female with history of GERD, anemia presents for evaluation of acute onset, persistent generalized abdominal pain with associated nausea and vomiting.  Reports that she drank a fifth of liquor last night went to bed.  She awoke this morning with cramping, generalized abdominal pain worse in the epigastrium.  She has had several episodes of nonbloody nonbilious emesis.  No diarrhea or constipation.  She reports sharp left-sided chest pains that she has experienced previously but worsened today.  Denies shortness of breath.  No fevers or chills.  Has not tried anything for her symptoms. Attempted to drink ginger ale but could not keep it down.   She reports that she does not drink alcohol daily but does drink in a cyclical fashion where she will gradually increase the amount of alcohol that she drinks daily.  Reports that she will typically drink 20 days out of the month and will increase the amount of alcohol she drinks often.  She was seen and evaluated in the ED on 05/19/2019 for alcohol withdrawal and requesting resources for sobriety.  She was discharged with a Librium taper which she states she took to completion without drinking any alcohol and was sober for a short period of time before drinking alcohol again.  Reports her last alcoholic beverage was last night.  She notes numerous stressors between her father going missing and the recent passing of her mother.  He denies suicidal or homicidal ideations.  No auditory or visual hallucinations.  Reports she has never had alcohol withdrawal seizures or been admitted for alcohol withdrawal previously. Endorses feeling very anxious, diaphoretic, tingling of her fingertips, tremulousness in her upper extremities, and moderate  generalized headache.  She denies hallucinations auditory or visual.  She also denies suicidal or homicidal ideations.   The history is provided by the patient.       Past Medical History:  Diagnosis Date   Anemia    GERD (gastroesophageal reflux disease)    tums as needed   Hemoperitoneum 12/25/2010   SVD (spontaneous vaginal delivery)    x 1   Toothache 01/29/2013    Patient Active Problem List   Diagnosis Date Noted   Alcohol withdrawal (HCC) 06/17/2019   Hypokalemia 06/17/2019   Nausea and vomiting 06/17/2019   Tachycardia 06/17/2019   Gingivitis, acute 01/29/2013   Abnormal uterine bleeding 11/29/2012   Hemorrhagic ovarian cyst 12/26/2010    Past Surgical History:  Procedure Laterality Date   BILATERAL SALPINGECTOMY N/A 06/19/2013   Procedure: BILATERAL SALPINGECTOMY;  Surgeon: Willodean Rosenthal, MD;  Location: [facility];  Service: Gynecology;  Laterality: N/A;   DILITATION & CURRETTAGE/HYSTROSCOPY WITH NOVASURE ABLATION N/A 04/04/2013   Procedure: DILATATION & CURETTAGE/HYSTEROSCOPY WITH Attempted NOVASURE ABLATION ;  Surgeon: Willodean Rosenthal, MD;  Location: [facility];  Service: Gynecology;  Laterality: N/A;   HYSTEROSCOPY N/A 05/26/2013   Procedure: UNSUCCESSFUL HYSTEROSCOPY WITH HYDROTHERMAL ABLATION;  Surgeon: Willodean Rosenthal, MD;  Location: [facility];  Service: Gynecology;  Laterality: N/A;   LAPAROSCOPY  12/25/2010   Procedure: LAPAROSCOPY OPERATIVE;  Surgeon: Roseanna Rainbow, MD;  Location: [facility];  Service: Gynecology;  Laterality: N/A;  Operative laparoscopy /cauterization of right hemorrhagic cyst wall. Removal of belly rings.   LAPAROSCOPY FOR ECTOPIC PREGNANCY     NOVASURE  ABLATION N/A 05/26/2013   Procedure: UNSUCCESSFUL NOVASURE ABLATION;  Surgeon: Willodean Rosenthal, MD;  Location: [facility];  Service: Gynecology;  Laterality: N/A;   uterine biopsy     VAGINAL HYSTERECTOMY N/A 06/19/2013   Procedure:  TOTAL VAGINAL HYSTERECTOMY  with bilateral salpingectomy;  Surgeon: Willodean Rosenthal, MD;  Location: [facility];  Service: Gynecology;  Laterality: N/A;     OB History    Gravida  4   Para  1   Term  1   Preterm      AB  3   Living  1     SAB  1   TAB  2   Ectopic  0   Multiple      Live Births  1           Family History  Problem Relation Age of Onset   Drug abuse Mother    Arthritis Maternal Aunt    Cancer Maternal Aunt    Drug abuse Maternal Aunt    Arthritis Maternal Uncle    Alcohol abuse Maternal Uncle    Cancer Maternal Uncle    Drug abuse Maternal Uncle    Cancer Maternal Grandmother    Alcohol abuse Maternal Grandfather    Cancer Maternal Grandfather     Social History   Tobacco Use   Smoking status: Never Smoker   Smokeless tobacco: Never Used  Substance Use Topics   Alcohol use: Yes    Comment: 1 + pint of liquor per day   Drug use: Yes    Types: Benzodiazepines    Home Medications Prior to Admission medications   Medication Sig Start Date End Date Taking? Authorizing Provider  B Complex Vitamins (B COMPLEX 1 PO) Take by mouth.    [provider]  famotidine (PEPCID) 20 MG tablet Take 1 tablet (20 mg total) by mouth daily. 05/19/19 06/18/19  Jacalyn Lefevre, MD  ferrous sulfate 325 (65 FE) MG tablet Take 325 mg by mouth daily with breakfast.    [provider]  gabapentin (NEURONTIN) 300 MG capsule Take 1 capsule (300 mg total) by mouth 3 (three) times daily as needed (withdrawl). 05/19/19   Jacalyn Lefevre, MD  Multiple Vitamin (MULTIVITAMIN WITH MINERALS) TABS tablet Take 1 tablet by mouth daily.    [provider]  omeprazole (PRILOSEC) 20 MG capsule Take 1 capsule (20 mg total) by mouth daily. Patient not taking: Reported on 10/25/2018 05/24/17   Bethel Born, PA-C  ondansetron (ZOFRAN ODT) 4 MG disintegrating tablet Take 1 tablet (4 mg total) by mouth every 8 (eight) hours as needed for nausea. 05/19/19   Jacalyn Lefevre,  MD  ondansetron (ZOFRAN) 4 MG tablet Take 1 tablet (4 mg total) by mouth every 8 (eight) hours as needed. 03/09/19   Tegeler, Canary Brim, MD    Allergies    Patient has no known allergies.  Review of Systems   Review of Systems  Constitutional: Negative for chills and fever.  Respiratory: Negative for shortness of breath.   Cardiovascular: Positive for chest pain.  Gastrointestinal: Positive for abdominal pain, nausea and vomiting.  Psychiatric/Behavioral: The patient is nervous/anxious.   All other systems reviewed and are negative.   Physical Exam Updated Vital Signs BP (!) 133/91    Pulse (!) 118    Temp 98.3 F (36.8 C) (Oral)    Resp 20    Ht 5\' 4"  (1.626 m)    LMP 06/19/2013    SpO2 97%    BMI 28.32  kg/m   Physical Exam Vitals and nursing note reviewed.  Constitutional:      General: She is in acute distress.     Appearance: She is well-developed.     Comments: Sitting upright in bed, appears anxious, actively dry heaving  HENT:     Head: Normocephalic and atraumatic.  Eyes:     General:        Right eye: No discharge.        Left eye: No discharge.     Conjunctiva/sclera: Conjunctivae normal.  Neck:     Vascular: No JVD.     Trachea: No tracheal deviation.  Cardiovascular:     Rate and Rhythm: Regular rhythm. Tachycardia present.     Pulses: Normal pulses.     Heart sounds: Normal heart sounds.  Pulmonary:     Effort: Pulmonary effort is normal.     Comments: Left anterior chest wall with mild tenderness to palpation, no deformity, crepitus, ecchymosis, or flail segment. Chest:     Chest wall: Tenderness present.  Abdominal:     General: Abdomen is flat. Bowel sounds are normal. There is no distension.     Palpations: Abdomen is soft.     Tenderness: There is generalized abdominal tenderness and tenderness in the epigastric area. There is no right CVA tenderness, left CVA tenderness, guarding or rebound.     Comments: Maximally tender to palpation in the  epigastrium  Skin:    General: Skin is warm and dry.     Findings: No erythema.  Neurological:     Mental Status: She is alert.     Comments: Tremulousness of the bilateral upper extremities both with rest and with extension against gravity.  Psychiatric:        Mood and Affect: Mood is anxious.     ED Results / Procedures / Treatments   Labs (all labs ordered are listed, but only abnormal results are displayed) Labs Reviewed  COMPREHENSIVE METABOLIC PANEL - Abnormal; Notable for the following components:      Result Value   Potassium 3.1 (*)    CO2 17 (*)    Glucose, Bld 162 (*)    Calcium 8.8 (*)    AST 60 (*)    Anion gap 20 (*)    All other components within normal limits  ETHANOL - Abnormal; Notable for the following components:   Alcohol, Ethyl (B) 152 (*)    All other components within normal limits  RAPID URINE DRUG SCREEN, HOSP PERFORMED - Abnormal; Notable for the following components:   Benzodiazepines POSITIVE (*)    All other components within normal limits  SARS CORONAVIRUS 2 (TAT 6-24 HRS)  CBC  LIPASE, BLOOD  MAGNESIUM  I-STAT BETA HCG BLOOD, ED ([facility], [facility], [facility] ONLY)    EKG EKG Interpretation  Date/Time:  Tuesday June 17 2019 14:58:30 EST Ventricular Rate:  136 PR Interval:  156 QRS Duration: 84 QT Interval:  316 QTC Calculation: 475 R Axis:   79 Text Interpretation: Sinus tachycardia ST & T wave abnormality, consider inferior ischemia ST & T wave abnormality, consider anterolateral ischemia Abnormal ECG Since last tracing rate faster Confirmed by Wandra Arthurs [phone]) on 06/17/2019 4:51:52 PM   Radiology DG Chest 2 View  Result Date: 06/17/2019 CLINICAL DATA:  Chest pain for 1 day, history of anemia and GERD EXAM: CHEST - 2 VIEW COMPARISON:  Radiograph 10/25/2018 FINDINGS: No consolidation, features of edema, pneumothorax, or effusion. Pulmonary vascularity is normally distributed. The cardiomediastinal contours  are unremarkable. No acute osseous or  soft tissue abnormality. IMPRESSION: No acute cardiopulmonary abnormality. Electronically Signed   By: Kreg Shropshire M.D.   On: 06/17/2019 18:49   CT ABDOMEN PELVIS W CONTRAST  Result Date: 06/17/2019 CLINICAL DATA:  44 year old female with abdominal pain. Concern for acute pancreatitis. EXAM: CT ABDOMEN AND PELVIS WITH CONTRAST TECHNIQUE: Multidetector CT imaging of the abdomen and pelvis was performed using the standard protocol following bolus administration of intravenous contrast. CONTRAST:  OMNIPAQUE IOHEXOL 300 MG/ML  SOLN COMPARISON:  CT abdomen pelvis dated 12/25/2010. FINDINGS: Lower chest: The visualized lung bases are clear. No intra-abdominal free air or free fluid. Hepatobiliary: Advanced fatty infiltration of the liver. No intrahepatic biliary ductal dilatation. The gallbladder is unremarkable. Pancreas: Unremarkable. No pancreatic ductal dilatation or surrounding inflammatory changes. Spleen: Normal in size without focal abnormality. Adrenals/Urinary Tract: The adrenal glands are unremarkable. The kidneys, visualized ureters, and urinary bladder appear unremarkable as well. Stomach/Bowel: There is no bowel obstruction or active inflammation. The appendix is normal. Vascular/Lymphatic: The abdominal aorta and IVC are unremarkable. No portal venous gas. There is a 12 mm rounded lymph node to the left of the abdominal aorta (series 3, image 42) of indeterminate etiology or clinical significance. Clinical correlation and attention on follow-up recommended. Reproductive: Hysterectomy. Dominant 15 mm right adnexal follicle/cyst. Other: None Musculoskeletal: No acute or significant osseous findings. IMPRESSION: 1. No acute intra-abdominal or pelvic pathology. No bowel obstruction. Normal appendix. 2. Fatty liver. 3. A 12 mm rounded lymph node to the left of the abdominal aorta of indeterminate etiology or clinical significance. Clinical correlation and attention on follow-up recommended.  Electronically Signed   By: Elgie Collard M.D.   On: 06/17/2019 19:28    Procedures .Critical Care Performed by: Jeanie Sewer, PA-C Authorized by: Jeanie Sewer, PA-C   Critical care provider statement:    Critical care time (minutes):  45   Critical care was necessary to treat or prevent imminent or life-threatening deterioration of the following conditions:  Toxidrome   Critical care was time spent personally by me on the following activities:  Discussions with consultants, evaluation of patient's response to treatment, examination of patient, ordering and performing treatments and interventions, ordering and review of laboratory studies, ordering and review of radiographic studies, pulse oximetry, re-evaluation of patient's condition, obtaining history from patient or surrogate and review of old charts   (including critical care time)  Medications Ordered in ED Medications  LORazepam (ATIVAN) injection 0-4 mg (2 mg Intravenous Given 06/17/19 1801)    Or  LORazepam (ATIVAN) tablet 0-4 mg ( Oral See Alternative 06/17/19 1801)  LORazepam (ATIVAN) injection 0-4 mg (has no administration in time range)    Or  LORazepam (ATIVAN) tablet 0-4 mg (has no administration in time range)  thiamine tablet 100 mg (100 mg Oral Given 06/17/19 1809)    Or  thiamine (B-1) injection 100 mg ( Intravenous See Alternative 06/17/19 1809)  sodium chloride 0.9 % bolus 1,000 mL (1,000 mLs Intravenous New Bag/Given 06/17/19 1801)  potassium chloride SA (KLOR-CON) CR tablet 40 mEq (40 mEq Oral Given 06/17/19 1802)  ondansetron (ZOFRAN) injection 4 mg (4 mg Intravenous Given 06/17/19 1801)  iohexol (OMNIPAQUE) 300 MG/ML solution 100 mL (100 mLs Intravenous Contrast Given 06/17/19 1850)  sodium chloride 0.9 % bolus 1,000 mL (1,000 mLs Intravenous New Bag/Given 06/17/19 1950)  alum & mag hydroxide-simeth (MAALOX/MYLANTA) 200-200-20 MG/5ML suspension 30 mL (30 mLs Oral Given 06/17/19 1950)    And  lidocaine (  XYLOCAINE) 2 %  viscous mouth solution 15 mL (15 mLs Oral Given 06/17/19 1950)    ED Course  I have reviewed the triage vital signs and the nursing notes.  Pertinent labs & imaging results that were available during my care of the patient were reviewed by me and considered in my medical decision making (see chart for details).    MDM Rules/Calculators/A&P                      Patient presenting for evaluation of generalized abdominal pains, nausea and vomiting.  She is afebrile, persistently tachycardic in the ED up to the low 140s on initial evaluation.  She appears anxious and restless, frequently fidgeting in the room.  She is actively vomiting on my initial assessment.  She has generalized abdominal tenderness to palpation worst in the epigastrium but no rebound or guarding.  She also complains of left-sided chest pains but these are reproducible on palpation and sound quite atypical; I doubt they are cardiac in etiology.  Her EKG shows sinus tachycardia.  Her ethanol level which was obtained in triage is 152, however she reports that her last alcoholic beverage was last night.  She does appear to be withdrawing from alcohol and her initial CIWA is 22.  She was put on CIWA protocol and given IV fluids, antiemetics, Ativan.  Remainder of lab work reviewed and interpreted by myself shows no leukocytosis, no anemia, hypokalemia with potassium of 3.1 but magnesium is within normal limits.  CMP is suggestive of alcoholic ketoacidosis with low bicarb, elevated anion gap worse than labs obtained last month.  AST to ALT ratio suggestive of alcohol abuse.  Imaging of the abdomen was obtained which shows no acute surgical abdominal pathology, no evidence of obstruction, perforation, appendicitis, cholecystitis, diverticulitis, or pancreatitis.  On reevaluation the patient remains tachycardic, anxious.  Her CIWA score has improved to 14.   Given her high initial CIWA score, she is at risk of delirium tremens and alcohol  withdrawal seizures.  Feel she would benefit from admission to the hospital for further observation and management.  Spoke with Dr. Morene Antu with [facility] hospitalist service who agrees to assume.  Patient returned to the hospital for further evaluation and management.  Patient seen and evaluated by Dr. Silverio Lay who agrees with assessment and plan at this time.   Final Clinical Impression(s) / ED Diagnoses Final diagnoses:  Alcohol withdrawal syndrome with complication [facility])  Hypokalemia    Rx / DC Orders ED Discharge Orders    None       Bennye Alm 06/17/19 2120    Charlynne Pander, MD 06/17/19 2217

## 2019-06-17 NOTE — H&P (Signed)
[facility] History and Physical  Danielle Reilly YDX:[id number] DOB: Mar 19, 1976 DOA: 06/17/2019  Referring physician: Nils Flack - ED PA PCP: System, Pcp Not In   Chief Complaint: abdominal pain with nausea/vomiting   HPI: Danielle Reilly is a 44 y.o. female with PMH of GERD who presented to ER with abdominal pain along with nausea and vomiting and admitted under observation for alcohol withdrawal.  Patient reports that she started drinking in her early 27's. Her father and sister also struggle with alcoholism. She reports drinking at times "heavily" over the past several years, but that she began drinking more starting in April 25, 2023 after her mother died. Yesterday she drank a fifth of liquor before going to bed last night; last drink possibly around 2300. She reports "this is it" and that she "is done." She does not drink daily, but does drink most days. Sometimes she will stop and go 7-8 days without drinking. Denies history of DT's or withdrawal seizures. Her son brought her to the ED for nausea, vomiting and abdominal pain. Reports waking up around 0900 this morning and that she has had vague abdominal pain with nausea and vomiting. She has been unable to keep down any PO all day. Denies blood in vomit. Abdominal pain mainly located in epigastric region and reports some burning in her throat and over left side of chest. Denies SI/HI or history of this. She has not tried anything for her nausea/vomiting/pain today. Denies fever, chills, cough, SOB, diarrhea, constipation, dysuria, hematuria, hematochezia, melena, difficulty moving arms/legs, speech difficulty, trouble eating, confusion or any other complaints.  In the ED: Vitals stable with tachycardia to 130's and mild-range BP's. CMP with K 3.1 and AST 60. Anion gap 20 and CO2 17. Ethanol level 152. CBC WNL. UDS positive for benzos only. Lipase and Magnesium WNL. Patient was given Maalox, Ativan, Zofran, 40 mEq Kdur and thiamine. Received 2 L IVF. She failed  her PO challenge. Called for admission as ED provider felt patient needed further observation due to tachycardia and unable to tolerate PO. EKG: Independently reviewed. HR 130, sinus tachycardia. QTc 475. No STEMI. CXR without acute cardiopulmonary abnormality. CT Abd/Pelv: IMPRESSION: 1. No acute intra-abdominal or pelvic pathology. No bowel obstruction. Normal appendix. 2. Fatty liver. 3. A 12 mm rounded lymph node to the left of the abdominal aorta of indeterminate etiology or clinical significance. Clinical correlation and attention on follow-up recommended.  Review of Systems:  Constitutional:  No weight loss, night sweats, Fevers, chills, fatigue.  HEENT:  No headaches, Difficulty swallowing,Tooth/dental problems,Sore throat,  No sneezing, itching, ear ache, nasal congestion, post nasal drip,  Cardio-vascular:  No Orthopnea, PND, swelling in lower extremities, anasarca, dizziness Left-sided chest pain and palpitations present. GI:  Positive for indigestion, abdominal pain, nausea, vomiting. Unable to tolerate PO. Negative for diarrhea, change in bowel habits. Resp:  No shortness of breath with exertion or at rest. No excess mucus, no productive cough, No non-productive cough, No coughing up of blood.No change in color of mucus.No wheezing.No chest wall deformity  Skin:  no rash or lesions.  GU:  no dysuria, change in color of urine, no urgency or frequency. No flank pain.  Musculoskeletal:  No joint pain or swelling. No decreased range of motion. No back pain.  Psych:  No change in mood or affect. No depression or anxiety. No memory loss.   Past Medical History:  Diagnosis Date  . Anemia   . GERD (gastroesophageal reflux disease)    tums as needed  . Hemoperitoneum  12/25/2010  . SVD (spontaneous vaginal delivery)    x 1  . Toothache 01/29/2013   Past Surgical History:  Procedure Laterality Date  . BILATERAL SALPINGECTOMY N/A 06/19/2013   Procedure: BILATERAL  SALPINGECTOMY;  Surgeon: Willodean Rosenthal, MD;  Location: [facility];  Service: Gynecology;  Laterality: N/A;  . DILITATION & CURRETTAGE/HYSTROSCOPY WITH NOVASURE ABLATION N/A 04/04/2013   Procedure: DILATATION & CURETTAGE/HYSTEROSCOPY WITH Attempted NOVASURE ABLATION ;  Surgeon: Willodean Rosenthal, MD;  Location: [facility];  Service: Gynecology;  Laterality: N/A;  . HYSTEROSCOPY N/A 05/26/2013   Procedure: UNSUCCESSFUL HYSTEROSCOPY WITH HYDROTHERMAL ABLATION;  Surgeon: Willodean Rosenthal, MD;  Location: [facility];  Service: Gynecology;  Laterality: N/A;  . LAPAROSCOPY  12/25/2010   Procedure: LAPAROSCOPY OPERATIVE;  Surgeon: Roseanna Rainbow, MD;  Location: [facility];  Service: Gynecology;  Laterality: N/A;  Operative laparoscopy /cauterization of right hemorrhagic cyst wall. Removal of belly rings.  Marland Kitchen LAPAROSCOPY FOR ECTOPIC PREGNANCY    . NOVASURE ABLATION N/A 05/26/2013   Procedure: UNSUCCESSFUL NOVASURE ABLATION;  Surgeon: Willodean Rosenthal, MD;  Location: [facility];  Service: Gynecology;  Laterality: N/A;  . uterine biopsy    . VAGINAL HYSTERECTOMY N/A 06/19/2013   Procedure:  TOTAL VAGINAL HYSTERECTOMY with bilateral salpingectomy;  Surgeon: Willodean Rosenthal, MD;  Location: [facility];  Service: Gynecology;  Laterality: N/A;   Social History:  reports that she has never smoked. She has never used smokeless tobacco. She reports current alcohol use. She reports current drug use. Drug: Benzodiazepines.  No Known Allergies  Family History  Problem Relation Age of Onset  . Drug abuse Mother   . Arthritis Maternal Aunt   . Cancer Maternal Aunt   . Drug abuse Maternal Aunt   . Arthritis Maternal Uncle   . Alcohol abuse Maternal Uncle   . Cancer Maternal Uncle   . Drug abuse Maternal Uncle   . Cancer Maternal Grandmother   . Alcohol abuse Maternal Grandfather   . Cancer Maternal Grandfather      Prior to Admission medications   Medication Sig Start Date End Date Taking? Authorizing  Provider  B Complex Vitamins (B COMPLEX 1 PO) Take by mouth.    [provider]  famotidine (PEPCID) 20 MG tablet Take 1 tablet (20 mg total) by mouth daily. 05/19/19 06/18/19  Jacalyn Lefevre, MD  ferrous sulfate 325 (65 FE) MG tablet Take 325 mg by mouth daily with breakfast.    [provider]  gabapentin (NEURONTIN) 300 MG capsule Take 1 capsule (300 mg total) by mouth 3 (three) times daily as needed (withdrawl). 05/19/19   Jacalyn Lefevre, MD  Multiple Vitamin (MULTIVITAMIN WITH MINERALS) TABS tablet Take 1 tablet by mouth daily.    [provider]  omeprazole (PRILOSEC) 20 MG capsule Take 1 capsule (20 mg total) by mouth daily. Patient not taking: Reported on 10/25/2018 05/24/17   Bethel Born, PA-C  ondansetron (ZOFRAN ODT) 4 MG disintegrating tablet Take 1 tablet (4 mg total) by mouth every 8 (eight) hours as needed for nausea. 05/19/19   Jacalyn Lefevre, MD  ondansetron (ZOFRAN) 4 MG tablet Take 1 tablet (4 mg total) by mouth every 8 (eight) hours as needed. 03/09/19   Tegeler, Canary Brim, MD   Physical Exam: Vitals:   06/17/19 1730 06/17/19 1745 06/17/19 1800 06/17/19 1830  BP: (!) 139/95 (!) 133/91    Pulse: (!) 122 (!) 117 (!) 118   Resp: (!) 26 (!) 22 (!) 23 20  Temp:      TempSrc:  SpO2: 98% 98% 97%   Height:        Wt Readings from Last 3 Encounters:  03/09/19 74.8 kg  01/30/19 76.4 kg  02/06/18 72.6 kg    General:  Appears calm and comfortable Eyes: EOMI, normal lids, irises & conjunctiva ENT: grossly normal hearing, lips & tongue Neck: no LAD, masses or thyromegaly Cardiovascular: Tachycardic, no m/r/g. No LE edema. Chest Wall: Reproducible chest pain with palpation of left chest wall. Respiratory: CTA bilaterally, no w/r/r. Normal respiratory effort. Abdomen: soft, ntnd Skin: no rash or induration seen on limited exam Musculoskeletal: grossly normal tone BUE/BLE Psychiatric: grossly normal mood and affect, speech fluent and  appropriate Neurologic: grossly non-focal. Tremulous upper extremities           Labs on Admission:  Basic Metabolic Panel: Recent Labs  Lab 06/17/19 1509 06/17/19 1741  NA 136  --   K 3.1*  --   CL 99  --   CO2 17*  --   GLUCOSE 162*  --   BUN 9  --   CREATININE 0.77  --   CALCIUM 8.8*  --   MG  --  2.0   Liver Function Tests: Recent Labs  Lab 06/17/19 1509  AST 60*  ALT 30  ALKPHOS 59  BILITOT 1.0  PROT 7.7  ALBUMIN 4.4   Recent Labs  Lab 06/17/19 1741  LIPASE 26   No results for input(s): AMMONIA in the last 168 hours. CBC: Recent Labs  Lab 06/17/19 1509  WBC 6.1  HGB 12.3  HCT 38.3  MCV 88.9  PLT 212   Cardiac Enzymes: No results for input(s): CKTOTAL, CKMB, CKMBINDEX, TROPONINI in the last 168 hours.  BNP (last 3 results) No results for input(s): BNP in the last 8760 hours.  ProBNP (last 3 results) No results for input(s): PROBNP in the last 8760 hours.  CBG: No results for input(s): GLUCAP in the last 168 hours.  Radiological Exams on Admission: DG Chest 2 View  Result Date: 06/17/2019 CLINICAL DATA:  Chest pain for 1 day, history of anemia and GERD EXAM: CHEST - 2 VIEW COMPARISON:  Radiograph 10/25/2018 FINDINGS: No consolidation, features of edema, pneumothorax, or effusion. Pulmonary vascularity is normally distributed. The cardiomediastinal contours are unremarkable. No acute osseous or soft tissue abnormality. IMPRESSION: No acute cardiopulmonary abnormality. Electronically Signed   By: Kreg Shropshire M.D.   On: 06/17/2019 18:49   CT ABDOMEN PELVIS W CONTRAST  Result Date: 06/17/2019 CLINICAL DATA:  44 year old female with abdominal pain. Concern for acute pancreatitis. EXAM: CT ABDOMEN AND PELVIS WITH CONTRAST TECHNIQUE: Multidetector CT imaging of the abdomen and pelvis was performed using the standard protocol following bolus administration of intravenous contrast. CONTRAST:  OMNIPAQUE IOHEXOL 300 MG/ML  SOLN COMPARISON:  CT abdomen  pelvis dated 12/25/2010. FINDINGS: Lower chest: The visualized lung bases are clear. No intra-abdominal free air or free fluid. Hepatobiliary: Advanced fatty infiltration of the liver. No intrahepatic biliary ductal dilatation. The gallbladder is unremarkable. Pancreas: Unremarkable. No pancreatic ductal dilatation or surrounding inflammatory changes. Spleen: Normal in size without focal abnormality. Adrenals/Urinary Tract: The adrenal glands are unremarkable. The kidneys, visualized ureters, and urinary bladder appear unremarkable as well. Stomach/Bowel: There is no bowel obstruction or active inflammation. The appendix is normal. Vascular/Lymphatic: The abdominal aorta and IVC are unremarkable. No portal venous gas. There is a 12 mm rounded lymph node to the left of the abdominal aorta (series 3, image 42) of indeterminate etiology or clinical significance. Clinical correlation  and attention on follow-up recommended. Reproductive: Hysterectomy. Dominant 15 mm right adnexal follicle/cyst. Other: None Musculoskeletal: No acute or significant osseous findings. IMPRESSION: 1. No acute intra-abdominal or pelvic pathology. No bowel obstruction. Normal appendix. 2. Fatty liver. 3. A 12 mm rounded lymph node to the left of the abdominal aorta of indeterminate etiology or clinical significance. Clinical correlation and attention on follow-up recommended. Electronically Signed   By: Elgie Collard M.D.   On: 06/17/2019 19:28    EKG: Independently reviewed. HR 130, sinus tachycardia. QTc 475. No STEMI.  Assessment/Plan Principal Problem:   Alcohol withdrawal (HCC) Active Problems:   Hypokalemia   Nausea and vomiting   Tachycardia   Increased anion gap metabolic acidosis   GERD (gastroesophageal reflux disease)  44 y.o. female with PMH of GERD who presented to ER with abdominal pain along with nausea and vomiting and admitted under observation for alcohol withdrawal.   1. Alcoholism/Alcohol Withdrawal -  Ethanol level 152 on admission - No hx of DT's or withdrawal seizures - Family hx of alcoholism - Symptoms exacerbated by recent death of mother - Patient desires to quit; SW consulted to aid with resources - CIWA protocol with Ativan - Will give another 20 mEq Kdur; repeat CMP in AM for resolution of hypokalemia and improving anion gap - Regular diet as tolerated; currently unable to tolerate PO - Zofran PRN - Cont Thiamine, folate and Multivitamins  - S/p 2L IVF in ED - Observation admit with Telemetry  - Check phosphorus level - Cont Pepcid for GERD which is likely exacerbated in setting of nausea/vomiting with alcohol withdrawal; add PPI PRN  Code Status: Full DVT Prophylaxis: Lovenox Family Communication: None Disposition Plan: Admit to Observation with telemetry for acute alcohol withdrawal. If labs normalize and patient clinically and symptomatically improving, can likely discharge home 3/3.  Time spent: 45 minutes  Joselyn Arrow [facility] Pager [phone]

## 2019-06-18 DIAGNOSIS — F10230 Alcohol dependence with withdrawal, uncomplicated: Secondary | ICD-10-CM

## 2019-06-18 LAB — COMPREHENSIVE METABOLIC PANEL
ALT: 27 U/L (ref 0–44)
AST: 49 U/L — ABNORMAL HIGH (ref 15–41)
Albumin: 3.9 g/dL (ref 3.5–5.0)
Alkaline Phosphatase: 51 U/L (ref 38–126)
Anion gap: 12 (ref 5–15)
BUN: 6 mg/dL (ref 6–20)
CO2: 22 mmol/L (ref 22–32)
Calcium: 8.4 mg/dL — ABNORMAL LOW (ref 8.9–10.3)
Chloride: 100 mmol/L (ref 98–111)
Creatinine, Ser: 0.78 mg/dL (ref 0.44–1.00)
GFR calc Af Amer: >60 mL/min (ref >60)
GFR calc non Af Amer: >60 mL/min (ref >60)
Glucose, Bld: 107 mg/dL — ABNORMAL HIGH (ref 70–99)
Potassium: 3.2 mmol/L — ABNORMAL LOW (ref 3.5–5.1)
Sodium: 134 mmol/L — ABNORMAL LOW (ref 135–145)
Total Bilirubin: 1.9 mg/dL — ABNORMAL HIGH (ref 0.3–1.2)
Total Protein: 6.8 g/dL (ref 6.5–8.1)

## 2019-06-18 LAB — PHOSPHORUS: Phosphorus: 2.8 mg/dL (ref 2.5–4.6)

## 2019-06-18 LAB — HIV ANTIBODY (ROUTINE TESTING W REFLEX): HIV Screen 4th Generation wRfx: NONREACTIVE

## 2019-06-18 LAB — SARS CORONAVIRUS 2 (TAT 6-24 HRS): SARS Coronavirus 2: NEGATIVE

## 2019-06-18 MED ORDER — GABAPENTIN 300 MG PO CAPS
300.0000 mg | ORAL_CAPSULE | Freq: Three times a day (TID) | ORAL | Status: DC | PRN
  Administered 2019-06-18 – 2019-06-19 (×4): 300 mg via ORAL
  Filled 2019-06-18 (×4): qty 1

## 2019-06-18 MED ORDER — ADULT MULTIVITAMIN W/MINERALS CH
1.0000 | ORAL_TABLET | Freq: Every day | ORAL | Status: DC
  Administered 2019-06-18 – 2019-06-20 (×3): 1 via ORAL
  Filled 2019-06-18 (×3): qty 1

## 2019-06-18 MED ORDER — FOLIC ACID 1 MG PO TABS
1.0000 mg | ORAL_TABLET | Freq: Every day | ORAL | Status: DC
  Administered 2019-06-18 – 2019-06-20 (×3): 1 mg via ORAL
  Filled 2019-06-18 (×3): qty 1

## 2019-06-18 MED ORDER — POTASSIUM CHLORIDE CRYS ER 20 MEQ PO TBCR
40.0000 meq | EXTENDED_RELEASE_TABLET | ORAL | Status: AC
  Administered 2019-06-18 (×2): 40 meq via ORAL
  Filled 2019-06-18 (×2): qty 2

## 2019-06-18 MED ORDER — ENOXAPARIN SODIUM 40 MG/0.4ML ~~LOC~~ SOLN
40.0000 mg | SUBCUTANEOUS | Status: DC
  Administered 2019-06-18: 40 mg via SUBCUTANEOUS
  Filled 2019-06-18: qty 0.4

## 2019-06-18 MED ORDER — FAMOTIDINE 20 MG PO TABS
20.0000 mg | ORAL_TABLET | Freq: Every day | ORAL | Status: DC
  Administered 2019-06-18 – 2019-06-20 (×3): 20 mg via ORAL
  Filled 2019-06-18 (×4): qty 1

## 2019-06-18 MED ORDER — ENOXAPARIN SODIUM 40 MG/0.4ML ~~LOC~~ SOLN
40.0000 mg | SUBCUTANEOUS | Status: DC
  Administered 2019-06-19: 40 mg via SUBCUTANEOUS
  Filled 2019-06-18 (×2): qty 0.4

## 2019-06-18 MED ORDER — ONDANSETRON HCL 4 MG/2ML IJ SOLN
4.0000 mg | Freq: Four times a day (QID) | INTRAMUSCULAR | Status: DC | PRN
  Administered 2019-06-18 – 2019-06-19 (×2): 4 mg via INTRAVENOUS
  Filled 2019-06-18 (×2): qty 2

## 2019-06-18 NOTE — Progress Notes (Signed)
Patient admitted to the floor at 0045. Patient was not able to find cell phone and states that she left it on the stretcher. The NT who escorted the patient to the unit has placed patient's linens from the stretcher in a linen bag and sent it down the linen chute. RN made laundry aware and was advised to come retreive it from the lost and found. RN will make oncoming nurse aware.  Veatrice Kells, RN

## 2019-06-18 NOTE — TOC Initial Note (Addendum)
Transition of Care [facility]) - Initial/Assessment Note    Patient Details  Name: Danielle Reilly MRN: [id number] Date of Birth: 1976/02/06  Transition of Care [facility]) CM/SW Contact:    Marilu Favre, RN Phone Number: 06/18/2019, 2:58 PM  Clinical Narrative:                 Patient from home with minor son.   Provided ETOH resources.   Discussed TOC pharmacy , patient wants to use [facility] pharmacy at discharge. Will continue to follow and provide assistance with medications and schedule follow up appointment . [facility] March 17 , 2021 at 2:30 pm  Expected Discharge Plan: Home/Self Care Barriers to Discharge: Continued Medical Work up   Patient Goals and CMS Choice Patient states their goals for this hospitalization and ongoing recovery are:: to return to home CMS Medicare.gov Compare Post Acute Care list provided to:: Patient Choice offered to / list presented to : NA  Expected Discharge Plan and Services Expected Discharge Plan: Home/Self Care In-house Referral: PCP / [facility], Financial Counselor Discharge Planning Services: CM Consult, Follow-up appt scheduled, [facility], Medication Assistance, [facility]   Living arrangements for the past 2 months: Apartment                 DME Arranged: N/A DME Agency: NA       HH Arranged: NA          Prior Living Arrangements/Services Living arrangements for the past 2 months: Apartment Lives with:: Minor Children(son) Patient language and need for interpreter reviewed:: Yes Do you feel safe going back to the place where you live?: Yes      Need for Family Participation in Patient Care: Yes (Comment) Care giver support system in place?: Yes (comment)   Criminal Activity/Legal Involvement Pertinent to Current Situation/Hospitalization: No - Comment as needed  Activities of Daily Living Home Assistive Devices/Equipment: Eyeglasses ADL Screening (condition at time of admission) Patient's  cognitive ability adequate to safely complete daily activities?: Yes Is the patient deaf or have difficulty hearing?: No Does the patient have difficulty seeing, even when wearing glasses/contacts?: No Does the patient have difficulty concentrating, remembering, or making decisions?: No Patient able to express need for assistance with ADLs?: Yes Does the patient have difficulty dressing or bathing?: No Independently performs ADLs?: Yes (appropriate for developmental age) Does the patient have difficulty walking or climbing stairs?: No Weakness of Legs: None Weakness of Arms/Hands: None  Permission Sought/Granted   Permission granted to share information with : No              Emotional Assessment Appearance:: Appears stated age Attitude/Demeanor/Rapport: Engaged Affect (typically observed): Accepting Orientation: : Oriented to Situation, Oriented to  Time, Oriented to Place, Oriented to Self Alcohol / Substance Use: Not Applicable Psych Involvement: No (comment)  Admission diagnosis:  Alcohol withdrawal ([place]) [F10.239] Hypokalemia [E87.6] Alcohol withdrawal syndrome with complication [facility]) [I69.629] Patient Active Problem List   Diagnosis Date Noted  . Alcohol withdrawal ([place]) 06/17/2019  . Hypokalemia 06/17/2019  . Nausea and vomiting 06/17/2019  . Tachycardia 06/17/2019  . Increased anion gap metabolic acidosis 52/84/1324  . GERD (gastroesophageal reflux disease) 06/17/2019  . Gingivitis, acute 01/29/2013  . Abnormal uterine bleeding 11/29/2012  . Hemorrhagic ovarian cyst 12/26/2010   PCP:  System, Pcp Not In Pharmacy:   [place] ([place]), [place] - [address] [place] [address] Phone: [phone] Fax: [phone]  [facility] - [place], [place] - [address] [place] [address] Phone: [phone] Fax: [phone]     Social Determinants  of Health (SDOH) Interventions    Readmission Risk Interventions No flowsheet data found.

## 2019-06-18 NOTE — Progress Notes (Signed)
ANTICOAGULATION CONSULT NOTE - Initial Consult  Pharmacy Consult for enoxaparin Indication: VTE prophyaxis  No Known Allergies  Patient Measurements: Height: 5\' 4"  (162.6 cm) Weight: 169 lb 5 oz (76.8 kg) IBW/kg (Calculated) : 54.7   Vital Signs: Temp: 98 F (36.7 C) (03/03 0900) Temp Source: Oral (03/03 0900) BP: 107/69 (03/03 0900) Pulse Rate: 109 (03/03 0900)  Labs: Recent Labs    06/17/19 1509 06/18/19 0440  HGB 12.3  --   HCT 38.3  --   PLT 212  --   CREATININE 0.77 0.78    Estimated Creatinine Clearance: 90.9 mL/min (by C-G formula based on SCr of 0.78 mg/dL).   Medical History: Past Medical History:  Diagnosis Date  . Anemia   . GERD (gastroesophageal reflux disease)    tums as needed  . Hemoperitoneum 12/25/2010  . SVD (spontaneous vaginal delivery)    x 1  . Toothache 01/29/2013   Assessment: 44 yo female admitted with ETOH withdrawal. CrCl>60 ml/min, BMI 29. Will start enoxaparin for VTE prophylaxis. No bleeding noted  Goal of Therapy:  VTE prophylaxis Monitor platelets by anticoagulation protocol: Yes   Plan:  Enoxaparin 40mg  SQ q24h Monitor for renal function and bleeding. Pharmacy will sign off.    Ariannie Penaloza A. 55, PharmD, BCPS, FNKF Clinical Pharmacist [place] Please utilize Amion for appropriate phone number to reach the unit pharmacist [facility])   06/18/2019,1:15 PM

## 2019-06-18 NOTE — Progress Notes (Signed)
PROGRESS NOTE    Danielle Reilly  DUK:[id number] DOB: Jul 29, 1975 DOA: 06/17/2019 PCP: System, Pcp Not In   Brief Narrative:  HPI: Danielle Reilly is a 44 y.o. female with PMH of GERD who presented to ER with abdominal pain along with nausea and vomiting and admitted under observation for alcohol withdrawal.  Patient reports that she started drinking in her early 3's. Her father and sister also struggle with alcoholism. She reports drinking at times "heavily" over the past several years, but that she began drinking more starting in 04/06/2023 after her mother died. Yesterday she drank a fifth of liquor before going to bed last night; last drink possibly around 2300. She reports "this is it" and that she "is done." She does not drink daily, but does drink most days. Sometimes she will stop and go 7-8 days without drinking. Denies history of DT's or withdrawal seizures. Her son brought her to the ED for nausea, vomiting and abdominal pain. Reports waking up around 0900 this morning and that she has had vague abdominal pain with nausea and vomiting. She has been unable to keep down any PO all day. Denies blood in vomit. Abdominal pain mainly located in epigastric region and reports some burning in her throat and over left side of chest. Denies SI/HI or history of this. She has not tried anything for her nausea/vomiting/pain today. Denies fever, chills, cough, SOB, diarrhea, constipation, dysuria, hematuria, hematochezia, melena, difficulty moving arms/legs, speech difficulty, trouble eating, confusion or any other complaints.  In the ED: Vitals stable with tachycardia to 130's and mild-range BP's. CMP with K 3.1 and AST 60. Anion gap 20 and CO2 17. Ethanol level 152. CBC WNL. UDS positive for benzos only. Lipase and Magnesium WNL. Patient was given Maalox, Ativan, Zofran, 40 mEq Kdur and thiamine. Received 2 L IVF. She failed her PO challenge. Called for admission as ED provider felt patient needed further observation  due to tachycardia and unable to tolerate PO.  Assessment & Plan:   Principal Problem:   Alcohol withdrawal (HCC) Active Problems:   Hypokalemia   Nausea and vomiting   Tachycardia   Increased anion gap metabolic acidosis   GERD (gastroesophageal reflux disease)   Nausea/vomiting/abdominal pain/alcohol withdrawal: Patient symptoms are likely secondary to alcohol withdrawal.  Patient has received couple of doses of Ativan yesterday.  Her CIWA most recently was 12.  CT abdomen negative for acute pathology.  Continues to have nausea but no vomiting.  Continue CIWA protocol with as needed Ativan and multivitamins.  Hypokalemia: 3.2.  Will replace orally.  Recheck in the morning.  DVT prophylaxis: Lovenox Code Status: Full code Family Communication:  None present at bedside.  Plan of care discussed with patient in length and he verbalized understanding and agreed with it. Patient is from: Home Disposition Plan: Home Barriers to discharge: Active alcohol withdrawal needs treatment   Estimated body mass index is 29.06 kg/m as calculated from the following:   Height as of this encounter: 5\' 4"  (1.626 m).   Weight as of this encounter: 76.8 kg.      Nutritional status:               Consultants:   None  Procedures:   None  Antimicrobials:   None   Subjective: Seen and examined.  Complains of anxiety and nausea.  No other complaint.  Objective: Vitals:   06/18/19 0047 06/18/19 0050 06/18/19 0451 06/18/19 0900  BP: 124/69  116/74 107/69  Pulse: (!) 114  08/18/19)  108 (!) 109  Resp: 17  18 18   Temp: 98.1 F (36.7 C)  98.2 F (36.8 C) 98 F (36.7 C)  TempSrc: Oral  Oral Oral  SpO2: 97%  98% 99%  Weight:  76.8 kg    Height:  5\' 4"  (1.626 m)      Intake/Output Summary (Last 24 hours) at 06/18/2019 1213 Last data filed at 06/18/2019 0900 Gross per 24 hour  Intake 2720 ml  Output --  Net 2720 ml   Filed Weights   06/18/19 0050  Weight: 76.8 kg     Examination:  General exam: Appears low Respiratory system: Clear to auscultation. Respiratory effort normal. Cardiovascular system: S1 & S2 heard, sinus tachycardia. No JVD, murmurs, rubs, gallops or clicks. No pedal edema. Gastrointestinal system: Abdomen is nondistended, soft and nontender. No organomegaly or masses felt. Normal bowel sounds heard. Central nervous system: Alert and oriented. No focal neurological deficits. Extremities: Symmetric 5 x 5 power.  Fine tremors upper extremities Skin: No rashes, lesions or ulcers Psychiatry: Judgement and insight appear normal. Mood & affect depressed.   Data Reviewed: I have personally reviewed following labs and imaging studies  CBC: Recent Labs  Lab 06/17/19 1509  WBC 6.1  HGB 12.3  HCT 38.3  MCV 88.9  PLT 212   Basic Metabolic Panel: Recent Labs  Lab 06/17/19 1509 06/17/19 1741 06/18/19 0440  NA 136  --  134*  K 3.1*  --  3.2*  CL 99  --  100  CO2 17*  --  22  GLUCOSE 162*  --  107*  BUN 9  --  6  CREATININE 0.77  --  0.78  CALCIUM 8.8*  --  8.4*  MG  --  2.0  --   PHOS  --  2.8  --    GFR: Estimated Creatinine Clearance: 90.9 mL/min (by C-G formula based on SCr of 0.78 mg/dL). Liver Function Tests: Recent Labs  Lab 06/17/19 1509 06/18/19 0440  AST 60* 49*  ALT 30 27  ALKPHOS 59 51  BILITOT 1.0 1.9*  PROT 7.7 6.8  ALBUMIN 4.4 3.9   Recent Labs  Lab 06/17/19 1741  LIPASE 26   No results for input(s): AMMONIA in the last 168 hours. Coagulation Profile: No results for input(s): INR, PROTIME in the last 168 hours. Cardiac Enzymes: No results for input(s): CKTOTAL, CKMB, CKMBINDEX, TROPONINI in the last 168 hours. BNP (last 3 results) No results for input(s): PROBNP in the last 8760 hours. HbA1C: No results for input(s): HGBA1C in the last 72 hours. CBG: No results for input(s): GLUCAP in the last 168 hours. Lipid Profile: No results for input(s): CHOL, HDL, LDLCALC, TRIG, CHOLHDL, LDLDIRECT  in the last 72 hours. Thyroid Function Tests: No results for input(s): TSH, T4TOTAL, FREET4, T3FREE, THYROIDAB in the last 72 hours. Anemia Panel: No results for input(s): VITAMINB12, FOLATE, FERRITIN, TIBC, IRON, RETICCTPCT in the last 72 hours. Sepsis Labs: No results for input(s): PROCALCITON, LATICACIDVEN in the last 168 hours.  Recent Results (from the past 240 hour(s))  SARS CORONAVIRUS 2 (TAT 6-24 HRS) Nasopharyngeal Nasopharyngeal Swab     Status: None   Collection Time: 06/17/19 10:35 PM   Specimen: Nasopharyngeal Swab  Result Value Ref Range Status   SARS Coronavirus 2 NEGATIVE NEGATIVE Final    Comment: (NOTE) SARS-CoV-2 target nucleic acids are NOT DETECTED. The SARS-CoV-2 RNA is generally detectable in upper and lower respiratory specimens during the acute phase of infection. Negative results do not preclude  SARS-CoV-2 infection, do not rule out co-infections with other pathogens, and should not be used as the sole basis for treatment or other patient management decisions. Negative results must be combined with clinical observations, patient history, and epidemiological information. The expected result is Negative. Fact Sheet for Patients: SugarRoll.be Fact Sheet for Healthcare Providers: https://www.woods-mathews.com/ This test is not yet approved or cleared by the [place] FDA and  has been authorized for detection and/or diagnosis of SARS-CoV-2 by FDA under an Emergency Use Authorization (EUA). This EUA will remain  in effect (meaning this test can be used) for the duration of the COVID-19 declaration under Section 56 4(b)(1) of the Act, 21 U.S.C. section 360bbb-3(b)(1), unless the authorization is terminated or revoked sooner. Performed at [facility], [place] [address], [place], [place] [address]       Radiology Studies: DG Chest 2 View  Result Date: 06/17/2019 CLINICAL DATA:  Chest pain for 1 day, history  of anemia and GERD EXAM: CHEST - 2 VIEW COMPARISON:  Radiograph 10/25/2018 FINDINGS: No consolidation, features of edema, pneumothorax, or effusion. Pulmonary vascularity is normally distributed. The cardiomediastinal contours are unremarkable. No acute osseous or soft tissue abnormality. IMPRESSION: No acute cardiopulmonary abnormality. Electronically Signed   By: Lovena Le M.D.   On: 06/17/2019 18:49   CT ABDOMEN PELVIS W CONTRAST  Result Date: 06/17/2019 CLINICAL DATA:  44 year old female with abdominal pain. Concern for acute pancreatitis. EXAM: CT ABDOMEN AND PELVIS WITH CONTRAST TECHNIQUE: Multidetector CT imaging of the abdomen and pelvis was performed using the standard protocol following bolus administration of intravenous contrast. CONTRAST:  165mL OMNIPAQUE IOHEXOL 300 MG/ML  SOLN COMPARISON:  CT abdomen pelvis dated 12/25/2010. FINDINGS: Lower chest: The visualized lung bases are clear. No intra-abdominal free air or free fluid. Hepatobiliary: Advanced fatty infiltration of the liver. No intrahepatic biliary ductal dilatation. The gallbladder is unremarkable. Pancreas: Unremarkable. No pancreatic ductal dilatation or surrounding inflammatory changes. Spleen: Normal in size without focal abnormality. Adrenals/Urinary Tract: The adrenal glands are unremarkable. The kidneys, visualized ureters, and urinary bladder appear unremarkable as well. Stomach/Bowel: There is no bowel obstruction or active inflammation. The appendix is normal. Vascular/Lymphatic: The abdominal aorta and IVC are unremarkable. No portal venous gas. There is a 12 mm rounded lymph node to the left of the abdominal aorta (series 3, image 42) of indeterminate etiology or clinical significance. Clinical correlation and attention on follow-up recommended. Reproductive: Hysterectomy. Dominant 15 mm right adnexal follicle/cyst. Other: None Musculoskeletal: No acute or significant osseous findings. IMPRESSION: 1. No acute intra-abdominal  or pelvic pathology. No bowel obstruction. Normal appendix. 2. Fatty liver. 3. A 12 mm rounded lymph node to the left of the abdominal aorta of indeterminate etiology or clinical significance. Clinical correlation and attention on follow-up recommended. Electronically Signed   By: Anner Crete M.D.   On: 06/17/2019 19:28    Scheduled Meds: . enoxaparin (LOVENOX) injection  40 mg Subcutaneous Q24H  . famotidine  20 mg Oral Daily  . folic acid  1 mg Oral Daily  . multivitamin with minerals  1 tablet Oral Daily  . potassium chloride  40 mEq Oral Q4H  . thiamine  100 mg Oral Daily   Or  . thiamine  100 mg Intravenous Daily   Continuous Infusions:   LOS: 0 days   Time spent: 31 minutes   Darliss Cheney, MD [facility]  06/18/2019, 12:13 PM   To contact the attending provider between 7A-7P or the covering provider during after hours  7P-7A, please log into the web site [email or web address].

## 2019-06-19 LAB — BASIC METABOLIC PANEL
Anion gap: 11 (ref 5–15)
BUN: 5 mg/dL — ABNORMAL LOW (ref 6–20)
CO2: 24 mmol/L (ref 22–32)
Calcium: 9.1 mg/dL (ref 8.9–10.3)
Chloride: 104 mmol/L (ref 98–111)
Creatinine, Ser: 0.8 mg/dL (ref 0.44–1.00)
GFR calc Af Amer: >60 mL/min (ref >60)
GFR calc non Af Amer: >60 mL/min (ref >60)
Glucose, Bld: 100 mg/dL — ABNORMAL HIGH (ref 70–99)
Potassium: 3.9 mmol/L (ref 3.5–5.1)
Sodium: 139 mmol/L (ref 135–145)

## 2019-06-19 LAB — CBC
HCT: 32.6 % — ABNORMAL LOW (ref 36.0–46.0)
Hemoglobin: 10.7 g/dL — ABNORMAL LOW (ref 12.0–15.0)
MCH: 28.5 pg (ref 26.0–34.0)
MCHC: 32.8 g/dL (ref 30.0–36.0)
MCV: 86.7 fL (ref 80.0–100.0)
Platelets: 139 10*3/uL — ABNORMAL LOW (ref 150–400)
RBC: 3.76 MIL/uL — ABNORMAL LOW (ref 3.87–5.11)
RDW: 13.9 % (ref 11.5–15.5)
WBC: 4.5 10*3/uL (ref 4.0–10.5)
nRBC: 0 % (ref 0.0–0.2)

## 2019-06-19 MED ORDER — NYSTATIN 100000 UNIT/ML MT SUSP
5.0000 mL | Freq: Four times a day (QID) | OROMUCOSAL | Status: DC
  Administered 2019-06-19 – 2019-06-20 (×4): 500000 [IU] via ORAL
  Filled 2019-06-19 (×4): qty 5

## 2019-06-19 MED ORDER — METOPROLOL TARTRATE 25 MG PO TABS
25.0000 mg | ORAL_TABLET | Freq: Two times a day (BID) | ORAL | Status: DC
  Administered 2019-06-19 (×2): 25 mg via ORAL
  Filled 2019-06-19 (×3): qty 1

## 2019-06-19 NOTE — Plan of Care (Signed)
  Problem: Education: Goal: Knowledge of General Education information will improve Description: Including pain rating scale, medication(s)/side effects and non-pharmacologic comfort measures Outcome: Completed/Met   Problem: Health Behavior/Discharge Planning: Goal: Ability to manage health-related needs will improve Outcome: Completed/Met   Problem: Clinical Measurements: Goal: Ability to maintain clinical measurements within normal limits will improve Outcome: Completed/Met Goal: Will remain free from infection Outcome: Completed/Met Goal: Diagnostic test results will improve Outcome: Completed/Met Goal: Respiratory complications will improve Outcome: Completed/Met Goal: Cardiovascular complication will be avoided Outcome: Completed/Met   Problem: Activity: Goal: Risk for activity intolerance will decrease Outcome: Completed/Met   Problem: Elimination: Goal: Will not experience complications related to bowel motility Outcome: Completed/Met Goal: Will not experience complications related to urinary retention Outcome: Completed/Met   Problem: Pain Managment: Goal: General experience of comfort will improve Outcome: Completed/Met   Problem: Safety: Goal: Ability to remain free from injury will improve Outcome: Completed/Met   Problem: Skin Integrity: Goal: Risk for impaired skin integrity will decrease Outcome: Completed/Met

## 2019-06-19 NOTE — Progress Notes (Signed)
PROGRESS NOTE    Danielle Reilly  BZJ:[id number] DOB: May 11, 1975 DOA: 06/17/2019 PCP: System, Pcp Not In   Brief Narrative:  HPI: Danielle Reilly is a 44 y.o. female with PMH of GERD who presented to ER with abdominal pain along with nausea and vomiting and admitted under observation for alcohol withdrawal.  Patient reports that she started drinking in her early 49's. Her father and sister also struggle with alcoholism. She reports drinking at times "heavily" over the past several years, but that she began drinking more starting in 15-Apr-2023 after her mother died. Yesterday she drank a fifth of liquor before going to bed last night; last drink possibly around 2300. She reports "this is it" and that she "is done." She does not drink daily, but does drink most days. Sometimes she will stop and go 7-8 days without drinking. Denies history of DT's or withdrawal seizures. Her son brought her to the ED for nausea, vomiting and abdominal pain. Reports waking up around 0900 this morning and that she has had vague abdominal pain with nausea and vomiting. She has been unable to keep down any PO all day. Denies blood in vomit. Abdominal pain mainly located in epigastric region and reports some burning in her throat and over left side of chest. Denies SI/HI or history of this. She has not tried anything for her nausea/vomiting/pain today. Denies fever, chills, cough, SOB, diarrhea, constipation, dysuria, hematuria, hematochezia, melena, difficulty moving arms/legs, speech difficulty, trouble eating, confusion or any other complaints.  In the ED: Vitals stable with tachycardia to 130's and mild-range BP's. CMP with K 3.1 and AST 60. Anion gap 20 and CO2 17. Ethanol level 152. CBC WNL. UDS positive for benzos only. Lipase and Magnesium WNL. Patient was given Maalox, Ativan, Zofran, 40 mEq Kdur and thiamine. Received 2 L IVF. She failed her PO challenge. Called for admission as ED provider felt patient needed further observation  due to tachycardia and unable to tolerate PO.  Assessment & Plan:   Principal Problem:   Alcohol withdrawal (HCC) Active Problems:   Hypokalemia   Nausea and vomiting   Tachycardia   Increased anion gap metabolic acidosis   GERD (gastroesophageal reflux disease)   Nausea/vomiting/abdominal pain/alcohol withdrawal: Patient symptoms are likely secondary to alcohol withdrawal.  Patient has received couple of doses of Ativan again last 24 hours but has required less than previous 24 hours.  CIWA improving however she still has fine tremors in upper extremities as well as some anxiety.  CT abdomen negative for acute pathology.  She is agreeable to stay at least 1 more night.  We will continue current CIWA protocol with as needed Ativan.  Hypokalemia: Resolved.  Oral candidiasis/throat pain: Start on nystatin swish and swallow 4 times a day.  DVT prophylaxis: Lovenox Code Status: Full code Family Communication:  None present at bedside.  Plan of care discussed with patient in length and he verbalized understanding and agreed with it. Patient is from: Home Disposition Plan: Home potentially tomorrow  barriers to discharge: Active alcohol withdrawal needs inpatient treatment   Estimated body mass index is 29.06 kg/m as calculated from the following:   Height as of this encounter: 5\' 4"  (1.626 m).   Weight as of this encounter: 76.8 kg.      Nutritional status:               Consultants:   None  Procedures:   None  Antimicrobials:   None   Subjective: Seen and examined.  Continues to complain of anxiety and also has new complaint of throat pain.  No other complaint.  Objective: Vitals:   06/18/19 0900 06/18/19 1830 06/18/19 2020 06/19/19 0507  BP: 107/69 121/79 118/76 113/66  Pulse: (!) 109 (!) 103 (!) 104 92  Resp: 18  20 20   Temp: 98 F (36.7 C) 97.9 F (36.6 C) 98.5 F (36.9 C) 98.2 F (36.8 C)  TempSrc: Oral Oral Oral Oral  SpO2: 99%  99% 98%    Weight:      Height:        Intake/Output Summary (Last 24 hours) at 06/19/2019 1226 Last data filed at 06/19/2019 0600 Gross per 24 hour  Intake 60 ml  Output --  Net 60 ml   Filed Weights   06/18/19 0050  Weight: 76.8 kg    Examination:  General exam: Appears calm and comfortable  Respiratory system: Clear to auscultation. Respiratory effort normal. Cardiovascular system: S1 & S2 heard, RRR. No JVD, murmurs, rubs, gallops or clicks. No pedal edema. Gastrointestinal system: Abdomen is nondistended, soft and nontender. No organomegaly or masses felt. Normal bowel sounds heard.  Tongue with white coat cheesy material Central nervous system: Alert and oriented. No focal neurological deficits. Extremities: Symmetric 5 x 5 power. Skin: No rashes, lesions or ulcers.  Psychiatry: Judgement and insight appear normal. Mood & affect appropriate.     Data Reviewed: I have personally reviewed following labs and imaging studies  CBC: Recent Labs  Lab 06/17/19 1509 06/19/19 0255  WBC 6.1 4.5  HGB 12.3 10.7*  HCT 38.3 32.6*  MCV 88.9 86.7  PLT 212 998*   Basic Metabolic Panel: Recent Labs  Lab 06/17/19 1509 06/17/19 1741 06/18/19 0440 06/19/19 0255  NA 136  --  134* 139  K 3.1*  --  3.2* 3.9  CL 99  --  100 104  CO2 17*  --  22 24  GLUCOSE 162*  --  107* 100*  BUN 9  --  6 5*  CREATININE 0.77  --  0.78 0.80  CALCIUM 8.8*  --  8.4* 9.1  MG  --  2.0  --   --   PHOS  --  2.8  --   --    GFR: Estimated Creatinine Clearance: 90.9 mL/min (by C-G formula based on SCr of 0.8 mg/dL). Liver Function Tests: Recent Labs  Lab 06/17/19 1509 06/18/19 0440  AST 60* 49*  ALT 30 27  ALKPHOS 59 51  BILITOT 1.0 1.9*  PROT 7.7 6.8  ALBUMIN 4.4 3.9   Recent Labs  Lab 06/17/19 1741  LIPASE 26   No results for input(s): AMMONIA in the last 168 hours. Coagulation Profile: No results for input(s): INR, PROTIME in the last 168 hours. Cardiac Enzymes: No results for input(s):  CKTOTAL, CKMB, CKMBINDEX, TROPONINI in the last 168 hours. BNP (last 3 results) No results for input(s): PROBNP in the last 8760 hours. HbA1C: No results for input(s): HGBA1C in the last 72 hours. CBG: No results for input(s): GLUCAP in the last 168 hours. Lipid Profile: No results for input(s): CHOL, HDL, LDLCALC, TRIG, CHOLHDL, LDLDIRECT in the last 72 hours. Thyroid Function Tests: No results for input(s): TSH, T4TOTAL, FREET4, T3FREE, THYROIDAB in the last 72 hours. Anemia Panel: No results for input(s): VITAMINB12, FOLATE, FERRITIN, TIBC, IRON, RETICCTPCT in the last 72 hours. Sepsis Labs: No results for input(s): PROCALCITON, LATICACIDVEN in the last 168 hours.  Recent Results (from the past 240 hour(s))  SARS CORONAVIRUS 2 (TAT  6-24 HRS) Nasopharyngeal Nasopharyngeal Swab     Status: None   Collection Time: 06/17/19 10:35 PM   Specimen: Nasopharyngeal Swab  Result Value Ref Range Status   SARS Coronavirus 2 NEGATIVE NEGATIVE Final    Comment: (NOTE) SARS-CoV-2 target nucleic acids are NOT DETECTED. The SARS-CoV-2 RNA is generally detectable in upper and lower respiratory specimens during the acute phase of infection. Negative results do not preclude SARS-CoV-2 infection, do not rule out co-infections with other pathogens, and should not be used as the sole basis for treatment or other patient management decisions. Negative results must be combined with clinical observations, patient history, and epidemiological information. The expected result is Negative. Fact Sheet for Patients: HairSlick.no Fact Sheet for Healthcare Providers: quierodirigir.com This test is not yet approved or cleared by the [place] FDA and  has been authorized for detection and/or diagnosis of SARS-CoV-2 by FDA under an Emergency Use Authorization (EUA). This EUA will remain  in effect (meaning this test can be used) for the duration of  the COVID-19 declaration under Section 56 4(b)(1) of the Act, 21 U.S.C. section 360bbb-3(b)(1), unless the authorization is terminated or revoked sooner. Performed at [facility], [address], [place], [place] [address]       Radiology Studies: DG Chest 2 View  Result Date: 06/17/2019 CLINICAL DATA:  Chest pain for 1 day, history of anemia and GERD EXAM: CHEST - 2 VIEW COMPARISON:  Radiograph 10/25/2018 FINDINGS: No consolidation, features of edema, pneumothorax, or effusion. Pulmonary vascularity is normally distributed. The cardiomediastinal contours are unremarkable. No acute osseous or soft tissue abnormality. IMPRESSION: No acute cardiopulmonary abnormality. Electronically Signed   By: Kreg Shropshire M.D.   On: 06/17/2019 18:49   CT ABDOMEN PELVIS W CONTRAST  Result Date: 06/17/2019 CLINICAL DATA:  44 year old female with abdominal pain. Concern for acute pancreatitis. EXAM: CT ABDOMEN AND PELVIS WITH CONTRAST TECHNIQUE: Multidetector CT imaging of the abdomen and pelvis was performed using the standard protocol following bolus administration of intravenous contrast. CONTRAST:  OMNIPAQUE IOHEXOL 300 MG/ML  SOLN COMPARISON:  CT abdomen pelvis dated 12/25/2010. FINDINGS: Lower chest: The visualized lung bases are clear. No intra-abdominal free air or free fluid. Hepatobiliary: Advanced fatty infiltration of the liver. No intrahepatic biliary ductal dilatation. The gallbladder is unremarkable. Pancreas: Unremarkable. No pancreatic ductal dilatation or surrounding inflammatory changes. Spleen: Normal in size without focal abnormality. Adrenals/Urinary Tract: The adrenal glands are unremarkable. The kidneys, visualized ureters, and urinary bladder appear unremarkable as well. Stomach/Bowel: There is no bowel obstruction or active inflammation. The appendix is normal. Vascular/Lymphatic: The abdominal aorta and IVC are unremarkable. No portal venous gas. There is a 12 mm rounded lymph  node to the left of the abdominal aorta (series 3, image 42) of indeterminate etiology or clinical significance. Clinical correlation and attention on follow-up recommended. Reproductive: Hysterectomy. Dominant 15 mm right adnexal follicle/cyst. Other: None Musculoskeletal: No acute or significant osseous findings. IMPRESSION: 1. No acute intra-abdominal or pelvic pathology. No bowel obstruction. Normal appendix. 2. Fatty liver. 3. A 12 mm rounded lymph node to the left of the abdominal aorta of indeterminate etiology or clinical significance. Clinical correlation and attention on follow-up recommended. Electronically Signed   By: Elgie Collard M.D.   On: 06/17/2019 19:28    Scheduled Meds:  enoxaparin (LOVENOX) injection  40 mg Subcutaneous Q24H   famotidine  20 mg Oral Daily   folic acid  1 mg Oral Daily   multivitamin with minerals  1 tablet  Oral Daily   nystatin  5 mL Oral QID   thiamine  100 mg Oral Daily   Or   thiamine  100 mg Intravenous Daily   Continuous Infusions:   LOS: 1 day   Time spent: 28 minutes  Hughie Closs, MD [facility]  06/19/2019, 12:26 PM   To contact the attending provider between 7A-7P or the covering provider during after hours 7P-7A, please log into the web site [email or web address].

## 2019-06-20 LAB — BASIC METABOLIC PANEL
Anion gap: 12 (ref 5–15)
BUN: <5 mg/dL — ABNORMAL LOW (ref 6–20)
CO2: 24 mmol/L (ref 22–32)
Calcium: 9.9 mg/dL (ref 8.9–10.3)
Chloride: 103 mmol/L (ref 98–111)
Creatinine, Ser: 0.89 mg/dL (ref 0.44–1.00)
GFR calc Af Amer: >60 mL/min (ref >60)
GFR calc non Af Amer: >60 mL/min (ref >60)
Glucose, Bld: 101 mg/dL — ABNORMAL HIGH (ref 70–99)
Potassium: 3.5 mmol/L (ref 3.5–5.1)
Sodium: 139 mmol/L (ref 135–145)

## 2019-06-20 MED ORDER — LORAZEPAM 0.5 MG PO TABS: 0.5000 mg | ORAL_TABLET | Freq: Three times a day (TID) | ORAL | 0 refills | Status: AC | PRN

## 2019-06-20 MED ORDER — GABAPENTIN 300 MG PO CAPS: 300.0000 mg | ORAL_CAPSULE | Freq: Two times a day (BID) | ORAL | 0 refills | Status: DC

## 2019-06-20 MED ORDER — NYSTATIN 100000 UNIT/ML MT SUSP: 5.0000 mL | Freq: Four times a day (QID) | OROMUCOSAL | 0 refills | Status: AC

## 2019-06-20 MED FILL — GABAPENTIN 300 MG CAPSULE: 300 | 30 days supply | Qty: 60 | Fill #0

## 2019-06-20 MED FILL — LORazepam 0.5 MG TABS: 0.5 | 3 days supply | Qty: 10 | Fill #0

## 2019-06-20 NOTE — Plan of Care (Signed)
°  Problem: Coping: °Goal: Level of anxiety will decrease °Outcome: Progressing °  °

## 2019-06-20 NOTE — Discharge Instructions (Signed)
Alcohol Withdrawal Syndrome When a person who drinks a lot of alcohol stops drinking, he or she may have unpleasant and serious symptoms. These symptoms are called alcohol withdrawal syndrome. This condition may be mild or severe. It can be life-threatening. It can cause:  Shaking that you cannot control (tremor).  Sweating.  Headache.  Feeling fearful, upset, grouchy, or depressed.  Trouble sleeping (insomnia).  Nightmares.  Fast or uneven heartbeats (palpitations).  Alcohol cravings.  Feeling sick to your stomach (nausea).  Throwing up (vomiting).  Being bothered by light and sounds.  Confusion.  Trouble thinking clearly.  Not being hungry (loss of appetite).  Big changes in mood (mood swings). If you have all of the following symptoms at the same time, get help right away:  High blood pressure.  Fast heartbeat.  Trouble breathing.  Seizures.  Seeing, hearing, feeling, smelling, or tasting things that are not there (hallucinations). These symptoms are known as delirium tremens (DTs). They must be treated at the hospital right away. Follow these instructions at home:   Take over-the-counter and prescription medicines only as told by your doctor. This includes vitamins.  Do not drink alcohol.  Do not drive until your doctor says that this is safe for you.  Have someone stay with you or be available in case you need help. This should be someone you trust. This person can help you with your symptoms. He or she can also help you to not drink.  Drink enough fluid to keep your pee (urine) pale yellow.  Think about joining a support group or a treatment program to help you stop drinking.  Keep all follow-up visits as told by your doctor. This is important. Contact a doctor if:  Your symptoms get worse.  You cannot eat or drink without throwing up.  You have a hard time not drinking alcohol.  You cannot stop drinking alcohol. Get help right away  if:  You have fast or uneven heartbeats.  You have chest pain.  You have trouble breathing.  You have a seizure for the first time.  You see, hear, feel, smell, or taste something that is not there.  You get very confused. Summary  When a person who drinks a lot of alcohol stops drinking, he or she may have serious symptoms. This is called alcohol withdrawal syndrome.  Delirium tremens (DTs) is a group of life-threatening symptoms. You should get help right away if you have these symptoms.  Think about joining an alcohol support group or a treatment program. This information is not intended to replace advice given to you by your health care provider. Make sure you discuss any questions you have with your health care provider. Document Revised: 03/16/2017 Document Reviewed: 12/08/2016 Elsevier Patient Education  2020 Elsevier Inc.  

## 2019-06-20 NOTE — Discharge Summary (Signed)
Physician Discharge Summary  [place] GBT:[id number] DOB: 1975-07-04 DOA: 06/17/2019  PCP: System, Pcp Not In  Admit date: 06/17/2019 Discharge date: 06/20/2019  Admitted From: Home Disposition: Home  Recommendations for Outpatient Follow-up:  1. Follow up with PCP in 1-2 weeks 2. Please obtain BMP/CBC in one week 3. Please follow up on the following pending results:  Home Health: None Equipment/Devices: None  Discharge Condition: Stable CODE STATUS: Full code Diet recommendation: Regular  Subjective: Patient examined.  Feels well.  But is still with anxiety related to alcohol.  Brief/Interim Summary: Danielle Reilly a 44 y.o.femalewith PMH of GERD who presented to ER with abdominal pain along with nausea and vomiting and admitted under observation for alcohol withdrawal.  She was treated with as needed IV Ativan per CIWA protocol.  Her last Ativan dose was more than 12 hours ago.  She is feeling much better today except anxiety.  She does not have any further upper extremity fine tremors.  Her vitals have remained stable.  She required couple of doses of Lopressor yesterday due to tachycardia which was likely secondary to alcohol withdrawal.  She was requesting to prescribe her gabapentin and something to control her anxiety for next few days and for that, I have prescribed her gabapentin 300 mg twice daily and Ativan 0.5 mg 3 times daily for only 10 tablets.  She was also diagnosed with oral candidiasis during this hospitalization for which she was started on nystatin and this has been prescribed to her.  She has not had any nausea or vomiting for more than 24 hours and has tolerated diet.  She is being discharged in stable condition.  She was extensively counseled regarding cutting down or in fact quitting alcohol.  She seemed determined.  Discharge Diagnoses:  Principal Problem:   Alcohol withdrawal (HCC) Active Problems:   Hypokalemia   Nausea and vomiting   Tachycardia   Increased  anion gap metabolic acidosis   GERD (gastroesophageal reflux disease)    Discharge Instructions  Discharge Instructions    Discharge patient   Complete by: As directed    Discharge disposition: 01-Home or Self Care   Discharge patient date: 06/20/2019     Allergies as of 06/20/2019   No Known Allergies     Medication List    TAKE these medications   B COMPLEX 1 PO Take 1 tablet by mouth daily with breakfast.   BC HEADACHE POWDER PO Take 1 packet by mouth as needed (for headaches).   famotidine 20 MG tablet Commonly known as: PEPCID Take 1 tablet (20 mg total) by mouth daily. What changed:   when to take this  reasons to take this   ferrous sulfate 325 (65 FE) MG tablet Take 325 mg by mouth daily with breakfast.   gabapentin 300 MG capsule Commonly known as: Neurontin Take 1 capsule (300 mg total) by mouth 2 (two) times daily. What changed:   when to take this  reasons to take this   LORazepam 0.5 MG tablet Commonly known as: Ativan Take 1 tablet (0.5 mg total) by mouth every 8 (eight) hours as needed for up to 3 days for anxiety.   mometasone 50 MCG/ACT nasal spray Commonly known as: NASONEX Place 2 sprays into the nose at bedtime as needed (for congestion).   multivitamin with minerals Tabs tablet Take 1 tablet by mouth daily.   nystatin 100000 UNIT/ML suspension Commonly known as: MYCOSTATIN Take 5 mLs (500,000 Units total) by mouth 4 (four) times daily for  7 days.   omeprazole 20 MG capsule Commonly known as: PRILOSEC Take 1 capsule (20 mg total) by mouth daily.   ondansetron 4 MG disintegrating tablet Commonly known as: Zofran ODT Take 1 tablet (4 mg total) by mouth every 8 (eight) hours as needed for nausea. What changed: reasons to take this      Follow-up Information     [facility] Follow up.   Why: July 02, 2019 at 2:30 pm  Contact information: [address] [place]  [id number] [phone]         No Known Allergies  Consultations: None   Procedures/Studies: DG Chest 2 View  Result Date: 06/17/2019 CLINICAL DATA:  Chest pain for 1 day, history of anemia and GERD EXAM: CHEST - 2 VIEW COMPARISON:  Radiograph 10/25/2018 FINDINGS: No consolidation, features of edema, pneumothorax, or effusion. Pulmonary vascularity is normally distributed. The cardiomediastinal contours are unremarkable. No acute osseous or soft tissue abnormality. IMPRESSION: No acute cardiopulmonary abnormality. Electronically Signed   By: Kreg Shropshire M.D.   On: 06/17/2019 18:49   CT ABDOMEN PELVIS W CONTRAST  Result Date: 06/17/2019 CLINICAL DATA:  44 year old female with abdominal pain. Concern for acute pancreatitis. EXAM: CT ABDOMEN AND PELVIS WITH CONTRAST TECHNIQUE: Multidetector CT imaging of the abdomen and pelvis was performed using the standard protocol following bolus administration of intravenous contrast. CONTRAST:  OMNIPAQUE IOHEXOL 300 MG/ML  SOLN COMPARISON:  CT abdomen pelvis dated 12/25/2010. FINDINGS: Lower chest: The visualized lung bases are clear. No intra-abdominal free air or free fluid. Hepatobiliary: Advanced fatty infiltration of the liver. No intrahepatic biliary ductal dilatation. The gallbladder is unremarkable. Pancreas: Unremarkable. No pancreatic ductal dilatation or surrounding inflammatory changes. Spleen: Normal in size without focal abnormality. Adrenals/Urinary Tract: The adrenal glands are unremarkable. The kidneys, visualized ureters, and urinary bladder appear unremarkable as well. Stomach/Bowel: There is no bowel obstruction or active inflammation. The appendix is normal. Vascular/Lymphatic: The abdominal aorta and IVC are unremarkable. No portal venous gas. There is a 12 mm rounded lymph node to the left of the abdominal aorta (series 3, image 42) of indeterminate etiology or clinical significance. Clinical correlation and attention on follow-up  recommended. Reproductive: Hysterectomy. Dominant 15 mm right adnexal follicle/cyst. Other: None Musculoskeletal: No acute or significant osseous findings. IMPRESSION: 1. No acute intra-abdominal or pelvic pathology. No bowel obstruction. Normal appendix. 2. Fatty liver. 3. A 12 mm rounded lymph node to the left of the abdominal aorta of indeterminate etiology or clinical significance. Clinical correlation and attention on follow-up recommended. Electronically Signed   By: Elgie Collard M.D.   On: 06/17/2019 19:28     Discharge Exam: Vitals:   06/20/19 0500 06/20/19 1005  BP: (!) 92/55 127/73  Pulse: 79 80  Resp: 16   Temp: 98.1 F (36.7 C) 99.1 F (37.3 C)  SpO2: 98% 97%   Vitals:   06/19/19 1627 06/19/19 2041 06/20/19 0500 06/20/19 1005  BP: 115/78 117/80 (!) 92/55 127/73  Pulse: 87 89 79 80  Resp: 20 16 16    Temp: 98.2 F (36.8 C) 98.2 F (36.8 C) 98.1 F (36.7 C) 99.1 F (37.3 C)  TempSrc: Oral Oral Oral Oral  SpO2: 97% 96% 98% 97%  Weight:  78.5 kg    Height:        General: Pt is alert, awake, not in acute distress Cardiovascular: RRR, S1/S2 +, no rubs, no gallops Respiratory: CTA bilaterally, no wheezing, no rhonchi Abdominal: Soft, NT, ND, bowel  sounds + Extremities: no edema, no cyanosis    The results of significant diagnostics from this hospitalization (including imaging, microbiology, ancillary and laboratory) are listed below for reference.     Microbiology: Recent Results (from the past 240 hour(s))  SARS CORONAVIRUS 2 (TAT 6-24 HRS) Nasopharyngeal Nasopharyngeal Swab     Status: None   Collection Time: 06/17/19 10:35 PM   Specimen: Nasopharyngeal Swab  Result Value Ref Range Status   SARS Coronavirus 2 NEGATIVE NEGATIVE Final    Comment: (NOTE) SARS-CoV-2 target nucleic acids are NOT DETECTED. The SARS-CoV-2 RNA is generally detectable in upper and lower respiratory specimens during the acute phase of infection. Negative results do not preclude  SARS-CoV-2 infection, do not rule out co-infections with other pathogens, and should not be used as the sole basis for treatment or other patient management decisions. Negative results must be combined with clinical observations, patient history, and epidemiological information. The expected result is Negative. Fact Sheet for Patients: HairSlick.no Fact Sheet for Healthcare Providers: quierodirigir.com This test is not yet approved or cleared by the [place] FDA and  has been authorized for detection and/or diagnosis of SARS-CoV-2 by FDA under an Emergency Use Authorization (EUA). This EUA will remain  in effect (meaning this test can be used) for the duration of the COVID-19 declaration under Section 56 4(b)(1) of the Act, 21 U.S.C. section 360bbb-3(b)(1), unless the authorization is terminated or revoked sooner. Performed at [facility], [address] [place], [place] [address]      Labs: BNP (last 3 results) No results for input(s): BNP in the last 8760 hours. Basic Metabolic Panel: Recent Labs  Lab 06/17/19 1509 06/17/19 1741 06/18/19 0440 06/19/19 0255 06/20/19 0633  NA 136  --  134* 139 139  K 3.1*  --  3.2* 3.9 3.5  CL 99  --  100 104 103  CO2 17*  --  22 24 24   GLUCOSE 162*  --  107* 100* 101*  BUN 9  --  6 5* <5*  CREATININE 0.77  --  0.78 0.80 0.89  CALCIUM 8.8*  --  8.4* 9.1 9.9  MG  --  2.0  --   --   --   PHOS  --  2.8  --   --   --    Liver Function Tests: Recent Labs  Lab 06/17/19 1509 06/18/19 0440  AST 60* 49*  ALT 30 27  ALKPHOS 59 51  BILITOT 1.0 1.9*  PROT 7.7 6.8  ALBUMIN 4.4 3.9   Recent Labs  Lab 06/17/19 1741  LIPASE 26   No results for input(s): AMMONIA in the last 168 hours. CBC: Recent Labs  Lab 06/17/19 1509 06/19/19 0255  WBC 6.1 4.5  HGB 12.3 10.7*  HCT 38.3 32.6*  MCV 88.9 86.7  PLT 212 139*   Cardiac Enzymes: No results for input(s): CKTOTAL,  CKMB, CKMBINDEX, TROPONINI in the last 168 hours. BNP: Invalid input(s): POCBNP CBG: No results for input(s): GLUCAP in the last 168 hours. D-Dimer No results for input(s): DDIMER in the last 72 hours. Hgb A1c No results for input(s): HGBA1C in the last 72 hours. Lipid Profile No results for input(s): CHOL, HDL, LDLCALC, TRIG, CHOLHDL, LDLDIRECT in the last 72 hours. Thyroid function studies No results for input(s): TSH, T4TOTAL, T3FREE, THYROIDAB in the last 72 hours.  Invalid input(s): FREET3 Anemia work up No results for input(s): VITAMINB12, FOLATE, FERRITIN, TIBC, IRON, RETICCTPCT in the last 72 hours. Urinalysis  Component Value Date/Time   COLORURINE AMBER (A) 05/19/2019 2100   APPEARANCEUR HAZY (A) 05/19/2019 2100   LABSPEC 1.026 05/19/2019 2100   PHURINE 6.0 05/19/2019 2100   GLUCOSEU NEGATIVE 05/19/2019 2100   HGBUR SMALL (A) 05/19/2019 2100   BILIRUBINUR NEGATIVE 05/19/2019 2100   KETONESUR 80 (A) 05/19/2019 2100   PROTEINUR 100 (A) 05/19/2019 2100   UROBILINOGEN 0.2 11/14/2014 1755   NITRITE NEGATIVE 05/19/2019 2100   LEUKOCYTESUR NEGATIVE 05/19/2019 2100   Sepsis Labs Invalid input(s): PROCALCITONIN,  WBC,  LACTICIDVEN Microbiology Recent Results (from the past 240 hour(s))  SARS CORONAVIRUS 2 (TAT 6-24 HRS) Nasopharyngeal Nasopharyngeal Swab     Status: None   Collection Time: 06/17/19 10:35 PM   Specimen: Nasopharyngeal Swab  Result Value Ref Range Status   SARS Coronavirus 2 NEGATIVE NEGATIVE Final    Comment: (NOTE) SARS-CoV-2 target nucleic acids are NOT DETECTED. The SARS-CoV-2 RNA is generally detectable in upper and lower respiratory specimens during the acute phase of infection. Negative results do not preclude SARS-CoV-2 infection, do not rule out co-infections with other pathogens, and should not be used as the sole basis for treatment or other patient management decisions. Negative results must be combined with clinical  observations, patient history, and epidemiological information. The expected result is Negative. Fact Sheet for Patients: SugarRoll.be Fact Sheet for Healthcare Providers: https://www.woods-mathews.com/ This test is not yet approved or cleared by the [place] FDA and  has been authorized for detection and/or diagnosis of SARS-CoV-2 by FDA under an Emergency Use Authorization (EUA). This EUA will remain  in effect (meaning this test can be used) for the duration of the COVID-19 declaration under Section 56 4(b)(1) of the Act, 21 U.S.C. section 360bbb-3(b)(1), unless the authorization is terminated or revoked sooner. Performed at [facility], [place] [address], [place], [place] [address]      Time coordinating discharge: Over 30 minutes  SIGNED:   Darliss Cheney, MD  [facility] 06/20/2019, 11:49 AM  If 7PM-7AM, please contact night-coverage [email or web address]

## 2019-06-20 NOTE — Progress Notes (Signed)
Danielle Reilly to be discharged home per MD order. Discussed prescriptions and follow up appointments with the patient. Prescriptions given to patient; medication list explained in detail. Patient verbalized understanding.  Skin clean, dry and intact without evidence of skin break down, no evidence of skin tears noted. IV catheter discontinued intact. Site without signs and symptoms of complications. Dressing and pressure applied. Pt denies pain at the site currently. No complaints noted.  Patient free of lines, drains, and wounds.   An After Visit Summary (AVS) was printed and given to the patient. Patient escorted via wheelchair, and discharged home via private auto.  Gladstone Pih, RN

## 2019-06-20 NOTE — Plan of Care (Signed)
  Problem: Nutrition: Goal: Adequate nutrition will be maintained Outcome: Adequate for Discharge   Problem: Coping: Goal: Level of anxiety will decrease 06/20/2019 1047 by Gladstone Pih, RN Outcome: Adequate for Discharge 06/20/2019 0818 by Gladstone Pih, RN Outcome: Progressing

## 2019-07-01 NOTE — Progress Notes (Signed)
Patient ID: Danielle Reilly, female   DOB: [birth year], 44 y.o.   MRN: [id number] Virtual Visit via Telephone Note  I connected with Danielle Reilly on 07/02/19 at  2:30 PM EDT by telephone and verified that I am speaking with the correct person using two identifiers.   I discussed the limitations, risks, security and privacy concerns of performing an evaluation and management service by telephone and the availability of in person appointments. I also discussed with the patient that there may be a patient responsible charge related to this service. The patient expressed understanding and agreed to proceed.  PATIENT visit by telephone virtually in the context of Covid-19 pandemic. Patient location:  home My Location:  [facility] office Persons on the call:  Me and the patient   History of Present Illness: After hospitalization for alcohol withdrawal 3/2-06/20/2019.  Last drink was prior to hospitalization. Not doing AA or any program of recovery.  No w/d.  No tremor.  No cravings.    Now patient is sick with Covid.  Tested + on 06/29/2019.    Body aches, cough, and chills.  Hasn't checked temp.  No N/V/D.  No respiratory distress.  No loss of taste or smell.     From discharge summary: Recommendations for Outpatient Follow-up:  1. Follow up with PCP in 1-2 weeks 2. Please obtain BMP/CBC in one week 3. Please follow up on the following pending results:  Home Health: None Equipment/Devices: None  Discharge Condition: Stable CODE STATUS: Full code Diet recommendation: Regular  Subjective: Patient examined.  Feels well.  But is still with anxiety related to alcohol.  Brief/Interim Summary: Danielle Reilly a 44 y.o.femalewith PMH of GERD who presented to ER with abdominal pain along with nausea and vomiting and admitted under observation for alcohol withdrawal.  She was treated with as needed IV Ativan per CIWA protocol.  Her last Ativan dose was more than 12 hours ago.  She is feeling much better today  except anxiety.  She does not have any further upper extremity fine tremors.  Her vitals have remained stable.  She required couple of doses of Lopressor yesterday due to tachycardia which was likely secondary to alcohol withdrawal.  She was requesting to prescribe her gabapentin and something to control her anxiety for next few days and for that, I have prescribed her gabapentin 300 mg twice daily and Ativan 0.5 mg 3 times daily for only 10 tablets.  She was also diagnosed with oral candidiasis during this hospitalization for which she was started on nystatin and this has been prescribed to her.  She has not had any nausea or vomiting for more than 24 hours and has tolerated diet.  She is being discharged in stable condition.  She was extensively counseled regarding cutting down or in fact quitting alcohol.  She seemed determined.  Discharge Diagnoses:   Principal Problem:   Alcohol withdrawal (HCC) Active Problems:   Hypokalemia   Nausea and vomiting   Tachycardia   Increased anion gap metabolic acidosis   GERD (gastroesophageal reflux disease)   Observations/Objective:  NAD.  A&Ox3   Assessment and Plan:  1. Alcohol withdrawal syndrome with complication (HCC) I have counseled the patient at length about substance abuse and addiction.  12 step meetings/recovery recommended.  Local 12 step meeting lists were given and attendance was encouraged.  Patient expresses understanding.  Gave the [email or web address] website  2. COVID-19 Fluids, rest, respiratory care.  Quarantine per CDC guidelines - benzonatate (TESSALON) 100 MG capsule; Take 2  capsules (200 mg total) by mouth 3 (three) times daily as needed.  Dispense: 40 capsule; Refill: 0  3. Hospital discharge follow-up    Follow Up Instructions: Assign PCP in 1 month.     I discussed the assessment and treatment plan with the patient. The patient was provided an opportunity to ask questions and all were answered. The patient agreed with the plan  and demonstrated an understanding of the instructions.   The patient was advised to call back or seek an in-person evaluation if the symptoms worsen or if the condition fails to improve as anticipated.  I provided 14 minutes of non-face-to-face time during this encounter.   Freeman Caldron, PA-C

## 2019-07-02 ENCOUNTER — Encounter: Payer: Self-pay | Admitting: Physician Assistant

## 2019-07-02 ENCOUNTER — Other Ambulatory Visit: Payer: Self-pay

## 2019-07-02 ENCOUNTER — Ambulatory Visit: Payer: Self-pay | Attending: Family Medicine | Admitting: Physician Assistant

## 2019-07-02 DIAGNOSIS — F10939 Alcohol use, unspecified with withdrawal, unspecified: Secondary | ICD-10-CM

## 2019-07-02 DIAGNOSIS — U071 COVID-19: Secondary | ICD-10-CM

## 2019-07-02 DIAGNOSIS — Z09 Encounter for follow-up examination after completed treatment for conditions other than malignant neoplasm: Secondary | ICD-10-CM

## 2019-07-02 DIAGNOSIS — F10239 Alcohol dependence with withdrawal, unspecified: Secondary | ICD-10-CM

## 2019-07-02 MED ORDER — BENZONATATE 100 MG PO CAPS: 200.0000 mg | ORAL_CAPSULE | Freq: Three times a day (TID) | ORAL | 0 refills | Status: DC | PRN

## 2019-07-02 NOTE — Progress Notes (Signed)
hfu to help with alcohol withdraw  Last drink was 1.5 wks ago   Found out yesterday she have COVID

## 2019-10-27 ENCOUNTER — Other Ambulatory Visit: Payer: Self-pay

## 2019-10-27 ENCOUNTER — Encounter (HOSPITAL_COMMUNITY): Payer: Self-pay

## 2019-10-27 ENCOUNTER — Emergency Department (HOSPITAL_COMMUNITY)
Admission: EM | Admit: 2019-10-27 | Discharge: 2019-10-28 | Disposition: A | Discharge status: Home / Self Care | Payer: 59 | Attending: Emergency Medicine | Admitting: Emergency Medicine

## 2019-10-27 DIAGNOSIS — R1110 Vomiting, unspecified: Secondary | ICD-10-CM | POA: Insufficient documentation

## 2019-10-27 DIAGNOSIS — Z8616 Personal history of COVID-19: Secondary | ICD-10-CM | POA: Insufficient documentation

## 2019-10-27 DIAGNOSIS — R000 Tachycardia, unspecified: Secondary | ICD-10-CM | POA: Diagnosis not present

## 2019-10-27 DIAGNOSIS — M5416 Radiculopathy, lumbar region: Secondary | ICD-10-CM | POA: Diagnosis not present

## 2019-10-27 DIAGNOSIS — R1084 Generalized abdominal pain: Secondary | ICD-10-CM

## 2019-10-27 DIAGNOSIS — F1010 Alcohol abuse, uncomplicated: Secondary | ICD-10-CM | POA: Diagnosis not present

## 2019-10-27 DIAGNOSIS — R5383 Other fatigue: Secondary | ICD-10-CM

## 2019-10-27 DIAGNOSIS — R0602 Shortness of breath: Secondary | ICD-10-CM | POA: Insufficient documentation

## 2019-10-27 LAB — URINALYSIS, ROUTINE W REFLEX MICROSCOPIC
Bilirubin Urine: NEGATIVE
Glucose, UA: NEGATIVE mg/dL
Ketones, ur: NEGATIVE mg/dL
Leukocytes,Ua: NEGATIVE
Nitrite: NEGATIVE
Protein, ur: NEGATIVE mg/dL
Specific Gravity, Urine: <1.005 — ABNORMAL LOW (ref 1.005–1.030)
pH: 5.5 (ref 5.0–8.0)

## 2019-10-27 LAB — BASIC METABOLIC PANEL
Anion gap: 15 (ref 5–15)
BUN: <5 mg/dL — ABNORMAL LOW (ref 6–20)
CO2: 24 mmol/L (ref 22–32)
Calcium: 9.8 mg/dL (ref 8.9–10.3)
Chloride: 97 mmol/L — ABNORMAL LOW (ref 98–111)
Creatinine, Ser: 1.01 mg/dL — ABNORMAL HIGH (ref 0.44–1.00)
GFR calc Af Amer: >60 mL/min (ref >60)
GFR calc non Af Amer: >60 mL/min (ref >60)
Glucose, Bld: 113 mg/dL — ABNORMAL HIGH (ref 70–99)
Potassium: 3.7 mmol/L (ref 3.5–5.1)
Sodium: 136 mmol/L (ref 135–145)

## 2019-10-27 LAB — CBC
HCT: 41 % (ref 36.0–46.0)
Hemoglobin: 13.1 g/dL (ref 12.0–15.0)
MCH: 28.7 pg (ref 26.0–34.0)
MCHC: 32 g/dL (ref 30.0–36.0)
MCV: 89.9 fL (ref 80.0–100.0)
Platelets: 303 10*3/uL (ref 150–400)
RBC: 4.56 MIL/uL (ref 3.87–5.11)
RDW: 13.9 % (ref 11.5–15.5)
WBC: 9.4 10*3/uL (ref 4.0–10.5)
nRBC: 0 % (ref 0.0–0.2)

## 2019-10-27 LAB — URINALYSIS, MICROSCOPIC (REFLEX)

## 2019-10-27 MED ORDER — SODIUM CHLORIDE 0.9% FLUSH: 3.0000 mL | Freq: Once | INTRAVENOUS | Status: DC

## 2019-10-27 NOTE — ED Triage Notes (Signed)
Pt reports heavy breathing, fatigue after starting back to a workout regimen. States she suffers from anemia, positive for covid in march and since then these symptoms have gotten worse. Pt states she drank over the weekend and is unsure if she could be going through withdrawals. reports drinking one glass of wine every night but states "I drank really bad over the weekend." pt also sees a grief counselor since her mother passed away in Apr 16, 2023

## 2019-10-28 ENCOUNTER — Emergency Department (HOSPITAL_COMMUNITY): Payer: 59

## 2019-10-28 LAB — HEPATIC FUNCTION PANEL
ALT: 24 U/L (ref 0–44)
AST: 35 U/L (ref 15–41)
Albumin: 4.3 g/dL (ref 3.5–5.0)
Alkaline Phosphatase: 59 U/L (ref 38–126)
Bilirubin, Direct: 0.1 mg/dL (ref 0.0–0.2)
Indirect Bilirubin: 1.2 mg/dL — ABNORMAL HIGH (ref 0.3–0.9)
Total Bilirubin: 1.3 mg/dL — ABNORMAL HIGH (ref 0.3–1.2)
Total Protein: 7.8 g/dL (ref 6.5–8.1)

## 2019-10-28 LAB — TSH: TSH: 5.269 u[IU]/mL — ABNORMAL HIGH (ref 0.350–4.500)

## 2019-10-28 MED ORDER — CHLORDIAZEPOXIDE HCL 25 MG PO CAPS
ORAL_CAPSULE | ORAL | 0 refills | Status: DC
Start: 2019-10-28 — End: 2020-01-09

## 2019-10-28 MED ORDER — SODIUM CHLORIDE 0.9 % IV BOLUS
1000.0000 mL | Freq: Once | INTRAVENOUS | Status: AC
  Administered 2019-10-28: 1000 mL via INTRAVENOUS

## 2019-10-28 NOTE — ED Provider Notes (Signed)
Physical Exam  BP (!) 114/54   Pulse (!) 109   Temp 98.6 F (37 C) (Oral)   Resp 17   Ht 5\' 5"  (1.651 m)   Wt 77.1 kg   LMP 06/19/2013   SpO2 97%   BMI 28.29 kg/m   Physical Exam Vitals and nursing note reviewed.  Constitutional:      General: She is not in acute distress.    Appearance: She is well-developed. She is not diaphoretic.  HENT:     Head: Normocephalic and atraumatic.  Eyes:     General: No scleral icterus.    Conjunctiva/sclera: Conjunctivae normal.  Pulmonary:     Effort: Pulmonary effort is normal. No respiratory distress.  Musculoskeletal:     Cervical back: Normal range of motion.  Skin:    Findings: No rash.  Neurological:     Mental Status: She is alert.     ED Course/Procedures   Clinical Course as of Oct 27 1109  Tue Oct 28, 2019  0639 44 yo female with history as above, on exam has mild generalized abdominal tenderness, R>L low back pain without red flag symptoms, and fatigue.  CBC and BMP without significant findings. EKG with sinus tachycardia.  Plan is to check hepatic function due to history of alcohol use with complaint of abdominal pain. Recommend patient take her prilosec as directed. Regarding low back pain, suspect musculoskeletal pain/radiculopathy, recommend NSAID course (with prilosec) and follow up with PCP. Regarding fatigue- will check TSH, advised this is send out and can follow results with PCP.  Will given IV fluids and recheck Hr/vitals.    [LM]  55 Care signed out to oncoming provider.    [LM]    Clinical Course User Index [LM] [id number], PA-C    Procedures  MDM   Care of patient assumed from [facility] [place] at 7 AM.  Agree with history, physical exam and plan.  See their note for further details.  Briefly, 44 y.o. female with PMH/PSH as below who presents with multiple complaints. 1.  Low back pain for a few weeks without injuries or falls.  Radiates to the right pelvis, worse with walking.  The symptoms are most  likely musculoskeletal in nature and be treated as such with NSAIDs. 2.  Abdominal pain while drinking alcohol this past weekend.  Reports nonbloody emesis, nonbloody stools, does report history of constipation secondary to her iron supplement use.  Has Prilosec for gastritis but has not been taking it. 3. Fatigue for the past 2 to 3 weeks.  Chronic shortness of breath since having Covid infection in March.  Denies fevers.  No chest pain.  Past Medical History:  Diagnosis Date  . Anemia   . GERD (gastroesophageal reflux disease)    tums as needed  . Hemoperitoneum 12/25/2010  . SVD (spontaneous vaginal delivery)    x 1  . Toothache 01/29/2013   Past Surgical History:  Procedure Laterality Date  . BILATERAL SALPINGECTOMY N/A 06/19/2013   Procedure: BILATERAL SALPINGECTOMY;  Surgeon: 08/19/2013, MD;  Location: [facility];  Service: Gynecology;  Laterality: N/A;  . DILITATION & CURRETTAGE/HYSTROSCOPY WITH NOVASURE ABLATION N/A 04/04/2013   Procedure: DILATATION & CURETTAGE/HYSTEROSCOPY WITH Attempted NOVASURE ABLATION ;  Surgeon: 04/06/2013, MD;  Location: [facility];  Service: Gynecology;  Laterality: N/A;  . HYSTEROSCOPY N/A 05/26/2013   Procedure: UNSUCCESSFUL HYSTEROSCOPY WITH HYDROTHERMAL ABLATION;  Surgeon: 07/24/2013, MD;  Location: [facility];  Service: Gynecology;  Laterality: N/A;  . LAPAROSCOPY  12/25/2010   Procedure: LAPAROSCOPY OPERATIVE;  Surgeon: Roseanna Rainbow, MD;  Location: [facility];  Service: Gynecology;  Laterality: N/A;  Operative laparoscopy /cauterization of right hemorrhagic cyst wall. Removal of belly rings.  Marland Kitchen LAPAROSCOPY FOR ECTOPIC PREGNANCY    . NOVASURE ABLATION N/A 05/26/2013   Procedure: UNSUCCESSFUL NOVASURE ABLATION;  Surgeon: Willodean Rosenthal, MD;  Location: [facility];  Service: Gynecology;  Laterality: N/A;  . uterine biopsy    . VAGINAL HYSTERECTOMY N/A 06/19/2013   Procedure:  TOTAL VAGINAL HYSTERECTOMY with bilateral salpingectomy;   Surgeon: Willodean Rosenthal, MD;  Location: [facility];  Service: Gynecology;  Laterality: N/A;      Current Plan: We will need to obtain LFTs and TSH.  Patient tachycardic here on arrival, will recheck after IV fluid bolus. Patient will need to be referred to PCP.  MDM/ED Course: 8:28 AM On recheck patient resting comfortably however continues to be tachycardic now to the 120s.  States that this is happened to her in the past and "they kept me here until my heart rate was normal again."  She denies worsening chest pain or SOB. Will give another fluid bolus and reassess.  9:02 AM Patient states that she does not drink alcohol on a daily basis.  States that she will drink 2-3 times a week and this only be a glass of wine.  States that her last drink was on 10/26/2019 where she shared a bottle and believes that she drank "1/5 of liquor."  She does not feel that she is in alcohol withdrawal but she is unsure.  10:02 AM Patient CIWA score is 0.  She remains tachycardic but has improved to around 107. She remains overall well appearing. Chart review shows that she has been tachycardic in previous visits as well. Will complete remainder of fluid bolus and reassess. CXR without any acute findings.  11:14 AM Heart rate improved.  Will discharge home with supportive treatment as outlined by previous team including NSAIDs and Prilosec. She is requesting Librium in hopes that she can taper off her alcohol use.  Consults: None   Significant labs/images: Labs Reviewed  BASIC METABOLIC PANEL - Abnormal; Notable for the following components:      Result Value   Chloride 97 (*)    Glucose, Bld 113 (*)    BUN <5 (*)    Creatinine, Ser 1.01 (*)    All other components within normal limits  URINALYSIS, ROUTINE W REFLEX MICROSCOPIC - Abnormal; Notable for the following components:   APPearance HAZY (*)    Specific Gravity, Urine <1.005 (*)    Hgb urine dipstick SMALL (*)    All other components  within normal limits  URINALYSIS, MICROSCOPIC (REFLEX) - Abnormal; Notable for the following components:   Bacteria, UA MANY (*)    All other components within normal limits  HEPATIC FUNCTION PANEL - Abnormal; Notable for the following components:   Total Bilirubin 1.3 (*)    Indirect Bilirubin 1.2 (*)    All other components within normal limits  TSH - Abnormal; Notable for the following components:   TSH 5.269 (*)    All other components within normal limits  CBC     I personally reviewed and interpreted all labs.   Patient is hemodynamically stable, in NAD, and able to ambulate in the ED. Evaluation does not show pathology that would require ongoing emergent intervention or inpatient treatment. I explained the diagnosis to the patient. Pain has been managed and has no complaints prior  to discharge. Patient is comfortable with above plan and is stable for discharge at this time. All questions were answered prior to disposition. Strict return precautions for returning to the ED were discussed. Encouraged follow up with PCP.   An After Visit Summary was printed and given to the patient.   Portions of this note were generated with Scientist, clinical (histocompatibility and immunogenetics). Dictation errors may occur despite best attempts at proofreading.    Dietrich Pates, PA-C 10/28/19 1201    Jacalyn Lefevre, MD 10/28/19 1210

## 2019-10-28 NOTE — Discharge Instructions (Addendum)
Take medications as prescribed. Follow-up with your primary care provider listed below. Return to the ER for chest pain, shortness of breath, tremors, vision changes, numbness in arms or legs.

## 2019-10-28 NOTE — ED Provider Notes (Signed)
[facility] EMERGENCY DEPARTMENT Provider Note   CSN: [id number] Arrival date & time: 10/27/19  1639     History Chief Complaint  Patient presents with  . Fatigue    Danielle Reilly is a 44 y.o. female.  44 year old female with complaints of fatigue, low back pain, abdominal pain- as detailed below.  Low back pain for a few weeks without fall or injuries, progressively worsening, radiates to right side pelvis/thigh, pain is worse with walking, improves with- nothing, has not taken anything for the pain, described as aching in nature.  Abdominal pain onset while drinking this past weekend, sometimes worse with eating however is really worse if not eating anything, diffuse, known gastritis but not normally this bad, vomiting on Sunday (non bloody), reports constipation (no blood in stools, is on an iron supplement), no changes in bladder habits. Not taking anything for gastritis (has medicine but is not taking).  Fatigue x 2-3 weeks, sleeping more than usual, more tired than normal, does not have a PCP. Reports SHOB since having COVID in March. Has not had COVID vaccine. Denies fevers, feels hot at time (Described as whole body burning up and can't cool down). No unintentional weight loss. No lower extremity swelling. Denies CP.         Past Medical History:  Diagnosis Date  . Anemia   . GERD (gastroesophageal reflux disease)    tums as needed  . Hemoperitoneum 12/25/2010  . SVD (spontaneous vaginal delivery)    x 1  . Toothache 01/29/2013    Patient Active Problem List   Diagnosis Date Noted  . Alcohol withdrawal (HCC) 06/17/2019  . Hypokalemia 06/17/2019  . Nausea and vomiting 06/17/2019  . Tachycardia 06/17/2019  . Increased anion gap metabolic acidosis 06/17/2019  . GERD (gastroesophageal reflux disease) 06/17/2019  . Gingivitis, acute 01/29/2013  . Abnormal uterine bleeding 11/29/2012  . Hemorrhagic ovarian cyst 12/26/2010    Past Surgical History:    Procedure Laterality Date  . BILATERAL SALPINGECTOMY N/A 06/19/2013   Procedure: BILATERAL SALPINGECTOMY;  Surgeon: Willodean Rosenthal, MD;  Location: [facility];  Service: Gynecology;  Laterality: N/A;  . DILITATION & CURRETTAGE/HYSTROSCOPY WITH NOVASURE ABLATION N/A 04/04/2013   Procedure: DILATATION & CURETTAGE/HYSTEROSCOPY WITH Attempted NOVASURE ABLATION ;  Surgeon: Willodean Rosenthal, MD;  Location: [facility];  Service: Gynecology;  Laterality: N/A;  . HYSTEROSCOPY N/A 05/26/2013   Procedure: UNSUCCESSFUL HYSTEROSCOPY WITH HYDROTHERMAL ABLATION;  Surgeon: Willodean Rosenthal, MD;  Location: [facility];  Service: Gynecology;  Laterality: N/A;  . LAPAROSCOPY  12/25/2010   Procedure: LAPAROSCOPY OPERATIVE;  Surgeon: Roseanna Rainbow, MD;  Location: [facility];  Service: Gynecology;  Laterality: N/A;  Operative laparoscopy /cauterization of right hemorrhagic cyst wall. Removal of belly rings.  Marland Kitchen LAPAROSCOPY FOR ECTOPIC PREGNANCY    . NOVASURE ABLATION N/A 05/26/2013   Procedure: UNSUCCESSFUL NOVASURE ABLATION;  Surgeon: Willodean Rosenthal, MD;  Location: [facility];  Service: Gynecology;  Laterality: N/A;  . uterine biopsy    . VAGINAL HYSTERECTOMY N/A 06/19/2013   Procedure:  TOTAL VAGINAL HYSTERECTOMY with bilateral salpingectomy;  Surgeon: Willodean Rosenthal, MD;  Location: [facility];  Service: Gynecology;  Laterality: N/A;     OB History    Gravida  4   Para  1   Term  1   Preterm      AB  3   Living  1     SAB  1   TAB  2   Ectopic  0   Multiple  Live Births  1           Family History  Problem Relation Age of Onset  . Drug abuse Mother   . Arthritis Maternal Aunt   . Cancer Maternal Aunt   . Drug abuse Maternal Aunt   . Arthritis Maternal Uncle   . Alcohol abuse Maternal Uncle   . Cancer Maternal Uncle   . Drug abuse Maternal Uncle   . Cancer Maternal Grandmother   . Alcohol abuse Maternal Grandfather   . Cancer Maternal Grandfather     Social History    Tobacco Use  . Smoking status: Never Smoker  . Smokeless tobacco: Never Used  Vaping Use  . Vaping Use: Never used  Substance Use Topics  . Alcohol use: Yes    Comment: 1 + pint of liquor per day  . Drug use: Yes    Types: Benzodiazepines    Home Medications Prior to Admission medications   Medication Sig Start Date End Date Taking? Authorizing Provider  benzonatate (TESSALON) 100 MG capsule Take 2 capsules (200 mg total) by mouth 3 (three) times daily as needed. Patient not taking: Reported on 10/28/2019 07/02/19   Anders Simmonds, PA-C  famotidine (PEPCID) 20 MG tablet Take 1 tablet (20 mg total) by mouth daily. Patient not taking: Reported on 10/28/2019 05/19/19 10/27/28  Jacalyn Lefevre, MD  gabapentin (NEURONTIN) 300 MG capsule Take 1 capsule (300 mg total) by mouth 2 (two) times daily. Patient not taking: Reported on 10/28/2019 06/20/19 10/27/28  Hughie Closs, MD  omeprazole (PRILOSEC) 20 MG capsule Take 1 capsule (20 mg total) by mouth daily. Patient not taking: Reported on 10/28/2019 05/24/17   Bethel Born, PA-C  ondansetron (ZOFRAN ODT) 4 MG disintegrating tablet Take 1 tablet (4 mg total) by mouth every 8 (eight) hours as needed for nausea. Patient not taking: Reported on 10/28/2019 05/19/19   Jacalyn Lefevre, MD    Allergies    Patient has no known allergies.  Review of Systems   Review of Systems  Constitutional: Positive for fatigue. Negative for chills, fever and unexpected weight change.  Respiratory: Positive for shortness of breath.   Cardiovascular: Negative for chest pain.  Gastrointestinal: Positive for abdominal pain, constipation, nausea and vomiting. Negative for blood in stool and diarrhea.  Genitourinary: Negative for dysuria and frequency.  Musculoskeletal: Positive for back pain.  Allergic/Immunologic: Negative for immunocompromised state.  Neurological: Negative for weakness and numbness.  All other systems reviewed and are negative.   Physical  Exam Updated Vital Signs BP 118/70   Pulse (!) 103   Temp 98.6 F (37 C) (Oral)   Resp (!) 22   Ht 5\' 5"  (1.651 m)   Wt 77.1 kg   LMP 06/19/2013   SpO2 98%   BMI 28.29 kg/m   Physical Exam Vitals and nursing note reviewed.  Constitutional:      General: She is not in acute distress.    Appearance: She is well-developed. She is not diaphoretic.  HENT:     Head: Normocephalic and atraumatic.  Cardiovascular:     Rate and Rhythm: Regular rhythm. Tachycardia present.     Pulses: Normal pulses.     Heart sounds: Normal heart sounds.  Pulmonary:     Effort: Pulmonary effort is normal.     Breath sounds: Normal breath sounds.  Chest:     Chest wall: No tenderness.  Abdominal:     Palpations: Abdomen is soft.     Tenderness: There is generalized  abdominal tenderness. There is no right CVA tenderness or left CVA tenderness.     Comments: Mild generalized abdominal tenderness   Musculoskeletal:        General: Tenderness present.       Back:     Right lower leg: No edema.     Left lower leg: No edema.     Comments: Right greater than left low back tenderness, straight leg raise positive for left low back pain with left leg extension and right low back pain with right leg extension. Leg strength symmetric.   Skin:    General: Skin is warm and dry.     Findings: No erythema or rash.  Neurological:     Mental Status: She is alert and oriented to person, place, and time.  Psychiatric:        Behavior: Behavior normal.     ED Results / Procedures / Treatments   Labs (all labs ordered are listed, but only abnormal results are displayed) Labs Reviewed  BASIC METABOLIC PANEL - Abnormal; Notable for the following components:      Result Value   Chloride 97 (*)    Glucose, Bld 113 (*)    BUN <5 (*)    Creatinine, Ser 1.01 (*)    All other components within normal limits  URINALYSIS, ROUTINE W REFLEX MICROSCOPIC - Abnormal; Notable for the following components:   APPearance  HAZY (*)    Specific Gravity, Urine <1.005 (*)    Hgb urine dipstick SMALL (*)    All other components within normal limits  URINALYSIS, MICROSCOPIC (REFLEX) - Abnormal; Notable for the following components:   Bacteria, UA MANY (*)    All other components within normal limits  CBC  HEPATIC FUNCTION PANEL  TSH    EKG EKG Interpretation  Date/Time:  Monday October 27 2019 16:48:35 EDT Ventricular Rate:  111 PR Interval:  124 QRS Duration: 80 QT Interval:  368 QTC Calculation: 500 R Axis:   71 Text Interpretation: Sinus tachycardia Nonspecific T wave abnormality Abnormal ECG When compared with ECG of 06/17/2019, No significant change was found Confirmed by Dione Booze ([id number]) on 10/28/2019 1:07:20 AM   Radiology No results found.  Procedures Procedures (including critical care time)  Medications Ordered in ED Medications  sodium chloride flush (NS) 0.9 % injection 3 mL (has no administration in time range)  sodium chloride 0.9 % bolus 1,000 mL (1,000 mLs Intravenous New Bag/Given 10/28/19 6945)    ED Course  I have reviewed the triage vital signs and the nursing notes.  Pertinent labs & imaging results that were available during my care of the patient were reviewed by me and considered in my medical decision making (see chart for details).  Clinical Course as of Oct 28 654  Tue Oct 28, 2019  0639 44 yo female with history as above, on exam has mild generalized abdominal tenderness, R>L low back pain without red flag symptoms, and fatigue.  CBC and BMP without significant findings. EKG with sinus tachycardia.  Plan is to check hepatic function due to history of alcohol use with complaint of abdominal pain. Recommend patient take her prilosec as directed. Regarding low back pain, suspect musculoskeletal pain/radiculopathy, recommend NSAID course (with prilosec) and follow up with PCP. Regarding fatigue- will check TSH, advised this is send out and can follow results with PCP.   Will given IV fluids and recheck Hr/vitals.    [LM]  [id number] Care signed out to oncoming provider.    [  LM]    Clinical Course User Index [LM] Alden HippMurphy, Remmy Crass A, PA-C   MDM Rules/Calculators/A&P                          Final Clinical Impression(s) / ED Diagnoses Final diagnoses:  Fatigue, unspecified type  Lumbar radiculopathy  Generalized abdominal pain    Rx / DC Orders ED Discharge Orders    None       Jeannie FendMurphy, Siri Buege A, PA-C 10/28/19 [id number]    Dione BoozeGlick, David, MD 10/28/19 [id number]

## 2020-01-08 ENCOUNTER — Other Ambulatory Visit: Payer: Self-pay

## 2020-01-08 ENCOUNTER — Encounter (HOSPITAL_COMMUNITY): Payer: Self-pay | Admitting: Emergency Medicine

## 2020-01-08 ENCOUNTER — Emergency Department (HOSPITAL_COMMUNITY)
Admission: EM | Admit: 2020-01-08 | Discharge: 2020-01-09 | Disposition: A | Discharge status: Home / Self Care | Payer: 59 | Attending: Emergency Medicine | Admitting: Emergency Medicine

## 2020-01-08 DIAGNOSIS — R112 Nausea with vomiting, unspecified: Secondary | ICD-10-CM | POA: Insufficient documentation

## 2020-01-08 DIAGNOSIS — R079 Chest pain, unspecified: Secondary | ICD-10-CM | POA: Diagnosis present

## 2020-01-08 DIAGNOSIS — F10129 Alcohol abuse with intoxication, unspecified: Secondary | ICD-10-CM | POA: Diagnosis not present

## 2020-01-08 DIAGNOSIS — R0789 Other chest pain: Secondary | ICD-10-CM | POA: Insufficient documentation

## 2020-01-08 DIAGNOSIS — R0602 Shortness of breath: Secondary | ICD-10-CM | POA: Insufficient documentation

## 2020-01-08 DIAGNOSIS — Z20822 Contact with and (suspected) exposure to covid-19: Secondary | ICD-10-CM | POA: Diagnosis not present

## 2020-01-08 DIAGNOSIS — F10920 Alcohol use, unspecified with intoxication, uncomplicated: Secondary | ICD-10-CM

## 2020-01-08 LAB — RAPID URINE DRUG SCREEN, HOSP PERFORMED
Amphetamines: NOT DETECTED
Barbiturates: NOT DETECTED
Benzodiazepines: POSITIVE — AB
Cocaine: NOT DETECTED
Opiates: NOT DETECTED
Tetrahydrocannabinol: NOT DETECTED

## 2020-01-08 LAB — I-STAT BETA HCG BLOOD, ED (MC, WL, AP ONLY): I-stat hCG, quantitative: <5 m[IU]/mL (ref <5)

## 2020-01-08 LAB — CBC
HCT: 38.2 % (ref 36.0–46.0)
Hemoglobin: 12.6 g/dL (ref 12.0–15.0)
MCH: 29.6 pg (ref 26.0–34.0)
MCHC: 33 g/dL (ref 30.0–36.0)
MCV: 89.9 fL (ref 80.0–100.0)
Platelets: 204 10*3/uL (ref 150–400)
RBC: 4.25 MIL/uL (ref 3.87–5.11)
RDW: 14.8 % (ref 11.5–15.5)
WBC: 4 10*3/uL (ref 4.0–10.5)
nRBC: 0 % (ref 0.0–0.2)

## 2020-01-08 LAB — URINALYSIS, MICROSCOPIC (REFLEX)

## 2020-01-08 LAB — COMPREHENSIVE METABOLIC PANEL
ALT: 33 U/L (ref 0–44)
AST: 60 U/L — ABNORMAL HIGH (ref 15–41)
Albumin: 4.2 g/dL (ref 3.5–5.0)
Alkaline Phosphatase: 64 U/L (ref 38–126)
Anion gap: 17 — ABNORMAL HIGH (ref 5–15)
BUN: <5 mg/dL — ABNORMAL LOW (ref 6–20)
CO2: 20 mmol/L — ABNORMAL LOW (ref 22–32)
Calcium: 9.5 mg/dL (ref 8.9–10.3)
Chloride: 104 mmol/L (ref 98–111)
Creatinine, Ser: 0.79 mg/dL (ref 0.44–1.00)
GFR calc Af Amer: >60 mL/min (ref >60)
GFR calc non Af Amer: >60 mL/min (ref >60)
Glucose, Bld: 166 mg/dL — ABNORMAL HIGH (ref 70–99)
Potassium: 3 mmol/L — ABNORMAL LOW (ref 3.5–5.1)
Sodium: 141 mmol/L (ref 135–145)
Total Bilirubin: 0.5 mg/dL (ref 0.3–1.2)
Total Protein: 7.8 g/dL (ref 6.5–8.1)

## 2020-01-08 LAB — URINALYSIS, ROUTINE W REFLEX MICROSCOPIC
Bilirubin Urine: NEGATIVE
Glucose, UA: NEGATIVE mg/dL
Ketones, ur: 15 mg/dL — AB
Leukocytes,Ua: NEGATIVE
Nitrite: NEGATIVE
Protein, ur: 100 mg/dL — AB
Specific Gravity, Urine: >1.03 — ABNORMAL HIGH (ref 1.005–1.030)
pH: 6 (ref 5.0–8.0)

## 2020-01-08 LAB — MAGNESIUM: Magnesium: 1.7 mg/dL (ref 1.7–2.4)

## 2020-01-08 LAB — LIPASE, BLOOD: Lipase: 29 U/L (ref 11–51)

## 2020-01-08 LAB — TROPONIN I (HIGH SENSITIVITY)
Troponin I (High Sensitivity): 6 ng/L (ref <18)
Troponin I (High Sensitivity): 2 ng/L (ref <18)

## 2020-01-08 LAB — ETHANOL: Alcohol, Ethyl (B): 136 mg/dL — ABNORMAL HIGH (ref <10)

## 2020-01-08 MED ORDER — LACTATED RINGERS IV BOLUS
1000.0000 mL | Freq: Once | INTRAVENOUS | Status: AC
  Administered 2020-01-08: 1000 mL via INTRAVENOUS

## 2020-01-08 MED ORDER — ALUM & MAG HYDROXIDE-SIMETH 200-200-20 MG/5ML PO SUSP
30.0000 mL | Freq: Once | ORAL | Status: AC
  Administered 2020-01-09: 30 mL via ORAL
  Filled 2020-01-08: qty 30

## 2020-01-08 MED ORDER — LIDOCAINE VISCOUS HCL 2 % MT SOLN
15.0000 mL | Freq: Once | OROMUCOSAL | Status: AC
  Administered 2020-01-09: 15 mL via ORAL
  Filled 2020-01-08: qty 15

## 2020-01-08 MED ORDER — LORAZEPAM 2 MG/ML IJ SOLN
0.5000 mg | Freq: Once | INTRAMUSCULAR | Status: AC
  Administered 2020-01-08: 0.5 mg via INTRAVENOUS
  Filled 2020-01-08: qty 1

## 2020-01-08 MED ORDER — HYDROMORPHONE HCL 1 MG/ML IJ SOLN
0.5000 mg | Freq: Once | INTRAMUSCULAR | Status: AC
  Administered 2020-01-08: 0.5 mg via INTRAVENOUS
  Filled 2020-01-08: qty 1

## 2020-01-08 MED ORDER — ONDANSETRON HCL 4 MG/2ML IJ SOLN
4.0000 mg | Freq: Once | INTRAMUSCULAR | Status: AC
  Administered 2020-01-08: 4 mg via INTRAVENOUS
  Filled 2020-01-08: qty 2

## 2020-01-08 MED ORDER — ONDANSETRON HCL 4 MG/2ML IJ SOLN: 4.0000 mg | Freq: Once | INTRAMUSCULAR | Status: DC

## 2020-01-08 MED ORDER — PANTOPRAZOLE SODIUM 40 MG PO TBEC
40.0000 mg | DELAYED_RELEASE_TABLET | Freq: Once | ORAL | Status: AC
  Administered 2020-01-09: 40 mg via ORAL
  Filled 2020-01-08: qty 1

## 2020-01-08 MED ORDER — THIAMINE HCL 100 MG/ML IJ SOLN
Freq: Once | INTRAVENOUS | Status: AC
  Filled 2020-01-08: qty 1000

## 2020-01-08 MED ORDER — POTASSIUM CHLORIDE CRYS ER 20 MEQ PO TBCR
40.0000 meq | EXTENDED_RELEASE_TABLET | Freq: Once | ORAL | Status: AC
  Administered 2020-01-08: 40 meq via ORAL
  Filled 2020-01-08: qty 2

## 2020-01-08 NOTE — ED Provider Notes (Signed)
[facility] EMERGENCY DEPARTMENT Provider Note   CSN: [id number] Arrival date & time: 01/08/20  1647     History Chief Complaint  Patient presents with  . Chest Pain  . Shortness of Breath  . Abdominal Pain  . Alcohol Problem    Danielle Reilly is a 44 y.o. female who has a history of EtOH use, GERD who presents with two days of chest pain, shortness of breath, nausea, vomiting, and diarrhea. States she has previously been drinking 1 wine cooler per night but starting drinking heavily again approximately three weeks ago. She has been drinking a fifth of liquor each night, but cut back 4 days ago to resume her 1 wine cooler per night habit. Current symptoms started two days later. She has not been tolerating PO well at home. Patient describes chest pain as left sided, "sometimes sharp, and sometimes pressure." Denies fevers, chills. Emesis in nonbilious, nonbloody.    Emesis Severity:  Moderate Timing:  Constant Quality:  Stomach contents Progression:  Worsening Chronicity:  New Relieved by:  None tried Ineffective treatments:  None tried Associated symptoms: diarrhea   Associated symptoms: no abdominal pain, no arthralgias, no chills, no cough, no fever and no sore throat        Past Medical History:  Diagnosis Date  . Anemia   . GERD (gastroesophageal reflux disease)    tums as needed  . Hemoperitoneum 12/25/2010  . SVD (spontaneous vaginal delivery)    x 1  . Toothache 01/29/2013    Patient Active Problem List   Diagnosis Date Noted  . Alcohol withdrawal (HCC) 06/17/2019  . Hypokalemia 06/17/2019  . Nausea and vomiting 06/17/2019  . Tachycardia 06/17/2019  . Increased anion gap metabolic acidosis 06/17/2019  . GERD (gastroesophageal reflux disease) 06/17/2019  . Gingivitis, acute 01/29/2013  . Abnormal uterine bleeding 11/29/2012  . Hemorrhagic ovarian cyst 12/26/2010    Past Surgical History:  Procedure Laterality Date  . BILATERAL  SALPINGECTOMY N/A 06/19/2013   Procedure: BILATERAL SALPINGECTOMY;  Surgeon: Willodean Rosenthal, MD;  Location: [facility];  Service: Gynecology;  Laterality: N/A;  . DILITATION & CURRETTAGE/HYSTROSCOPY WITH NOVASURE ABLATION N/A 04/04/2013   Procedure: DILATATION & CURETTAGE/HYSTEROSCOPY WITH Attempted NOVASURE ABLATION ;  Surgeon: Willodean Rosenthal, MD;  Location: [facility];  Service: Gynecology;  Laterality: N/A;  . HYSTEROSCOPY N/A 05/26/2013   Procedure: UNSUCCESSFUL HYSTEROSCOPY WITH HYDROTHERMAL ABLATION;  Surgeon: Willodean Rosenthal, MD;  Location: [facility];  Service: Gynecology;  Laterality: N/A;  . LAPAROSCOPY  12/25/2010   Procedure: LAPAROSCOPY OPERATIVE;  Surgeon: Roseanna Rainbow, MD;  Location: [facility];  Service: Gynecology;  Laterality: N/A;  Operative laparoscopy /cauterization of right hemorrhagic cyst wall. Removal of belly rings.  Marland Kitchen LAPAROSCOPY FOR ECTOPIC PREGNANCY    . NOVASURE ABLATION N/A 05/26/2013   Procedure: UNSUCCESSFUL NOVASURE ABLATION;  Surgeon: Willodean Rosenthal, MD;  Location: [facility];  Service: Gynecology;  Laterality: N/A;  . uterine biopsy    . VAGINAL HYSTERECTOMY N/A 06/19/2013   Procedure:  TOTAL VAGINAL HYSTERECTOMY with bilateral salpingectomy;  Surgeon: Willodean Rosenthal, MD;  Location: [facility];  Service: Gynecology;  Laterality: N/A;     OB History    Gravida  4   Para  1   Term  1   Preterm      AB  3   Living  1     SAB  1   TAB  2   Ectopic  0   Multiple      Live Births  1           Family History  Problem Relation Age of Onset  . Drug abuse Mother   . Arthritis Maternal Aunt   . Cancer Maternal Aunt   . Drug abuse Maternal Aunt   . Arthritis Maternal Uncle   . Alcohol abuse Maternal Uncle   . Cancer Maternal Uncle   . Drug abuse Maternal Uncle   . Cancer Maternal Grandmother   . Alcohol abuse Maternal Grandfather   . Cancer Maternal Grandfather     Social History   Tobacco Use  . Smoking status: Never  Smoker  . Smokeless tobacco: Never Used  Vaping Use  . Vaping Use: Never used  Substance Use Topics  . Alcohol use: Yes    Comment: 1 + pint of liquor per day  . Drug use: Yes    Types: Benzodiazepines    Home Medications Prior to Admission medications   Medication Sig Start Date End Date Taking? Authorizing Provider  cetirizine (ZYRTEC) 10 MG tablet Take 10 mg by mouth daily as needed for allergies.   Yes [provider]  Multiple Vitamin (MULTIVITAMINS PO) Take 1 tablet by mouth daily.   Yes [provider]  benzonatate (TESSALON) 100 MG capsule Take 2 capsules (200 mg total) by mouth 3 (three) times daily as needed. Patient not taking: Reported on 10/28/2019 07/02/19   Anders Simmonds, PA-C  chlordiazePOXIDE (LIBRIUM) 25 MG capsule 50mg  PO TID x 1D, then 25-50mg  PO BID X 1D, then 25-50mg  PO QD X 1D 01/09/20   01/11/20, MD  famotidine (PEPCID) 20 MG tablet Take 1 tablet (20 mg total) by mouth daily. Patient not taking: Reported on 10/28/2019 05/19/19 10/27/28  10/29/28, MD  gabapentin (NEURONTIN) 300 MG capsule Take 1 capsule (300 mg total) by mouth 2 (two) times daily. Patient not taking: Reported on 10/28/2019 06/20/19 10/27/28  10/29/28, MD  omeprazole (PRILOSEC) 20 MG capsule Take 1 capsule (20 mg total) by mouth daily. Patient not taking: Reported on 10/28/2019 05/24/17   07/22/17, PA-C  ondansetron (ZOFRAN ODT) 4 MG disintegrating tablet Take 1 tablet (4 mg total) by mouth every 8 (eight) hours as needed for nausea. Patient not taking: Reported on 10/28/2019 05/19/19   07/17/19, MD    Allergies    Patient has no known allergies.  Review of Systems   Review of Systems  Constitutional: Negative for chills and fever.  HENT: Negative for ear pain and sore throat.   Eyes: Negative for pain and visual disturbance.  Respiratory: Positive for shortness of breath. Negative for cough.   Cardiovascular: Positive for chest pain. Negative for  palpitations.  Gastrointestinal: Positive for diarrhea, nausea and vomiting. Negative for abdominal pain.  Genitourinary: Negative for dysuria and hematuria.  Musculoskeletal: Negative for arthralgias and back pain.  Skin: Negative for color change and rash.  Neurological: Negative for seizures and syncope.  All other systems reviewed and are negative.   Physical Exam Updated Vital Signs BP (!) 141/88   Pulse 92   Temp 98.1 F (36.7 C) (Oral)   Resp 20   Ht 5\' 6"  (1.676 m)   Wt 77.1 kg   LMP 06/19/2013   SpO2 91%   BMI 27.44 kg/m   Physical Exam Vitals and nursing note reviewed.  Constitutional:      General: She is not in acute distress.    Appearance: She is well-developed.  HENT:     Head: Normocephalic and atraumatic.  Eyes:     Conjunctiva/sclera: Conjunctivae normal.  Cardiovascular:     Rate and Rhythm: Normal rate and regular rhythm.     Heart sounds: Normal heart sounds. No murmur heard.   Pulmonary:     Effort: Pulmonary effort is normal. No respiratory distress.     Breath sounds: Normal breath sounds.  Chest:     Chest wall: Tenderness present.  Abdominal:     Palpations: Abdomen is soft.     Tenderness: There is no abdominal tenderness.  Musculoskeletal:     Cervical back: Neck supple.  Skin:    General: Skin is warm and dry.  Neurological:     General: No focal deficit present.     Mental Status: She is alert and oriented to person, place, and time.     ED Results / Procedures / Treatments   Labs (all labs ordered are listed, but only abnormal results are displayed) Labs Reviewed  COMPREHENSIVE METABOLIC PANEL - Abnormal; Notable for the following components:      Result Value   Potassium 3.0 (*)    CO2 20 (*)    Glucose, Bld 166 (*)    BUN <5 (*)    AST 60 (*)    Anion gap 17 (*)    All other components within normal limits  URINALYSIS, ROUTINE W REFLEX MICROSCOPIC - Abnormal; Notable for the following components:   Specific Gravity,  Urine >1.030 (*)    Hgb urine dipstick MODERATE (*)    Ketones, ur 15 (*)    Protein, ur 100 (*)    All other components within normal limits  ETHANOL - Abnormal; Notable for the following components:   Alcohol, Ethyl (B) 136 (*)    All other components within normal limits  RAPID URINE DRUG SCREEN, HOSP PERFORMED - Abnormal; Notable for the following components:   Benzodiazepines POSITIVE (*)    All other components within normal limits  URINALYSIS, MICROSCOPIC (REFLEX) - Abnormal; Notable for the following components:   Bacteria, UA RARE (*)    All other components within normal limits  RESPIRATORY PANEL BY RT PCR (FLU A&B, COVID)  LIPASE, BLOOD  CBC  MAGNESIUM  I-STAT BETA HCG BLOOD, ED ([facility], [facility], [facility] ONLY)  TROPONIN I (HIGH SENSITIVITY)  TROPONIN I (HIGH SENSITIVITY)    EKG EKG Interpretation  Date/Time:  Thursday January 08 2020 20:08:39 EDT Ventricular Rate:  110 PR Interval:    QRS Duration: 88 QT Interval:  411 QTC Calculation: 556 R Axis:   76 Text Interpretation: Sinus tachycardia Nonspecific T wave abnormality Prolonged QT interval No significant change since last tracing Confirmed by Cathren LaineSteinl, Kevin ([id number]) on 01/08/2020 8:48:17 PM   Radiology No results found.  Procedures Procedures (including critical care time)  Medications Ordered in ED Medications  alum & mag hydroxide-simeth (MAALOX/MYLANTA) 200-200-20 MG/5ML suspension 30 mL (has no administration in time range)    And  lidocaine (XYLOCAINE) 2 % viscous mouth solution 15 mL (has no administration in time range)  pantoprazole (PROTONIX) EC tablet 40 mg (has no administration in time range)  lactated ringers bolus 1,000 mL (0 mLs Intravenous Stopped 01/08/20 2318)  ondansetron (ZOFRAN) injection 4 mg (4 mg Intravenous Given 01/08/20 2122)  potassium chloride SA (KLOR-CON) CR tablet 40 mEq (40 mEq Oral Given 01/08/20 2332)  sodium chloride 0.9 % 1,000 mL with thiamine 100 mg, folic acid 1 mg, multivitamins  adult 10 mL infusion ( Intravenous New Bag/Given 01/08/20 2329)  LORazepam (ATIVAN) injection 0.5 mg (0.5  mg Intravenous Given 01/08/20 2326)  HYDROmorphone (DILAUDID) injection 0.5 mg (0.5 mg Intravenous Given 01/08/20 2332)    ED Course  I have reviewed the triage vital signs and the nursing notes.  Pertinent labs & imaging results that were available during my care of the patient were reviewed by me and considered in my medical decision making (see chart for details).    MDM Rules/Calculators/A&P                          UA w/ protein, ketones. Lipase WNL. K 3, CO2 20, AST 60, ALT 33, AG 17. EtOH 136. Labs consistent with alcoholic/starvation ketosis.   Trop 6, repeat 2. EKG sinus tachycardia, no signs of acute ischemia. Do not suspect ACS at this time. Remaining differential for chest pain includes musculoskeletal pain, GERD/gastritis.   CXR without PNA, pneumothorax, or pneumomediastinum. COVID negative. Not consistent with PE.   After fluids and medications, patient had symptomatic improvement. No indication for admission at this time. Plan to discharge home with librium taper and community resources for substance abuse.   This patient was seen with Dr. Denton Lank.   Final Clinical Impression(s) / ED Diagnoses Final diagnoses:  Chest wall pain  Alcoholic intoxication without complication (HCC)    Rx / DC Orders ED Discharge Orders         Ordered    chlordiazePOXIDE (LIBRIUM) 25 MG capsule        01/09/20 0044           Allayne Butcher, MD 01/09/20 0867    Cathren Laine, MD 01/09/20 1601

## 2020-01-08 NOTE — Discharge Instructions (Addendum)
Please pick up your librium taper.   You may return to the ED if you have any concerns.

## 2020-01-08 NOTE — ED Triage Notes (Signed)
Pt reports chest pain, SOB, headache, fingers tingling, abd pain, nausea, and diarrhea x 2 days.  Reports heavy ETOH use- last ETOH yesterday.

## 2020-01-08 NOTE — ED Notes (Signed)
Rate dose of banana bag verified with dr. Denton Lank

## 2020-01-09 LAB — RESPIRATORY PANEL BY RT PCR (FLU A&B, COVID)
Influenza A by PCR: NEGATIVE
Influenza B by PCR: NEGATIVE
SARS Coronavirus 2 by RT PCR: NEGATIVE

## 2020-01-09 MED ORDER — CHLORDIAZEPOXIDE HCL 25 MG PO CAPS: ORAL_CAPSULE | ORAL | 0 refills | Status: DC

## 2020-01-09 NOTE — ED Notes (Signed)
Pt ambulated in room independently. Drinking gingerale without difficulty. Pt reports her son will come to get her for d/c

## 2020-04-05 ENCOUNTER — Other Ambulatory Visit: Payer: Self-pay

## 2020-04-05 ENCOUNTER — Emergency Department (HOSPITAL_COMMUNITY)
Admission: EM | Admit: 2020-04-05 | Discharge: 2020-04-06 | Disposition: A | Discharge status: Home / Self Care | Payer: 59 | Attending: Emergency Medicine | Admitting: Emergency Medicine

## 2020-04-05 DIAGNOSIS — K2900 Acute gastritis without bleeding: Secondary | ICD-10-CM

## 2020-04-05 DIAGNOSIS — K2901 Acute gastritis with bleeding: Secondary | ICD-10-CM | POA: Diagnosis not present

## 2020-04-05 DIAGNOSIS — E876 Hypokalemia: Secondary | ICD-10-CM | POA: Diagnosis not present

## 2020-04-05 DIAGNOSIS — R251 Tremor, unspecified: Secondary | ICD-10-CM | POA: Insufficient documentation

## 2020-04-05 DIAGNOSIS — R079 Chest pain, unspecified: Secondary | ICD-10-CM | POA: Diagnosis not present

## 2020-04-05 DIAGNOSIS — Z79899 Other long term (current) drug therapy: Secondary | ICD-10-CM | POA: Insufficient documentation

## 2020-04-05 DIAGNOSIS — R1013 Epigastric pain: Secondary | ICD-10-CM | POA: Diagnosis present

## 2020-04-05 DIAGNOSIS — F1029 Alcohol dependence with unspecified alcohol-induced disorder: Secondary | ICD-10-CM | POA: Diagnosis not present

## 2020-04-05 DIAGNOSIS — R112 Nausea with vomiting, unspecified: Secondary | ICD-10-CM | POA: Diagnosis not present

## 2020-04-05 LAB — COMPREHENSIVE METABOLIC PANEL
ALT: 81 U/L — ABNORMAL HIGH (ref 0–44)
AST: 93 U/L — ABNORMAL HIGH (ref 15–41)
Albumin: 4.4 g/dL (ref 3.5–5.0)
Alkaline Phosphatase: 63 U/L (ref 38–126)
Anion gap: 20 — ABNORMAL HIGH (ref 5–15)
BUN: <5 mg/dL — ABNORMAL LOW (ref 6–20)
CO2: 21 mmol/L — ABNORMAL LOW (ref 22–32)
Calcium: 9.6 mg/dL (ref 8.9–10.3)
Chloride: 95 mmol/L — ABNORMAL LOW (ref 98–111)
Creatinine, Ser: 0.88 mg/dL (ref 0.44–1.00)
GFR, Estimated: >60 mL/min (ref >60)
Glucose, Bld: 125 mg/dL — ABNORMAL HIGH (ref 70–99)
Potassium: 2.8 mmol/L — ABNORMAL LOW (ref 3.5–5.1)
Sodium: 136 mmol/L (ref 135–145)
Total Bilirubin: 1.3 mg/dL — ABNORMAL HIGH (ref 0.3–1.2)
Total Protein: 7.6 g/dL (ref 6.5–8.1)

## 2020-04-05 LAB — URINALYSIS, ROUTINE W REFLEX MICROSCOPIC
Bacteria, UA: NONE SEEN
Bilirubin Urine: NEGATIVE
Glucose, UA: 50 mg/dL — AB
Ketones, ur: 20 mg/dL — AB
Leukocytes,Ua: NEGATIVE
Nitrite: NEGATIVE
Protein, ur: >=300 mg/dL — AB
Specific Gravity, Urine: 1.016 (ref 1.005–1.030)
pH: 6 (ref 5.0–8.0)

## 2020-04-05 LAB — CBC
HCT: 37.2 % (ref 36.0–46.0)
Hemoglobin: 12.7 g/dL (ref 12.0–15.0)
MCH: 30.3 pg (ref 26.0–34.0)
MCHC: 34.1 g/dL (ref 30.0–36.0)
MCV: 88.8 fL (ref 80.0–100.0)
Platelets: 222 10*3/uL (ref 150–400)
RBC: 4.19 MIL/uL (ref 3.87–5.11)
RDW: 13.9 % (ref 11.5–15.5)
WBC: 3 10*3/uL — ABNORMAL LOW (ref 4.0–10.5)
nRBC: 0 % (ref 0.0–0.2)

## 2020-04-05 LAB — I-STAT BETA HCG BLOOD, ED (MC, WL, AP ONLY): I-stat hCG, quantitative: <5 m[IU]/mL (ref <5)

## 2020-04-05 LAB — LIPASE, BLOOD: Lipase: 30 U/L (ref 11–51)

## 2020-04-05 NOTE — ED Triage Notes (Signed)
C/O chest pain and abdominal pain, stated hx of gastritis. Stated her intake has decreased recently as well

## 2020-04-06 ENCOUNTER — Emergency Department (HOSPITAL_COMMUNITY): Payer: 59

## 2020-04-06 LAB — BASIC METABOLIC PANEL
Anion gap: 15 (ref 5–15)
BUN: 7 mg/dL (ref 6–20)
CO2: 23 mmol/L (ref 22–32)
Calcium: 8.9 mg/dL (ref 8.9–10.3)
Chloride: 99 mmol/L (ref 98–111)
Creatinine, Ser: 0.91 mg/dL (ref 0.44–1.00)
GFR, Estimated: >60 mL/min (ref >60)
Glucose, Bld: 97 mg/dL (ref 70–99)
Potassium: 3.5 mmol/L (ref 3.5–5.1)
Sodium: 137 mmol/L (ref 135–145)

## 2020-04-06 MED ORDER — METOCLOPRAMIDE HCL 5 MG/ML IJ SOLN
10.0000 mg | INTRAMUSCULAR | Status: AC
  Administered 2020-04-06: 01:00:00 10 mg via INTRAVENOUS
  Filled 2020-04-06: qty 2

## 2020-04-06 MED ORDER — THIAMINE HCL 100 MG PO TABS
100.0000 mg | ORAL_TABLET | Freq: Once | ORAL | Status: AC
  Administered 2020-04-06: 07:00:00 100 mg via ORAL
  Filled 2020-04-06: qty 1

## 2020-04-06 MED ORDER — SUCRALFATE 1 G PO TABS: 1.0000 g | ORAL_TABLET | Freq: Three times a day (TID) | ORAL | 0 refills | Status: DC

## 2020-04-06 MED ORDER — POTASSIUM CHLORIDE 10 MEQ/100ML IV SOLN
10.0000 meq | Freq: Once | INTRAVENOUS | Status: AC
  Administered 2020-04-06: 01:00:00 10 meq via INTRAVENOUS
  Filled 2020-04-06: qty 100

## 2020-04-06 MED ORDER — KETOROLAC TROMETHAMINE 15 MG/ML IJ SOLN
15.0000 mg | Freq: Once | INTRAMUSCULAR | Status: AC
  Administered 2020-04-06: 01:00:00 15 mg via INTRAVENOUS
  Filled 2020-04-06: qty 1

## 2020-04-06 MED ORDER — ALUM & MAG HYDROXIDE-SIMETH 200-200-20 MG/5ML PO SUSP
30.0000 mL | Freq: Once | ORAL | Status: AC
  Administered 2020-04-06: 01:00:00 30 mL via ORAL
  Filled 2020-04-06: qty 30

## 2020-04-06 MED ORDER — LORAZEPAM 1 MG PO TABS
1.0000 mg | ORAL_TABLET | Freq: Once | ORAL | Status: AC
  Administered 2020-04-06: 05:00:00 1 mg via ORAL
  Filled 2020-04-06: qty 1

## 2020-04-06 MED ORDER — SODIUM CHLORIDE 0.9 % IV BOLUS
1000.0000 mL | Freq: Once | INTRAVENOUS | Status: AC
  Administered 2020-04-06: 05:00:00 1000 mL via INTRAVENOUS

## 2020-04-06 MED ORDER — CHLORDIAZEPOXIDE HCL 25 MG PO CAPS: ORAL_CAPSULE | ORAL | 0 refills | Status: DC

## 2020-04-06 MED ORDER — LIDOCAINE VISCOUS HCL 2 % MT SOLN
15.0000 mL | Freq: Once | OROMUCOSAL | Status: AC
  Administered 2020-04-06: 01:00:00 15 mL via ORAL
  Filled 2020-04-06: qty 15

## 2020-04-06 MED ORDER — PANTOPRAZOLE SODIUM 40 MG IV SOLR
40.0000 mg | Freq: Once | INTRAVENOUS | Status: AC
  Administered 2020-04-06: 03:00:00 40 mg via INTRAVENOUS
  Filled 2020-04-06: qty 40

## 2020-04-06 MED ORDER — POTASSIUM CHLORIDE CRYS ER 20 MEQ PO TBCR
40.0000 meq | EXTENDED_RELEASE_TABLET | Freq: Once | ORAL | Status: AC
  Administered 2020-04-06: 01:00:00 40 meq via ORAL
  Filled 2020-04-06: qty 2

## 2020-04-06 MED ORDER — SODIUM CHLORIDE 0.9 % IV BOLUS
1000.0000 mL | Freq: Once | INTRAVENOUS | Status: AC
  Administered 2020-04-06: 01:00:00 1000 mL via INTRAVENOUS

## 2020-04-06 MED ORDER — OMEPRAZOLE 20 MG PO CPDR: 20.0000 mg | DELAYED_RELEASE_CAPSULE | Freq: Every day | ORAL | 0 refills | Status: DC

## 2020-04-06 NOTE — ED Notes (Signed)
Patient verbalized understanding of dc instructions, vss, ambulatory with nad.   

## 2020-04-06 NOTE — ED Provider Notes (Signed)
Patient seen/examined in the Emergency Department in conjunction with Advanced Practice Provider [facility] Patient reports abdominal pain and vomiting. Exam : Awake alert appears uncomfortable.  Patient is tachycardic Plan: Patient will receive IV fluids as well as oral and IV potassium as patient was found to have hypokalemia.  Will investigate causes of pain with abdominal ultrasound.  Patient also need repeat EKG as initial EKG revealed prolonged QT    Zadie Rhine, MD 04/06/20 [phone]

## 2020-04-06 NOTE — ED Provider Notes (Signed)
[facility] EMERGENCY DEPARTMENT Provider Note   CSN: [id number] Arrival date & time: 04/05/20  1314     History Chief Complaint  Patient presents with  . Chest Pain  . Abdominal Pain    Danielle Reilly is a 44 y.o. female.  44 year old female with a history of esophageal reflux, alcohol abuse and dependence presents to the emergency department for evaluation of abdominal pain. She states that she has been experiencing a fairly constant pain in her epigastric abdomen and underneath her bilateral breasts. This occasionally radiates into her chest. She has had nausea and vomiting. Reports decreased oral intake as anything she tries to eat will not stay on her stomach and she will vomit shortly after. Further endorses associated anorexia. She has been compliant with her Prilosec which she is taking for self diagnosed gastritis. Has tried to limit her alcohol intake to 1-2 wine coolers per day. She does still get tremulous when not drinking. She has not had any fevers, hematemesis, melena, hematochezia, urinary symptoms. No history of abdominal surgeries.        Past Medical History:  Diagnosis Date  . Anemia   . GERD (gastroesophageal reflux disease)    tums as needed  . Hemoperitoneum 12/25/2010  . SVD (spontaneous vaginal delivery)    x 1  . Toothache 01/29/2013    Patient Active Problem List   Diagnosis Date Noted  . Alcohol withdrawal (HCC) 06/17/2019  . Hypokalemia 06/17/2019  . Nausea and vomiting 06/17/2019  . Tachycardia 06/17/2019  . Increased anion gap metabolic acidosis 06/17/2019  . GERD (gastroesophageal reflux disease) 06/17/2019  . Gingivitis, acute 01/29/2013  . Abnormal uterine bleeding 11/29/2012  . Hemorrhagic ovarian cyst 12/26/2010    Past Surgical History:  Procedure Laterality Date  . BILATERAL SALPINGECTOMY N/A 06/19/2013   Procedure: BILATERAL SALPINGECTOMY;  Surgeon: Willodean Rosenthal, MD;  Location: [facility];  Service:  Gynecology;  Laterality: N/A;  . DILITATION & CURRETTAGE/HYSTROSCOPY WITH NOVASURE ABLATION N/A 04/04/2013   Procedure: DILATATION & CURETTAGE/HYSTEROSCOPY WITH Attempted NOVASURE ABLATION ;  Surgeon: Willodean Rosenthal, MD;  Location: [facility];  Service: Gynecology;  Laterality: N/A;  . HYSTEROSCOPY N/A 05/26/2013   Procedure: UNSUCCESSFUL HYSTEROSCOPY WITH HYDROTHERMAL ABLATION;  Surgeon: Willodean Rosenthal, MD;  Location: [facility];  Service: Gynecology;  Laterality: N/A;  . LAPAROSCOPY  12/25/2010   Procedure: LAPAROSCOPY OPERATIVE;  Surgeon: Roseanna Rainbow, MD;  Location: [facility];  Service: Gynecology;  Laterality: N/A;  Operative laparoscopy /cauterization of right hemorrhagic cyst wall. Removal of belly rings.  Marland Kitchen LAPAROSCOPY FOR ECTOPIC PREGNANCY    . NOVASURE ABLATION N/A 05/26/2013   Procedure: UNSUCCESSFUL NOVASURE ABLATION;  Surgeon: Willodean Rosenthal, MD;  Location: [facility];  Service: Gynecology;  Laterality: N/A;  . uterine biopsy    . VAGINAL HYSTERECTOMY N/A 06/19/2013   Procedure:  TOTAL VAGINAL HYSTERECTOMY with bilateral salpingectomy;  Surgeon: Willodean Rosenthal, MD;  Location: [facility];  Service: Gynecology;  Laterality: N/A;     OB History    Gravida  4   Para  1   Term  1   Preterm      AB  3   Living  1     SAB  1   IAB  2   Ectopic  0   Multiple      Live Births  1           Family History  Problem Relation Age of Onset  . Drug abuse Mother   . Arthritis Maternal  Aunt   . Cancer Maternal Aunt   . Drug abuse Maternal Aunt   . Arthritis Maternal Uncle   . Alcohol abuse Maternal Uncle   . Cancer Maternal Uncle   . Drug abuse Maternal Uncle   . Cancer Maternal Grandmother   . Alcohol abuse Maternal Grandfather   . Cancer Maternal Grandfather     Social History   Tobacco Use  . Smoking status: Never Smoker  . Smokeless tobacco: Never Used  Vaping Use  . Vaping Use: Never used  Substance Use Topics  . Alcohol use: Yes     Comment: 1 + pint of liquor per day  . Drug use: Yes    Types: Benzodiazepines    Home Medications Prior to Admission medications   Medication Sig Start Date End Date Taking? Authorizing Provider  benzonatate (TESSALON) 100 MG capsule Take 2 capsules (200 mg total) by mouth 3 (three) times daily as needed. Patient not taking: Reported on 10/28/2019 07/02/19   Anders SimmondsMcClung, Angela M, PA-C  cetirizine (ZYRTEC) 10 MG tablet Take 10 mg by mouth daily as needed for allergies.    [provider]  chlordiazePOXIDE (LIBRIUM) 25 MG capsule 50mg  PO TID x 1D, then 25-50mg  PO BID X 1D, then 25-50mg  PO QD X 1D 04/06/20   Antony MaduraHumes, Arlan Birks, PA-C  gabapentin (NEURONTIN) 300 MG capsule Take 1 capsule (300 mg total) by mouth 2 (two) times daily. Patient not taking: Reported on 10/28/2019 06/20/19 10/27/28  Hughie ClossPahwani, Ravi, MD  Multiple Vitamin (MULTIVITAMINS PO) Take 1 tablet by mouth daily.    [provider]  omeprazole (PRILOSEC) 20 MG capsule Take 1 capsule (20 mg total) by mouth daily. 04/06/20   Antony MaduraHumes, Savvy Peeters, PA-C  ondansetron (ZOFRAN ODT) 4 MG disintegrating tablet Take 1 tablet (4 mg total) by mouth every 8 (eight) hours as needed for nausea. Patient not taking: Reported on 10/28/2019 05/19/19   Jacalyn LefevreHaviland, Julie, MD  sucralfate (CARAFATE) 1 g tablet Take 1 tablet (1 g total) by mouth 4 (four) times daily -  with meals and at bedtime. 04/06/20   Antony MaduraHumes, Sesilia Poucher, PA-C  famotidine (PEPCID) 20 MG tablet Take 1 tablet (20 mg total) by mouth daily. Patient not taking: Reported on 10/28/2019 05/19/19 04/06/20  Jacalyn LefevreHaviland, Julie, MD    Allergies    Patient has no known allergies.  Review of Systems   Review of Systems  Ten systems reviewed and are negative for acute change, except as noted in the HPI.    Physical Exam Updated Vital Signs BP 118/74   Pulse 89   Temp 98 F (36.7 C)   Resp 14   Ht 5\' 5"  (1.651 m)   Wt 72.6 kg   LMP 06/19/2013   SpO2 99%   BMI 26.63 kg/m   Physical Exam Vitals and  nursing note reviewed.  Constitutional:      General: She is not in acute distress.    Appearance: She is well-developed and well-nourished. She is not diaphoretic.     Comments: Nontoxic appearing and in NAD  HENT:     Head: Normocephalic and atraumatic.  Eyes:     General: No scleral icterus.    Extraocular Movements: EOM normal.     Conjunctiva/sclera: Conjunctivae normal.  Cardiovascular:     Rate and Rhythm: Regular rhythm. Tachycardia present.     Pulses: Normal pulses.     Comments: Mild tachycardia Pulmonary:     Effort: Pulmonary effort is normal. No respiratory distress.     Comments:  Respirations even and unlabored Abdominal:     Comments: RUQ and LUQ TTP, mild. No guarding, peritoneal signs. Abdomen soft.  Musculoskeletal:        General: Normal range of motion.     Cervical back: Normal range of motion.  Skin:    General: Skin is warm and dry.     Coloration: Skin is not pale.     Findings: No erythema or rash.  Neurological:     Mental Status: She is alert and oriented to person, place, and time.     Coordination: Coordination normal.  Psychiatric:        Mood and Affect: Mood and affect normal.        Behavior: Behavior normal.     ED Results / Procedures / Treatments   Labs (all labs ordered are listed, but only abnormal results are displayed) Labs Reviewed  COMPREHENSIVE METABOLIC PANEL - Abnormal; Notable for the following components:      Result Value   Potassium 2.8 (*)    Chloride 95 (*)    CO2 21 (*)    Glucose, Bld 125 (*)    BUN <5 (*)    AST 93 (*)    ALT 81 (*)    Total Bilirubin 1.3 (*)    Anion gap 20 (*)    All other components within normal limits  CBC - Abnormal; Notable for the following components:   WBC 3.0 (*)    All other components within normal limits  URINALYSIS, ROUTINE W REFLEX MICROSCOPIC - Abnormal; Notable for the following components:   Color, Urine AMBER (*)    APPearance CLOUDY (*)    Glucose, UA 50 (*)    Hgb  urine dipstick SMALL (*)    Ketones, ur 20 (*)    Protein, ur >=300 (*)    All other components within normal limits  LIPASE, BLOOD  BASIC METABOLIC PANEL  I-STAT BETA HCG BLOOD, ED ([facility], [facility], [facility] ONLY)    EKG EKG Interpretation  Date/Time:  Monday April 05 2020 13:35:51 EST Ventricular Rate:  105 PR Interval:  160 QRS Duration: 76 QT Interval:  432 QTC Calculation: 570 R Axis:   75 Text Interpretation: ** Critical Test Result: Long QTc Sinus tachycardia ST & T wave abnormality, consider inferior ischemia ST & T wave abnormality, consider anterior ischemia Prolonged QT Abnormal ECG Interpretation limited secondary to artifact Confirmed by Zadie Rhine ([id number]) on 04/06/2020 12:30:31 AM    EKG Interpretation  Date/Time:  Tuesday April 06 2020 05:04:09 EST Ventricular Rate:  86 PR Interval:  122 QRS Duration: 82 QT Interval:  430 QTC Calculation: 514 R Axis:   76 Text Interpretation: Normal sinus rhythm Nonspecific T wave abnormality Prolonged QT Abnormal ECG Confirmed by Zadie Rhine ([id number]) on 04/06/2020 5:17:29 AM       Radiology US Abdomen Complete  Result Date: 04/06/2020 CLINICAL DATA:  Initial evaluation for acute abdominal pain. EXAM: ABDOMEN ULTRASOUND COMPLETE COMPARISON:  Comparison made with prior CT from 06/17/2019. FINDINGS: Gallbladder: No gallstones or wall thickening visualized. No sonographic Murphy sign noted by sonographer. Common bile duct: Diameter: 3.5 mm Liver: Liver demonstrates a coarse echogenic echotexture. 8 x 5 x 8 mm anechoic cystic lesion seen within the right hepatic lobe near the hepatic dome, likely a small simple cyst. Portal vein is patent on color Doppler imaging with normal direction of blood flow towards the liver. IVC: No abnormality visualized. Pancreas: Visualized portion unremarkable. Spleen: Size and appearance within normal limits. Right  Kidney: Length: 10.2 cm. Echogenicity within normal limits. No mass or hydronephrosis  visualized. Left Kidney: Length: 10.1 cm. Echogenicity within normal limits. No mass or hydronephrosis visualized. Abdominal aorta: No aneurysm visualized. Other findings: None. IMPRESSION: 1. Diffusely increased echogenicity within the hepatic parenchyma, nonspecific, but can be seen in setting of steatosis or other intrinsic hepatocellular disease. Correlation with LFTs suggested. 2. Normal sonographic appearance of the gallbladder. No cholelithiasis or evidence for acute cholecystitis. No biliary dilatation. 3. 8 mm simple cyst within the right hepatic lobe. 4. Otherwise unremarkable abdominal ultrasound. Electronically Signed   By: Rise Mu M.D.   On: 04/06/2020 02:58    Procedures Procedures (including critical care time)  Medications Ordered in ED Medications  sodium chloride 0.9 % bolus 1,000 mL (0 mLs Intravenous Stopped 04/06/20 0239)  potassium chloride 10 mEq in 100 mL IVPB (0 mEq Intravenous Stopped 04/06/20 0239)  potassium chloride SA (KLOR-CON) CR tablet 40 mEq (40 mEq Oral Given 04/06/20 0122)  alum & mag hydroxide-simeth (MAALOX/MYLANTA) 200-200-20 MG/5ML suspension 30 mL (30 mLs Oral Given 04/06/20 0121)    And  lidocaine (XYLOCAINE) 2 % viscous mouth solution 15 mL (15 mLs Oral Given 04/06/20 0121)  ketorolac (TORADOL) 15 MG/ML injection 15 mg (15 mg Intravenous Given 04/06/20 0119)  metoCLOPramide (REGLAN) injection 10 mg (10 mg Intravenous Given 04/06/20 0119)  pantoprazole (PROTONIX) injection 40 mg (40 mg Intravenous Given 04/06/20 0244)  sodium chloride 0.9 % bolus 1,000 mL (0 mLs Intravenous Stopped 04/06/20 0631)  LORazepam (ATIVAN) tablet 1 mg (1 mg Oral Given 04/06/20 0448)  thiamine tablet 100 mg (100 mg Oral Given 04/06/20 5784)    ED Course  I have reviewed the triage vital signs and the nursing notes.  Pertinent labs & imaging results that were available during my care of the patient were reviewed by me and considered in my medical decision making  (see chart for details).  Clinical Course as of 04/06/20 2219  Tue Apr 06, 2020  6962 Patient updated on [place] results; verbalizes understanding.  Pending repeat BMP to assess anion gap.  The patient's vital signs have improved.  On repeat assessment, she does appear tremulous.  She states that this occurs when she has not been drinking.  Will give a tablet of Ativan.  She is requesting a taper of Librium at discharge. [KH]    Clinical Course User Index [KH] Antony Madura, PA-C   MDM Rules/Calculators/A&P                          44 year old female presents to the emergency department for upper abdominal pain over the past few days with nausea and vomiting. Suspect symptoms to be secondary to gastritis. She has had improvement in the ED with IV fluids, Protonix, Reglan, GI cocktail. Was found to have hypokalemia and secondary EKG changes with QT prolongation. This has improved with potassium repletion. Repeat potassium 3.5 with correction of anion gap.   She has been hydrated with IV fluids. No vomiting since my assessment of the patient in the ED. Ultrasound shows changes to the liver which are likely chronic and related to her history of alcohol dependence. No gallstones or other acute findings. Lipase normal; doubt pancreatitis. She has no acute surgical abdomen. Repeat abdominal exams have remained stable throughout ED course.   Plan for discharge on Prilosec, Carafate. She has also requested a Librium taper to help her discontinue alcohol use. This was provided. Encouraged follow-up with a  gastroenterologist. Referral given.  Return precautions discussed and provided. Patient discharged in stable condition with no unaddressed concerns.   Final Clinical Impression(s) / ED Diagnoses Final diagnoses:  Acute gastritis, presence of bleeding unspecified, unspecified gastritis type  Hypokalemia  Alcohol dependence with unspecified alcohol-induced disorder (HCC)    Rx / DC Orders ED Discharge  Orders         Ordered    sucralfate (CARAFATE) 1 g tablet  3 times daily with meals & bedtime        04/06/20 0628    omeprazole (PRILOSEC) 20 MG capsule  Daily        04/06/20 0628    chlordiazePOXIDE (LIBRIUM) 25 MG capsule        04/06/20 0628           Antony Madura, PA-C 04/06/20 2223    Zadie Rhine, MD 04/06/20 2325

## 2020-04-06 NOTE — Discharge Instructions (Signed)
We suspect that your symptoms are related to inflammation of your stomach lining.  Take Prilosec and Carafate as prescribed.  Avoid spicy foods, coffee, chocolate, alcohol, citrus fruits.  You have been given a referral to gastroenterology to schedule outpatient follow-up.  This would be beneficial.  Your potassium was low in the emergency department and was corrected with potassium tablets and IV potassium.  Try to increase the potassium in your diet.  You have been given a prescription for Librium to help assist with detox from alcohol.  Return to the ED for new or concerning symptoms.

## 2020-08-31 ENCOUNTER — Ambulatory Visit (INDEPENDENT_AMBULATORY_CARE_PROVIDER_SITE_OTHER): Payer: 59

## 2020-08-31 ENCOUNTER — Encounter (HOSPITAL_COMMUNITY): Payer: Self-pay

## 2020-08-31 ENCOUNTER — Other Ambulatory Visit: Payer: Self-pay

## 2020-08-31 ENCOUNTER — Ambulatory Visit (HOSPITAL_COMMUNITY)
Admission: EM | Admit: 2020-08-31 | Discharge: 2020-08-31 | Disposition: A | Discharge status: Home / Self Care | Payer: 59 | Attending: Emergency Medicine | Admitting: Emergency Medicine

## 2020-08-31 DIAGNOSIS — M25531 Pain in right wrist: Secondary | ICD-10-CM | POA: Diagnosis not present

## 2020-08-31 DIAGNOSIS — M19031 Primary osteoarthritis, right wrist: Secondary | ICD-10-CM

## 2020-08-31 DIAGNOSIS — S63501A Unspecified sprain of right wrist, initial encounter: Secondary | ICD-10-CM | POA: Diagnosis not present

## 2020-08-31 MED ORDER — NAPROXEN 500 MG PO TABS: 500.0000 mg | ORAL_TABLET | Freq: Two times a day (BID) | ORAL | 0 refills | Status: DC

## 2020-08-31 NOTE — Discharge Instructions (Addendum)
Take the naproxen twice a day as needed for pain.   Rest as much as possible Ice for 10-15 minutes every 4-6 hours as needed for pain and swelling Compression- use an ace bandage or splint for comfort Elevate above your heart when sitting and laying down  Follow up with sports medicine or orthopedics if symptoms do not improve in the next few days.

## 2020-08-31 NOTE — ED Provider Notes (Signed)
[facility]    CSN: [id number] Arrival date & time: 08/31/20  1751      History   Chief Complaint Chief Complaint  Patient presents with  . Hand Pain  . Wrist Pain    HPI Danielle Reilly is a 45 y.o. female.   Patient here for evaluation of right wrist pain that has been ongoing for the past 2 weeks.  Reports boxing for exercise.  Reports pain is constant and achy worse with movement.  Has tried using a splint and taking OTC pain medications with minimal relief.  Reports decreased range of motion due to pain.   Denies any fevers, chest pain, shortness of breath, N/V/D, numbness, tingling, weakness, abdominal pain, or headaches.     The history is provided by the patient.    Past Medical History:  Diagnosis Date  . Anemia   . GERD (gastroesophageal reflux disease)    tums as needed  . Hemoperitoneum 12/25/2010  . SVD (spontaneous vaginal delivery)    x 1  . Toothache 01/29/2013    Patient Active Problem List   Diagnosis Date Noted  . Alcohol withdrawal (HCC) 06/17/2019  . Hypokalemia 06/17/2019  . Nausea and vomiting 06/17/2019  . Tachycardia 06/17/2019  . Increased anion gap metabolic acidosis 06/17/2019  . GERD (gastroesophageal reflux disease) 06/17/2019  . Gingivitis, acute 01/29/2013  . Abnormal uterine bleeding 11/29/2012  . Hemorrhagic ovarian cyst 12/26/2010    Past Surgical History:  Procedure Laterality Date  . BILATERAL SALPINGECTOMY N/A 06/19/2013   Procedure: BILATERAL SALPINGECTOMY;  Surgeon: Willodean Rosenthal, MD;  Location: [facility];  Service: Gynecology;  Laterality: N/A;  . DILITATION & CURRETTAGE/HYSTROSCOPY WITH NOVASURE ABLATION N/A 04/04/2013   Procedure: DILATATION & CURETTAGE/HYSTEROSCOPY WITH Attempted NOVASURE ABLATION ;  Surgeon: Willodean Rosenthal, MD;  Location: [facility];  Service: Gynecology;  Laterality: N/A;  . HYSTEROSCOPY N/A 05/26/2013   Procedure: UNSUCCESSFUL HYSTEROSCOPY WITH HYDROTHERMAL ABLATION;  Surgeon: Willodean Rosenthal, MD;  Location: [facility];  Service: Gynecology;  Laterality: N/A;  . LAPAROSCOPY  12/25/2010   Procedure: LAPAROSCOPY OPERATIVE;  Surgeon: Roseanna Rainbow, MD;  Location: [facility];  Service: Gynecology;  Laterality: N/A;  Operative laparoscopy /cauterization of right hemorrhagic cyst wall. Removal of belly rings.  Marland Kitchen LAPAROSCOPY FOR ECTOPIC PREGNANCY    . NOVASURE ABLATION N/A 05/26/2013   Procedure: UNSUCCESSFUL NOVASURE ABLATION;  Surgeon: Willodean Rosenthal, MD;  Location: [facility];  Service: Gynecology;  Laterality: N/A;  . uterine biopsy    . VAGINAL HYSTERECTOMY N/A 06/19/2013   Procedure:  TOTAL VAGINAL HYSTERECTOMY with bilateral salpingectomy;  Surgeon: Willodean Rosenthal, MD;  Location: [facility];  Service: Gynecology;  Laterality: N/A;    OB History    Gravida  4   Para  1   Term  1   Preterm      AB  3   Living  1     SAB  1   IAB  2   Ectopic  0   Multiple      Live Births  1            Home Medications    Prior to Admission medications   Medication Sig Start Date End Date Taking? Authorizing Provider  naproxen (NAPROSYN) 500 MG tablet Take 1 tablet (500 mg total) by mouth 2 (two) times daily. 08/31/20  Yes Ivette Loyal, NP  benzonatate (TESSALON) 100 MG capsule Take 2 capsules (200 mg total) by mouth 3 (three) times daily as needed. Patient not  taking: Reported on 10/28/2019 07/02/19   Anders Simmonds, PA-C  cetirizine (ZYRTEC) 10 MG tablet Take 10 mg by mouth daily as needed for allergies.    [provider]  chlordiazePOXIDE (LIBRIUM) 25 MG capsule 50mg  PO TID x 1D, then 25-50mg  PO BID X 1D, then 25-50mg  PO QD X 1D 04/06/20   04/08/20, PA-C  gabapentin (NEURONTIN) 300 MG capsule Take 1 capsule (300 mg total) by mouth 2 (two) times daily. Patient not taking: Reported on 10/28/2019 06/20/19 10/27/28  10/29/28, MD  Multiple Vitamin (MULTIVITAMINS PO) Take 1 tablet by mouth daily.    [provider]  omeprazole  (PRILOSEC) 20 MG capsule Take 1 capsule (20 mg total) by mouth daily. 04/06/20   04/08/20, PA-C  ondansetron (ZOFRAN ODT) 4 MG disintegrating tablet Take 1 tablet (4 mg total) by mouth every 8 (eight) hours as needed for nausea. Patient not taking: Reported on 10/28/2019 05/19/19   07/17/19, MD  sucralfate (CARAFATE) 1 g tablet Take 1 tablet (1 g total) by mouth 4 (four) times daily -  with meals and at bedtime. 04/06/20   04/08/20, PA-C  famotidine (PEPCID) 20 MG tablet Take 1 tablet (20 mg total) by mouth daily. Patient not taking: Reported on 10/28/2019 05/19/19 04/06/20  04/08/20, MD    Family History Family History  Problem Relation Age of Onset  . Drug abuse Mother   . Arthritis Maternal Aunt   . Cancer Maternal Aunt   . Drug abuse Maternal Aunt   . Arthritis Maternal Uncle   . Alcohol abuse Maternal Uncle   . Cancer Maternal Uncle   . Drug abuse Maternal Uncle   . Cancer Maternal Grandmother   . Alcohol abuse Maternal Grandfather   . Cancer Maternal Grandfather     Social History Social History   Tobacco Use  . Smoking status: Never Smoker  . Smokeless tobacco: Never Used  Vaping Use  . Vaping Use: Never used  Substance Use Topics  . Alcohol use: Yes    Comment: 1 + pint of liquor per day  . Drug use: Yes    Types: Benzodiazepines     Allergies   Patient has no known allergies.   Review of Systems Review of Systems  Musculoskeletal: Positive for arthralgias and joint swelling.  All other systems reviewed and are negative.    Physical Exam Triage Vital Signs ED Triage Vitals  Enc Vitals Group     BP 08/31/20 1835 117/67     Pulse Rate 08/31/20 1833 70     Resp 08/31/20 1833 17     Temp 08/31/20 1833 98.5 F (36.9 C)     Temp Source 08/31/20 1833 Oral     SpO2 08/31/20 1833 100 %     Weight --      Height --      Head Circumference --      Peak Flow --      Pain Score 08/31/20 1832 8     Pain Loc --      Pain Edu? --      Excl.  in GC? --    No data found.  Updated Vital Signs BP 117/67   Pulse 70   Temp 98.5 F (36.9 C) (Oral)   Resp 17   LMP 06/19/2013   SpO2 100%   Visual Acuity Right Eye Distance:   Left Eye Distance:   Bilateral Distance:    Right Eye Near:   Left  Eye Near:    Bilateral Near:     Physical Exam Vitals and nursing note reviewed.  Constitutional:      General: She is not in acute distress.    Appearance: Normal appearance. She is not ill-appearing, toxic-appearing or diaphoretic.  HENT:     Head: Normocephalic and atraumatic.  Eyes:     Conjunctiva/sclera: Conjunctivae normal.  Cardiovascular:     Rate and Rhythm: Normal rate.     Pulses: Normal pulses.  Pulmonary:     Effort: Pulmonary effort is normal.  Abdominal:     General: Abdomen is flat.  Musculoskeletal:     Right wrist: Tenderness, bony tenderness and snuff box tenderness present. Decreased range of motion. Normal pulse.     Cervical back: Normal range of motion.  Skin:    General: Skin is warm and dry.  Neurological:     General: No focal deficit present.     Mental Status: She is alert and oriented to person, place, and time.  Psychiatric:        Mood and Affect: Mood normal.      UC Treatments / Results  Labs (all labs ordered are listed, but only abnormal results are displayed) Labs Reviewed - No data to display  EKG   Radiology DG Wrist Complete Right  Result Date: 08/31/2020 CLINICAL DATA:  Pain and swelling EXAM: RIGHT WRIST - COMPLETE 3+ VIEW COMPARISON:  None. FINDINGS: There is no evidence of fracture or dislocation. There is no evidence of arthropathy or other focal bone abnormality. Soft tissues are unremarkable. IMPRESSION: Negative. Electronically Signed   By: Jasmine Pang M.D.   On: 08/31/2020 19:49    Procedures Procedures (including critical care time)  Medications Ordered in UC Medications - No data to display  Initial Impression / Assessment and Plan / UC Course  I have  reviewed the triage vital signs and the nursing notes.  Pertinent labs & imaging results that were available during my care of the patient were reviewed by me and considered in my medical decision making (see chart for details).    Assessment negative for red flags or concerns.  X-ray negative for any bony abnormalities.  Likely wrist sprain or strain.  Naproxen twice daily as needed for pain.  May also take Tylenol as needed for pain.  Encourage rest, ice, compression with a wrist splint, and elevation.  If symptoms do not improve in the next week recommend following up with orthopedics. Final Clinical Impressions(s) / UC Diagnoses   Final diagnoses:  Sprain of right wrist, initial encounter  Right wrist pain     Discharge Instructions     Take the naproxen twice a day as needed for pain.   Rest as much as possible Ice for 10-15 minutes every 4-6 hours as needed for pain and swelling Compression- use an ace bandage or splint for comfort Elevate above your heart when sitting and laying down  Follow up with sports medicine or orthopedics if symptoms do not improve in the next few days.      ED Prescriptions    Medication Sig Dispense Auth. Provider   naproxen (NAPROSYN) 500 MG tablet Take 1 tablet (500 mg total) by mouth 2 (two) times daily. 30 tablet Ivette Loyal, NP     PDMP not reviewed this encounter.   Ivette Loyal, NP 08/31/20 2007

## 2020-08-31 NOTE — ED Triage Notes (Signed)
Pt presents with pain to the right hand. She states she does kick and hand boxing. Pt states she believes that is where the pain is coming from. She is requesting an x-ray.

## 2020-10-22 ENCOUNTER — Other Ambulatory Visit: Payer: Self-pay

## 2020-10-22 ENCOUNTER — Encounter (HOSPITAL_COMMUNITY): Payer: Self-pay | Admitting: Emergency Medicine

## 2020-10-22 ENCOUNTER — Ambulatory Visit (INDEPENDENT_AMBULATORY_CARE_PROVIDER_SITE_OTHER): Payer: 59

## 2020-10-22 ENCOUNTER — Ambulatory Visit (HOSPITAL_COMMUNITY)
Admission: EM | Admit: 2020-10-22 | Discharge: 2020-10-22 | Disposition: A | Discharge status: Home / Self Care | Payer: 59 | Attending: Emergency Medicine | Admitting: Emergency Medicine

## 2020-10-22 DIAGNOSIS — M25531 Pain in right wrist: Secondary | ICD-10-CM | POA: Diagnosis not present

## 2020-10-22 DIAGNOSIS — S63501A Unspecified sprain of right wrist, initial encounter: Secondary | ICD-10-CM | POA: Diagnosis not present

## 2020-10-22 MED ORDER — MELOXICAM 7.5 MG PO TABS: 7.5000 mg | ORAL_TABLET | Freq: Every day | ORAL | 0 refills | Status: DC

## 2020-10-22 NOTE — ED Triage Notes (Signed)
Hurt right wrist in May. Last night went to hit a fly with the fly-swatter. States she felt something "tweak" and began having a sharp pain that radiates up into arm with tingling in fingers. Radial/ulnar pulses palpable, hand warm dry with good coloring. No visible deformity/injury in triage.

## 2020-10-22 NOTE — Discharge Instructions (Addendum)
Take the Meloxicam daily.  You can also take Tylenol as needed for pain and fevers.    If you are not able to tolerate the Meloxicam, take Ibuprofen 2-3 pills (600-800mg ) 3 times a day.    Rest as much as possible Ice for 10-15 minutes every 4-6 hours as needed for pain and swelling Compression- use an ace bandage or splint for comfort Elevate above your hip/heart when sitting and laying down  Follow up with sports medicine or orthopedics if symptoms do not improve in the next few days.

## 2020-10-22 NOTE — ED Provider Notes (Signed)
[facility]    CSN: [id number] Arrival date & time: 10/22/20  1712      History   Chief Complaint No chief complaint on file.   HPI Danielle Reilly is a 45 y.o. female.   Patient here for evaluation of right wrist pain.  Reports being evaluated and diagnosed with a right wrist sprain.  Reports pain has been intermittent since but has mostly improved.  Reports went to use a fly-swatter last night and had a sharp, shooting pain in the right wrist that radiated up her arm.  Reports unable to make a fist or move thumb due to pain.  Denies any fevers, chest pain, shortness of breath, N/V/D, numbness, tingling, weakness, abdominal pain, or headaches.     The history is provided by the patient.   Past Medical History:  Diagnosis Date   Anemia    GERD (gastroesophageal reflux disease)    tums as needed   Hemoperitoneum 12/25/2010   SVD (spontaneous vaginal delivery)    x 1   Toothache 01/29/2013    Patient Active Problem List   Diagnosis Date Noted   Alcohol withdrawal (HCC) 06/17/2019   Hypokalemia 06/17/2019   Nausea and vomiting 06/17/2019   Tachycardia 06/17/2019   Increased anion gap metabolic acidosis 06/17/2019   GERD (gastroesophageal reflux disease) 06/17/2019   Gingivitis, acute 01/29/2013   Abnormal uterine bleeding 11/29/2012   Hemorrhagic ovarian cyst 12/26/2010    Past Surgical History:  Procedure Laterality Date   BILATERAL SALPINGECTOMY N/A 06/19/2013   Procedure: BILATERAL SALPINGECTOMY;  Surgeon: Willodean Rosenthal, MD;  Location: [facility];  Service: Gynecology;  Laterality: N/A;   DILITATION & CURRETTAGE/HYSTROSCOPY WITH NOVASURE ABLATION N/A 04/04/2013   Procedure: DILATATION & CURETTAGE/HYSTEROSCOPY WITH Attempted NOVASURE ABLATION ;  Surgeon: Willodean Rosenthal, MD;  Location: [facility];  Service: Gynecology;  Laterality: N/A;   HYSTEROSCOPY N/A 05/26/2013   Procedure: UNSUCCESSFUL HYSTEROSCOPY WITH HYDROTHERMAL ABLATION;  Surgeon: Willodean Rosenthal, MD;  Location: [facility];  Service: Gynecology;  Laterality: N/A;   LAPAROSCOPY  12/25/2010   Procedure: LAPAROSCOPY OPERATIVE;  Surgeon: Roseanna Rainbow, MD;  Location: [facility];  Service: Gynecology;  Laterality: N/A;  Operative laparoscopy /cauterization of right hemorrhagic cyst wall. Removal of belly rings.   LAPAROSCOPY FOR ECTOPIC PREGNANCY     NOVASURE ABLATION N/A 05/26/2013   Procedure: UNSUCCESSFUL NOVASURE ABLATION;  Surgeon: Willodean Rosenthal, MD;  Location: [facility];  Service: Gynecology;  Laterality: N/A;   uterine biopsy     VAGINAL HYSTERECTOMY N/A 06/19/2013   Procedure:  TOTAL VAGINAL HYSTERECTOMY with bilateral salpingectomy;  Surgeon: Willodean Rosenthal, MD;  Location: [facility];  Service: Gynecology;  Laterality: N/A;    OB History     Gravida  4   Para  1   Term  1   Preterm      AB  3   Living  1      SAB  1   IAB  2   Ectopic  0   Multiple      Live Births  1            Home Medications    Prior to Admission medications   Medication Sig Start Date End Date Taking? Authorizing Provider  meloxicam (MOBIC) 7.5 MG tablet Take 1 tablet (7.5 mg total) by mouth daily. 10/22/20  Yes Ivette Loyal, NP  benzonatate (TESSALON) 100 MG capsule Take 2 capsules (200 mg total) by mouth 3 (three) times daily as needed. Patient not taking:  Reported on 10/28/2019 07/02/19   Anders Simmonds, PA-C  cetirizine (ZYRTEC) 10 MG tablet Take 10 mg by mouth daily as needed for allergies.    [provider]  chlordiazePOXIDE (LIBRIUM) 25 MG capsule 50mg  PO TID x 1D, then 25-50mg  PO BID X 1D, then 25-50mg  PO QD X 1D 04/06/20   04/08/20, PA-C  gabapentin (NEURONTIN) 300 MG capsule Take 1 capsule (300 mg total) by mouth 2 (two) times daily. Patient not taking: Reported on 10/28/2019 06/20/19 10/27/28  10/29/28, MD  Multiple Vitamin (MULTIVITAMINS PO) Take 1 tablet by mouth daily.    [provider]  omeprazole (PRILOSEC) 20 MG  capsule Take 1 capsule (20 mg total) by mouth daily. 04/06/20   04/08/20, PA-C  ondansetron (ZOFRAN ODT) 4 MG disintegrating tablet Take 1 tablet (4 mg total) by mouth every 8 (eight) hours as needed for nausea. Patient not taking: Reported on 10/28/2019 05/19/19   07/17/19, MD  sucralfate (CARAFATE) 1 g tablet Take 1 tablet (1 g total) by mouth 4 (four) times daily -  with meals and at bedtime. 04/06/20   04/08/20, PA-C  famotidine (PEPCID) 20 MG tablet Take 1 tablet (20 mg total) by mouth daily. Patient not taking: Reported on 10/28/2019 05/19/19 04/06/20  04/08/20, MD    Family History Family History  Problem Relation Age of Onset   Drug abuse Mother    Arthritis Maternal Aunt    Cancer Maternal Aunt    Drug abuse Maternal Aunt    Arthritis Maternal Uncle    Alcohol abuse Maternal Uncle    Cancer Maternal Uncle    Drug abuse Maternal Uncle    Cancer Maternal Grandmother    Alcohol abuse Maternal Grandfather    Cancer Maternal Grandfather     Social History Social History   Tobacco Use   Smoking status: Never   Smokeless tobacco: Never  Vaping Use   Vaping Use: Never used  Substance Use Topics   Alcohol use: Yes    Comment: 1 + pint of liquor per day   Drug use: Yes    Types: Benzodiazepines     Allergies   Hydrocodone   Review of Systems Review of Systems  Musculoskeletal:  Positive for arthralgias and joint swelling.  All other systems reviewed and are negative.   Physical Exam Triage Vital Signs ED Triage Vitals [10/22/20 1737]  Enc Vitals Group     BP 121/62     Pulse Rate 93     Resp 14     Temp 98.5 F (36.9 C)     Temp Source Oral     SpO2 100 %     Weight      Height      Head Circumference      Peak Flow      Pain Score 8     Pain Loc      Pain Edu?      Excl. in GC?    No data found.  Updated Vital Signs BP 121/62 (BP Location: Right Arm)   Pulse 93   Temp 98.5 F (36.9 C) (Oral)   Resp 14   LMP 06/19/2013    SpO2 100%   Visual Acuity Right Eye Distance:   Left Eye Distance:   Bilateral Distance:    Right Eye Near:   Left Eye Near:    Bilateral Near:     Physical Exam Vitals and nursing note reviewed.  Constitutional:  General: She is not in acute distress.    Appearance: Normal appearance. She is not ill-appearing, toxic-appearing or diaphoretic.  HENT:     Head: Normocephalic and atraumatic.  Eyes:     Conjunctiva/sclera: Conjunctivae normal.  Cardiovascular:     Rate and Rhythm: Normal rate.     Pulses: Normal pulses.  Pulmonary:     Effort: Pulmonary effort is normal.  Abdominal:     General: Abdomen is flat.  Musculoskeletal:     Right wrist: Tenderness and bony tenderness present. No snuff box tenderness or crepitus. Decreased range of motion. Normal pulse.     Left wrist: Normal.     Cervical back: Normal range of motion.  Skin:    General: Skin is warm and dry.  Neurological:     General: No focal deficit present.     Mental Status: She is alert and oriented to person, place, and time.  Psychiatric:        Mood and Affect: Mood normal.     UC Treatments / Results  Labs (all labs ordered are listed, but only abnormal results are displayed) Labs Reviewed - No data to display  EKG   Radiology DG Wrist Complete Right  Result Date: 10/22/2020 CLINICAL DATA:  Swelling with decreased range of motion. Trauma in May. EXAM: RIGHT WRIST - COMPLETE 3+ VIEW COMPARISON:  08/31/2020 FINDINGS: No acute fracture or dislocation. No significant soft tissue swelling. Joint spaces maintained. Scaphoid intact. IMPRESSION: No acute osseous abnormality. Electronically Signed   By: Jeronimo GreavesKyle  Talbot M.D.   On: 10/22/2020 18:40    Procedures Procedures (including critical care time)  Medications Ordered in UC Medications - No data to display  Initial Impression / Assessment and Plan / UC Course  I have reviewed the triage vital signs and the nursing notes.  Pertinent labs &  imaging results that were available during my care of the patient were reviewed by me and considered in my medical decision making (see chart for details).    Assessment negative for red flags for concerns.  X-ray with no acute bony abnormalities.  This is likely a right wrist sprain.  Patient has been prescribed naproxen at her last visit but states that she was unable to take it due to an upset stomach.  We will try meloxicam daily for pain relief.  If she is unable to take meloxicam recommend ibuprofen 600 to 800 mg 3 times a day for pain and inflammation.  Continue to wear the wrist splint for comfort.  Recommend rest, ice, compression, and elevation.  Recommend following up with orthopedics as wrist still causing problems.  Patient given contact information for several different orthopedic offices for follow-up. Final Clinical Impressions(s) / UC Diagnoses   Final diagnoses:  Sprain of right wrist, initial encounter     Discharge Instructions      Take the Meloxicam daily.  You can also take Tylenol as needed for pain and fevers.    If you are not able to tolerate the Meloxicam, take Ibuprofen 2-3 pills (600-800mg ) 3 times a day.    Rest as much as possible Ice for 10-15 minutes every 4-6 hours as needed for pain and swelling Compression- use an ace bandage or splint for comfort Elevate above your hip/heart when sitting and laying down  Follow up with sports medicine or orthopedics if symptoms do not improve in the next few days.      ED Prescriptions     Medication Sig Dispense Auth. Provider  meloxicam (MOBIC) 7.5 MG tablet Take 1 tablet (7.5 mg total) by mouth daily. 30 tablet Ivette Loyal, NP      PDMP not reviewed this encounter.   Ivette Loyal, NP 10/22/20 [phone]

## 2020-11-03 ENCOUNTER — Ambulatory Visit (HOSPITAL_COMMUNITY)
Admission: EM | Admit: 2020-11-03 | Discharge: 2020-11-03 | Disposition: A | Discharge status: Home / Self Care | Payer: 59 | Attending: Internal Medicine | Admitting: Internal Medicine

## 2020-11-03 ENCOUNTER — Other Ambulatory Visit: Payer: Self-pay

## 2020-11-03 ENCOUNTER — Encounter (HOSPITAL_COMMUNITY): Payer: Self-pay

## 2020-11-03 DIAGNOSIS — T7840XA Allergy, unspecified, initial encounter: Secondary | ICD-10-CM | POA: Diagnosis not present

## 2020-11-03 DIAGNOSIS — L500 Allergic urticaria: Secondary | ICD-10-CM

## 2020-11-03 MED ORDER — HYDROXYZINE HCL 25 MG PO TABS: 25.0000 mg | ORAL_TABLET | Freq: Four times a day (QID) | ORAL | 0 refills | Status: DC | PRN

## 2020-11-03 MED ORDER — PREDNISONE 20 MG PO TABS: 40.0000 mg | ORAL_TABLET | Freq: Every day | ORAL | 0 refills | Status: AC

## 2020-11-03 NOTE — Discharge Instructions (Addendum)
You have been prescribed prednisone steroid and hydroxyzine antihistamine to take for allergic reaction.  Please take hydroxyzine as needed for itching.  Please do not take any over-the-counter cetirizine (zyrtec), loratadine (claritin), or Benadryl while taking hydroxyzine.

## 2020-11-03 NOTE — ED Provider Notes (Signed)
[facility]    CSN: [id number] Arrival date & time: 11/03/20  1414      History   Chief Complaint Chief Complaint  Patient presents with   Facial Redness/Blotchiness   Allergic Reaction    HPI Danielle Reilly is a 45 y.o. female.   Patient presents with 1 day history of itchy and "stinging" rash that is present to forehead, neck, right forearm.  Patient states that she took fluconazole yesterday for prevention of yeast infection after going to the beach and forehead became "tingly" directly after.  Then, patient woke up this morning with redness and inflammation to forehead and spots to forehead, neck, right forearm.  Denies any changes to soaps, lotions, detergents, foods, etc.  Patient states that this same reaction has occurred before when taking fluconazole.  Denies any difficulty breathing.   Allergic Reaction  Past Medical History:  Diagnosis Date   Anemia    GERD (gastroesophageal reflux disease)    tums as needed   Hemoperitoneum 12/25/2010   SVD (spontaneous vaginal delivery)    x 1   Toothache 01/29/2013    Patient Active Problem List   Diagnosis Date Noted   Alcohol withdrawal (HCC) 06/17/2019   Hypokalemia 06/17/2019   Nausea and vomiting 06/17/2019   Tachycardia 06/17/2019   Increased anion gap metabolic acidosis 06/17/2019   GERD (gastroesophageal reflux disease) 06/17/2019   Gingivitis, acute 01/29/2013   Abnormal uterine bleeding 11/29/2012   Hemorrhagic ovarian cyst 12/26/2010    Past Surgical History:  Procedure Laterality Date   BILATERAL SALPINGECTOMY N/A 06/19/2013   Procedure: BILATERAL SALPINGECTOMY;  Surgeon: Willodean Rosenthal, MD;  Location: [facility];  Service: Gynecology;  Laterality: N/A;   DILITATION & CURRETTAGE/HYSTROSCOPY WITH NOVASURE ABLATION N/A 04/04/2013   Procedure: DILATATION & CURETTAGE/HYSTEROSCOPY WITH Attempted NOVASURE ABLATION ;  Surgeon: Willodean Rosenthal, MD;  Location: [facility];  Service: Gynecology;   Laterality: N/A;   HYSTEROSCOPY N/A 05/26/2013   Procedure: UNSUCCESSFUL HYSTEROSCOPY WITH HYDROTHERMAL ABLATION;  Surgeon: Willodean Rosenthal, MD;  Location: [facility];  Service: Gynecology;  Laterality: N/A;   LAPAROSCOPY  12/25/2010   Procedure: LAPAROSCOPY OPERATIVE;  Surgeon: Roseanna Rainbow, MD;  Location: [facility];  Service: Gynecology;  Laterality: N/A;  Operative laparoscopy /cauterization of right hemorrhagic cyst wall. Removal of belly rings.   LAPAROSCOPY FOR ECTOPIC PREGNANCY     NOVASURE ABLATION N/A 05/26/2013   Procedure: UNSUCCESSFUL NOVASURE ABLATION;  Surgeon: Willodean Rosenthal, MD;  Location: [facility];  Service: Gynecology;  Laterality: N/A;   uterine biopsy     VAGINAL HYSTERECTOMY N/A 06/19/2013   Procedure:  TOTAL VAGINAL HYSTERECTOMY with bilateral salpingectomy;  Surgeon: Willodean Rosenthal, MD;  Location: [facility];  Service: Gynecology;  Laterality: N/A;    OB History     Gravida  4   Para  1   Term  1   Preterm      AB  3   Living  1      SAB  1   IAB  2   Ectopic  0   Multiple      Live Births  1            Home Medications    Prior to Admission medications   Medication Sig Start Date End Date Taking? Authorizing Provider  hydrOXYzine (ATARAX/VISTARIL) 25 MG tablet Take 1 tablet (25 mg total) by mouth every 6 (six) hours as needed for itching. 11/03/20  Yes Lance Muss, FNP  predniSONE (DELTASONE) 20 MG tablet Take  2 tablets (40 mg total) by mouth daily for 5 days. 11/03/20 11/08/20 Yes Lance Muss, FNP  benzonatate (TESSALON) 100 MG capsule Take 2 capsules (200 mg total) by mouth 3 (three) times daily as needed. Patient not taking: Reported on 10/28/2019 07/02/19   Anders Simmonds, PA-C  cetirizine (ZYRTEC) 10 MG tablet Take 10 mg by mouth daily as needed for allergies.    [provider]  chlordiazePOXIDE (LIBRIUM) 25 MG capsule 50mg  PO TID x 1D, then 25-50mg  PO BID X 1D, then 25-50mg  PO QD X 1D 04/06/20   04/08/20, PA-C  gabapentin (NEURONTIN) 300 MG capsule Take 1 capsule (300 mg total) by mouth 2 (two) times daily. Patient not taking: Reported on 10/28/2019 06/20/19 10/27/28  10/29/28, MD  meloxicam (MOBIC) 7.5 MG tablet Take 1 tablet (7.5 mg total) by mouth daily. 10/22/20   12/23/20, NP  Multiple Vitamin (MULTIVITAMINS PO) Take 1 tablet by mouth daily.    [provider]  omeprazole (PRILOSEC) 20 MG capsule Take 1 capsule (20 mg total) by mouth daily. 04/06/20   04/08/20, PA-C  ondansetron (ZOFRAN ODT) 4 MG disintegrating tablet Take 1 tablet (4 mg total) by mouth every 8 (eight) hours as needed for nausea. Patient not taking: Reported on 10/28/2019 05/19/19   07/17/19, MD  sucralfate (CARAFATE) 1 g tablet Take 1 tablet (1 g total) by mouth 4 (four) times daily -  with meals and at bedtime. 04/06/20   04/08/20, PA-C  famotidine (PEPCID) 20 MG tablet Take 1 tablet (20 mg total) by mouth daily. Patient not taking: Reported on 10/28/2019 05/19/19 04/06/20  04/08/20, MD    Family History Family History  Problem Relation Age of Onset   Drug abuse Mother    Arthritis Maternal Aunt    Cancer Maternal Aunt    Drug abuse Maternal Aunt    Arthritis Maternal Uncle    Alcohol abuse Maternal Uncle    Cancer Maternal Uncle    Drug abuse Maternal Uncle    Cancer Maternal Grandmother    Alcohol abuse Maternal Grandfather    Cancer Maternal Grandfather     Social History Social History   Tobacco Use   Smoking status: Never   Smokeless tobacco: Never  Vaping Use   Vaping Use: Never used  Substance Use Topics   Alcohol use: Yes    Comment: 1 + pint of liquor per day   Drug use: Yes    Types: Benzodiazepines     Allergies   Hydrocodone   Review of Systems Review of Systems Per HPI  Physical Exam Triage Vital Signs ED Triage Vitals  Enc Vitals Group     BP 11/03/20 1451 101/67     Pulse Rate 11/03/20 1451 76     Resp 11/03/20 1451 18     Temp  11/03/20 1451 98.2 F (36.8 C)     Temp Source 11/03/20 1451 Oral     SpO2 11/03/20 1451 97 %     Weight --      Height --      Head Circumference --      Peak Flow --      Pain Score 11/03/20 1505 0     Pain Loc --      Pain Edu? --      Excl. in GC? --    No data found.  Updated Vital Signs BP 101/67   Pulse 76   Temp 98.2 F (  36.8 C) (Oral)   Resp 18   LMP 06/19/2013   SpO2 97%   Visual Acuity Right Eye Distance:   Left Eye Distance:   Bilateral Distance:    Right Eye Near:   Left Eye Near:    Bilateral Near:     Physical Exam Constitutional:      General: She is not in acute distress.    Appearance: Normal appearance.  HENT:     Head: Normocephalic and atraumatic.  Eyes:     Extraocular Movements: Extraocular movements intact.     Conjunctiva/sclera: Conjunctivae normal.  Pulmonary:     Effort: Pulmonary effort is normal.  Skin:    General: Skin is warm and dry.     Findings: Rash present. Rash is urticarial.     Comments: Multiple purpuric, urticarial approximately 1 inch in diameter lesions to forehead.  Lesion also present to left lateral neck and right forearm.  Neurological:     General: No focal deficit present.     Mental Status: She is alert and oriented to person, place, and time. Mental status is at baseline.  Psychiatric:        Mood and Affect: Mood normal.        Behavior: Behavior normal.        Thought Content: Thought content normal.        Judgment: Judgment normal.     UC Treatments / Results  Labs (all labs ordered are listed, but only abnormal results are displayed) Labs Reviewed - No data to display  EKG   Radiology No results found.  Procedures Procedures (including critical care time)  Medications Ordered in UC Medications - No data to display  Initial Impression / Assessment and Plan / UC Course  I have reviewed the triage vital signs and the nursing notes.  Pertinent labs & imaging results that were available  during my care of the patient were reviewed by me and considered in my medical decision making (see chart for details).     Suspect allergic reaction due to similar rash occurring in the past with correlation with taking fluconazole.  Will treat with prednisone x5 days to decrease inflammation and hydroxyzine to take as needed and to treat allergic reaction.  Patient was advised not take any other over-the-counter histamines while taking hydroxyzine.  Patient was advised that hydroxyzine can cause drowsiness.  Patient to follow-up if symptoms do not improve and to go to the hospital if they worsen.  Discussed strict return precautions. Patient verbalized understanding and is agreeable with plan.  Final Clinical Impressions(s) / UC Diagnoses   Final diagnoses:  Allergic reaction, initial encounter  Allergic urticaria     Discharge Instructions      You have been prescribed prednisone steroid and hydroxyzine antihistamine to take for allergic reaction.  Please take hydroxyzine as needed for itching.  Please do not take any over-the-counter cetirizine (zyrtec), loratadine (claritin), or Benadryl while taking hydroxyzine.      ED Prescriptions     Medication Sig Dispense Auth. Provider   predniSONE (DELTASONE) 20 MG tablet Take 2 tablets (40 mg total) by mouth daily for 5 days. 10 tablet Lance Muss, FNP   hydrOXYzine (ATARAX/VISTARIL) 25 MG tablet Take 1 tablet (25 mg total) by mouth every 6 (six) hours as needed for itching. 12 tablet Lance Muss, FNP      PDMP not reviewed this encounter.   Lance Muss, FNP 11/03/20 [phone]

## 2020-11-03 NOTE — ED Triage Notes (Signed)
Pt presents with itchiness & blotchiness all over. Pt has rash like spots on her face and neck.

## 2020-12-06 ENCOUNTER — Encounter: Payer: Self-pay | Admitting: Emergency Medicine

## 2020-12-06 ENCOUNTER — Other Ambulatory Visit: Payer: Self-pay

## 2020-12-06 ENCOUNTER — Ambulatory Visit (INDEPENDENT_AMBULATORY_CARE_PROVIDER_SITE_OTHER): Payer: 59 | Admitting: Emergency Medicine

## 2020-12-06 VITALS — BP 116/75 | HR 91 | Temp 98.4°F | Ht 65.0 in | Wt 175.0 lb

## 2020-12-06 DIAGNOSIS — R21 Rash and other nonspecific skin eruption: Secondary | ICD-10-CM | POA: Insufficient documentation

## 2020-12-06 DIAGNOSIS — F1011 Alcohol abuse, in remission: Secondary | ICD-10-CM | POA: Diagnosis not present

## 2020-12-06 DIAGNOSIS — F411 Generalized anxiety disorder: Secondary | ICD-10-CM | POA: Diagnosis not present

## 2020-12-06 DIAGNOSIS — Z8719 Personal history of other diseases of the digestive system: Secondary | ICD-10-CM

## 2020-12-06 DIAGNOSIS — Z7689 Persons encountering health services in other specified circumstances: Secondary | ICD-10-CM

## 2020-12-06 LAB — CBC WITH DIFFERENTIAL/PLATELET
Basophils Absolute: 0.1 10*3/uL (ref 0.0–0.1)
Basophils Relative: 1.3 % (ref 0.0–3.0)
Eosinophils Absolute: 0 10*3/uL (ref 0.0–0.7)
Eosinophils Relative: 0.2 % (ref 0.0–5.0)
HCT: 35.9 % — ABNORMAL LOW (ref 36.0–46.0)
Hemoglobin: 11.7 g/dL — ABNORMAL LOW (ref 12.0–15.0)
Lymphocytes Relative: 37.3 % (ref 12.0–46.0)
Lymphs Abs: 1.6 10*3/uL (ref 0.7–4.0)
MCHC: 32.5 g/dL (ref 30.0–36.0)
MCV: 88.3 fl (ref 78.0–100.0)
Monocytes Absolute: 0.4 10*3/uL (ref 0.1–1.0)
Monocytes Relative: 8.2 % (ref 3.0–12.0)
Neutro Abs: 2.3 10*3/uL (ref 1.4–7.7)
Neutrophils Relative %: 53 % (ref 43.0–77.0)
Platelets: 236 10*3/uL (ref 150.0–400.0)
RBC: 4.07 Mil/uL (ref 3.87–5.11)
RDW: 14.5 % (ref 11.5–15.5)
WBC: 4.4 10*3/uL (ref 4.0–10.5)

## 2020-12-06 LAB — COMPREHENSIVE METABOLIC PANEL
ALT: 28 U/L (ref 0–35)
AST: 33 U/L (ref 0–37)
Albumin: 4.3 g/dL (ref 3.5–5.2)
Alkaline Phosphatase: 51 U/L (ref 39–117)
BUN: 9 mg/dL (ref 6–23)
CO2: 25 mEq/L (ref 19–32)
Calcium: 9.5 mg/dL (ref 8.4–10.5)
Chloride: 102 mEq/L (ref 96–112)
Creatinine, Ser: 0.84 mg/dL (ref 0.40–1.20)
GFR: 84.2 mL/min (ref >60.00)
Glucose, Bld: 78 mg/dL (ref 70–99)
Potassium: 3.8 mEq/L (ref 3.5–5.1)
Sodium: 139 mEq/L (ref 135–145)
Total Bilirubin: 0.4 mg/dL (ref 0.2–1.2)
Total Protein: 7.5 g/dL (ref 6.0–8.3)

## 2020-12-06 LAB — HEMOGLOBIN A1C: Hgb A1c MFr Bld: 5.5 % (ref 4.6–6.5)

## 2020-12-06 LAB — LIPID PANEL
Cholesterol: 196 mg/dL (ref 0–200)
HDL: 95 mg/dL (ref >39.00)
LDL Cholesterol: 75 mg/dL (ref 0–99)
NonHDL: 101.43
Total CHOL/HDL Ratio: 2
Triglycerides: 133 mg/dL (ref 0.0–149.0)
VLDL: 26.6 mg/dL (ref 0.0–40.0)

## 2020-12-06 LAB — TSH: TSH: 1.1 u[IU]/mL (ref 0.35–5.50)

## 2020-12-06 NOTE — Assessment & Plan Note (Signed)
Significantly affecting quality of life.  Does not want medication.  Needs psychiatric evaluation.  Referral placed today.

## 2020-12-06 NOTE — Progress Notes (Signed)
Danielle Reilly 45 y.o.   Chief Complaint  Patient presents with   New Patient (Initial Visit)    Pt would like a referral to dermatology.    HISTORY OF PRESENT ILLNESS: This is a 45 y.o. female first visit to this office here to establish care with me. Has the following chronic medical problems: 1.  History of gastritis and GERD.  Requesting GI referral.  May need upper endoscopy. 2.  Generalized anxiety disorder.  On no medications.  Needs psychiatry referral. 3.  History of chronic EtOH abuse which she states is now under control. 4.  Recent right wrist injury for which she has been taking meloxicam 5.  Skin lesions, requesting dermatology referral. No other complaints or medical concerns today.  HPI   Prior to Admission medications   Medication Sig Start Date End Date Taking? Authorizing Provider  meloxicam (MOBIC) 7.5 MG tablet Take 1 tablet (7.5 mg total) by mouth daily. 10/22/20  Yes Ivette Loyal, NP  Multiple Vitamin (MULTIVITAMINS PO) Take 1 tablet by mouth daily.   Yes [provider]  sucralfate (CARAFATE) 1 g tablet Take 1 tablet (1 g total) by mouth 4 (four) times daily -  with meals and at bedtime. 04/06/20  Yes Antony Madura, PA-C  famotidine (PEPCID) 20 MG tablet Take 1 tablet (20 mg total) by mouth daily. Patient not taking: Reported on 10/28/2019 05/19/19 04/06/20  Jacalyn Lefevre, MD    Allergies  Allergen Reactions   Hydrocodone Nausea And Vomiting    Patient Active Problem List   Diagnosis Date Noted   GERD (gastroesophageal reflux disease) 06/17/2019    Past Medical History:  Diagnosis Date   Anemia    GERD (gastroesophageal reflux disease)    tums as needed   Hemoperitoneum 12/25/2010   SVD (spontaneous vaginal delivery)    x 1   Toothache 01/29/2013    Past Surgical History:  Procedure Laterality Date   BILATERAL SALPINGECTOMY N/A 06/19/2013   Procedure: BILATERAL SALPINGECTOMY;  Surgeon: Willodean Rosenthal, MD;  Location: [facility];   Service: Gynecology;  Laterality: N/A;   DILITATION & CURRETTAGE/HYSTROSCOPY WITH NOVASURE ABLATION N/A 04/04/2013   Procedure: DILATATION & CURETTAGE/HYSTEROSCOPY WITH Attempted NOVASURE ABLATION ;  Surgeon: Willodean Rosenthal, MD;  Location: [facility];  Service: Gynecology;  Laterality: N/A;   HYSTEROSCOPY N/A 05/26/2013   Procedure: UNSUCCESSFUL HYSTEROSCOPY WITH HYDROTHERMAL ABLATION;  Surgeon: Willodean Rosenthal, MD;  Location: [facility];  Service: Gynecology;  Laterality: N/A;   LAPAROSCOPY  12/25/2010   Procedure: LAPAROSCOPY OPERATIVE;  Surgeon: Roseanna Rainbow, MD;  Location: [facility];  Service: Gynecology;  Laterality: N/A;  Operative laparoscopy /cauterization of right hemorrhagic cyst wall. Removal of belly rings.   LAPAROSCOPY FOR ECTOPIC PREGNANCY     NOVASURE ABLATION N/A 05/26/2013   Procedure: UNSUCCESSFUL NOVASURE ABLATION;  Surgeon: Willodean Rosenthal, MD;  Location: [facility];  Service: Gynecology;  Laterality: N/A;   uterine biopsy     VAGINAL HYSTERECTOMY N/A 06/19/2013   Procedure:  TOTAL VAGINAL HYSTERECTOMY with bilateral salpingectomy;  Surgeon: Willodean Rosenthal, MD;  Location: [facility];  Service: Gynecology;  Laterality: N/A;    Social History   Socioeconomic History   Marital status: Divorced    Spouse name: Not on file   Number of children: Not on file   Years of education: Not on file   Highest education level: Not on file  Occupational History   Not on file  Tobacco Use   Smoking status: Never   Smokeless tobacco: Never  Vaping Use   Vaping Use: Never used  Substance and Sexual Activity   Alcohol use: Yes    Comment: 1 + pint of liquor per day   Drug use: Yes    Types: Benzodiazepines   Sexual activity: Yes    Birth control/protection: Surgical  Other Topics Concern   Not on file  Social History Narrative   [place]  She is unemployed.  She is not followed by a psychiatrist.   Social Determinants of Health    Financial Resource Strain: Not on file  Food Insecurity: Not on file  Transportation Needs: Not on file  Physical Activity: Not on file  Stress: Not on file  Social Connections: Not on file  Intimate Partner Violence: Not on file    Family History  Problem Relation Age of Onset   Drug abuse Mother    Arthritis Maternal Aunt    Cancer Maternal Aunt    Drug abuse Maternal Aunt    Arthritis Maternal Uncle    Alcohol abuse Maternal Uncle    Cancer Maternal Uncle    Drug abuse Maternal Uncle    Cancer Maternal Grandmother    Alcohol abuse Maternal Grandfather    Cancer Maternal Grandfather      Review of Systems  Constitutional: Negative.  Negative for chills and fever.  HENT: Negative.  Negative for congestion and sore throat.   Respiratory: Negative.  Negative for cough and shortness of breath.   Cardiovascular:  Negative for chest pain and palpitations.  Gastrointestinal:  Negative for abdominal pain, diarrhea, melena, nausea and vomiting.  Genitourinary:  Negative for dysuria and hematuria.  Skin:  Positive for rash.  Neurological: Negative.  Negative for dizziness and headaches.  Psychiatric/Behavioral:  Positive for depression. The patient is nervous/anxious.   All other systems reviewed and are negative.   Physical Exam Vitals reviewed.  Constitutional:      Appearance: Normal appearance.  HENT:     Head: Normocephalic.     Mouth/Throat:     Mouth: Mucous membranes are moist.     Pharynx: Oropharynx is clear.  Eyes:     Extraocular Movements: Extraocular movements intact.     Conjunctiva/sclera: Conjunctivae normal.     Pupils: Pupils are equal, round, and reactive to light.  Cardiovascular:     Rate and Rhythm: Normal rate and regular rhythm.     Pulses: Normal pulses.     Heart sounds: Normal heart sounds.  Pulmonary:     Effort: Pulmonary effort is normal.     Breath sounds: Normal breath sounds.  Musculoskeletal:        General: Normal range of  motion.     Cervical back: Normal range of motion and neck supple.     Right lower leg: No edema.     Left lower leg: No edema.  Skin:    General: Skin is warm and dry.     Capillary Refill: Capillary refill takes less than 2 seconds.     Comments: Several flat hyperpigmented lesions on right forehead left arm  Neurological:     General: No focal deficit present.     Mental Status: She is alert and oriented to person, place, and time.  Psychiatric:        Mood and Affect: Mood normal.        Behavior: Behavior normal.     ASSESSMENT & PLAN: A total of 45 minutes was spent with the patient and counseling/coordination of  care regarding preparing for this visit, review of all medical records available in epic, review of past medical history, comprehensive history and physical examination, need for blood work, need for psychiatric evaluation, establishing care with me, education on nutrition, health maintenance items, prognosis, documentation and need for follow-up.   Generalized anxiety disorder Significantly affecting quality of life.  Does not want medication.  Needs psychiatric evaluation.  Referral placed today.  History of alcohol abuse In remission as per patient.  History of gastritis Asymptomatic.  No obvious GI bleed.  Rash and nonspecific skin eruption Referral placed to dermatology.  Kiely was seen today for new patient (initial visit).  Diagnoses and all orders for this visit:  Generalized anxiety disorder -     CBC with Differential/Platelet -     Comprehensive metabolic panel -     Hemoglobin A1c -     Lipid panel -     TSH -     Ambulatory referral to Psychiatry  Encounter to establish care  History of gastritis  History of alcohol abuse  Rash and nonspecific skin eruption -     Ambulatory referral to Dermatology  Patient Instructions  Health Maintenance, Female Adopting a healthy lifestyle and getting preventive care are important in promoting  health and wellness. Ask your health care provider about: The right schedule for you to have regular tests and exams. Things you can do on your own to prevent diseases and keep yourself healthy. What should I know about diet, weight, and exercise? Eat a healthy diet  Eat a diet that includes plenty of vegetables, fruits, low-fat dairy products, and lean protein. Do not eat a lot of foods that are high in solid fats, added sugars, or sodium.  Maintain a healthy weight Body mass index (BMI) is used to identify weight problems. It estimates body fat based on height and weight. Your health care provider can help determineyour BMI and help you achieve or maintain a healthy weight. Get regular exercise Get regular exercise. This is one of the most important things you can do for your health. Most adults should: Exercise for at least 150 minutes each week. The exercise should increase your heart rate and make you sweat (moderate-intensity exercise). Do strengthening exercises at least twice a week. This is in addition to the moderate-intensity exercise. Spend less time sitting. Even light physical activity can be beneficial. Watch cholesterol and blood lipids Have your blood tested for lipids and cholesterol at 45 years of age, then havethis test every 5 years. Have your cholesterol levels checked more often if: Your lipid or cholesterol levels are high. You are older than 45 years of age. You are at high risk for heart disease. What should I know about cancer screening? Depending on your health history and family history, you may need to have cancer screening at various ages. This may include screening for: Breast cancer. Cervical cancer. Colorectal cancer. Skin cancer. Lung cancer. What should I know about heart disease, diabetes, and high blood pressure? Blood pressure and heart disease High blood pressure causes heart disease and increases the risk of stroke. This is more likely to  develop in people who have high blood pressure readings, are of African descent, or are overweight. Have your blood pressure checked: Every 3-5 years if you are 60-20 years of age. Every year if you are 55 years old or older. Diabetes Have regular diabetes screenings. This checks your fasting blood sugar level. Have the screening done: Once every three years  after age 29 if you are at a normal weight and have a low risk for diabetes. More often and at a younger age if you are overweight or have a high risk for diabetes. What should I know about preventing infection? Hepatitis B If you have a higher risk for hepatitis B, you should be screened for this virus. Talk with your health care provider to find out if you are at risk forhepatitis B infection. Hepatitis C Testing is recommended for: Everyone born from [birth year] through 1965. Anyone with known risk factors for hepatitis C. Sexually transmitted infections (STIs) Get screened for STIs, including gonorrhea and chlamydia, if: You are sexually active and are younger than 45 years of age. You are older than 45 years of age and your health care provider tells you that you are at risk for this type of infection. Your sexual activity has changed since you were last screened, and you are at increased risk for chlamydia or gonorrhea. Ask your health care provider if you are at risk. Ask your health care provider about whether you are at high risk for HIV. Your health care provider may recommend a prescription medicine to help prevent HIV infection. If you choose to take medicine to prevent HIV, you should first get tested for HIV. You should then be tested every 3 months for as long as you are taking the medicine. Pregnancy If you are about to stop having your period (premenopausal) and you may become pregnant, seek counseling before you get pregnant. Take 400 to 800 micrograms (mcg) of folic acid every day if you become pregnant. Ask for birth control  (contraception) if you want to prevent pregnancy. Osteoporosis and menopause Osteoporosis is a disease in which the bones lose minerals and strength with aging. This can result in bone fractures. If you are 5 years old or older, or if you are at risk for osteoporosis and fractures, ask your health care provider if you should: Be screened for bone loss. Take a calcium or vitamin D supplement to lower your risk of fractures. Be given hormone replacement therapy (HRT) to treat symptoms of menopause. Follow these instructions at home: Lifestyle Do not use any products that contain nicotine or tobacco, such as cigarettes, e-cigarettes, and chewing tobacco. If you need help quitting, ask your health care provider. Do not use street drugs. Do not share needles. Ask your health care provider for help if you need support or information about quitting drugs. Alcohol use Do not drink alcohol if: Your health care provider tells you not to drink. You are pregnant, may be pregnant, or are planning to become pregnant. If you drink alcohol: Limit how much you use to 0-1 drink a day. Limit intake if you are breastfeeding. Be aware of how much alcohol is in your drink. In the [place] one drink equals one 12 oz bottle of beer (355 mL), one 5 oz glass of wine (148 mL), or one 1 oz glass of hard liquor (44 mL). General instructions Schedule regular health, dental, and eye exams. Stay current with your vaccines. Tell your health care provider if: You often feel depressed. You have ever been abused or do not feel safe at home. Summary Adopting a healthy lifestyle and getting preventive care are important in promoting health and wellness. Follow your health care provider's instructions about healthy diet, exercising, and getting tested or screened for diseases. Follow your health care provider's instructions on monitoring your cholesterol and blood pressure. This information is not intended  to replace advice  given to you by your health care provider. Make sure you discuss any questions you have with your healthcare provider. Document Revised: 03/27/2018 Document Reviewed: 03/27/2018 Elsevier Patient Education  2022 Elsevier Inc.   Edwina Barth, MD [facility]

## 2020-12-06 NOTE — Assessment & Plan Note (Signed)
Asymptomatic.  No obvious GI bleed.

## 2020-12-06 NOTE — Assessment & Plan Note (Signed)
In remission as per patient.

## 2020-12-06 NOTE — Assessment & Plan Note (Signed)
Referral placed to dermatology

## 2020-12-06 NOTE — Patient Instructions (Signed)
Health Maintenance, Female Adopting a healthy lifestyle and getting preventive care are important in promoting health and wellness. Ask your health care provider about: The right schedule for you to have regular tests and exams. Things you can do on your own to prevent diseases and keep yourself healthy. What should I know about diet, weight, and exercise? Eat a healthy diet  Eat a diet that includes plenty of vegetables, fruits, low-fat dairy products, and lean protein. Do not eat a lot of foods that are high in solid fats, added sugars, or sodium.  Maintain a healthy weight Body mass index (BMI) is used to identify weight problems. It estimates body fat based on height and weight. Your health care provider can help determineyour BMI and help you achieve or maintain a healthy weight. Get regular exercise Get regular exercise. This is one of the most important things you can do for your health. Most adults should: Exercise for at least 150 minutes each week. The exercise should increase your heart rate and make you sweat (moderate-intensity exercise). Do strengthening exercises at least twice a week. This is in addition to the moderate-intensity exercise. Spend less time sitting. Even light physical activity can be beneficial. Watch cholesterol and blood lipids Have your blood tested for lipids and cholesterol at 45 years of age, then havethis test every 5 years. Have your cholesterol levels checked more often if: Your lipid or cholesterol levels are high. You are older than 45 years of age. You are at high risk for heart disease. What should I know about cancer screening? Depending on your health history and family history, you may need to have cancer screening at various ages. This may include screening for: Breast cancer. Cervical cancer. Colorectal cancer. Skin cancer. Lung cancer. What should I know about heart disease, diabetes, and high blood pressure? Blood pressure and heart  disease High blood pressure causes heart disease and increases the risk of stroke. This is more likely to develop in people who have high blood pressure readings, are of African descent, or are overweight. Have your blood pressure checked: Every 3-5 years if you are 18-39 years of age. Every year if you are 40 years old or older. Diabetes Have regular diabetes screenings. This checks your fasting blood sugar level. Have the screening done: Once every three years after age 40 if you are at a normal weight and have a low risk for diabetes. More often and at a younger age if you are overweight or have a high risk for diabetes. What should I know about preventing infection? Hepatitis B If you have a higher risk for hepatitis B, you should be screened for this virus. Talk with your health care provider to find out if you are at risk forhepatitis B infection. Hepatitis C Testing is recommended for: Everyone born from 1945 through 1965. Anyone with known risk factors for hepatitis C. Sexually transmitted infections (STIs) Get screened for STIs, including gonorrhea and chlamydia, if: You are sexually active and are younger than 45 years of age. You are older than 45 years of age and your health care provider tells you that you are at risk for this type of infection. Your sexual activity has changed since you were last screened, and you are at increased risk for chlamydia or gonorrhea. Ask your health care provider if you are at risk. Ask your health care provider about whether you are at high risk for HIV. Your health care provider may recommend a prescription medicine to help   prevent HIV infection. If you choose to take medicine to prevent HIV, you should first get tested for HIV. You should then be tested every 3 months for as long as you are taking the medicine. Pregnancy If you are about to stop having your period (premenopausal) and you may become pregnant, seek counseling before you get  pregnant. Take 400 to 800 micrograms (mcg) of folic acid every day if you become pregnant. Ask for birth control (contraception) if you want to prevent pregnancy. Osteoporosis and menopause Osteoporosis is a disease in which the bones lose minerals and strength with aging. This can result in bone fractures. If you are 65 years old or older, or if you are at risk for osteoporosis and fractures, ask your health care provider if you should: Be screened for bone loss. Take a calcium or vitamin D supplement to lower your risk of fractures. Be given hormone replacement therapy (HRT) to treat symptoms of menopause. Follow these instructions at home: Lifestyle Do not use any products that contain nicotine or tobacco, such as cigarettes, e-cigarettes, and chewing tobacco. If you need help quitting, ask your health care provider. Do not use street drugs. Do not share needles. Ask your health care provider for help if you need support or information about quitting drugs. Alcohol use Do not drink alcohol if: Your health care provider tells you not to drink. You are pregnant, may be pregnant, or are planning to become pregnant. If you drink alcohol: Limit how much you use to 0-1 drink a day. Limit intake if you are breastfeeding. Be aware of how much alcohol is in your drink. In the [place] one drink equals one 12 oz bottle of beer (355 mL), one 5 oz glass of wine (148 mL), or one 1 oz glass of hard liquor (44 mL). General instructions Schedule regular health, dental, and eye exams. Stay current with your vaccines. Tell your health care provider if: You often feel depressed. You have ever been abused or do not feel safe at home. Summary Adopting a healthy lifestyle and getting preventive care are important in promoting health and wellness. Follow your health care provider's instructions about healthy diet, exercising, and getting tested or screened for diseases. Follow your health care provider's  instructions on monitoring your cholesterol and blood pressure. This information is not intended to replace advice given to you by your health care provider. Make sure you discuss any questions you have with your healthcare provider. Document Revised: 03/27/2018 Document Reviewed: 03/27/2018 Elsevier Patient Education  2022 Elsevier Inc.  

## 2020-12-13 ENCOUNTER — Encounter: Payer: Self-pay | Admitting: Emergency Medicine

## 2020-12-13 DIAGNOSIS — Z8719 Personal history of other diseases of the digestive system: Secondary | ICD-10-CM

## 2020-12-17 NOTE — Telephone Encounter (Signed)
Called and spoke with pt about lab results and referrals that were order. Patient verb understanding.

## 2021-01-26 ENCOUNTER — Emergency Department (HOSPITAL_COMMUNITY)
Admission: EM | Admit: 2021-01-26 | Discharge: 2021-01-26 | Disposition: A | Discharge status: Home / Self Care | Payer: 59 | Attending: Emergency Medicine | Admitting: Emergency Medicine

## 2021-01-26 ENCOUNTER — Encounter (HOSPITAL_COMMUNITY): Payer: Self-pay | Admitting: Emergency Medicine

## 2021-01-26 DIAGNOSIS — R112 Nausea with vomiting, unspecified: Secondary | ICD-10-CM | POA: Diagnosis present

## 2021-01-26 DIAGNOSIS — Z79899 Other long term (current) drug therapy: Secondary | ICD-10-CM | POA: Insufficient documentation

## 2021-01-26 DIAGNOSIS — F1010 Alcohol abuse, uncomplicated: Secondary | ICD-10-CM

## 2021-01-26 DIAGNOSIS — Y907 Blood alcohol level of 200-239 mg/100 ml: Secondary | ICD-10-CM | POA: Diagnosis not present

## 2021-01-26 DIAGNOSIS — R000 Tachycardia, unspecified: Secondary | ICD-10-CM | POA: Insufficient documentation

## 2021-01-26 DIAGNOSIS — R1013 Epigastric pain: Secondary | ICD-10-CM | POA: Diagnosis not present

## 2021-01-26 LAB — COMPREHENSIVE METABOLIC PANEL
ALT: 32 U/L (ref 0–44)
AST: 53 U/L — ABNORMAL HIGH (ref 15–41)
Albumin: 4.3 g/dL (ref 3.5–5.0)
Alkaline Phosphatase: 56 U/L (ref 38–126)
Anion gap: 13 (ref 5–15)
BUN: 5 mg/dL — ABNORMAL LOW (ref 6–20)
CO2: 22 mmol/L (ref 22–32)
Calcium: 10 mg/dL (ref 8.9–10.3)
Chloride: 98 mmol/L (ref 98–111)
Creatinine, Ser: 0.83 mg/dL (ref 0.44–1.00)
GFR, Estimated: >60 mL/min (ref >60)
Glucose, Bld: 98 mg/dL (ref 70–99)
Potassium: 3.6 mmol/L (ref 3.5–5.1)
Sodium: 133 mmol/L — ABNORMAL LOW (ref 135–145)
Total Bilirubin: 1.9 mg/dL — ABNORMAL HIGH (ref 0.3–1.2)
Total Protein: 7.8 g/dL (ref 6.5–8.1)

## 2021-01-26 LAB — CBC WITH DIFFERENTIAL/PLATELET
Abs Immature Granulocytes: 0.02 10*3/uL (ref 0.00–0.07)
Basophils Absolute: 0 10*3/uL (ref 0.0–0.1)
Basophils Relative: 1 %
Eosinophils Absolute: 0 10*3/uL (ref 0.0–0.5)
Eosinophils Relative: 0 %
HCT: 39 % (ref 36.0–46.0)
Hemoglobin: 12.9 g/dL (ref 12.0–15.0)
Immature Granulocytes: 0 %
Lymphocytes Relative: 22 %
Lymphs Abs: 1.6 10*3/uL (ref 0.7–4.0)
MCH: 29.2 pg (ref 26.0–34.0)
MCHC: 33.1 g/dL (ref 30.0–36.0)
MCV: 88.2 fL (ref 80.0–100.0)
Monocytes Absolute: 0.7 10*3/uL (ref 0.1–1.0)
Monocytes Relative: 10 %
Neutro Abs: 4.8 10*3/uL (ref 1.7–7.7)
Neutrophils Relative %: 67 %
Platelets: 243 10*3/uL (ref 150–400)
RBC: 4.42 MIL/uL (ref 3.87–5.11)
RDW: 13.4 % (ref 11.5–15.5)
WBC: 7.2 10*3/uL (ref 4.0–10.5)
nRBC: 0 % (ref 0.0–0.2)

## 2021-01-26 LAB — URINALYSIS, ROUTINE W REFLEX MICROSCOPIC
Bilirubin Urine: NEGATIVE
Glucose, UA: NEGATIVE mg/dL
Hgb urine dipstick: NEGATIVE
Ketones, ur: NEGATIVE mg/dL
Leukocytes,Ua: NEGATIVE
Nitrite: NEGATIVE
Protein, ur: NEGATIVE mg/dL
Specific Gravity, Urine: 1.002 — ABNORMAL LOW (ref 1.005–1.030)
pH: 6 (ref 5.0–8.0)

## 2021-01-26 LAB — I-STAT BETA HCG BLOOD, ED (MC, WL, AP ONLY): I-stat hCG, quantitative: <5 m[IU]/mL (ref <5)

## 2021-01-26 LAB — LIPASE, BLOOD: Lipase: 35 U/L (ref 11–51)

## 2021-01-26 LAB — ETHANOL: Alcohol, Ethyl (B): 205 mg/dL — ABNORMAL HIGH (ref <10)

## 2021-01-26 MED ORDER — SODIUM CHLORIDE 0.9 % IV BOLUS
1000.0000 mL | Freq: Once | INTRAVENOUS | Status: AC
  Administered 2021-01-26: 1000 mL via INTRAVENOUS

## 2021-01-26 MED ORDER — CHLORDIAZEPOXIDE HCL 25 MG PO CAPS
25.0000 mg | ORAL_CAPSULE | Freq: Once | ORAL | Status: AC
  Administered 2021-01-26: 25 mg via ORAL
  Filled 2021-01-26: qty 1

## 2021-01-26 MED ORDER — ONDANSETRON 4 MG PO TBDP
8.0000 mg | ORAL_TABLET | Freq: Once | ORAL | Status: AC
  Administered 2021-01-26: 8 mg via ORAL
  Filled 2021-01-26: qty 2

## 2021-01-26 MED ORDER — LORAZEPAM 2 MG/ML IJ SOLN
1.0000 mg | Freq: Once | INTRAMUSCULAR | Status: AC
  Administered 2021-01-26: 1 mg via INTRAVENOUS
  Filled 2021-01-26: qty 1

## 2021-01-26 MED ORDER — ALUM & MAG HYDROXIDE-SIMETH 200-200-20 MG/5ML PO SUSP
30.0000 mL | Freq: Once | ORAL | Status: AC
  Administered 2021-01-26: 30 mL via ORAL
  Filled 2021-01-26: qty 30

## 2021-01-26 MED ORDER — ONDANSETRON 4 MG PO TBDP: 4.0000 mg | ORAL_TABLET | Freq: Three times a day (TID) | ORAL | 0 refills | Status: DC | PRN

## 2021-01-26 MED ORDER — CHLORDIAZEPOXIDE HCL 25 MG PO CAPS: ORAL_CAPSULE | ORAL | 0 refills | Status: DC

## 2021-01-26 MED ORDER — LIDOCAINE VISCOUS HCL 2 % MT SOLN
15.0000 mL | Freq: Once | OROMUCOSAL | Status: AC
  Administered 2021-01-26: 15 mL via ORAL
  Filled 2021-01-26: qty 15

## 2021-01-26 NOTE — Discharge Instructions (Addendum)
Take Librium taper as prescribed.  If you develop worsening vomiting, tremulousness, anxiety, shakes, please return immediately to ER for reassessment.  Please follow-up with your primary care doctor.  Additionally recommend seeking further resources in the community regarding your alcohol abuse.

## 2021-01-26 NOTE — ED Triage Notes (Signed)
Pt states she has been binge drinking, last drink about 24 hrs ago. Unable to keep anything down, endorses abd pain, vomiting and diarrhea. Pt reports feeling very anxious.

## 2021-01-26 NOTE — ED Provider Notes (Signed)
Emergency Medicine Provider Triage Evaluation Note  Danielle Reilly , a 45 y.o. female  was evaluated in triage.  Pt complains of abdominal pain/alcohol withdrawal.  She abstain from alcohol for 6 to 8 months, had relapsed 2 weeks ago.  During copious amounts of alcohol, last drink 24 hours ago.  Unable to keep any food down, having nausea and vomiting.  Also endorses right upper quadrant abdominal pain.    Review of Systems  Positive: Abdominal pain, nausea, vomiting Negative: No diarrhea, mental status changes, seizures..  Physical Exam  BP (!) 130/108 (BP Location: Right Arm)   Pulse (!) 111   Temp 98.5 F (36.9 C) (Oral)   Resp 18   Ht 5\' 5"  (1.651 m)   LMP 06/19/2013   SpO2 100%   BMI 29.12 kg/m  Gen:   Awake, no distress   Resp:  Normal effort  MSK:   Moves extremities without difficulty  Other:  Right upper quadrant abdominal pain  Medical Decision Making  Medically screening exam initiated at 4:32 PM.  Appropriate orders placed.  Tramya Behne was informed that the remainder of the evaluation will be completed by another provider, this initial triage assessment does not replace that evaluation, and the importance of remaining in the ED until their evaluation is complete.     08/19/2013, PA-C 01/26/21 1633    03/28/21, MD 01/28/21 [id number]

## 2021-01-26 NOTE — ED Provider Notes (Signed)
[facility] EMERGENCY DEPARTMENT Provider Note   CSN: [id number] Arrival date & time: 01/26/21  1618     History Chief Complaint  Patient presents with   Alcohol Problem    Danielle Reilly is a 45 y.o. female.  Presents to ER with concern for alcohol abuse.  Patient reports that she has a history of alcohol abuse but lately seem to be having under control.  Over the past couple weeks however she has been on an alcohol binge.  Has been drinking quite heavily.  Then developed nausea, vomiting and difficulty keeping the alcohol down.  She wants to quit.  Feels somewhat nauseated right now also feels somewhat anxious.  No seizure-like activity.  No syncope.  HPI     Past Medical History:  Diagnosis Date   Anemia    GERD (gastroesophageal reflux disease)    tums as needed   Hemoperitoneum 12/25/2010   SVD (spontaneous vaginal delivery)    x 1   Toothache 01/29/2013    Patient Active Problem List   Diagnosis Date Noted   Generalized anxiety disorder 12/06/2020   History of gastritis 12/06/2020   History of alcohol abuse 12/06/2020   Rash and nonspecific skin eruption 12/06/2020   GERD (gastroesophageal reflux disease) 06/17/2019    Past Surgical History:  Procedure Laterality Date   BILATERAL SALPINGECTOMY N/A 06/19/2013   Procedure: BILATERAL SALPINGECTOMY;  Surgeon: Willodean Rosenthal, MD;  Location: [facility];  Service: Gynecology;  Laterality: N/A;   DILITATION & CURRETTAGE/HYSTROSCOPY WITH NOVASURE ABLATION N/A 04/04/2013   Procedure: DILATATION & CURETTAGE/HYSTEROSCOPY WITH Attempted NOVASURE ABLATION ;  Surgeon: Willodean Rosenthal, MD;  Location: [facility];  Service: Gynecology;  Laterality: N/A;   HYSTEROSCOPY N/A 05/26/2013   Procedure: UNSUCCESSFUL HYSTEROSCOPY WITH HYDROTHERMAL ABLATION;  Surgeon: Willodean Rosenthal, MD;  Location: [facility];  Service: Gynecology;  Laterality: N/A;   LAPAROSCOPY  12/25/2010   Procedure: LAPAROSCOPY OPERATIVE;  Surgeon:  Roseanna Rainbow, MD;  Location: [facility];  Service: Gynecology;  Laterality: N/A;  Operative laparoscopy /cauterization of right hemorrhagic cyst wall. Removal of belly rings.   LAPAROSCOPY FOR ECTOPIC PREGNANCY     NOVASURE ABLATION N/A 05/26/2013   Procedure: UNSUCCESSFUL NOVASURE ABLATION;  Surgeon: Willodean Rosenthal, MD;  Location[facility];  Service: Gynecology;  Laterality: N/A;   uterine biopsy     VAGINAL HYSTERECTOMY N/A 06/19/2013   Procedure:  TOTAL VAGINAL HYSTERECTOMY with bilateral salpingectomy;  Surgeon: Willodean Rosenthal, MD;  Location: [facility];  Service: Gynecology;  Laterality: N/A;     OB History     Gravida  4   Para  1   Term  1   Preterm      AB  3   Living  1      SAB  1   IAB  2   Ectopic  0   Multiple      Live Births  1           Family History  Problem Relation Age of Onset   Drug abuse Mother    Arthritis Maternal Aunt    Cancer Maternal Aunt    Drug abuse Maternal Aunt    Arthritis Maternal Uncle    Alcohol abuse Maternal Uncle    Cancer Maternal Uncle    Drug abuse Maternal Uncle    Cancer Maternal Grandmother    Alcohol abuse Maternal Grandfather    Cancer Maternal Grandfather     Social History   Tobacco Use   Smoking status:  Never   Smokeless tobacco: Never  Vaping Use   Vaping Use: Never used  Substance Use Topics   Alcohol use: Yes    Comment: 1 + pint of liquor per day   Drug use: Yes    Types: Benzodiazepines    Home Medications Prior to Admission medications   Medication Sig Start Date End Date Taking? Authorizing Provider  b complex vitamins capsule Take 1 capsule by mouth daily.   Yes [provider]  chlordiazePOXIDE (LIBRIUM) 25 MG capsule 50mg  PO TID x 1D, then 25-50mg  PO BID X 1D, then 25-50mg  PO QD X 1D 01/26/21  Yes Yonas Bunda, 03/28/21, MD  LORazepam (ATIVAN) 0.5 MG tablet Take 0.5 mg by mouth daily as needed for sleep.   Yes [provider]  Multiple Vitamin (MULTIVITAMINS  PO) Take 1 tablet by mouth daily.   Yes [provider]  ondansetron (ZOFRAN ODT) 4 MG disintegrating tablet Take 1 tablet (4 mg total) by mouth every 8 (eight) hours as needed for nausea or vomiting. 01/26/21  Yes 03/28/21, MD  meloxicam (MOBIC) 7.5 MG tablet Take 1 tablet (7.5 mg total) by mouth daily. Patient not taking: No sig reported 10/22/20   12/23/20, NP  sucralfate (CARAFATE) 1 g tablet Take 1 tablet (1 g total) by mouth 4 (four) times daily -  with meals and at bedtime. Patient not taking: No sig reported 04/06/20   04/08/20, PA-C  famotidine (PEPCID) 20 MG tablet Take 1 tablet (20 mg total) by mouth daily. Patient not taking: Reported on 10/28/2019 05/19/19 04/06/20  04/08/20, MD    Allergies    Hydrocodone  Review of Systems   Review of Systems  Constitutional:  Negative for chills and fever.  HENT:  Negative for ear pain and sore throat.   Eyes:  Negative for pain and visual disturbance.  Respiratory:  Negative for cough and shortness of breath.   Cardiovascular:  Negative for chest pain and palpitations.  Gastrointestinal:  Positive for abdominal pain, nausea and vomiting.  Genitourinary:  Negative for dysuria and hematuria.  Musculoskeletal:  Negative for arthralgias and back pain.  Skin:  Negative for color change and rash.  Neurological:  Negative for seizures and syncope.  All other systems reviewed and are negative.  Physical Exam Updated Vital Signs BP 114/71   Pulse (!) 109   Temp 98.5 F (36.9 C) (Oral)   Resp 20   Ht 5\' 5"  (1.651 m)   LMP 06/19/2013   SpO2 94%   BMI 29.12 kg/m   Physical Exam Vitals and nursing note reviewed.  Constitutional:      General: She is not in acute distress.    Appearance: She is well-developed.  HENT:     Head: Normocephalic and atraumatic.  Eyes:     Conjunctiva/sclera: Conjunctivae normal.  Cardiovascular:     Rate and Rhythm: Regular rhythm.     Pulses: Normal pulses.     Heart  sounds: No murmur heard.    Comments: Mild tachycardia Pulmonary:     Effort: Pulmonary effort is normal. No respiratory distress.     Breath sounds: Normal breath sounds.  Abdominal:     Palpations: Abdomen is soft.     Tenderness: There is no abdominal tenderness.     Comments: Some tenderness in epigastrium, there is no tenderness in the right upper quadrant, no right lower quadrant or left lower quadrant tenderness to palpation, no rebound or guarding  Musculoskeletal:  Cervical back: Neck supple.  Skin:    General: Skin is warm and dry.  Neurological:     General: No focal deficit present.     Mental Status: She is alert.  Psychiatric:        Mood and Affect: Mood normal.        Thought Content: Thought content normal.        Judgment: Judgment normal.    ED Results / Procedures / Treatments   Labs (all labs ordered are listed, but only abnormal results are displayed) Labs Reviewed  COMPREHENSIVE METABOLIC PANEL - Abnormal; Notable for the following components:      Result Value   Sodium 133 (*)    BUN 5 (*)    AST 53 (*)    Total Bilirubin 1.9 (*)    All other components within normal limits  URINALYSIS, ROUTINE W REFLEX MICROSCOPIC - Abnormal; Notable for the following components:   Color, Urine STRAW (*)    Specific Gravity, Urine 1.002 (*)    All other components within normal limits  ETHANOL - Abnormal; Notable for the following components:   Alcohol, Ethyl (B) 205 (*)    All other components within normal limits  CBC WITH DIFFERENTIAL/PLATELET  LIPASE, BLOOD  I-STAT BETA HCG BLOOD, ED ([facility], [facility], [facility] ONLY)    EKG None  Radiology No results found.  Procedures Procedures   Medications Ordered in ED Medications  chlordiazePOXIDE (LIBRIUM) capsule 25 mg (has no administration in time range)  ondansetron (ZOFRAN-ODT) disintegrating tablet 8 mg (8 mg Oral Given 01/26/21 1637)  LORazepam (ATIVAN) injection 1 mg (1 mg Intravenous Given 01/26/21 1953)   sodium chloride 0.9 % bolus 1,000 mL (1,000 mLs Intravenous New Bag/Given 01/26/21 1956)  alum & mag hydroxide-simeth (MAALOX/MYLANTA) 200-200-20 MG/5ML suspension 30 mL (30 mLs Oral Given 01/26/21 1951)    And  lidocaine (XYLOCAINE) 2 % viscous mouth solution 15 mL (15 mLs Oral Given 01/26/21 1951)    ED Course  I have reviewed the triage vital signs and the nursing notes.  Pertinent labs & imaging results that were available during my care of the patient were reviewed by me and considered in my medical decision making (see chart for details).    MDM Rules/Calculators/A&P                           45 year old lady presents to ER with concern for upper abdominal discomfort, nausea, vomiting in the setting of recent alcohol binge.  Patient's alcohol level is still elevated.  The remainder of her lab work is grossly stable.  Suspect symptoms related to alcoholic gastritis versus possibly early withdrawal symptoms.  Patient was provided fluids, Ativan, GI cocktail and further observed in ER.  Patient's symptoms greatly improved, she was able to tolerate p.o. without difficulty.  Given her current appearance, believe she is stable for outpatient management.  Discussed further treatment options, patient expressed interest in trial of Librium taper at home.  Reviewed very strict return precautions and ultimately discharged home.    After the discussed management above, the patient was determined to be safe for discharge.  The patient was in agreement with this plan and all questions regarding their care were answered.  ED return precautions were discussed and the patient will return to the ED with any significant worsening of condition.  Final Clinical Impression(s) / ED Diagnoses Final diagnoses:  Alcohol abuse    Rx / DC Orders ED Discharge  Orders          Ordered    ondansetron (ZOFRAN ODT) 4 MG disintegrating tablet  Every 8 hours PRN        01/26/21 2157    chlordiazePOXIDE  (LIBRIUM) 25 MG capsule        01/26/21 2157             Milagros Loll, MD 01/26/21 2211

## 2021-02-16 ENCOUNTER — Ambulatory Visit: Payer: 59 | Admitting: Emergency Medicine

## 2021-02-28 ENCOUNTER — Encounter: Payer: Self-pay | Admitting: Internal Medicine

## 2021-03-18 ENCOUNTER — Encounter: Payer: Self-pay | Admitting: Internal Medicine

## 2021-03-18 ENCOUNTER — Ambulatory Visit (INDEPENDENT_AMBULATORY_CARE_PROVIDER_SITE_OTHER): Payer: 59 | Admitting: Internal Medicine

## 2021-03-18 VITALS — BP 98/58 | Ht 65.0 in | Wt 176.0 lb

## 2021-03-18 DIAGNOSIS — R7989 Other specified abnormal findings of blood chemistry: Secondary | ICD-10-CM | POA: Diagnosis not present

## 2021-03-18 DIAGNOSIS — R1013 Epigastric pain: Secondary | ICD-10-CM | POA: Diagnosis not present

## 2021-03-18 DIAGNOSIS — F1010 Alcohol abuse, uncomplicated: Secondary | ICD-10-CM

## 2021-03-18 DIAGNOSIS — R1310 Dysphagia, unspecified: Secondary | ICD-10-CM

## 2021-03-18 DIAGNOSIS — K219 Gastro-esophageal reflux disease without esophagitis: Secondary | ICD-10-CM | POA: Diagnosis not present

## 2021-03-18 MED ORDER — OMEPRAZOLE 40 MG PO CPDR: 40.0000 mg | DELAYED_RELEASE_CAPSULE | Freq: Every day | ORAL | 3 refills | Status: DC

## 2021-03-18 MED ORDER — OMEPRAZOLE 40 MG PO CPDR: 40.0000 mg | DELAYED_RELEASE_CAPSULE | Freq: Two times a day (BID) | ORAL | 3 refills | Status: DC

## 2021-03-18 NOTE — Progress Notes (Signed)
Chief Complaint: Epigastric ab pain  HPI : 45 year old female with history of GERD presents with epigastric ab pain.  The pain has been present for the last year. She has issues when she eats really fatty or greasy foods. When she was drinking a lot of alcohol, she was told that she was having gastritis. She is not drinking alcohol anymore. Her last alcoholic beverage was 1 month ago. Prior to that, she was drinking about a pint of hard liquor every day. After stopping her alcohol use, she has had some improvement in her symptoms. She was given sucralfate to try by her cousin, which seemed to help. She is taking meloxicam as needed for pain in her wrist. Denies other NSAID use. She used to take [facility] powders but has stopped that. Denies melena, hematocheiza. She will occasionally have issues with coughing when she eats. Has some issues with trouble swallowing. Endorses chest burning. Denies regurgitation. She has issues with alternating constipatoin and diarrhea.  Denies nausea and vomiting   Past Medical History:  Diagnosis Date   Anemia    GERD (gastroesophageal reflux disease)    tums as needed   Hemoperitoneum 12/25/2010   SVD (spontaneous vaginal delivery)    x 1   Toothache 01/29/2013     Past Surgical History:  Procedure Laterality Date   BILATERAL SALPINGECTOMY N/A 06/19/2013   Procedure: BILATERAL SALPINGECTOMY;  Surgeon: Willodean Rosenthal, MD;  [facility]: [facility];  Service: Gynecology;  Laterality: N/A;   DILITATION & CURRETTAGE/HYSTROSCOPY WITH NOVASURE ABLATION N/A 04/04/2013   Procedure: DILATATION & CURETTAGE/HYSTEROSCOPY WITH Attempted NOVASURE ABLATION ;  Surgeon: Willodean Rosenthal, MD;  [facility];  Service: Gynecology;  Laterality: N/A;   HYSTEROSCOPY N/A 05/26/2013   Procedure: UNSUCCESSFUL HYSTEROSCOPY WITH HYDROTHERMAL ABLATION;  Surgeon: Willodean Rosenthal, MD;  [facility];  Service: Gynecology;  Laterality: N/A;   LAPAROSCOPY  12/25/2010    Procedure: LAPAROSCOPY OPERATIVE;  Surgeon: Roseanna Rainbow, MD;  [facility];  Service: Gynecology;  Laterality: N/A;  Operative laparoscopy /cauterization of right hemorrhagic cyst wall. Removal of belly rings.   LAPAROSCOPY FOR ECTOPIC PREGNANCY     NOVASURE ABLATION N/A 05/26/2013   Procedure: UNSUCCESSFUL NOVASURE ABLATION;  Surgeon: Willodean Rosenthal, MD;  [facility];  Service: Gynecology;  Laterality: N/A;   uterine biopsy     VAGINAL HYSTERECTOMY N/A 06/19/2013   Procedure:  TOTAL VAGINAL HYSTERECTOMY with bilateral salpingectomy;  Surgeon: Willodean Rosenthal, MD;  Location: [facility];  Service: Gynecology;  Laterality: N/A;   Family History  Problem Relation Age of Onset   Drug abuse Mother    Arthritis Maternal Aunt    Cancer Maternal Aunt    Drug abuse Maternal Aunt    Arthritis Maternal Uncle    Alcohol abuse Maternal Uncle    Cancer Maternal Uncle    Drug abuse Maternal Uncle    Cancer Maternal Grandmother    Alcohol abuse Maternal Grandfather    Cancer Maternal Grandfather    Social History   Tobacco Use   Smoking status: Never   Smokeless tobacco: Never  Vaping Use   Vaping Use: Never used  Substance Use Topics   Alcohol use: Yes    Comment: 1 + pint of liquor per day   Drug use: Yes    Types: Benzodiazepines   Current Outpatient Medications  Medication Sig Dispense Refill   b complex vitamins capsule Take 1 capsule by mouth daily.     meloxicam (MOBIC) 7.5 MG tablet Take  1 tablet (7.5 mg total) by mouth daily. 30 tablet 0   Multiple Vitamin (MULTIVITAMINS PO) Take 1 tablet by mouth daily.     No current facility-administered medications for this visit.   Allergies  Allergen Reactions   Fluconazole Hives   Hydrocodone Nausea And Vomiting     Review of Systems: All systems reviewed and negative except where noted in HPI.   Physical Exam: BP (!) 98/58   Ht 5\' 5"  (1.651 m)   Wt 176 lb (79.8 kg)   LMP 06/19/2013   BMI 29.29 kg/m   Constitutional: Pleasant,well-developed, female in no acute distress. HEENT: Normocephalic and atraumatic. Conjunctivae are normal. No scleral icterus. Cardiovascular: Normal rate, regular rhythm.  Pulmonary/chest: Effort normal and breath sounds normal. No wheezing, rales or rhonchi. Abdominal: Soft, nondistended, tender in the epigastric area. Bowel sounds active throughout. There are no masses palpable. No hepatomegaly. Extremities: No edema Neurological: Alert and oriented to person place and time. Skin: Skin is warm and dry. No rashes noted. Psychiatric: Normal mood and affect. Behavior is normal.  Labs 01/2021: Mildly elevated AST of 53, ALT nml at 32, T bili elevated at 1.9. Normal lipase. CBC normal.  CT A/P w/contrast 06/17/19: IMPRESSION: 1. No acute intra-abdominal or pelvic pathology. No bowel obstruction. Normal appendix. 2. Fatty liver. 3. A 12 mm rounded lymph node to the left of the abdominal aorta of indeterminate etiology or clinical significance. Clinical correlation and attention on follow-up recommended.  Abd U/S 04/06/20: IMPRESSION: 1. Diffusely increased echogenicity within the hepatic parenchyma, nonspecific, but can be seen in setting of steatosis or other intrinsic hepatocellular disease. Correlation with LFTs suggested. 2. Normal sonographic appearance of the gallbladder. No cholelithiasis or evidence for acute cholecystitis. No biliary dilatation. 3. 8 mm simple cyst within the right hepatic lobe. 4. Otherwise unremarkable abdominal ultrasound.  ASSESSMENT AND PLAN:  Epigastric ab pain GERD Dysphagia Elevated LFTs  History of alcohol use Patient presents with epigastric abdominal pain that has responded well to sucralfate therapy.  This abdominal pain has been going on for the last year and appears to have improved with discontinuation of significant alcohol use.  Her pain may have be due to PUD, gastritis/duodenitis, esophagitis, or GERD.  Her  NSAID use would put her at increased risk for PUD.  Will go ahead and start her on some PPI therapy to see if this helps with her symptoms.  Will plan for EGD to further evaluate for an etiology of her epigastric abdominal pain and see if there were any obvious causes for her dysphagia.  Suspect that her elevated LFTs are due to alcoholic liver disease from her prior alcohol use. Her prior abdominal imaging has shown fatty liver, which would be consistent with this. - Avoid reflux-inducing foods - Start omeprazole 40 mg BID, take 30 minutes before meals - Plan for EGD [facility] - Avoid NSAIDs such as meloxicam if possible - If the above work up is unremarkable, then could consider work up for chronic pancreatitis in the setting of prior alcohol use - Consider work-up for viral hepatitis, and obtaining elastography in the future - Will need to discuss colon cancer screening once her epigastric abdominal pain has improved  04/08/20, MD

## 2021-03-18 NOTE — Patient Instructions (Signed)
If you are age 45 or older, your body mass index should be between 23-30. Your Body mass index is 29.29 kg/m. If this is out of the aforementioned range listed, please consider follow up with your Primary Care Provider.  If you are age 65 or younger, your body mass index should be between 19-25. Your Body mass index is 29.29 kg/m. If this is out of the aformentioned range listed, please consider follow up with your Primary Care Provider.   ________________________________________________________  The [place] [facility] providers would like to encourage you to use [facility] to communicate with providers for non-urgent requests or questions.  Due to long hold times on the telephone, sending your provider a message by [facility] may be a faster and more efficient way to get a response.  Please allow 48 business hours for a response.  Please remember that this is for non-urgent requests.  _______________________________________________________  We have sent the following medications to your pharmacy for you to pick up at your convenience:  Omeprazole.  Avoid NSAIDS

## 2021-04-13 ENCOUNTER — Encounter: Payer: Self-pay | Admitting: Internal Medicine

## 2021-04-13 ENCOUNTER — Ambulatory Visit (AMBULATORY_SURGERY_CENTER): Payer: 59 | Admitting: Internal Medicine

## 2021-04-13 VITALS — BP 111/68 | HR 76 | Temp 97.1°F | Resp 15 | Ht 65.0 in | Wt 176.0 lb

## 2021-04-13 DIAGNOSIS — R1013 Epigastric pain: Secondary | ICD-10-CM | POA: Diagnosis present

## 2021-04-13 DIAGNOSIS — R1310 Dysphagia, unspecified: Secondary | ICD-10-CM | POA: Diagnosis not present

## 2021-04-13 DIAGNOSIS — R12 Heartburn: Secondary | ICD-10-CM | POA: Diagnosis not present

## 2021-04-13 DIAGNOSIS — K2970 Gastritis, unspecified, without bleeding: Secondary | ICD-10-CM | POA: Diagnosis not present

## 2021-04-13 DIAGNOSIS — K2950 Unspecified chronic gastritis without bleeding: Secondary | ICD-10-CM | POA: Diagnosis not present

## 2021-04-13 DIAGNOSIS — K2980 Duodenitis without bleeding: Secondary | ICD-10-CM | POA: Diagnosis not present

## 2021-04-13 DIAGNOSIS — K219 Gastro-esophageal reflux disease without esophagitis: Secondary | ICD-10-CM

## 2021-04-13 MED ORDER — SODIUM CHLORIDE 0.9 % IV SOLN: 500.0000 mL | Freq: Once | INTRAVENOUS | Status: DC

## 2021-04-13 NOTE — Patient Instructions (Signed)
HANDOUT ON GASTRITIS GIVEN TO YOU TODAY  AWAIT BIOPSY RESULTS OF ESOPHAGUS,STOMACH,AND DUODENUM  RETURN TO OFFICE TO SEE DR DORSEY IN ONE MONTH -MAKE THIS APPOINTMENT IF NOT MADE   YOU HAD AN ENDOSCOPIC PROCEDURE TODAY AT THE [facility]:   Refer to the procedure report that was given to you for any specific questions about what was found during the examination.  If the procedure report does not answer your questions, please call your gastroenterologist to clarify.  If you requested that your care partner not be given the details of your procedure findings, then the procedure report has been included in a sealed envelope for you to review at your convenience later.  YOU SHOULD EXPECT: Some feelings of bloating in the abdomen. Passage of more gas than usual.  Walking can help get rid of the air that was put into your GI tract during the procedure and reduce the bloating. If you had a lower endoscopy (such as a colonoscopy or flexible sigmoidoscopy) you may notice spotting of blood in your stool or on the toilet paper. If you underwent a bowel prep for your procedure, you may not have a normal bowel movement for a few days.  Please Note:  You might notice some irritation and congestion in your nose or some drainage.  This is from the oxygen used during your procedure.  There is no need for concern and it should clear up in a day or so.  SYMPTOMS TO REPORT IMMEDIATELY:   Following upper endoscopy (EGD)  Vomiting of blood or coffee ground material  New chest pain or pain under the shoulder blades  Painful or persistently difficult swallowing  New shortness of breath  Fever of 100F or higher  Black, tarry-looking stools  For urgent or emergent issues, a gastroenterologist can be reached at any hour by calling [phone]. Do not use MyChart messaging for urgent concerns.    DIET:  We do recommend a small meal at first, but then you may proceed to your regular diet.   Drink plenty of fluids but you should avoid alcoholic beverages for 24 hours.  ACTIVITY:  You should plan to take it easy for the rest of today and you should NOT DRIVE or use heavy machinery until tomorrow (because of the sedation medicines used during the test).    FOLLOW UP: Our staff will call the number listed on your records 48-72 hours following your procedure to check on you and address any questions or concerns that you may have regarding the information given to you following your procedure. If we do not reach you, we will leave a message.  We will attempt to reach you two times.  During this call, we will ask if you have developed any symptoms of COVID 19. If you develop any symptoms (ie: fever, flu-like symptoms, shortness of breath, cough etc.) before then, please call [phone].  If you test positive for Covid 19 in the 2 weeks post procedure, please call and report this information to [place].    If any biopsies were taken you will be contacted by phone or by letter within the next 1-3 weeks.  Please call us at [phone] if you have not heard about the biopsies in 3 weeks.    SIGNATURES/CONFIDENTIALITY: You and/or your care partner have signed paperwork which will be entered into your electronic medical record.  These signatures attest to the fact that that the information above on your After Visit Summary has  been reviewed and is understood.  Full responsibility of the confidentiality of this discharge information lies with you and/or your care-partner.

## 2021-04-13 NOTE — Progress Notes (Signed)
Called to room to assist during endoscopic procedure.  Patient ID and intended procedure confirmed with present staff. Received instructions for my participation in the procedure from the performing physician.  

## 2021-04-13 NOTE — Op Note (Signed)
[facility] Patient Name: Danielle Reilly Procedure Date: 04/13/2021 3:43 PM MRN: [id number] Endoscopist: Nicole Kindred "Danielle Reilly ,  Age: 45 Referring MD:  Date of Birth: [birth year] Gender: Female Account #: [id number] Procedure:                Upper GI endoscopy Indications:              Epigastric abdominal pain, Dysphagia, Heartburn Medicines:                Monitored Anesthesia Care Procedure:                Pre-Anesthesia Assessment:                           - Prior to the procedure, a History and Physical                            was performed, and patient medications and                            allergies were reviewed. The patient's tolerance of                            previous anesthesia was also reviewed. The risks                            and benefits of the procedure and the sedation                            options and risks were discussed with the patient.                            All questions were answered, and informed consent                            was obtained. Prior Anticoagulants: The patient has                            taken no previous anticoagulant or antiplatelet                            agents. ASA Grade Assessment: II - A patient with                            mild systemic disease. After reviewing the risks                            and benefits, the patient was deemed in                            satisfactory condition to undergo the procedure.                           After obtaining informed consent, the endoscope was  passed under direct vision. Throughout the                            procedure, the patient's blood pressure, pulse, and                            oxygen saturations were monitored continuously. The                            GIF [id number] #[id number] was introduced through the                            mouth, and advanced to the second part of duodenum.                            The  upper GI endoscopy was accomplished without                            difficulty. The patient tolerated the procedure                            well. Scope In: Scope Out: Findings:                 White nummular lesions were noted in the distal                            esophagus. Biopsies were taken with a cold forceps                            for histology.                           Localized erythematous mucosa without bleeding was                            found in the gastric antrum. Biopsies were taken                            with a cold forceps for histology.                           The examined duodenum was normal. Biopsies were                            taken with a cold forceps for histology. Complications:            No immediate complications. Estimated Blood Loss:     Estimated blood loss was minimal. Impression:               - White nummular lesions in esophageal mucosa.                            Biopsied.                           -  Erythematous mucosa in the antrum. Biopsied.                           - Normal examined duodenum. Biopsied. Recommendation:           - Discharge patient to home (with escort).                           - Await pathology results.                           - Return to GI clinic in 1 month.                           - The findings and recommendations were discussed                            with the patient. Nicole Kindred "Danielle Reilly,  04/13/2021 4:07:45 PM

## 2021-04-13 NOTE — Progress Notes (Signed)
VS AJ ° °Pt's states no medical or surgical changes since previsit or office visit. ° °

## 2021-04-13 NOTE — Progress Notes (Signed)
GASTROENTEROLOGY PROCEDURE H&P NOTE   Primary Care Physician: Georgina Quint, MD    Reason for Procedure:   Epigastric ab pain, dysphagia  Plan:    EGD  Patient is appropriate for endoscopic procedure(s) in the ambulatory ([facility]) setting.  The nature of the procedure, as well as the risks, benefits, and alternatives were carefully and thoroughly reviewed with the patient. Ample time for discussion and questions allowed. The patient understood, was satisfied, and agreed to proceed.     HPI: Danielle Reilly is a 45 y.o. female who presents for EGD for evaluation of epigastric ab pain and dysphagia.  Patient was most recently seen in the Gastroenterology Clinic on 03/18/21.  No interval change in medical history since that appointment. Please refer to that note for full details regarding GI history and clinical presentation.   Past Medical History:  Diagnosis Date   Anemia    GERD (gastroesophageal reflux disease)    tums as needed   Hemoperitoneum 12/25/2010   SVD (spontaneous vaginal delivery)    x 1   Toothache 01/29/2013    Past Surgical History:  Procedure Laterality Date   BILATERAL SALPINGECTOMY N/A 06/19/2013   Procedure: BILATERAL SALPINGECTOMY;  Surgeon: Willodean Rosenthal, MD;  Location: [facility];  Service: Gynecology;  Laterality: N/A;   DILITATION & CURRETTAGE/HYSTROSCOPY WITH NOVASURE ABLATION N/A 04/04/2013   Procedure: DILATATION & CURETTAGE/HYSTEROSCOPY WITH Attempted NOVASURE ABLATION ;  Surgeon: Willodean Rosenthal, MD;  Location: [facility];  Service: Gynecology;  Laterality: N/A;   HYSTEROSCOPY N/A 05/26/2013   Procedure: UNSUCCESSFUL HYSTEROSCOPY WITH HYDROTHERMAL ABLATION;  Surgeon: Willodean Rosenthal, MD;  Location: [facility];  Service: Gynecology;  Laterality: N/A;   LAPAROSCOPY  12/25/2010   Procedure: LAPAROSCOPY OPERATIVE;  Surgeon: Roseanna Rainbow, MD;  Location: [facility];  Service: Gynecology;  Laterality: N/A;  Operative laparoscopy /cauterization  of right hemorrhagic cyst wall. Removal of belly rings.   LAPAROSCOPY FOR ECTOPIC PREGNANCY     NOVASURE ABLATION N/A 05/26/2013   Procedure: UNSUCCESSFUL NOVASURE ABLATION;  Surgeon: Willodean Rosenthal, MD;  Location: [facility];  Service: Gynecology;  Laterality: N/A;   uterine biopsy     VAGINAL HYSTERECTOMY N/A 06/19/2013   Procedure:  TOTAL VAGINAL HYSTERECTOMY with bilateral salpingectomy;  Surgeon: Willodean Rosenthal, MD;  Location: [facility];  Service: Gynecology;  Laterality: N/A;    Prior to Admission medications   Medication Sig Start Date End Date Taking? Authorizing Provider  b complex vitamins capsule Take 1 capsule by mouth daily.    [provider]  meloxicam (MOBIC) 7.5 MG tablet Take 1 tablet (7.5 mg total) by mouth daily. 10/22/20   Ivette Loyal, NP  Multiple Vitamin (MULTIVITAMINS PO) Take 1 tablet by mouth daily.    [provider]  omeprazole (PRILOSEC) 40 MG capsule Take 1 capsule (40 mg total) by mouth in the morning and at bedtime. 03/18/21   Imogene Burn, MD  famotidine (PEPCID) 20 MG tablet Take 1 tablet (20 mg total) by mouth daily. Patient not taking: Reported on 10/28/2019 05/19/19 04/06/20  Jacalyn Lefevre, MD    Current Outpatient Medications  Medication Sig Dispense Refill   b complex vitamins capsule Take 1 capsule by mouth daily.     meloxicam (MOBIC) 7.5 MG tablet Take 1 tablet (7.5 mg total) by mouth daily. 30 tablet 0   Multiple Vitamin (MULTIVITAMINS PO) Take 1 tablet by mouth daily.     omeprazole (PRILOSEC) 40 MG capsule Take 1 capsule (40 mg total) by mouth in the  morning and at bedtime. 180 capsule 3   Current Facility-Administered Medications  Medication Dose Route Frequency Provider Last Rate Last Admin   0.9 %  sodium chloride infusion  500 mL Intravenous Once Imogene Burn, MD        Allergies as of 04/13/2021 - Review Complete 04/13/2021  Allergen Reaction Noted   Fluconazole Hives 03/18/2021   Hydrocodone Nausea And  Vomiting 10/22/2020    Family History  Problem Relation Age of Onset   Drug abuse Mother    Arthritis Maternal Aunt    Cancer Maternal Aunt    Drug abuse Maternal Aunt    Arthritis Maternal Uncle    Alcohol abuse Maternal Uncle    Cancer Maternal Uncle    Drug abuse Maternal Uncle    Cancer Maternal Grandmother    Alcohol abuse Maternal Grandfather    Cancer Maternal Grandfather     Social History   Socioeconomic History   Marital status: Divorced    Spouse name: Not on file   Number of children: Not on file   Years of education: Not on file   Highest education level: Not on file  Occupational History   Not on file  Tobacco Use   Smoking status: Never   Smokeless tobacco: Never  Vaping Use   Vaping Use: Never used  Substance and Sexual Activity   Alcohol use: Yes    Comment: 1 + pint of liquor per day   Drug use: Yes    Types: Benzodiazepines   Sexual activity: Yes    Birth control/protection: Surgical  Other Topics Concern   Not on file  Social History Narrative   Pt lives in [place] with her son.  She is unemployed.  She is not followed by a psychiatrist.   Social Determinants of Health   Financial Resource Strain: Not on file  Food Insecurity: Not on file  Transportation Needs: Not on file  Physical Activity: Not on file  Stress: Not on file  Social Connections: Not on file  Intimate Partner Violence: Not on file    Physical Exam: Vital signs in last 24 hours: BP 106/63    Pulse 76    Temp (!) 97.1 F (36.2 C)    Ht 5\' 5"  (1.651 m)    Wt 176 lb (79.8 kg)    LMP 06/19/2013    SpO2 99%    BMI 29.29 kg/m  GEN: NAD EYE: Sclerae anicteric ENT: MMM CV: Non-tachycardic Pulm: No increased WOB GI: Soft NEURO:  Alert & Oriented   08/19/2013, MD [facility]   04/13/2021 3:02 PM

## 2021-04-13 NOTE — Progress Notes (Signed)
A and O x3. Report to RN. Tolerated MAC anesthesia well.Teeth unchanged after procedure. 

## 2021-04-15 ENCOUNTER — Telehealth: Payer: Self-pay | Admitting: *Deleted

## 2021-04-15 NOTE — Telephone Encounter (Signed)
°  Follow up Call-  Call back number 04/13/2021  Post procedure Call Back phone  # [phone]  Permission to leave phone message Yes  Some recent data might be hidden     Patient questions:  Do you have a fever, pain , or abdominal swelling? No. Pain Score  0 *  Have you tolerated food without any problems? Yes.    Have you been able to return to your normal activities? Yes.    Do you have any questions about your discharge instructions: Diet   No. Medications  No. Follow up visit  No.  Do you have questions or concerns about your Care? No.  Actions: * If pain score is 4 or above: No action needed, pain <4.

## 2021-04-21 ENCOUNTER — Telehealth: Payer: Self-pay | Admitting: Internal Medicine

## 2021-04-21 NOTE — Telephone Encounter (Signed)
Patient called stated that she really did not understand the results from her Endo Procedure done on 12/28. Seeking advice, Please advise.

## 2021-04-21 NOTE — Telephone Encounter (Signed)
Addressed via MyChart message

## 2021-04-24 ENCOUNTER — Encounter: Payer: Self-pay | Admitting: Internal Medicine

## 2021-05-24 ENCOUNTER — Ambulatory Visit: Payer: Self-pay | Admitting: Internal Medicine

## 2021-06-14 ENCOUNTER — Ambulatory Visit: Payer: 59 | Admitting: Dermatology

## 2021-06-21 ENCOUNTER — Ambulatory Visit: Payer: Self-pay | Admitting: Internal Medicine

## 2021-06-24 ENCOUNTER — Other Ambulatory Visit: Payer: Self-pay

## 2021-06-24 ENCOUNTER — Telehealth: Payer: Self-pay | Admitting: Internal Medicine

## 2021-06-24 NOTE — Telephone Encounter (Signed)
Agree, based on description this is likely benign anorectal bleeding 2/2 constipation/straining. Plan to treat as outlined. Can hold off on Anusol unless sxs persist. If worsening sxs or other concerns, to call back or go to ER if after hours/weekend.  ?

## 2021-06-24 NOTE — Telephone Encounter (Signed)
Patient called to report that when she had a BM this morning the toilet was full of blood.  She needs to know what she needs to do.  Please call and advise. ? ?Thank you. ?

## 2021-06-24 NOTE — Telephone Encounter (Signed)
Called pt to inquire further about her symptoms. States she has been having abd bloating with abd pain for 2 days. Has felt the need to strain to defecate but has had loose stools when trying to defecate. States this has also been occurring for the past 2 days. It was not until today that she noticed blood in the toilet with her loose stool. Denies rectal pain, rectal itching or concerns with hemorrhoids. States she feels "very full" and cannot remove the stool. Advised it is likely she has "overflow" diarrhea as result of her constipation. Advised the following: ? ?To remove the excess stool from her colon that is resulting in overflow diarrhea, will need to perform bowel purge (sent via My Chart per pt request) ?Drink 64 ounces or more of fluid per day ?Abstain from alcohol use and use of NSAIDs ?Post bowel purge, may take 1 dose of Miralax daily along with 1 dose of OTC stool softener ?May take half dose of fiber daily and gradually increase to full dose to reduce symptoms of bloating and constipation ?If having any rectal discomfort, may try sitz bath ?It is likely she has irritated internal hemorrhoids d/t straining and constipation. Dr. Leonides Schanz is out of the office. Will discuss with DOD, Dr. Barron Alvine to determine if he would like pt to use OTC anusol cream applied to tip of Prep H supp prn ?

## 2021-06-24 NOTE — Telephone Encounter (Signed)
Sent pt My Chart message with Dr. Frankey Shown response. ?

## 2021-07-20 ENCOUNTER — Ambulatory Visit: Payer: Medicaid Other | Admitting: Internal Medicine

## 2021-08-27 ENCOUNTER — Emergency Department (HOSPITAL_COMMUNITY): Payer: 59

## 2021-08-27 ENCOUNTER — Other Ambulatory Visit: Payer: Self-pay

## 2021-08-27 ENCOUNTER — Encounter (HOSPITAL_COMMUNITY): Payer: Self-pay

## 2021-08-27 ENCOUNTER — Emergency Department (HOSPITAL_COMMUNITY)
Admission: EM | Admit: 2021-08-27 | Discharge: 2021-08-27 | Disposition: A | Discharge status: Home / Self Care | Payer: 59 | Attending: Emergency Medicine | Admitting: Emergency Medicine

## 2021-08-27 DIAGNOSIS — R000 Tachycardia, unspecified: Secondary | ICD-10-CM | POA: Diagnosis not present

## 2021-08-27 DIAGNOSIS — K2920 Alcoholic gastritis without bleeding: Secondary | ICD-10-CM | POA: Diagnosis not present

## 2021-08-27 DIAGNOSIS — F10130 Alcohol abuse with withdrawal, uncomplicated: Secondary | ICD-10-CM | POA: Diagnosis not present

## 2021-08-27 DIAGNOSIS — Y909 Presence of alcohol in blood, level not specified: Secondary | ICD-10-CM | POA: Diagnosis not present

## 2021-08-27 DIAGNOSIS — R251 Tremor, unspecified: Secondary | ICD-10-CM | POA: Diagnosis not present

## 2021-08-27 DIAGNOSIS — R1013 Epigastric pain: Secondary | ICD-10-CM | POA: Diagnosis present

## 2021-08-27 DIAGNOSIS — F10930 Alcohol use, unspecified with withdrawal, uncomplicated: Secondary | ICD-10-CM

## 2021-08-27 DIAGNOSIS — R112 Nausea with vomiting, unspecified: Secondary | ICD-10-CM

## 2021-08-27 LAB — CK: Total CK: 152 U/L (ref 38–234)

## 2021-08-27 LAB — URINALYSIS, ROUTINE W REFLEX MICROSCOPIC
Bilirubin Urine: NEGATIVE
Glucose, UA: NEGATIVE mg/dL
Ketones, ur: 80 mg/dL — AB
Leukocytes,Ua: NEGATIVE
Nitrite: NEGATIVE
Protein, ur: 100 mg/dL — AB
Specific Gravity, Urine: 1.018 (ref 1.005–1.030)
pH: 5 (ref 5.0–8.0)

## 2021-08-27 LAB — CBC WITH DIFFERENTIAL/PLATELET
Abs Immature Granulocytes: 0.02 10*3/uL (ref 0.00–0.07)
Basophils Absolute: 0 10*3/uL (ref 0.0–0.1)
Basophils Relative: 1 %
Eosinophils Absolute: 0.1 10*3/uL (ref 0.0–0.5)
Eosinophils Relative: 3 %
HCT: 37.3 % (ref 36.0–46.0)
Hemoglobin: 12.4 g/dL (ref 12.0–15.0)
Immature Granulocytes: 0 %
Lymphocytes Relative: 18 %
Lymphs Abs: 1 10*3/uL (ref 0.7–4.0)
MCH: 29 pg (ref 26.0–34.0)
MCHC: 33.2 g/dL (ref 30.0–36.0)
MCV: 87.4 fL (ref 80.0–100.0)
Monocytes Absolute: 0.5 10*3/uL (ref 0.1–1.0)
Monocytes Relative: 8 %
Neutro Abs: 4 10*3/uL (ref 1.7–7.7)
Neutrophils Relative %: 70 %
Platelets: 174 10*3/uL (ref 150–400)
RBC: 4.27 MIL/uL (ref 3.87–5.11)
RDW: 14 % (ref 11.5–15.5)
WBC: 5.7 10*3/uL (ref 4.0–10.5)
nRBC: 0 % (ref 0.0–0.2)

## 2021-08-27 LAB — COMPREHENSIVE METABOLIC PANEL
ALT: 62 U/L — ABNORMAL HIGH (ref 0–44)
AST: 108 U/L — ABNORMAL HIGH (ref 15–41)
Albumin: 5 g/dL (ref 3.5–5.0)
Alkaline Phosphatase: 61 U/L (ref 38–126)
Anion gap: 17 — ABNORMAL HIGH (ref 5–15)
BUN: <5 mg/dL — ABNORMAL LOW (ref 6–20)
CO2: 19 mmol/L — ABNORMAL LOW (ref 22–32)
Calcium: 9.8 mg/dL (ref 8.9–10.3)
Chloride: 96 mmol/L — ABNORMAL LOW (ref 98–111)
Creatinine, Ser: 0.92 mg/dL (ref 0.44–1.00)
GFR, Estimated: >60 mL/min (ref >60)
Glucose, Bld: 112 mg/dL — ABNORMAL HIGH (ref 70–99)
Potassium: 4 mmol/L (ref 3.5–5.1)
Sodium: 132 mmol/L — ABNORMAL LOW (ref 135–145)
Total Bilirubin: 2 mg/dL — ABNORMAL HIGH (ref 0.3–1.2)
Total Protein: 8.2 g/dL — ABNORMAL HIGH (ref 6.5–8.1)

## 2021-08-27 LAB — LIPASE, BLOOD: Lipase: 32 U/L (ref 11–51)

## 2021-08-27 LAB — I-STAT BETA HCG BLOOD, ED (MC, WL, AP ONLY): I-stat hCG, quantitative: <5 m[IU]/mL (ref <5)

## 2021-08-27 LAB — TROPONIN I (HIGH SENSITIVITY): Troponin I (High Sensitivity): 3 ng/L (ref <18)

## 2021-08-27 MED ORDER — CHLORDIAZEPOXIDE HCL 25 MG PO CAPS
50.0000 mg | ORAL_CAPSULE | Freq: Once | ORAL | Status: AC
  Administered 2021-08-27: 50 mg via ORAL
  Filled 2021-08-27: qty 2

## 2021-08-27 MED ORDER — ONDANSETRON HCL 4 MG/2ML IJ SOLN
4.0000 mg | Freq: Once | INTRAMUSCULAR | Status: AC
  Administered 2021-08-27: 4 mg via INTRAVENOUS
  Filled 2021-08-27: qty 2

## 2021-08-27 MED ORDER — LORAZEPAM 2 MG/ML IJ SOLN
1.0000 mg | Freq: Once | INTRAMUSCULAR | Status: AC
  Administered 2021-08-27: 1 mg via INTRAMUSCULAR
  Filled 2021-08-27: qty 1

## 2021-08-27 MED ORDER — ALUM & MAG HYDROXIDE-SIMETH 200-200-20 MG/5ML PO SUSP
30.0000 mL | Freq: Once | ORAL | Status: AC
  Administered 2021-08-27: 30 mL via ORAL
  Filled 2021-08-27: qty 30

## 2021-08-27 MED ORDER — ONDANSETRON 4 MG PO TBDP
4.0000 mg | ORAL_TABLET | Freq: Three times a day (TID) | ORAL | 0 refills | Status: DC | PRN
Start: 2021-08-27 — End: 2021-12-24

## 2021-08-27 MED ORDER — HYDROMORPHONE HCL 1 MG/ML IJ SOLN
1.0000 mg | Freq: Once | INTRAMUSCULAR | Status: AC
  Administered 2021-08-27: 1 mg via INTRAVENOUS
  Filled 2021-08-27: qty 1

## 2021-08-27 MED ORDER — IOHEXOL 300 MG/ML  SOLN
100.0000 mL | Freq: Once | INTRAMUSCULAR | Status: AC | PRN
  Administered 2021-08-27: 100 mL via INTRAVENOUS

## 2021-08-27 MED ORDER — LIDOCAINE VISCOUS HCL 2 % MT SOLN
15.0000 mL | Freq: Once | OROMUCOSAL | Status: AC
  Administered 2021-08-27: 15 mL via ORAL
  Filled 2021-08-27: qty 15

## 2021-08-27 MED ORDER — CHLORDIAZEPOXIDE HCL 25 MG PO CAPS: ORAL_CAPSULE | ORAL | 0 refills | Status: DC

## 2021-08-27 MED ORDER — OMEPRAZOLE 40 MG PO CPDR: 40.0000 mg | DELAYED_RELEASE_CAPSULE | Freq: Two times a day (BID) | ORAL | 3 refills | Status: DC

## 2021-08-27 MED ORDER — LACTATED RINGERS IV BOLUS
2000.0000 mL | Freq: Once | INTRAVENOUS | Status: AC
  Administered 2021-08-27: 2000 mL via INTRAVENOUS

## 2021-08-27 MED ORDER — SUCRALFATE 1 GM/10ML PO SUSP: 1.0000 g | Freq: Three times a day (TID) | ORAL | 0 refills | Status: DC

## 2021-08-27 MED ORDER — FAMOTIDINE IN NACL 20-0.9 MG/50ML-% IV SOLN
20.0000 mg | Freq: Once | INTRAVENOUS | Status: AC
  Administered 2021-08-27: 20 mg via INTRAVENOUS
  Filled 2021-08-27: qty 50

## 2021-08-27 MED ORDER — METOCLOPRAMIDE HCL 5 MG/ML IJ SOLN
10.0000 mg | Freq: Once | INTRAMUSCULAR | Status: AC
Start: 2021-08-27 — End: 2021-08-27
  Administered 2021-08-27: 10 mg via INTRAVENOUS
  Filled 2021-08-27: qty 2

## 2021-08-27 NOTE — ED Triage Notes (Signed)
Pt arrived POV from home c/o epigastric pain x1 week. Pt states when she eats and drinks it burns and it feels like there is pressure in her chest.  ?

## 2021-08-27 NOTE — Discharge Instructions (Addendum)
Resume taking omeprazole.  This will give it stomach a chance to heal.  Additionally, a prescription for Carafate was sent to your pharmacy.  This will also soothe the stomach.  A separate prescription for Librium was sent to your pharmacy.  This is a medication to avoid severe alcohol withdrawals.  It is dosed on a taper.  Take as prescribed starting tomorrow.  If you experience nausea, there is a prescription for Zofran sent to your pharmacy as well.  Take this medication as needed.  Attached in this paperwork you will find resources for outpatient therapy, counseling, and help with cessation of alcohol.  Call the numbers listed to set up grief counseling and help with your alcohol cessation.  Return to the emergency department at any time if you experience any new or worsening severity of symptoms. ?

## 2021-08-27 NOTE — ED Provider Triage Note (Signed)
Emergency Medicine Provider Triage Evaluation Note ? ?Danielle Reilly , a 46 y.o. female  was evaluated in triage.  Pt complains of epigastric abdominal pain. She states that same has been ongoing for the past week. Patient states that she has been binge drinking for the past week as well and feels as though she is withdrawing. She states that she has not been eating or drinking much other than alcohol because oral intake makes her pain worse. She also endorses nausea and vomiting. Hx of GERD, states that this feels similar to reflux. ? ?Review of Systems  ?Positive:  ?Negative: See above ? ?Physical Exam  ?BP 120/71   Pulse (!) 114   Temp 98.6 ?F (37 ?C) (Oral)   Resp 18   Ht 5\' 6"  (1.676 m)   Wt 72.6 kg   LMP 06/19/2013   SpO2 96%   BMI 25.82 kg/m?  ?Gen:   Awake, no distress   ?Resp:  Normal effort  ?MSK:   Moves extremities without difficulty  ?Other:  Patient is tremulous  ? ?Medical Decision Making  ?Medically screening exam initiated at 1:58 PM.  Appropriate orders placed.  Danielle Reilly was informed that the remainder of the evaluation will be completed by another provider, this initial triage assessment does not replace that evaluation, and the importance of remaining in the ED until their evaluation is complete. ? ?Ativan given for withdrawal ?  ?08/19/2013, PA-C ?08/27/21 1414 ? ?

## 2021-08-27 NOTE — ED Provider Notes (Signed)
?[facility] ?Provider Note ? ? ?CSN: [id number] ?Arrival date & time: 08/27/21  1202 ? ?  ? ?History ? ?Chief Complaint  ?Patient presents with  ? Abdominal Pain  ? ? ?Danielle BasemanChaka Reilly is a 46 y.o. female. ? ? ?Abdominal Pain ?Pain location:  Epigastric ?Pain quality: sharp   ?Pain severity:  Moderate ?Onset quality:  Gradual ?Duration:  6 days ?Timing:  Constant ?Progression:  Worsening ?Chronicity:  New ?Context: alcohol use   ?Worsened by:  Eating and movement ?Associated symptoms: anorexia, nausea and vomiting   ?Risk factors: alcohol abuse   ?Patient presents for abdominal pain.  Pain is located in her epigastrium.  It has been ongoing for the past week.  She has had increased alcohol intake over this time.  She has had nausea and vomiting.  Medical history includes gastritis, GERD, anxiety, alcohol abuse. ? ?She reports that she was able to maintain sobriety for several months.  1 week ago, she resumed alcohol intake.  She has been dealing with the psychosocial stressors of losing her parents.  The upcoming Mother's Day holiday caused her to relapse.  She has been drinking each day over the past week.  Shortly after she resumed alcohol intake, she experienced a recent symptoms.  Last alcoholic drink was yesterday.   ?  ? ?Home Medications ?Prior to Admission medications   ?Medication Sig Start Date End Date Taking? Authorizing Provider  ?b complex vitamins capsule Take 1 capsule by mouth daily.   Yes [provider]  ?chlordiazePOXIDE (LIBRIUM) 25 MG capsule 50mg  PO TID x 1D, then 25-50mg  PO BID X 1D, then 25-50mg  PO QD X 1D 08/27/21  Yes Gloris Manchesterixon, Eden Rho, MD  ?fluticasone (FLONASE) 50 MCG/ACT nasal spray Place 1 spray into both nostrils daily as needed for allergies or rhinitis.   Yes [provider]  ?Multiple Vitamin (MULTIVITAMINS PO) Take 1 tablet by mouth daily.   Yes [provider]  ?ondansetron (ZOFRAN-ODT) 4 MG disintegrating tablet Take 1 tablet  (4 mg total) by mouth every 8 (eight) hours as needed for nausea or vomiting. 08/27/21  Yes Gloris Manchesterixon, Colm Lyford, MD  ?sucralfate (CARAFATE) 1 GM/10ML suspension Take 10 mLs (1 g total) by mouth 4 (four) times daily -  with meals and at bedtime. 08/27/21  Yes Gloris Manchesterixon, Star Resler, MD  ?omeprazole (PRILOSEC) 40 MG capsule Take 1 capsule (40 mg total) by mouth in the morning and at bedtime. ?Patient not taking: Reported on 08/27/2021 08/27/21   Gloris Manchesterixon, Amar Sippel, MD  ?famotidine (PEPCID) 20 MG tablet Take 1 tablet (20 mg total) by mouth daily. ?Patient not taking: Reported on 10/28/2019 05/19/19 04/06/20  Jacalyn LefevreHaviland, Julie, MD  ?   ? ?Allergies    ?Fluconazole and Hydrocodone   ? ?Review of Systems   ?Review of Systems  ?Gastrointestinal:  Positive for abdominal pain, anorexia, nausea and vomiting.  ?Psychiatric/Behavioral:  The patient is nervous/anxious.   ?All other systems reviewed and are negative. ? ?Physical Exam ?Updated Vital Signs ?BP 114/70   Pulse 96   Temp 98.6 ?F (37 ?C) (Oral)   Resp 18   Ht 5\' 6"  (1.676 m)   Wt 72.6 kg   LMP 06/19/2013   SpO2 98%   BMI 25.82 kg/m?  ?Physical Exam ?Vitals and nursing note reviewed.  ?Constitutional:   ?   General: She is not in acute distress. ?   Appearance: She is well-developed and normal weight. She is not ill-appearing, toxic-appearing or diaphoretic.  ?HENT:  ?  Head: Normocephalic and atraumatic.  ?   Mouth/Throat:  ?   Mouth: Mucous membranes are moist.  ?   Pharynx: Oropharynx is clear.  ?Eyes:  ?   General: No scleral icterus. ?   Extraocular Movements: Extraocular movements intact.  ?   Conjunctiva/sclera: Conjunctivae normal.  ?Cardiovascular:  ?   Rate and Rhythm: Regular rhythm. Tachycardia present.  ?   Heart sounds: No murmur heard. ?Pulmonary:  ?   Effort: Pulmonary effort is normal. No respiratory distress.  ?   Breath sounds: Normal breath sounds.  ?Abdominal:  ?   Palpations: Abdomen is soft.  ?   Tenderness: There is abdominal tenderness in the epigastric area. There is no  guarding or rebound.  ?Musculoskeletal:     ?   General: No swelling.  ?   Cervical back: Neck supple.  ?Skin: ?   General: Skin is warm and dry.  ?   Capillary Refill: Capillary refill takes less than 2 seconds.  ?   Coloration: Skin is not cyanotic or jaundiced.  ?Neurological:  ?   General: No focal deficit present.  ?   Mental Status: She is alert and oriented to person, place, and time.  ?   Cranial Nerves: No cranial nerve deficit.  ?   Motor: Tremor present. No weakness.  ?Psychiatric:     ?   Mood and Affect: Mood normal.     ?   Behavior: Behavior normal.  ? ? ?ED Results / Procedures / Treatments   ?Labs ?(all labs ordered are listed, but only abnormal results are displayed) ?Labs Reviewed  ?COMPREHENSIVE METABOLIC PANEL - Abnormal; Notable for the following components:  ?    Result Value  ? Sodium 132 (*)   ? Chloride 96 (*)   ? CO2 19 (*)   ? Glucose, Bld 112 (*)   ? BUN <5 (*)   ? Total Protein 8.2 (*)   ? AST 108 (*)   ? ALT 62 (*)   ? Total Bilirubin 2.0 (*)   ? Anion gap 17 (*)   ? All other components within normal limits  ?URINALYSIS, ROUTINE W REFLEX MICROSCOPIC - Abnormal; Notable for the following components:  ? APPearance HAZY (*)   ? Hgb urine dipstick MODERATE (*)   ? Ketones, ur 80 (*)   ? Protein, ur 100 (*)   ? All other components within normal limits  ?LIPASE, BLOOD  ?CBC WITH DIFFERENTIAL/PLATELET  ?CK  ?I-STAT BETA HCG BLOOD, ED ([facility], [facility], [facility] ONLY)  ?TROPONIN I (HIGH SENSITIVITY)  ?TROPONIN I (HIGH SENSITIVITY)  ? ? ?EKG ?EKG Interpretation ? ?Date/Time:  Saturday Aug 27 2021 12:51:07 EDT ?Ventricular Rate:  105 ?PR Interval:  124 ?QRS Duration: 70 ?QT Interval:  340 ?QTC Calculation: 449 ?R Axis:   75 ?Text Interpretation: Sinus tachycardia Right atrial enlargement Nonspecific T wave abnormality Abnormal ECG Confirmed by Gloris Manchester [phone]) on 08/27/2021 3:40:31 PM ? ?Radiology ?CT ABDOMEN PELVIS W CONTRAST ? ?Result Date: 08/27/2021 ?CLINICAL DATA:  Nausea/vomiting Abdominal pain, acute,  nonlocalized EXAM: CT ABDOMEN AND PELVIS WITH CONTRAST TECHNIQUE: Multidetector CT imaging of the abdomen and pelvis was performed using the standard protocol following bolus administration of intravenous contrast. RADIATION DOSE REDUCTION: This exam was performed according to the departmental dose-optimization program which includes automated exposure control, adjustment of the mA and/or kV according to patient size and/or use of iterative reconstruction technique. CONTRAST:  OMNIPAQUE IOHEXOL 300 MG/ML  SOLN COMPARISON:  CT dated June 17, 2019 FINDINGS: Lower chest: Bibasilar atelectasis. Hepatobiliary: Diffuse hepatic steatosis with regional decreased radiodensity adjacent to the falciform ligament likely reflecting an area of even greater focal hepatic steatosis. Hypertrophy of the LEFT liver. Gallbladder is distended without evidence of acute cholecystitis. Pancreas: Unremarkable. No pancreatic ductal dilatation or surrounding inflammatory changes. Spleen: Normal in size without focal abnormality. Adrenals/Urinary Tract: Adrenal glands are unremarkable. Kidneys are normal, without renal calculi, focal lesion, or hydronephrosis. Bladder is unremarkable. Stomach/Bowel: Stomach is within normal limits. Appendix appears normal. No evidence of bowel wall thickening, distention, or inflammatory changes. Vascular/Lymphatic: Retroaortic LEFT renal vein. No significant vascular findings are present. No enlarged abdominal or pelvic lymph nodes. Reproductive: Status post hysterectomy. No adnexal masses. Other: No free air or free fluid. Musculoskeletal: No acute or significant osseous findings. IMPRESSION: 1. No CT etiology for epigastric pain identified. 2. Hepatic steatosis. Electronically Signed   By: Meda Klinefelter M.D.   On: 08/27/2021 20:25   ? ?Procedures ?Procedures  ? ? ?Medications Ordered in ED ?Medications  ?chlordiazePOXIDE (LIBRIUM) capsule 50 mg (has no administration in time range)  ?LORazepam  (ATIVAN) injection 1 mg (1 mg Intramuscular Given 08/27/21 1422)  ?lactated ringers bolus 2,000 mL (2,000 mLs Intravenous New Bag/Given 08/27/21 1620)  ?ondansetron (ZOFRAN) injection 4 mg (4 mg Intravenous Given

## 2021-12-20 ENCOUNTER — Observation Stay (HOSPITAL_COMMUNITY): Payer: Self-pay

## 2021-12-20 ENCOUNTER — Encounter (HOSPITAL_COMMUNITY): Payer: Self-pay | Admitting: Emergency Medicine

## 2021-12-20 ENCOUNTER — Inpatient Hospital Stay (HOSPITAL_COMMUNITY)
Admission: EM | Admit: 2021-12-20 | Discharge: 2021-12-24 | DRG: 439 | Disposition: A | Discharge status: Home / Self Care | Payer: Self-pay | Attending: Internal Medicine | Admitting: Internal Medicine

## 2021-12-20 DIAGNOSIS — K219 Gastro-esophageal reflux disease without esophagitis: Secondary | ICD-10-CM | POA: Diagnosis present

## 2021-12-20 DIAGNOSIS — K852 Alcohol induced acute pancreatitis without necrosis or infection: Principal | ICD-10-CM | POA: Diagnosis present

## 2021-12-20 DIAGNOSIS — F1011 Alcohol abuse, in remission: Secondary | ICD-10-CM | POA: Diagnosis present

## 2021-12-20 DIAGNOSIS — E872 Acidosis, unspecified: Secondary | ICD-10-CM | POA: Diagnosis present

## 2021-12-20 DIAGNOSIS — Z20822 Contact with and (suspected) exposure to covid-19: Secondary | ICD-10-CM | POA: Diagnosis present

## 2021-12-20 DIAGNOSIS — E8729 Other acidosis: Secondary | ICD-10-CM | POA: Diagnosis present

## 2021-12-20 DIAGNOSIS — R112 Nausea with vomiting, unspecified: Secondary | ICD-10-CM

## 2021-12-20 DIAGNOSIS — E871 Hypo-osmolality and hyponatremia: Secondary | ICD-10-CM

## 2021-12-20 DIAGNOSIS — E876 Hypokalemia: Secondary | ICD-10-CM

## 2021-12-20 DIAGNOSIS — R739 Hyperglycemia, unspecified: Secondary | ICD-10-CM | POA: Diagnosis present

## 2021-12-20 DIAGNOSIS — Z90710 Acquired absence of both cervix and uterus: Secondary | ICD-10-CM

## 2021-12-20 DIAGNOSIS — Z813 Family history of other psychoactive substance abuse and dependence: Secondary | ICD-10-CM

## 2021-12-20 DIAGNOSIS — Z79899 Other long term (current) drug therapy: Secondary | ICD-10-CM

## 2021-12-20 DIAGNOSIS — K2920 Alcoholic gastritis without bleeding: Secondary | ICD-10-CM | POA: Diagnosis present

## 2021-12-20 DIAGNOSIS — K5900 Constipation, unspecified: Secondary | ICD-10-CM | POA: Diagnosis present

## 2021-12-20 DIAGNOSIS — Z809 Family history of malignant neoplasm, unspecified: Secondary | ICD-10-CM

## 2021-12-20 DIAGNOSIS — F1010 Alcohol abuse, uncomplicated: Secondary | ICD-10-CM | POA: Diagnosis present

## 2021-12-20 DIAGNOSIS — E860 Dehydration: Secondary | ICD-10-CM | POA: Diagnosis present

## 2021-12-20 LAB — CBC WITH DIFFERENTIAL/PLATELET
Abs Immature Granulocytes: 0.07 10*3/uL (ref 0.00–0.07)
Basophils Absolute: 0.1 10*3/uL (ref 0.0–0.1)
Basophils Relative: 1 %
Eosinophils Absolute: 0 10*3/uL (ref 0.0–0.5)
Eosinophils Relative: 0 %
HCT: 41.5 % (ref 36.0–46.0)
Hemoglobin: 14.1 g/dL (ref 12.0–15.0)
Immature Granulocytes: 1 %
Lymphocytes Relative: 11 %
Lymphs Abs: 1.1 10*3/uL (ref 0.7–4.0)
MCH: 30.1 pg (ref 26.0–34.0)
MCHC: 34 g/dL (ref 30.0–36.0)
MCV: 88.7 fL (ref 80.0–100.0)
Monocytes Absolute: 0.8 10*3/uL (ref 0.1–1.0)
Monocytes Relative: 8 %
Neutro Abs: 7.8 10*3/uL — ABNORMAL HIGH (ref 1.7–7.7)
Neutrophils Relative %: 79 %
Platelets: 186 10*3/uL (ref 150–400)
RBC: 4.68 MIL/uL (ref 3.87–5.11)
RDW: 14.7 % (ref 11.5–15.5)
WBC: 9.9 10*3/uL (ref 4.0–10.5)
nRBC: 0.4 % — ABNORMAL HIGH (ref 0.0–0.2)

## 2021-12-20 LAB — URINALYSIS, ROUTINE W REFLEX MICROSCOPIC
Bacteria, UA: NONE SEEN
Glucose, UA: NEGATIVE mg/dL
Ketones, ur: 80 mg/dL — AB
Leukocytes,Ua: NEGATIVE
Nitrite: NEGATIVE
Protein, ur: >=300 mg/dL — AB
Specific Gravity, Urine: 1.026 (ref 1.005–1.030)
pH: 5 (ref 5.0–8.0)

## 2021-12-20 LAB — MAGNESIUM: Magnesium: 2.3 mg/dL (ref 1.7–2.4)

## 2021-12-20 LAB — ETHANOL: Alcohol, Ethyl (B): <10 mg/dL (ref <10)

## 2021-12-20 LAB — COMPREHENSIVE METABOLIC PANEL
ALT: 173 U/L — ABNORMAL HIGH (ref 0–44)
AST: 292 U/L — ABNORMAL HIGH (ref 15–41)
Albumin: 4.9 g/dL (ref 3.5–5.0)
Alkaline Phosphatase: 89 U/L (ref 38–126)
Anion gap: 18 — ABNORMAL HIGH (ref 5–15)
BUN: 11 mg/dL (ref 6–20)
CO2: 16 mmol/L — ABNORMAL LOW (ref 22–32)
Calcium: 10.4 mg/dL — ABNORMAL HIGH (ref 8.9–10.3)
Chloride: 99 mmol/L (ref 98–111)
Creatinine, Ser: 1.29 mg/dL — ABNORMAL HIGH (ref 0.44–1.00)
GFR, Estimated: 52 mL/min — ABNORMAL LOW (ref >60)
Glucose, Bld: 116 mg/dL — ABNORMAL HIGH (ref 70–99)
Potassium: 3.5 mmol/L (ref 3.5–5.1)
Sodium: 133 mmol/L — ABNORMAL LOW (ref 135–145)
Total Bilirubin: 2.2 mg/dL — ABNORMAL HIGH (ref 0.3–1.2)
Total Protein: 8.8 g/dL — ABNORMAL HIGH (ref 6.5–8.1)

## 2021-12-20 LAB — PROTIME-INR
INR: 1.2 (ref 0.8–1.2)
Prothrombin Time: 14.7 seconds (ref 11.4–15.2)

## 2021-12-20 LAB — LIPASE, BLOOD: Lipase: 166 U/L — ABNORMAL HIGH (ref 11–51)

## 2021-12-20 MED ORDER — LORAZEPAM 2 MG/ML IJ SOLN
1.0000 mg | INTRAMUSCULAR | Status: AC | PRN
  Filled 2021-12-20: qty 1

## 2021-12-20 MED ORDER — MORPHINE SULFATE (PF) 2 MG/ML IV SOLN
2.0000 mg | INTRAVENOUS | Status: DC | PRN
  Administered 2021-12-21 – 2021-12-24 (×13): 2 mg via INTRAVENOUS
  Filled 2021-12-20 (×13): qty 1

## 2021-12-20 MED ORDER — ADULT MULTIVITAMIN W/MINERALS CH
1.0000 | ORAL_TABLET | Freq: Every day | ORAL | Status: DC
  Administered 2021-12-21 – 2021-12-24 (×4): 1 via ORAL
  Filled 2021-12-20 (×5): qty 1

## 2021-12-20 MED ORDER — THIAMINE MONONITRATE 100 MG PO TABS
100.0000 mg | ORAL_TABLET | Freq: Every day | ORAL | Status: DC
  Administered 2021-12-21 – 2021-12-24 (×5): 100 mg via ORAL
  Filled 2021-12-20 (×5): qty 1

## 2021-12-20 MED ORDER — SODIUM CHLORIDE 0.9 % IV SOLN: INTRAVENOUS | Status: DC

## 2021-12-20 MED ORDER — FOLIC ACID 1 MG PO TABS
1.0000 mg | ORAL_TABLET | Freq: Every day | ORAL | Status: DC
  Administered 2021-12-21 – 2021-12-24 (×5): 1 mg via ORAL
  Filled 2021-12-20 (×5): qty 1

## 2021-12-20 MED ORDER — ACETAMINOPHEN 650 MG RE SUPP: 650.0000 mg | Freq: Four times a day (QID) | RECTAL | Status: DC | PRN

## 2021-12-20 MED ORDER — ONDANSETRON HCL 4 MG PO TABS: 4.0000 mg | ORAL_TABLET | Freq: Four times a day (QID) | ORAL | Status: DC | PRN

## 2021-12-20 MED ORDER — ONDANSETRON 4 MG PO TBDP
4.0000 mg | ORAL_TABLET | Freq: Once | ORAL | Status: AC
Start: 2021-12-20 — End: 2021-12-20
  Administered 2021-12-20: 4 mg via ORAL
  Filled 2021-12-20: qty 1

## 2021-12-20 MED ORDER — DEXTROSE-NACL 5-0.45 % IV SOLN
INTRAVENOUS | Status: DC
Start: 2021-12-20 — End: 2021-12-24

## 2021-12-20 MED ORDER — ALUM & MAG HYDROXIDE-SIMETH 200-200-20 MG/5ML PO SUSP
30.0000 mL | Freq: Once | ORAL | Status: AC
  Administered 2021-12-20: 30 mL via ORAL
  Filled 2021-12-20: qty 30

## 2021-12-20 MED ORDER — HYDROMORPHONE HCL 1 MG/ML IJ SOLN
0.5000 mg | Freq: Once | INTRAMUSCULAR | Status: AC
  Administered 2021-12-20: 0.5 mg via INTRAVENOUS
  Filled 2021-12-20: qty 1

## 2021-12-20 MED ORDER — ACETAMINOPHEN 325 MG PO TABS: 650.0000 mg | ORAL_TABLET | Freq: Four times a day (QID) | ORAL | Status: DC | PRN

## 2021-12-20 MED ORDER — ONDANSETRON HCL 4 MG/2ML IJ SOLN
4.0000 mg | Freq: Four times a day (QID) | INTRAMUSCULAR | Status: DC | PRN
  Administered 2021-12-21 (×2): 4 mg via INTRAVENOUS
  Filled 2021-12-20 (×2): qty 2

## 2021-12-20 MED ORDER — THIAMINE HCL 100 MG/ML IJ SOLN
100.0000 mg | Freq: Every day | INTRAMUSCULAR | Status: DC
  Filled 2021-12-20: qty 1

## 2021-12-20 MED ORDER — ONDANSETRON HCL 4 MG/2ML IJ SOLN
4.0000 mg | Freq: Once | INTRAMUSCULAR | Status: AC
  Administered 2021-12-20: 4 mg via INTRAVENOUS
  Filled 2021-12-20: qty 2

## 2021-12-20 MED ORDER — LACTATED RINGERS IV BOLUS
1000.0000 mL | Freq: Once | INTRAVENOUS | Status: AC
  Administered 2021-12-20: 1000 mL via INTRAVENOUS

## 2021-12-20 MED ORDER — PANTOPRAZOLE SODIUM 40 MG IV SOLR
40.0000 mg | Freq: Once | INTRAVENOUS | Status: AC
  Administered 2021-12-20: 40 mg via INTRAVENOUS
  Filled 2021-12-20: qty 10

## 2021-12-20 MED ORDER — LORAZEPAM 1 MG PO TABS
1.0000 mg | ORAL_TABLET | ORAL | Status: AC | PRN
  Administered 2021-12-21 – 2021-12-22 (×4): 1 mg via ORAL
  Filled 2021-12-20 (×4): qty 1

## 2021-12-20 MED ORDER — LORAZEPAM 2 MG/ML IJ SOLN
1.0000 mg | Freq: Once | INTRAMUSCULAR | Status: AC
  Administered 2021-12-20: 1 mg via INTRAVENOUS
  Filled 2021-12-20: qty 1

## 2021-12-20 MED ORDER — ENOXAPARIN SODIUM 40 MG/0.4ML IJ SOSY
40.0000 mg | PREFILLED_SYRINGE | INTRAMUSCULAR | Status: DC
  Administered 2021-12-21 – 2021-12-24 (×4): 40 mg via SUBCUTANEOUS
  Filled 2021-12-20 (×4): qty 0.4

## 2021-12-20 NOTE — Assessment & Plan Note (Addendum)
-  CIWA protocol initiated while inpatient; no withdrawals appreciated. -Cessation counseling provided -Patient instructed to take thiamine and folic acid on a daily basis.   -She is motivated to quit.

## 2021-12-20 NOTE — Assessment & Plan Note (Addendum)
-  In the setting of alcohol ketosis. -Improved/resolved with fluid resuscitation -Continue to follow electrolytes.

## 2021-12-20 NOTE — ED Provider Notes (Signed)
[facility] EMERGENCY DEPARTMENT Provider Note   CSN: [id number] Arrival date & time: 12/20/21  1156     History  Chief Complaint  Patient presents with   Abdominal Pain   Emesis    Lincoln Kleiner is a 46 y.o. female.  Patient presents to the emergency department today for evaluation of abdominal pain and vomiting.  Patient has a history of alcohol use.  She states that she was drinking heavily over the past week, last drink was 12/18/2021.  At that time, she developed upper abdominal pain that radiates to her lower back.  She has had frequent episodes of vomiting.  She has been unable to tolerate liquids or solids.  No fever, chest pain or shortness of breath.  She does report throat pain with swallowing.  No urinary symptoms reported although she has had dark urine.       Home Medications Prior to Admission medications   Medication Sig Start Date End Date Taking? Authorizing Provider  b complex vitamins capsule Take 1 capsule by mouth daily.    [provider]  chlordiazePOXIDE (LIBRIUM) 25 MG capsule 50mg  PO TID x 1D, then 25-50mg  PO BID X 1D, then 25-50mg  PO QD X 1D 08/27/21   08/29/21, MD  fluticasone (FLONASE) 50 MCG/ACT nasal spray Place 1 spray into both nostrils daily as needed for allergies or rhinitis.    [provider]  Multiple Vitamin (MULTIVITAMINS PO) Take 1 tablet by mouth daily.    [provider]  omeprazole (PRILOSEC) 40 MG capsule Take 1 capsule (40 mg total) by mouth in the morning and at bedtime. Patient not taking: Reported on 08/27/2021 08/27/21   08/29/21, MD  ondansetron (ZOFRAN-ODT) 4 MG disintegrating tablet Take 1 tablet (4 mg total) by mouth every 8 (eight) hours as needed for nausea or vomiting. 08/27/21   08/29/21, MD  sucralfate (CARAFATE) 1 GM/10ML suspension Take 10 mLs (1 g total) by mouth 4 (four) times daily -  with meals and at bedtime. 08/27/21   08/29/21, MD  famotidine (PEPCID) 20 MG tablet  Take 1 tablet (20 mg total) by mouth daily. Patient not taking: Reported on 10/28/2019 05/19/19 04/06/20  04/08/20, MD      Allergies    Fluconazole and Hydrocodone    Review of Systems   Review of Systems  Physical Exam Updated Vital Signs BP 123/88 (BP Location: Right Arm)   Pulse (!) 131   Temp 98.1 F (36.7 C)   Resp 15   Ht 5\' 6"  (1.676 m)   Wt 72 kg   LMP 06/19/2013   SpO2 100%   BMI 25.62 kg/m   Physical Exam Vitals and nursing note reviewed.  Constitutional:      General: She is not in acute distress.    Appearance: She is well-developed.  HENT:     Head: Normocephalic and atraumatic.     Right Ear: External ear normal.     Left Ear: External ear normal.     Nose: Nose normal.  Eyes:     Conjunctiva/sclera: Conjunctivae normal.  Cardiovascular:     Rate and Rhythm: Regular rhythm. Tachycardia present.     Heart sounds: No murmur heard. Pulmonary:     Effort: No respiratory distress.     Breath sounds: No wheezing, rhonchi or rales.  Abdominal:     Palpations: Abdomen is soft.     Tenderness: There is abdominal tenderness in the epigastric area, periumbilical area and  left upper quadrant. There is no guarding or rebound. Negative signs include Murphy's sign and McBurney's sign.  Musculoskeletal:     Cervical back: Normal range of motion and neck supple.     Right lower leg: No edema.     Left lower leg: No edema.  Skin:    General: Skin is warm and dry.     Findings: No rash.  Neurological:     General: No focal deficit present.     Mental Status: She is alert. Mental status is at baseline.     Motor: No weakness.  Psychiatric:        Mood and Affect: Mood normal.     ED Results / Procedures / Treatments   Labs (all labs ordered are listed, but only abnormal results are displayed) Labs Reviewed  CBC WITH DIFFERENTIAL/PLATELET - Abnormal; Notable for the following components:      Result Value   nRBC 0.4 (*)    Neutro Abs 7.8 (*)    All  other components within normal limits  COMPREHENSIVE METABOLIC PANEL - Abnormal; Notable for the following components:   Sodium 133 (*)    CO2 16 (*)    Glucose, Bld 116 (*)    Creatinine, Ser 1.29 (*)    Calcium 10.4 (*)    Total Protein 8.8 (*)    AST 292 (*)    ALT 173 (*)    Total Bilirubin 2.2 (*)    GFR, Estimated 52 (*)    Anion gap 18 (*)    All other components within normal limits  LIPASE, BLOOD - Abnormal; Notable for the following components:   Lipase 166 (*)    All other components within normal limits  URINALYSIS, ROUTINE W REFLEX MICROSCOPIC - Abnormal; Notable for the following components:   Color, Urine AMBER (*)    APPearance CLOUDY (*)    Hgb urine dipstick SMALL (*)    Bilirubin Urine SMALL (*)    Ketones, ur 80 (*)    Protein, ur >=300 (*)    All other components within normal limits  ETHANOL  PROTIME-INR  MAGNESIUM    EKG EKG Interpretation  Date/Time:  Tuesday December 20 2021 12:28:59 EDT Ventricular Rate:  134 PR Interval:  122 QRS Duration: 74 QT Interval:  294 QTC Calculation: 439 R Axis:   79 Text Interpretation: Sinus tachycardia ST & T wave abnormality, consider inferior ischemia ST & T wave abnormality, consider anterolateral ischemia  similar to may 2023 Confirmed by Pricilla Loveless [phone]) on 12/20/2021 4:59:57 PM  Radiology No results found.  Procedures Procedures    Medications Ordered in ED Medications  HYDROmorphone (DILAUDID) injection 0.5 mg (has no administration in time range)  0.9 %  sodium chloride infusion (has no administration in time range)  ondansetron (ZOFRAN-ODT) disintegrating tablet 4 mg (4 mg Oral Given 12/20/21 1303)  lactated ringers bolus 1,000 mL (0 mLs Intravenous Stopped 12/20/21 1847)  lactated ringers bolus 1,000 mL (0 mLs Intravenous Stopped 12/20/21 1847)  LORazepam (ATIVAN) injection 1 mg (1 mg Intravenous Given 12/20/21 1635)  pantoprazole (PROTONIX) injection 40 mg (40 mg Intravenous Given 12/20/21 1640)   ondansetron (ZOFRAN) injection 4 mg (4 mg Intravenous Given 12/20/21 1640)  alum & mag hydroxide-simeth (MAALOX/MYLANTA) 200-200-20 MG/5ML suspension 30 mL (30 mLs Oral Given 12/20/21 1845)    ED Course/ Medical Decision Making/ A&P    Patient seen and examined. History obtained directly from patient. Work-up including labs, imaging, EKG ordered in triage, if performed, were  reviewed.    Labs/EKG: Independently reviewed and interpreted.  This included: CBC with normal white blood cell count, normal hemoglobin; CMP glucose 116, creatinine mildly elevated at 1.29 with BUN of 11, transaminitis AST 292 ALT 173, bilirubin 2.2, anion gap 18; lipase elevated at 166; ethanol negative; magnesium 2.3. EKG reviewed as above.   Imaging: None ordered  Medications/Fluids: Ordered: LR 2,000cc bolus, IV Ativan for nausea and treatment of potential early developing alcohol withdrawal, IV Zofran for nausea, IV Protonix.  Most recent vital signs reviewed and are as follows: BP 123/88 (BP Location: Right Arm)   Pulse (!) 131   Temp 98.1 F (36.7 C)   Resp 15   Ht 5\' 6"  (1.676 m)   Wt 72 kg   LMP 06/19/2013   SpO2 100%   BMI 25.62 kg/m   Initial impression: Nausea and vomiting in setting of likely alcoholic pancreatitis/alcoholic gastritis, metabolic alkalosis likely due to alcoholic ketoacidosis.  7:16 PM Reassessment performed. Patient appears stable. C/o stomach pain. She has tolerated a few sips of water. HR still into low 120's.   Most current vital signs reviewed and are as follows: BP (!) 121/94   Pulse (!) 111   Temp 98.1 F (36.7 C)   Resp 20   Ht 5\' 6"  (1.676 m)   Wt 72 kg   LMP 06/19/2013   SpO2 100%   BMI 25.62 kg/m   Plan: Pain medication, recheck. Suspect will need admission for symptom control and IV hydration 2/2 alcoholic pancreatitis.   Dr. aware at shift change.                            Medical Decision Making Risk OTC drugs. Prescription drug  management.   For this patient's complaint of abdominal pain, the following conditions were considered on the differential diagnosis: gastritis/PUD, enteritis/duodenitis, appendicitis, cholelithiasis/cholecystitis, cholangitis, pancreatitis, ruptured viscus, colitis, diverticulitis, small/large bowel obstruction, proctitis, cystitis, pyelonephritis, ureteral colic, aortic dissection, aortic aneurysm. In women, ectopic pregnancy, pelvic inflammatory disease, ovarian cysts, and tubo-ovarian abscess were also considered. Atypical chest etiologies were also considered including ACS, PE, and pneumonia.          Final Clinical Impression(s) / ED Diagnoses Final diagnoses:  None    Rx / DC Orders ED Discharge Orders     None         08/19/2013, PA-C 12/20/21 1918    Renne Crigler, MD 12/20/21 [phone]

## 2021-12-20 NOTE — H&P (Signed)
History and Physical    Patient: Danielle Reilly:[id number] DOB: 1975/12/16 DOA: 12/20/2021 DOS: the patient was seen and examined on 12/20/2021 PCP: Pcp, No  Patient coming from: Home  Chief Complaint:  Chief Complaint  Patient presents with   Abdominal Pain   Emesis   HPI: Danielle Reilly is a 46 y.o. female with medical history significant of EtOH abuse.  Pt presents to ED with c/o abd pain, N/V.  She states that she was drinking heavily over the past week, last drink was 12/18/2021.  At that time, she developed upper abdominal pain that radiates to her lower back.  She has had frequent episodes of vomiting.  She has been unable to tolerate liquids or solids.  No fever, chest pain or shortness of breath.  She does report throat pain with swallowing.  No urinary symptoms reported although she has had dark urine.     Review of Systems: As mentioned in the history of present illness. All other systems reviewed and are negative. Past Medical History:  Diagnosis Date   Anemia    GERD (gastroesophageal reflux disease)    tums as needed   Hemoperitoneum 12/25/2010   SVD (spontaneous vaginal delivery)    x 1   Toothache 01/29/2013   Past Surgical History:  Procedure Laterality Date   BILATERAL SALPINGECTOMY N/A 06/19/2013   Procedure: BILATERAL SALPINGECTOMY;  Surgeon: Willodean Rosenthal, MD;  Location: [facility];  Service: Gynecology;  Laterality: N/A;   DILITATION & CURRETTAGE/HYSTROSCOPY WITH NOVASURE ABLATION N/A 04/04/2013   Procedure: DILATATION & CURETTAGE/HYSTEROSCOPY WITH Attempted NOVASURE ABLATION ;  Surgeon: Willodean Rosenthal, MD;  Location: [facility];  Service: Gynecology;  Laterality: N/A;   HYSTEROSCOPY N/A 05/26/2013   Procedure: UNSUCCESSFUL HYSTEROSCOPY WITH HYDROTHERMAL ABLATION;  Surgeon: Willodean Rosenthal, MD;  Location: [facility];  Service: Gynecology;  Laterality: N/A;   LAPAROSCOPY  12/25/2010   Procedure: LAPAROSCOPY OPERATIVE;  Surgeon: Roseanna Rainbow, MD;   Location: [facility];  Service: Gynecology;  Laterality: N/A;  Operative laparoscopy /cauterization of right hemorrhagic cyst wall. Removal of belly rings.   LAPAROSCOPY FOR ECTOPIC PREGNANCY     NOVASURE ABLATION N/A 05/26/2013   Procedure: UNSUCCESSFUL NOVASURE ABLATION;  Surgeon: Willodean Rosenthal, MD;  Location: [facility];  Service: Gynecology;  Laterality: N/A;   uterine biopsy     VAGINAL HYSTERECTOMY N/A 06/19/2013   Procedure:  TOTAL VAGINAL HYSTERECTOMY with bilateral salpingectomy;  Surgeon: Willodean Rosenthal, MD;  Location: [facility];  Service: Gynecology;  Laterality: N/A;   Social History:  reports that she has never smoked. She has never used smokeless tobacco. She reports current alcohol use. She reports that she does not currently use drugs.  Allergies  Allergen Reactions   Fluconazole Hives   Hydrocodone Nausea And Vomiting    Family History  Problem Relation Age of Onset   Drug abuse Mother    Arthritis Maternal Aunt    Cancer Maternal Aunt    Drug abuse Maternal Aunt    Arthritis Maternal Uncle    Alcohol abuse Maternal Uncle    Cancer Maternal Uncle    Drug abuse Maternal Uncle    Cancer Maternal Grandmother    Alcohol abuse Maternal Grandfather    Cancer Maternal Grandfather     Prior to Admission medications   Medication Sig Start Date End Date Taking? Authorizing Provider  b complex vitamins capsule Take 1 capsule by mouth daily.    [provider]  chlordiazePOXIDE (LIBRIUM) 25 MG capsule 50mg  PO TID x 1D, then  25-50mg  PO BID X 1D, then 25-50mg  PO QD X 1D 08/27/21   Gloris Manchester, MD  fluticasone [facility]) 50 MCG/ACT nasal spray Place 1 spray into both nostrils daily as needed for allergies or rhinitis.    [provider]  Multiple Vitamin (MULTIVITAMINS PO) Take 1 tablet by mouth daily.    [provider]  omeprazole (PRILOSEC) 40 MG capsule Take 1 capsule (40 mg total) by mouth in the morning and at bedtime. Patient not taking:  Reported on 08/27/2021 08/27/21   Gloris Manchester, MD  ondansetron (ZOFRAN-ODT) 4 MG disintegrating tablet Take 1 tablet (4 mg total) by mouth every 8 (eight) hours as needed for nausea or vomiting. 08/27/21   Gloris Manchester, MD  sucralfate (CARAFATE) 1 GM/10ML suspension Take 10 mLs (1 g total) by mouth 4 (four) times daily -  with meals and at bedtime. 08/27/21   Gloris Manchester, MD  famotidine (PEPCID) 20 MG tablet Take 1 tablet (20 mg total) by mouth daily. Patient not taking: Reported on 10/28/2019 05/19/19 04/06/20  Jacalyn Lefevre, MD    Physical Exam: Vitals:   12/20/21 1830 12/20/21 1845 12/20/21 2000 12/20/21 2047  BP: (!) 123/92 (!) 121/94 115/81   Pulse: (!) 107 (!) 111 (!) 110   Resp: 20 20 (!) 22   Temp:    98.2 F (36.8 C)  TempSrc:    Oral  SpO2: 100% 100% 100%   Weight:      Height:       Constitutional: NAD, calm, comfortable Eyes: PERRL, lids and conjunctivae normal ENMT: Mucous membranes are moist. Posterior pharynx clear of any exudate or lesions.Normal dentition.  Neck: normal, supple, no masses, no thyromegaly Respiratory: clear to auscultation bilaterally, no wheezing, no crackles. Normal respiratory effort. No accessory muscle use.  Cardiovascular: Tachycardic Abdomen: RUQ TTP, some epigastric TTP. Musculoskeletal: no clubbing / cyanosis. No joint deformity upper and lower extremities. Good ROM, no contractures. Normal muscle tone.  Skin: no rashes, lesions, ulcers. No induration Neurologic: CN 2-12 grossly intact. Sensation intact, DTR normal. Strength 5/5 in all 4.  Psychiatric: Normal judgment and insight. Alert and oriented x 3. Normal mood.   Data Reviewed:    Lipase 166     Latest Ref Rng & Units 12/20/2021    1:09 PM 08/27/2021    1:59 PM 01/26/2021    4:36 PM  CMP  Glucose 70 - 99 mg/dL 761  607  98   BUN 6 - 20 mg/dL 11  <5  5   Creatinine 0.44 - 1.00 mg/dL 3.71  0.62  6.94   Sodium 135 - 145 mmol/L 133  132  133   Potassium 3.5 - 5.1 mmol/L 3.5  4.0  3.6    Chloride 98 - 111 mmol/L 99  96  98   CO2 22 - 32 mmol/L 16  19  22    Calcium 8.9 - 10.3 mg/dL  9.8  85.4   Total Protein 6.5 - 8.1 g/dL 8.8  8.2  7.8   Total Bilirubin 0.3 - 1.2 mg/dL 2.2  2.0  1.9   Alkaline Phos 38 - 126 U/L 89  61  56   AST 15 - 41 U/L 292  108  53   ALT 0 - 44 U/L 173  62  32    CBC wnl  INR 1.2  Urinalysis    Component Value Date/Time   COLORURINE AMBER (A) 12/20/2021 1534   APPEARANCEUR CLOUDY (A) 12/20/2021 1534   LABSPEC 1.026 12/20/2021  1534   PHURINE 5.0 12/20/2021 1534   GLUCOSEU NEGATIVE 12/20/2021 1534   HGBUR SMALL (A) 12/20/2021 1534   BILIRUBINUR SMALL (A) 12/20/2021 1534   KETONESUR 80 (A) 12/20/2021 1534   PROTEINUR >=300 (A) 12/20/2021 1534   UROBILINOGEN 0.2 11/14/2014 1755   NITRITE NEGATIVE 12/20/2021 1534   LEUKOCYTESUR NEGATIVE 12/20/2021 1534      Assessment and Plan: * Acute alcoholic pancreatitis Elevated lipase and presentation most suspicious for EtOH pancreatitis.  Possibly with EtOH gastritis and/or EtOH hepatitis. Maddrey score will be low with T.Bili only being 2.2, INR 1.2 -> no indication for steroids IVF Pain control Repeat CMP in AM Abd [place] NPO  Increased anion gap metabolic acidosis Lactic acidosis vs EtOH ketosis. Check BHB Check lactate 80 keytones in urine Switch IVF to D5 half NS Repeat CMP in AM  History of alcohol abuse CIWA      Advance Care Planning:   Code Status: Full Code  Consults: None  Family Communication: No family in room  Severity of Illness: The appropriate patient status for this patient is OBSERVATION. Observation status is judged to be reasonable and necessary in order to provide the required intensity of service to ensure the patient's safety. The patient's presenting symptoms, physical exam findings, and initial radiographic and laboratory data in the context of their medical condition is felt to place them at decreased risk for further clinical deterioration.  Furthermore, it is anticipated that the patient will be medically stable for discharge from the hospital within 2 midnights of admission.   Author: Hillary Bow., DO 12/20/2021 9:02 PM  For on call review [email or web address].

## 2021-12-20 NOTE — ED Provider Triage Note (Signed)
Emergency Medicine Provider Triage Evaluation Note  Danielle Reilly , a 46 y.o. female  was evaluated in triage.  Pt complains of 46 year old female presents the ER today for evaluation of concern for "alcohol poisoning".  Patient states that she has a longstanding history of alcohol abuse.  She reports that she had quit for quite some time but started back up about a week ago.  She reports drinking daily.  Her last drink was 2 days ago.  She reports associated nausea, vomiting, epigastric abdominal pain, constipation and decreased urinary output.  Patient has tried Zofran without relief of symptoms.  Patient reports feeling very anxious.  Patient denies drug use.  Reports prior hysterectomy.  Review of Systems  Positive: Nausea, vomiting, abdominal pain, constipation, decreased urinary output, anxiety Negative: Blood in her stool, urgency, frequency, fevers  Physical Exam  BP 126/89   Pulse (!) 140   Temp 98 F (36.7 C) (Oral)   Resp 16   Ht 5\' 6"  (1.676 m)   Wt 72 kg   LMP 06/19/2013   SpO2 100%   BMI 25.62 kg/m  Gen:   Awake, no distress answers questions appropriate does appear anxious. Resp:  Normal effort  MSK:   Moves extremities without difficulty  Other:  Patient with mild tenderness palpation of the epigastric region.  Bowel sounds are present.  Patient does have tachycardia noted.  There is no rubs murmurs or gallops appreciated.  Lungs clear to auscultation.  Pulses 2+  Medical Decision Making  Medically screening exam initiated at 1:02 PM.  Appropriate orders placed.  Danielle Reilly was informed that the remainder of the evaluation will be completed by another provider, this initial triage assessment does not replace that evaluation, and the importance of remaining in the ED until their evaluation is complete.  Patient presents for nausea, vomiting, constipation, abdominal pain, alcohol use.  Patient noted to be tachycardic in the ED.  She reports her last drink was 2 days ago.   Patient does appear anxious.  Mild pain to palpation of the epigastric region.  Differential diagnosis includes pancreatitis, gastritis, dehydration, cirrhosis, alcohol withdrawal.  Lab work is indicated at this time.  This is pending.  Will defer imaging at this time.   08/19/2013, PA-C 12/20/21 1304

## 2021-12-20 NOTE — ED Triage Notes (Signed)
Pt states she hasn't eaten in a week. Unable to keep anything down, abd pain. Pt reports drinking etoh a lot last week. Last drink Sunday.

## 2021-12-20 NOTE — Assessment & Plan Note (Addendum)
-  Elevated lipase and presentation most suspicious for EtOH pancreatitis.   1. -No gallstones or biliary abnormalities appreciated on ultrasound. 2. -Excellent response to bowel rest, fluid resuscitation, analgesics and antiemetics. 3. -At time of discharge patient able to tolerate diet, improvement appreciated on her lipase level and no further episodes vomiting reported. 4. -Alcohol cessation and complete abstinence recommended. 5. -Patient advised to maintain adequate hydration and to continue using as needed pain medications and antiemetics. 6. -Low-fat diet encouraged.

## 2021-12-21 ENCOUNTER — Other Ambulatory Visit: Payer: Self-pay

## 2021-12-21 DIAGNOSIS — E871 Hypo-osmolality and hyponatremia: Secondary | ICD-10-CM

## 2021-12-21 LAB — CBC
HCT: 31.2 % — ABNORMAL LOW (ref 36.0–46.0)
Hemoglobin: 11 g/dL — ABNORMAL LOW (ref 12.0–15.0)
MCH: 31 pg (ref 26.0–34.0)
MCHC: 35.3 g/dL (ref 30.0–36.0)
MCV: 87.9 fL (ref 80.0–100.0)
Platelets: 137 10*3/uL — ABNORMAL LOW (ref 150–400)
RBC: 3.55 MIL/uL — ABNORMAL LOW (ref 3.87–5.11)
RDW: 14.7 % (ref 11.5–15.5)
WBC: 9.8 10*3/uL (ref 4.0–10.5)
nRBC: 0 % (ref 0.0–0.2)

## 2021-12-21 LAB — COMPREHENSIVE METABOLIC PANEL
ALT: 128 U/L — ABNORMAL HIGH (ref 0–44)
AST: 190 U/L — ABNORMAL HIGH (ref 15–41)
Albumin: 3.6 g/dL (ref 3.5–5.0)
Alkaline Phosphatase: 66 U/L (ref 38–126)
Anion gap: 10 (ref 5–15)
BUN: 9 mg/dL (ref 6–20)
CO2: 20 mmol/L — ABNORMAL LOW (ref 22–32)
Calcium: 8.9 mg/dL (ref 8.9–10.3)
Chloride: 104 mmol/L (ref 98–111)
Creatinine, Ser: 0.94 mg/dL (ref 0.44–1.00)
GFR, Estimated: >60 mL/min (ref >60)
Glucose, Bld: 137 mg/dL — ABNORMAL HIGH (ref 70–99)
Potassium: 3.1 mmol/L — ABNORMAL LOW (ref 3.5–5.1)
Sodium: 134 mmol/L — ABNORMAL LOW (ref 135–145)
Total Bilirubin: 1.4 mg/dL — ABNORMAL HIGH (ref 0.3–1.2)
Total Protein: 6.3 g/dL — ABNORMAL LOW (ref 6.5–8.1)

## 2021-12-21 LAB — HIV ANTIBODY (ROUTINE TESTING W REFLEX): HIV Screen 4th Generation wRfx: NONREACTIVE

## 2021-12-21 LAB — BETA-HYDROXYBUTYRIC ACID: Beta-Hydroxybutyric Acid: 2.37 mmol/L — ABNORMAL HIGH (ref 0.05–0.27)

## 2021-12-21 LAB — LACTIC ACID, PLASMA: Lactic Acid, Venous: 1 mmol/L (ref 0.5–1.9)

## 2021-12-21 MED ORDER — POTASSIUM CHLORIDE CRYS ER 20 MEQ PO TBCR
40.0000 meq | EXTENDED_RELEASE_TABLET | ORAL | Status: AC
  Administered 2021-12-21: 40 meq via ORAL
  Filled 2021-12-21 (×2): qty 2

## 2021-12-21 NOTE — TOC Initial Note (Addendum)
Transition of Care [facility]) - Initial/Assessment Note    Patient Details  Name: Danielle Reilly MRN: [id number] Date of Birth: 1975-09-04  [facility] CM/SW Contact:    Curlene Labrum, RN Phone Number: 12/21/2021, 3:01 PM  Clinical Narrative:                 CM met with the patient to discuss transitions of care needs and CAGE assessment.  The patient states that she is a Binge drinker and has been drinking ETOH (liquor drinks) heavily and presents to ER with abdominal pain and acute pancreatitis.  CAGE Assessment was completed and the patient was given OP Substance Abuse Counseling Resources.  PCP clinic visit to be set up with [facility] and Wellness clinic and documented in discharge follow up.  The patient plans to have a friend transport her home by care once she is medically stable for discharge to home.  Expected Discharge Plan: Home/Self Care Barriers to Discharge: Continued Medical Work up   Patient Goals and CMS Choice Patient states their goals for this hospitalization and ongoing recovery are:: to get better CMS Medicare.gov Compare Post Acute Care list provided to:: Patient Choice offered to / list presented to : Patient  Expected Discharge Plan and Services Expected Discharge Plan: Home/Self Care In-house Referral: PCP / [facility] Discharge Planning Services: CM Consult Post Acute Care Choice: Resumption of Svcs/PTA Provider Living arrangements for the past 2 months: Apartment                                      Prior Living Arrangements/Services Living arrangements for the past 2 months: Apartment Lives with:: Adult Children Patient language and need for interpreter reviewed:: Yes Do you feel safe going back to the place where you live?: Yes      Need for Family Participation in Patient Care: Yes (Comment) Care giver support system in place?: Yes (comment)   Criminal Activity/Legal Involvement Pertinent to Current  Situation/Hospitalization: No - Comment as needed  Activities of Daily Living Home Assistive Devices/Equipment: None ADL Screening (condition at time of admission) Patient's cognitive ability adequate to safely complete daily activities?: Yes Is the patient deaf or have difficulty hearing?: No Does the patient have difficulty seeing, even when wearing glasses/contacts?: No Does the patient have difficulty concentrating, remembering, or making decisions?: No Patient able to express need for assistance with ADLs?: Yes Does the patient have difficulty dressing or bathing?: No Independently performs ADLs?: Yes (appropriate for developmental age) Does the patient have difficulty walking or climbing stairs?: No Weakness of Legs: None Weakness of Arms/Hands: None  Permission Sought/Granted Permission sought to share information with : Case Manager, PCP Permission granted to share information with : Yes, Verbal Permission Granted     Permission granted to share info w AGENCY: [facility] clinic for follow up, OP Substance Abuse Resources        Emotional Assessment Appearance:: Appears stated age Attitude/Demeanor/Rapport: Gracious Affect (typically observed): Accepting Orientation: : Oriented to Self, Oriented to Place, Oriented to  Time, Oriented to Situation Alcohol / Substance Use: Tobacco Use, Alcohol Use Psych Involvement: No (comment)  Admission diagnosis:  Acute alcoholic pancreatitis [Z32.99] Alcohol-induced acute pancreatitis, unspecified complication status [M42.68] Nausea and vomiting, unspecified vomiting type [R11.2] Patient Active Problem List   Diagnosis Date Noted   Hyponatremia 34/19/6222   Acute alcoholic pancreatitis 97/98/9211   Generalized anxiety disorder 12/06/2020  History of gastritis 12/06/2020   History of alcohol abuse 12/06/2020   Rash and nonspecific skin eruption 12/06/2020   Hypokalemia 06/17/2019   Increased anion gap metabolic acidosis 92/44/6286    GERD (gastroesophageal reflux disease) 06/17/2019   PCP:  Merryl Hacker, No Pharmacy:   [place] ([place]), [place] - [address] [place] ([place]) [place] [address] Phone: [phone] Fax: [place] [address] [place] [address] Phone: [phone] Fax: [phone]     Social Determinants of Health (SDOH) Interventions Housing Interventions: Intervention Not Indicated  Readmission Risk Interventions    12/21/2021    2:59 PM  Readmission Risk Prevention Plan  Post Dischage Appt Complete  Medication Screening Complete  Transportation Screening Complete

## 2021-12-21 NOTE — TOC CAGE-AID Note (Signed)
Transition of Care [facility]) - CAGE-AID Screening   Patient Details  Name: Danielle Reilly MRN: [id number] Date of Birth: 1975/07/25  Transition of Care [facility]) CM/SW Contact:    Janae Bridgeman, RN Phone Number: 12/21/2021, 3:00 PM   Clinical Narrative: CAGE completed and patient given OP Counseling resources for Alcohol and substance abuse counseling.  Also placed resources follow up in discharge instructions.   CAGE-AID Screening:    Have You Ever Felt You Ought to Cut Down on Your Drinking or Drug Use?: Yes Have People Annoyed You By Critizing Your Drinking Or Drug Use?: Yes Have You Felt Bad Or Guilty About Your Drinking Or Drug Use?: Yes Have You Ever Had a Drink or Used Drugs First Thing In The Morning to Steady Your Nerves or to Get Rid of a Hangover?: Yes CAGE-AID Score: 4  Substance Abuse Education Offered: Yes  Substance abuse interventions: Patient Counseling, Transport planner

## 2021-12-21 NOTE — ED Notes (Signed)
Patient ambulatory to the bathroom.

## 2021-12-21 NOTE — Progress Notes (Signed)
PROGRESS NOTE    Kayce Chismar  JGG:[id number] DOB: 1975/09/23 DOA: 12/20/2021 PCP: Pcp, No   Brief Narrative:  HPI: Danielle Reilly is a 46 y.o. female with medical history significant of EtOH abuse.   Pt presents to ED with c/o abd pain, N/V.   She states that she was drinking heavily over the past week, last drink was 12/18/2021.  At that time, she developed upper abdominal pain that radiates to her lower back.  She has had frequent episodes of vomiting.  She has been unable to tolerate liquids or solids.  No fever, chest pain or shortness of breath.  She does report throat pain with swallowing.  No urinary symptoms reported although she has had dark urine.  Assessment & Plan:   Principal Problem:   Acute alcoholic pancreatitis Active Problems:   Increased anion gap metabolic acidosis   History of alcohol abuse  Acute alcoholic pancreatitis: Patient has been drinking a lot on daily basis, she confirms.  CT abdomen negative for pancreatitis.  Has elevated lipase and abdominal pain.  Still with abdominal pain and complaints of 10 out of 10 pain but appears comfortable.  Some nausea.  Will advance to clear liquid diet and continue symptomatic treatment.  Increased anion gap metabolic acidosis: Could very well be due to EtOH ketosis.  Improving.  Continue IV fluids.  Mild hyponatremia: Likely secondary to alcohol intake.  Monitor closely.  Hypokalemia: Replace.  Alcohol abuse: Currently no signs of withdrawal but continue CIWA protocol with as needed Ativan.  DVT prophylaxis: enoxaparin (LOVENOX) injection 40 mg Start: 12/21/21 1000   Code Status: Full Code  Family Communication:  None present at bedside.  Plan of care discussed with patient in length and he/she verbalized understanding and agreed with it.  Status is: Observation The patient will require care spanning > 2 midnights and should be moved to inpatient because: Still very symptomatic.  May need another 2 nights.   Estimated  body mass index is 25.62 kg/m as calculated from the following:   Height as of this encounter: 5\' 6"  (1.676 m).   Weight as of this encounter: 72 kg.    Nutritional Assessment: Body mass index is 25.62 kg/m. Seen by dietician.  I agree with the assessment and plan as outlined below: Nutrition Status:        . Skin Assessment: I have examined the patient's skin and I agree with the wound assessment as performed by the wound care RN as outlined below:    Consultants:  None  Procedures:  None  Antimicrobials:  Anti-infectives (From admission, onward)    None         Subjective: Patient seen and examined.  Still complains of 10 out of 10 abdominal pain.  Some nausea.  Willing to try clear liquid diet.  Objective: Vitals:   12/21/21 0645 12/21/21 0647 12/21/21 0700 12/21/21 0730  BP: 117/80  107/69 99/70  Pulse:   96 94  Resp: 20  20 17   Temp:  98.2 F (36.8 C)    TempSrc:  Oral    SpO2: 100%  100% 100%  Weight:      Height:        Intake/Output Summary (Last 24 hours) at 12/21/2021 0941 Last data filed at 12/21/2021 0107 Gross per 24 hour  Intake 2500 ml  Output --  Net 2500 ml   Filed Weights   12/20/21 1222  Weight: 72 kg    Examination:  General exam: Appears calm and comfortable  Respiratory system: Clear to auscultation. Respiratory effort normal. Cardiovascular system: S1 & S2 heard, RRR. No JVD, murmurs, rubs, gallops or clicks. No pedal edema. Gastrointestinal system: Abdomen is nondistended, soft and tender to palpation in the epigastric, right upper quadrant and left upper quadrant area.. No organomegaly or masses felt. Normal bowel sounds heard. Central nervous system: Alert and oriented. No focal neurological deficits. Extremities: Symmetric 5 x 5 power. Skin: No rashes, lesions or ulcers Psychiatry: Judgement and insight appear normal. Mood & affect appropriate.    Data Reviewed: I have personally reviewed following labs and imaging  studies  CBC: Recent Labs  Lab 12/20/21 1309 12/21/21 0303  WBC 9.9 9.8  NEUTROABS 7.8*  --   HGB 14.1 11.0*  HCT 41.5 31.2*  MCV 88.7 87.9  PLT 186 137*   Basic Metabolic Panel: Recent Labs  Lab 12/20/21 1309 12/21/21 0303  NA 133* 134*  K 3.5 3.1*  CL 99 104  CO2 16* 20*  GLUCOSE 116* 137*  BUN 11 9  CREATININE 1.29* 0.94  CALCIUM 10.4* 8.9  MG 2.3  --    GFR: Estimated Creatinine Clearance: 76.8 mL/min (by C-G formula based on SCr of 0.94 mg/dL). Liver Function Tests: Recent Labs  Lab 12/20/21 1309 12/21/21 0303  AST 292* 190*  ALT 173* 128*  ALKPHOS 89 66  BILITOT 2.2* 1.4*  PROT 8.8* 6.3*  ALBUMIN 4.9 3.6   Recent Labs  Lab 12/20/21 1309  LIPASE 166*   No results for input(s): "AMMONIA" in the last 168 hours. Coagulation Profile: Recent Labs  Lab 12/20/21 1309  INR 1.2   Cardiac Enzymes: No results for input(s): "CKTOTAL", "CKMB", "CKMBINDEX", "TROPONINI" in the last 168 hours. BNP (last 3 results) No results for input(s): "PROBNP" in the last 8760 hours. HbA1C: No results for input(s): "HGBA1C" in the last 72 hours. CBG: No results for input(s): "GLUCAP" in the last 168 hours. Lipid Profile: No results for input(s): "CHOL", "HDL", "LDLCALC", "TRIG", "CHOLHDL", "LDLDIRECT" in the last 72 hours. Thyroid Function Tests: No results for input(s): "TSH", "T4TOTAL", "FREET4", "T3FREE", "THYROIDAB" in the last 72 hours. Anemia Panel: No results for input(s): "VITAMINB12", "FOLATE", "FERRITIN", "TIBC", "IRON", "RETICCTPCT" in the last 72 hours. Sepsis Labs: Recent Labs  Lab 12/21/21 0303  LATICACIDVEN 1.0    No results found for this or any previous visit (from the past 240 hour(s)).   Radiology Studies: US Abdomen Complete  Result Date: 12/20/2021 CLINICAL DATA:  Acute pancreatitis. EXAM: ABDOMEN ULTRASOUND COMPLETE COMPARISON:  Ultrasound dated 04/06/2020. FINDINGS: Gallbladder: No gallstones or wall thickening visualized. No sonographic  Murphy sign noted by sonographer. Common bile duct: Diameter: 3 mm Liver: There is diffuse increased liver echogenicity most commonly seen in the setting of fatty infiltration. Superimposed inflammation or fibrosis is not excluded. Clinical correlation is recommended. Portal vein is patent on color Doppler imaging with normal direction of blood flow towards the liver. IVC: No abnormality visualized. Pancreas: Visualized portion unremarkable. Spleen: Size and appearance within normal limits. Right Kidney: Length: 10.7 cm. Normal echogenicity. No hydronephrosis or shadowing stone. Left Kidney: Length: 10.0 cm. Normal echogenicity. No hydronephrosis or shadowing stone. Abdominal aorta: No aneurysm visualized. Other findings: None. IMPRESSION: Fatty liver, otherwise unremarkable abdominal ultrasound. Electronically Signed   By: Elgie Collard M.D.   On: 12/20/2021 22:48    Scheduled Meds:  enoxaparin (LOVENOX) injection  40 mg Subcutaneous Q24H   folic acid  1 mg Oral Daily   multivitamin with minerals  1 tablet Oral Daily  potassium chloride  40 mEq Oral Q4H   thiamine  100 mg Oral Daily   Or   thiamine  100 mg Intravenous Daily   Continuous Infusions:  dextrose 5 % and 0.45% NaCl 125 mL/hr at 12/21/21 0932     LOS: 0 days   Hughie Closs, MD [facility]  12/21/2021, 9:41 AM   *Please note that this is a verbal dictation therefore any spelling or grammatical errors are due to the "Dragon Medical One" system interpretation.  Please page via Amion and do not message via secure chat for urgent patient care matters. Secure chat can be used for non urgent patient care matters.  How to contact the [facility] Attending or Consulting provider 7A - 7P or covering provider during after hours 7P -7A, for this patient?  Check the care team in [facility] and look for a) attending/consulting TRH provider listed and b) the [facility] team listed. Page or secure chat 7A-7P. Log into [email or web address] and use [place]'s  universal password to access. If you do not have the password, please contact the hospital operator. Locate the [facility] provider you are looking for under [facility] and page to a number that you can be directly reached. If you still have difficulty reaching the provider, please page the [facility] (Director on Call) for the Hospitalists listed on amion for assistance.

## 2021-12-22 LAB — COMPREHENSIVE METABOLIC PANEL
ALT: 131 U/L — ABNORMAL HIGH (ref 0–44)
AST: 193 U/L — ABNORMAL HIGH (ref 15–41)
Albumin: 3.1 g/dL — ABNORMAL LOW (ref 3.5–5.0)
Alkaline Phosphatase: 63 U/L (ref 38–126)
Anion gap: 8 (ref 5–15)
BUN: <5 mg/dL — ABNORMAL LOW (ref 6–20)
CO2: 23 mmol/L (ref 22–32)
Calcium: 8.7 mg/dL — ABNORMAL LOW (ref 8.9–10.3)
Chloride: 103 mmol/L (ref 98–111)
Creatinine, Ser: 0.71 mg/dL (ref 0.44–1.00)
GFR, Estimated: >60 mL/min (ref >60)
Glucose, Bld: 146 mg/dL — ABNORMAL HIGH (ref 70–99)
Potassium: 2.9 mmol/L — ABNORMAL LOW (ref 3.5–5.1)
Sodium: 134 mmol/L — ABNORMAL LOW (ref 135–145)
Total Bilirubin: 0.9 mg/dL (ref 0.3–1.2)
Total Protein: 5.7 g/dL — ABNORMAL LOW (ref 6.5–8.1)

## 2021-12-22 LAB — CBC WITH DIFFERENTIAL/PLATELET
Abs Immature Granulocytes: 0.07 10*3/uL (ref 0.00–0.07)
Basophils Absolute: 0 10*3/uL (ref 0.0–0.1)
Basophils Relative: 1 %
Eosinophils Absolute: 0 10*3/uL (ref 0.0–0.5)
Eosinophils Relative: 0 %
HCT: 30.5 % — ABNORMAL LOW (ref 36.0–46.0)
Hemoglobin: 10.8 g/dL — ABNORMAL LOW (ref 12.0–15.0)
Immature Granulocytes: 1 %
Lymphocytes Relative: 21 %
Lymphs Abs: 1.2 10*3/uL (ref 0.7–4.0)
MCH: 30.7 pg (ref 26.0–34.0)
MCHC: 35.4 g/dL (ref 30.0–36.0)
MCV: 86.6 fL (ref 80.0–100.0)
Monocytes Absolute: 0.5 10*3/uL (ref 0.1–1.0)
Monocytes Relative: 8 %
Neutro Abs: 4 10*3/uL (ref 1.7–7.7)
Neutrophils Relative %: 69 %
Platelets: 135 10*3/uL — ABNORMAL LOW (ref 150–400)
RBC: 3.52 MIL/uL — ABNORMAL LOW (ref 3.87–5.11)
RDW: 14.7 % (ref 11.5–15.5)
WBC: 5.8 10*3/uL (ref 4.0–10.5)
nRBC: 0.3 % — ABNORMAL HIGH (ref 0.0–0.2)

## 2021-12-22 LAB — MAGNESIUM: Magnesium: 2.1 mg/dL (ref 1.7–2.4)

## 2021-12-22 MED ORDER — POTASSIUM CHLORIDE CRYS ER 20 MEQ PO TBCR
40.0000 meq | EXTENDED_RELEASE_TABLET | ORAL | Status: AC
  Administered 2021-12-22 (×3): 40 meq via ORAL
  Filled 2021-12-22 (×3): qty 2

## 2021-12-22 NOTE — Plan of Care (Signed)

## 2021-12-22 NOTE — Progress Notes (Signed)
PROGRESS NOTE    Danielle Reilly  HQI:[id number] DOB: October 08, 1975 DOA: 12/20/2021 PCP: Pcp, No   Brief Narrative:  HPI: Danielle Reilly is a 46 y.o. female with medical history significant of EtOH abuse.   Pt presents to ED with c/o abd pain, N/V.   She states that she was drinking heavily over the past week, last drink was 12/18/2021.  At that time, she developed upper abdominal pain that radiates to her lower back.  She has had frequent episodes of vomiting.  She has been unable to tolerate liquids or solids.  No fever, chest pain or shortness of breath.  She does report throat pain with swallowing.  No urinary symptoms reported although she has had dark urine.  Assessment & Plan:   Principal Problem:   Acute alcoholic pancreatitis Active Problems:   Increased anion gap metabolic acidosis   History of alcohol abuse   Hypokalemia   Hyponatremia  Acute alcoholic pancreatitis: Patient has been drinking a lot on daily basis, she confirms.  CT abdomen negative for pancreatitis.  Has elevated lipase and abdominal pain.  Ultrasound negative for gallstones.  Patient was still complaining of 10 out of 10 however she appears very comfortable.  When we had more conversation, she was under the impression that she will be discharged today.  Once I told her that she is not going to be discharged, she told me that in reality her pain was only 7 out of 10.  She has no nausea.  We are going to advance her to full liquid diet now and then if tolerated, soft later.  Will stop IV fluids.  Increased anion gap metabolic acidosis: Could very well be due to EtOH ketosis.  Improved.  Mild hyponatremia: Likely secondary to alcohol intake.  Stable.  Monitor closely.  Hypokalemia: Low.  Will replace.  Alcohol abuse: Currently no signs of withdrawal but continue CIWA protocol with as needed Ativan.  DVT prophylaxis: enoxaparin (LOVENOX) injection 40 mg Start: 12/21/21 1000   Code Status: Full Code  Family Communication:   None present at bedside.  Plan of care discussed with patient in length and he/she verbalized understanding and agreed with it.  Status is: Inpatient Remains inpatient appropriate because: Slowly advancing diet.  Potential discharge tomorrow.     Estimated body mass index is 25.62 kg/m as calculated from the following:   Height as of this encounter: 5\' 6"  (1.676 m).   Weight as of this encounter: 72 kg.    Nutritional Assessment: Body mass index is 25.62 kg/m. Seen by dietician.  I agree with the assessment and plan as outlined below: Nutrition Status:        . Skin Assessment: I have examined the patient's skin and I agree with the wound assessment as performed by the wound care RN as outlined below:    Consultants:  None  Procedures:  None  Antimicrobials:  Anti-infectives (From admission, onward)    None         Subjective:  In and examined.  Still with abdominal pain but improving.  Tolerating diet.  Objective: Vitals:   12/21/21 1225 12/21/21 2338 12/22/21 0800 12/22/21 1130  BP: 117/84 115/85 109/73 116/71  Pulse: 98 89 (!) 105 (!) 105  Resp: 16 17  17   Temp: 97.8 F (36.6 C) 98.5 F (36.9 C)  99.5 F (37.5 C)  TempSrc: Oral Oral  Oral  SpO2: 100% 100%  100%  Weight:      Height:  Intake/Output Summary (Last 24 hours) at 12/22/2021 1139 Last data filed at 12/22/2021 0900 Gross per 24 hour  Intake 3673.85 ml  Output --  Net 3673.85 ml    Filed Weights   12/20/21 1222  Weight: 72 kg    Examination:  General exam: Appears calm and comfortable  Respiratory system: Clear to auscultation. Respiratory effort normal. Cardiovascular system: S1 & S2 heard, RRR. No JVD, murmurs, rubs, gallops or clicks. No pedal edema. Gastrointestinal system: Abdomen is nondistended, soft and moderate epigastric and right upper quadrant tenderness. No organomegaly or masses felt. Normal bowel sounds heard. Central nervous system: Alert and oriented. No  focal neurological deficits. Extremities: Symmetric 5 x 5 power. Skin: No rashes, lesions or ulcers.  Psychiatry: Judgement and insight appear normal. Mood & affect appropriate.   Data Reviewed: I have personally reviewed following labs and imaging studies  CBC: Recent Labs  Lab 12/20/21 1309 12/21/21 0303 12/22/21 0510  WBC 9.9 9.8 5.8  NEUTROABS 7.8*  --  4.0  HGB 14.1 11.0* 10.8*  HCT 41.5 31.2* 30.5*  MCV 88.7 87.9 86.6  PLT 186 137* 135*    Basic Metabolic Panel: Recent Labs  Lab 12/20/21 1309 12/21/21 0303 12/22/21 0510  NA 133* 134* 134*  K 3.5 3.1* 2.9*  CL 99 104 103  CO2 16* 20* 23  GLUCOSE 116* 137* 146*  BUN 11 9 <5*  CREATININE 1.29* 0.94 0.71  CALCIUM 10.4* 8.9 8.7*  MG 2.3  --   --     GFR: Estimated Creatinine Clearance: 90.3 mL/min (by C-G formula based on SCr of 0.71 mg/dL). Liver Function Tests: Recent Labs  Lab 12/20/21 1309 12/21/21 0303 12/22/21 0510  AST 292* 190* 193*  ALT 173* 128* 131*  ALKPHOS 89 66 63  BILITOT 2.2* 1.4* 0.9  PROT 8.8* 6.3* 5.7*  ALBUMIN 4.9 3.6 3.1*    Recent Labs  Lab 12/20/21 1309  LIPASE 166*    No results for input(s): "AMMONIA" in the last 168 hours. Coagulation Profile: Recent Labs  Lab 12/20/21 1309  INR 1.2    Cardiac Enzymes: No results for input(s): "CKTOTAL", "CKMB", "CKMBINDEX", "TROPONINI" in the last 168 hours. BNP (last 3 results) No results for input(s): "PROBNP" in the last 8760 hours. HbA1C: No results for input(s): "HGBA1C" in the last 72 hours. CBG: No results for input(s): "GLUCAP" in the last 168 hours. Lipid Profile: No results for input(s): "CHOL", "HDL", "LDLCALC", "TRIG", "CHOLHDL", "LDLDIRECT" in the last 72 hours. Thyroid Function Tests: No results for input(s): "TSH", "T4TOTAL", "FREET4", "T3FREE", "THYROIDAB" in the last 72 hours. Anemia Panel: No results for input(s): "VITAMINB12", "FOLATE", "FERRITIN", "TIBC", "IRON", "RETICCTPCT" in the last 72 hours. Sepsis  Labs: Recent Labs  Lab 12/21/21 0303  LATICACIDVEN 1.0     No results found for this or any previous visit (from the past 240 hour(s)).   Radiology Studies: US Abdomen Complete  Result Date: 12/20/2021 CLINICAL DATA:  Acute pancreatitis. EXAM: ABDOMEN ULTRASOUND COMPLETE COMPARISON:  Ultrasound dated 04/06/2020. FINDINGS: Gallbladder: No gallstones or wall thickening visualized. No sonographic Murphy sign noted by sonographer. Common bile duct: Diameter: 3 mm Liver: There is diffuse increased liver echogenicity most commonly seen in the setting of fatty infiltration. Superimposed inflammation or fibrosis is not excluded. Clinical correlation is recommended. Portal vein is patent on color Doppler imaging with normal direction of blood flow towards the liver. IVC: No abnormality visualized. Pancreas: Visualized portion unremarkable. Spleen: Size and appearance within normal limits. Right Kidney: Length: 10.7 cm.  Normal echogenicity. No hydronephrosis or shadowing stone. Left Kidney: Length: 10.0 cm. Normal echogenicity. No hydronephrosis or shadowing stone. Abdominal aorta: No aneurysm visualized. Other findings: None. IMPRESSION: Fatty liver, otherwise unremarkable abdominal ultrasound. Electronically Signed   By: Elgie Collard M.D.   On: 12/20/2021 22:48    Scheduled Meds:  enoxaparin (LOVENOX) injection  40 mg Subcutaneous Q24H   folic acid  1 mg Oral Daily   multivitamin with minerals  1 tablet Oral Daily   potassium chloride  40 mEq Oral Q4H   thiamine  100 mg Oral Daily   Continuous Infusions:  dextrose 5 % and 0.45% NaCl 125 mL/hr at 12/22/21 0329     LOS: 1 day   Hughie Closs, MD [facility]  12/22/2021, 11:39 AM   *Please note that this is a verbal dictation therefore any spelling or grammatical errors are due to the "Dragon Medical One" system interpretation.  Please page via Amion and do not message via secure chat for urgent patient care matters. Secure chat can be  used for non urgent patient care matters.  How to contact the [facility] Attending or Consulting provider 7A - 7P or covering provider during after hours 7P -7A, for this patient?  Check the care team in [facility] and look for a) attending/consulting TRH provider listed and b) the [facility] team listed. Page or secure chat 7A-7P. Log into [email or web address] and use 's universal password to access. If you do not have the password, please contact the hospital operator. Locate the [facility] provider you are looking for under [facility] and page to a number that you can be directly reached. If you still have difficulty reaching the provider, please page the [facility] (Director on Call) for the Hospitalists listed on amion for assistance.

## 2021-12-23 LAB — BASIC METABOLIC PANEL
Anion gap: 8 (ref 5–15)
BUN: <5 mg/dL — ABNORMAL LOW (ref 6–20)
CO2: 22 mmol/L (ref 22–32)
Calcium: 9 mg/dL (ref 8.9–10.3)
Chloride: 106 mmol/L (ref 98–111)
Creatinine, Ser: 0.69 mg/dL (ref 0.44–1.00)
GFR, Estimated: >60 mL/min (ref >60)
Glucose, Bld: 160 mg/dL — ABNORMAL HIGH (ref 70–99)
Potassium: 3.7 mmol/L (ref 3.5–5.1)
Sodium: 136 mmol/L (ref 135–145)

## 2021-12-23 NOTE — Progress Notes (Signed)
PROGRESS NOTE    Danielle Reilly  VEH:[id number] DOB: 12/21/1975 DOA: 12/20/2021 PCP: Pcp, No   Brief Narrative:  HPI: Danielle Reilly is a 46 y.o. female with medical history significant of EtOH abuse.   Pt presents to ED with c/o abd pain, N/V.   She states that she was drinking heavily over the past week, last drink was 12/18/2021.  At that time, she developed upper abdominal pain that radiates to her lower back.  She has had frequent episodes of vomiting.  She has been unable to tolerate liquids or solids.  No fever, chest pain or shortness of breath.  She does report throat pain with swallowing.  No urinary symptoms reported although she has had dark urine.  Assessment & Plan:   Principal Problem:   Acute alcoholic pancreatitis Active Problems:   Increased anion gap metabolic acidosis   History of alcohol abuse   Hypokalemia   Hyponatremia  Acute alcoholic pancreatitis: Patient has been drinking a lot on daily basis, she confirms.  CT abdomen negative for pancreatitis.  Has elevated lipase and abdominal pain.  Ultrasound negative for gallstones.  She still complains of 9 out of 10 pain but she continues to appear very comfortable.  In my opinion, her pain is likely 4-5.  She is just afraid of going home due to potential of worsening pain and she is also afraid that she is so much craving for alcohol that she would resume drinking and she does not want to do that and here she is in controlled environment.  Per her request, we will keep her 1 more day.  Increased anion gap metabolic acidosis: Could very well be due to EtOH ketosis.  Improved.  Mild hyponatremia: Likely secondary to alcohol intake.  Stable.  Monitor closely.  Hypokalemia: Resolved.  Alcohol abuse: Currently no signs of withdrawal but continue CIWA protocol with as needed Ativan.  Counseling provided to quit.  DVT prophylaxis: enoxaparin (LOVENOX) injection 40 mg Start: 12/21/21 1000   Code Status: Full Code  Family  Communication:  None present at bedside.  Plan of care discussed with patient in length and he/she verbalized understanding and agreed with it.  Status is: Inpatient Remains inpatient appropriate because: Slowly advancing diet.  Potential discharge tomorrow.  Estimated body mass index is 25.62 kg/m as calculated from the following:   Height as of this encounter: 5\' 6"  (1.676 m).   Weight as of this encounter: 72 kg.    Nutritional Assessment: Body mass index is 25.62 kg/m. Seen by dietician.  I agree with the assessment and plan as outlined below: Nutrition Status:        . Skin Assessment: I have examined the patient's skin and I agree with the wound assessment as performed by the wound care RN as outlined below:    Consultants:  None  Procedures:  None  Antimicrobials:  Anti-infectives (From admission, onward)    None         Subjective:  Patient seen and examined.  Still complains of abdominal pain but no nausea or vomiting and she is tolerating diet.  Objective: Vitals:   12/22/21 2006 12/22/21 2300 12/23/21 0304 12/23/21 0730  BP: 113/69 119/69 (!) 113/90 114/76  Pulse: (!) 105 97 95 (!) 102  Resp: 16  15 16   Temp: 98.3 F (36.8 C)  98.3 F (36.8 C) 97.9 F (36.6 C)  TempSrc: Oral  Oral Oral  SpO2: 100%  100% 100%  Weight:      Height:  Intake/Output Summary (Last 24 hours) at 12/23/2021 1023 Last data filed at 12/22/2021 1700 Gross per 24 hour  Intake 480 ml  Output --  Net 480 ml    Filed Weights   12/20/21 1222  Weight: 72 kg    Examination:  General exam: Appears calm and comfortable  Respiratory system: Clear to auscultation. Respiratory effort normal. Cardiovascular system: S1 & S2 heard, RRR. No JVD, murmurs, rubs, gallops or clicks. No pedal edema. Gastrointestinal system: Abdomen is nondistended, soft and mild epigastric tenderness. No organomegaly or masses felt. Normal bowel sounds heard. Central nervous system: Alert  and oriented. No focal neurological deficits. Extremities: Symmetric 5 x 5 power. Skin: No rashes, lesions or ulcers.  Psychiatry: Judgement and insight appear normal. Mood & affect appropriate.    Data Reviewed: I have personally reviewed following labs and imaging studies  CBC: Recent Labs  Lab 12/20/21 1309 12/21/21 0303 12/22/21 0510  WBC 9.9 9.8 5.8  NEUTROABS 7.8*  --  4.0  HGB 14.1 11.0* 10.8*  HCT 41.5 31.2* 30.5*  MCV 88.7 87.9 86.6  PLT 186 137* 135*    Basic Metabolic Panel: Recent Labs  Lab 12/20/21 1309 12/21/21 0303 12/22/21 0510 12/23/21 0731  NA 133* 134* 134* 136  K 3.5 3.1* 2.9* 3.7  CL 99 104 103 106  CO2 16* 20* 23 22  GLUCOSE 116* 137* 146* 160*  BUN 11 9 <5* <5*  CREATININE 1.29* 0.94 0.71 0.69  CALCIUM 10.4* 8.9 8.7* 9.0  MG 2.3  --  2.1  --     GFR: Estimated Creatinine Clearance: 90.3 mL/min (by C-G formula based on SCr of 0.69 mg/dL). Liver Function Tests: Recent Labs  Lab 12/20/21 1309 12/21/21 0303 12/22/21 0510  AST 292* 190* 193*  ALT 173* 128* 131*  ALKPHOS 89 66 63  BILITOT 2.2* 1.4* 0.9  PROT 8.8* 6.3* 5.7*  ALBUMIN 4.9 3.6 3.1*    Recent Labs  Lab 12/20/21 1309  LIPASE 166*    No results for input(s): "AMMONIA" in the last 168 hours. Coagulation Profile: Recent Labs  Lab 12/20/21 1309  INR 1.2    Cardiac Enzymes: No results for input(s): "CKTOTAL", "CKMB", "CKMBINDEX", "TROPONINI" in the last 168 hours. BNP (last 3 results) No results for input(s): "PROBNP" in the last 8760 hours. HbA1C: No results for input(s): "HGBA1C" in the last 72 hours. CBG: No results for input(s): "GLUCAP" in the last 168 hours. Lipid Profile: No results for input(s): "CHOL", "HDL", "LDLCALC", "TRIG", "CHOLHDL", "LDLDIRECT" in the last 72 hours. Thyroid Function Tests: No results for input(s): "TSH", "T4TOTAL", "FREET4", "T3FREE", "THYROIDAB" in the last 72 hours. Anemia Panel: No results for input(s): "VITAMINB12", "FOLATE",  "FERRITIN", "TIBC", "IRON", "RETICCTPCT" in the last 72 hours. Sepsis Labs: Recent Labs  Lab 12/21/21 0303  LATICACIDVEN 1.0     No results found for this or any previous visit (from the past 240 hour(s)).   Radiology Studies: No results found.  Scheduled Meds:  enoxaparin (LOVENOX) injection  40 mg Subcutaneous Q24H   folic acid  1 mg Oral Daily   multivitamin with minerals  1 tablet Oral Daily   thiamine  100 mg Oral Daily   Continuous Infusions:  dextrose 5 % and 0.45% NaCl 125 mL/hr at 12/23/21 0939     LOS: 2 days   Hughie Closs, MD [facility]  12/23/2021, 10:23 AM   *Please note that this is a verbal dictation therefore any spelling or grammatical errors are due to the "[employer]  One" system interpretation.  Please page via Amion and do not message via secure chat for urgent patient care matters. Secure chat can be used for non urgent patient care matters.  How to contact the [facility] Attending or Consulting provider 7A - 7P or covering provider during after hours 7P -7A, for this patient?  Check the care team in [facility] and look for a) attending/consulting TR[facility] provider listed and b) the [facility] team listed. Page or secure chat 7A-7P. Log into [email or web address] and use [place]'s universal password to access. If you do not have the password, please contact the hospital operator. Locate the [facility] provider you are looking for under [facility] and page to a number that you can be directly reached. If you still have difficulty reaching the provider, please page the [facility] (Director on Call) for the Hospitalists listed on amion for assistance.

## 2021-12-23 NOTE — Progress Notes (Addendum)
Slept quite well this shift. Required pain meds once this shift. No anxiety symptom noted. VS stable. IV fluids therapy ongoing. Did not require any intervention based on CIWA score. Line secured earlier. No infiltration noted. Safety maintained.

## 2021-12-24 DIAGNOSIS — E871 Hypo-osmolality and hyponatremia: Secondary | ICD-10-CM

## 2021-12-24 DIAGNOSIS — E876 Hypokalemia: Secondary | ICD-10-CM

## 2021-12-24 MED ORDER — ONDANSETRON 8 MG PO TBDP: 8.0000 mg | ORAL_TABLET | Freq: Three times a day (TID) | ORAL | 0 refills | Status: DC | PRN

## 2021-12-24 MED ORDER — VITAMIN B-1 100 MG PO TABS: 100.0000 mg | ORAL_TABLET | Freq: Every day | ORAL | 1 refills | Status: DC

## 2021-12-24 MED ORDER — FOLIC ACID 1 MG PO TABS: 1.0000 mg | ORAL_TABLET | Freq: Every day | ORAL | 1 refills | Status: DC

## 2021-12-24 MED ORDER — OMEPRAZOLE 40 MG PO CPDR: 40.0000 mg | DELAYED_RELEASE_CAPSULE | Freq: Two times a day (BID) | ORAL | 3 refills | Status: DC

## 2021-12-24 MED ORDER — ACETAMINOPHEN 500 MG PO TABS: 1000.0000 mg | ORAL_TABLET | Freq: Three times a day (TID) | ORAL | Status: AC | PRN

## 2021-12-24 MED ORDER — OXYCODONE HCL 5 MG PO CAPS: 5.0000 mg | ORAL_CAPSULE | Freq: Four times a day (QID) | ORAL | 0 refills | Status: DC | PRN

## 2021-12-24 NOTE — Assessment & Plan Note (Signed)
-   In the setting of dehydration from GI losses and alcohol abuse. -After fluid resuscitation electrolytes within normal limits at discharge. -Repeat basic metabolic panel to follow electrolytes trend/stability.

## 2021-12-24 NOTE — Assessment & Plan Note (Signed)
-   Driven by alcoholic pancreatitis and probably associated alcoholic gastritis. -Patient has been discharged on PPI twice a day -Instructed to maintain adequate hydration. -Continue as needed antiemetic. -No further vomiting events reported at discharge.

## 2021-12-24 NOTE — Plan of Care (Signed)

## 2021-12-24 NOTE — Assessment & Plan Note (Signed)
-   In the setting of GI losses and alcohol abuse normal limits at discharge -Repeat basic metabolic panel to follow electrolytes trend/stability.

## 2021-12-24 NOTE — Discharge Summary (Signed)
Physician Discharge Summary   Patient: Danielle Reilly MRN: [id number] DOB: 1976/01/30  Admit date:     12/20/2021  Discharge date: 12/24/21  Discharge Physician: Vassie Loll   PCP: Pcp, No   Recommendations at discharge:  Repeat complete metabolic panel to follow electrolytes, renal function and LFTs. Continue assisting patient with alcohol cessation. Repeat CBG/A1c due to hyperglycemic changes appreciated while hospitalized. Please initiate treatment with diabetes regimen if required.  Discharge Diagnoses: Principal Problem:   Acute alcoholic pancreatitis Active Problems:   Increased anion gap metabolic acidosis   History of alcohol abuse   Hypokalemia   Nausea and vomiting   Hyponatremia Hyperglycemia   Brief Hospital admission Course: As per H&P written by Dr. Julian Reil on 12/20/21 Danielle Reilly is a 46 y.o. female with medical history significant of EtOH abuse.   Pt presents to ED with c/o abd pain, N/V.   She states that she was drinking heavily over the past week, last drink was 12/18/2021.  At that time, she developed upper abdominal pain that radiates to her lower back.  She has had frequent episodes of vomiting.  She has been unable to tolerate liquids or solids.  No fever, chest pain or shortness of breath.  She does report throat pain with swallowing.  No urinary symptoms reported although she has had dark urine.   Assessment and Plan: * Acute alcoholic pancreatitis -Elevated lipase and presentation most suspicious for EtOH pancreatitis.   -No gallstones or biliary abnormalities appreciated on ultrasound. -Excellent response to bowel rest, fluid resuscitation, analgesics and antiemetics. -At time of discharge patient able to tolerate diet, improvement appreciated on her lipase level and no further episodes vomiting reported. -Alcohol cessation and complete abstinence recommended. -Patient advised to maintain adequate hydration and to continue using as needed pain medications and  antiemetics. -Low-fat diet encouraged.  Increased anion gap metabolic acidosis -In the setting of alcohol ketosis. -Improved/resolved with fluid resuscitation -Continue to follow electrolytes.   History of alcohol abuse -CIWA protocol initiated while inpatient; no withdrawals appreciated. -Cessation counseling provided -Patient instructed to take thiamine and folic acid on a daily basis.   -She is motivated to quit.  Hyponatremia - In the setting of dehydration from GI losses and alcohol abuse. -After fluid resuscitation electrolytes within normal limits at discharge. -Repeat basic metabolic panel to follow electrolytes trend/stability.  Nausea and vomiting - Driven by alcoholic pancreatitis and probably associated alcoholic gastritis. -Patient has been discharged on PPI twice a day -Instructed to maintain adequate hydration. -Continue as needed antiemetic. -No further vomiting events reported at discharge.  Hypokalemia - In the setting of GI losses and alcohol abuse normal limits at discharge -Repeat basic metabolic panel to follow electrolytes trend/stability.  Hyperglycemia -Most likely driven by acute pancreatitis process -No prior history of diabetes -Patient advised to watch carbohydrates in her diet and to maintain adequate hydration -Recommended repeat CBC/A1c as an outpatient with initiation of hypoglycemic regimen if required.  Consultants: None Procedures performed: See below for actual report. Disposition: Home Diet recommendation: Low-fat diet.  DISCHARGE MEDICATION: Allergies as of 12/24/2021       Reactions   Fluconazole Hives   Hydrocodone Nausea And Vomiting        Medication List     STOP taking these medications    chlordiazePOXIDE 25 MG capsule Commonly known as: LIBRIUM   sucralfate 1 GM/10ML suspension Commonly known as: Carafate       TAKE these medications    acetaminophen 500 MG tablet Commonly known  as: TYLENOL Take 2  tablets (1,000 mg total) by mouth every 8 (eight) hours as needed for mild pain, fever or headache.   b complex vitamins capsule Take 1 capsule by mouth daily.   fluticasone 50 MCG/ACT nasal spray Commonly known as: FLONASE Place 1 spray into both nostrils daily as needed for allergies or rhinitis.   folic acid 1 MG tablet Commonly known as: FOLVITE Take 1 tablet (1 mg total) by mouth daily. Start taking on: December 25, 2021   MULTIVITAMINS PO Take 1 tablet by mouth daily.   omeprazole 40 MG capsule Commonly known as: PRILOSEC Take 1 capsule (40 mg total) by mouth in the morning and at bedtime. What changed: when to take this   ondansetron 8 MG disintegrating tablet Commonly known as: ZOFRAN-ODT Take 1 tablet (8 mg total) by mouth every 8 (eight) hours as needed for nausea or vomiting. What changed:  medication strength how much to take   oxycodone 5 MG capsule Commonly known as: OXY-IR Take 1 capsule (5 mg total) by mouth every 6 (six) hours as needed (sever pain).   thiamine 100 MG tablet Commonly known as: Vitamin B-1 Take 1 tablet (100 mg total) by mouth daily. Start taking on: December 25, 2021        Follow-up Information     Grayce Sessions, NP Follow up on 01/04/2022.   Specialty: Internal Medicine Why: You are scheduled for a post-hospitalization clinic visit for January 04, 2022 at 3:50 pm. Contact information: [address] [place] [address] [phone]                Discharge Exam: Filed Weights   12/20/21 1222  Weight: 72 kg   General exam: Alert, awake, oriented x 3 Respiratory system: Clear to auscultation. Respiratory effort normal. Cardiovascular system:RRR. No murmurs, rubs, gallops. Gastrointestinal system: Abdomen is nondistended, soft and nontender. No organomegaly or masses felt. Normal bowel sounds heard. Central nervous system: Alert and oriented. No focal neurological deficits. Extremities: No C/C/E,  +pedal pulses Skin: No rashes, lesions or ulcers Psychiatry: Judgement and insight appear normal. Mood & affect appropriate.    Condition at discharge: Stable and improved.  The results of significant diagnostics from this hospitalization (including imaging, microbiology, ancillary and laboratory) are listed below for reference.   Imaging Studies: US Abdomen Complete  Result Date: 12/20/2021 CLINICAL DATA:  Acute pancreatitis. EXAM: ABDOMEN ULTRASOUND COMPLETE COMPARISON:  Ultrasound dated 04/06/2020. FINDINGS: Gallbladder: No gallstones or wall thickening visualized. No sonographic Murphy sign noted by sonographer. Common bile duct: Diameter: 3 mm Liver: There is diffuse increased liver echogenicity most commonly seen in the setting of fatty infiltration. Superimposed inflammation or fibrosis is not excluded. Clinical correlation is recommended. Portal vein is patent on color Doppler imaging with normal direction of blood flow towards the liver. IVC: No abnormality visualized. Pancreas: Visualized portion unremarkable. Spleen: Size and appearance within normal limits. Right Kidney: Length: 10.7 cm. Normal echogenicity. No hydronephrosis or shadowing stone. Left Kidney: Length: 10.0 cm. Normal echogenicity. No hydronephrosis or shadowing stone. Abdominal aorta: No aneurysm visualized. Other findings: None. IMPRESSION: Fatty liver, otherwise unremarkable abdominal ultrasound. Electronically Signed   By: Elgie Collard M.D.   On: 12/20/2021 22:48    Microbiology: Results for orders placed or performed during the hospital encounter of 01/08/20  Respiratory Panel by RT PCR (Flu A&B, Covid) - Nasopharyngeal Swab     Status: None   Collection Time: 01/08/20  9:18 PM   Specimen: Nasopharyngeal Swab  Result Value Ref Range Status   SARS Coronavirus 2 by RT PCR NEGATIVE NEGATIVE Final    Comment: (NOTE) SARS-CoV-2 target nucleic acids are NOT DETECTED.  The SARS-CoV-2 RNA is generally detectable in  upper respiratoy specimens during the acute phase of infection. The lowest concentration of SARS-CoV-2 viral copies this assay can detect is 131 copies/mL. A negative result does not preclude SARS-Cov-2 infection and should not be used as the sole basis for treatment or other patient management decisions. A negative result may occur with  improper specimen collection/handling, submission of specimen other than nasopharyngeal swab, presence of viral mutation(s) within the areas targeted by this assay, and inadequate number of viral copies (<131 copies/mL). A negative result must be combined with clinical observations, patient history, and epidemiological information. The expected result is Negative.  Fact Sheet for Patients:  https://www.moore.com/  Fact Sheet for Healthcare Providers:  https://www.young.biz/  This test is no t yet approved or cleared by the [place] FDA and  has been authorized for detection and/or diagnosis of SARS-CoV-2 by FDA under an Emergency Use Authorization (EUA). This EUA will remain  in effect (meaning this test can be used) for the duration of the COVID-19 declaration under Section 564(b)(1) of the Act, 21 U.S.C. section 360bbb-3(b)(1), unless the authorization is terminated or revoked sooner.     Influenza A by PCR NEGATIVE NEGATIVE Final   Influenza B by PCR NEGATIVE NEGATIVE Final    Comment: (NOTE) The Xpert Xpress SARS-CoV-2/FLU/RSV assay is intended as an aid in  the diagnosis of influenza from Nasopharyngeal swab specimens and  should not be used as a sole basis for treatment. Nasal washings and  aspirates are unacceptable for Xpert Xpress SARS-CoV-2/FLU/RSV  testing.  Fact Sheet for Patients: https://www.moore.com/  Fact Sheet for Healthcare Providers: https://www.young.biz/  This test is not yet approved or cleared by the [place] FDA and  has been  authorized for detection and/or diagnosis of SARS-CoV-2 by  FDA under an Emergency Use Authorization (EUA). This EUA will remain  in effect (meaning this test can be used) for the duration of the  Covid-19 declaration under Section 564(b)(1) of the Act, 21  U.S.C. section 360bbb-3(b)(1), unless the authorization is  terminated or revoked. Performed at [facility], [address] [place], [place] [address]     Labs: CBC: Recent Labs  Lab 12/20/21 1309 12/21/21 0303 12/22/21 0510  WBC 9.9 9.8 5.8  NEUTROABS 7.8*  --  4.0  HGB 14.1 11.0* 10.8*  HCT 41.5 31.2* 30.5*  MCV 88.7 87.9 86.6  PLT 186 137* 135*   Basic Metabolic Panel: Recent Labs  Lab 12/20/21 1309 12/21/21 0303 12/22/21 0510 12/23/21 0731  NA 133* 134* 134* 136  K 3.5 3.1* 2.9* 3.7  CL 99 104 103 106  CO2 16* 20* 23 22  GLUCOSE 116* 137* 146* 160*  BUN 11 9 <5* <5*  CREATININE 1.29* 0.94 0.71 0.69  CALCIUM 10.4* 8.9 8.7* 9.0  MG 2.3  --  2.1  --    Liver Function Tests: Recent Labs  Lab 12/20/21 1309 12/21/21 0303 12/22/21 0510  AST 292* 190* 193*  ALT 173* 128* 131*  ALKPHOS 89 66 63  BILITOT 2.2* 1.4* 0.9  PROT 8.8* 6.3* 5.7*  ALBUMIN 4.9 3.6 3.1*   CBG: No results for input(s): "GLUCAP" in the last 168 hours.  Discharge time spent: greater than 30 minutes.  Signed: Vassie Loll, MD [facility] 12/24/2021

## 2022-01-04 ENCOUNTER — Inpatient Hospital Stay (INDEPENDENT_AMBULATORY_CARE_PROVIDER_SITE_OTHER): Payer: Self-pay | Admitting: Primary Care

## 2022-05-30 ENCOUNTER — Encounter: Payer: Medicaid Other | Admitting: Emergency Medicine

## 2022-06-14 ENCOUNTER — Emergency Department (HOSPITAL_COMMUNITY)
Admission: EM | Admit: 2022-06-14 | Discharge: 2022-06-14 | Disposition: A | Discharge status: Home / Self Care | Payer: Medicaid Other | Attending: Emergency Medicine | Admitting: Emergency Medicine

## 2022-06-14 ENCOUNTER — Encounter (HOSPITAL_COMMUNITY): Payer: Self-pay

## 2022-06-14 ENCOUNTER — Other Ambulatory Visit: Payer: Self-pay

## 2022-06-14 DIAGNOSIS — R109 Unspecified abdominal pain: Secondary | ICD-10-CM | POA: Diagnosis present

## 2022-06-14 DIAGNOSIS — R112 Nausea with vomiting, unspecified: Secondary | ICD-10-CM

## 2022-06-14 DIAGNOSIS — K2920 Alcoholic gastritis without bleeding: Secondary | ICD-10-CM

## 2022-06-14 LAB — COMPREHENSIVE METABOLIC PANEL
ALT: 80 U/L — ABNORMAL HIGH (ref 0–44)
AST: 162 U/L — ABNORMAL HIGH (ref 15–41)
Albumin: 4.3 g/dL (ref 3.5–5.0)
Alkaline Phosphatase: 87 U/L (ref 38–126)
Anion gap: 19 — ABNORMAL HIGH (ref 5–15)
BUN: <5 mg/dL — ABNORMAL LOW (ref 6–20)
CO2: 23 mmol/L (ref 22–32)
Calcium: 9.3 mg/dL (ref 8.9–10.3)
Chloride: 92 mmol/L — ABNORMAL LOW (ref 98–111)
Creatinine, Ser: 0.96 mg/dL (ref 0.44–1.00)
GFR, Estimated: >60 mL/min (ref >60)
Glucose, Bld: 123 mg/dL — ABNORMAL HIGH (ref 70–99)
Potassium: 3.4 mmol/L — ABNORMAL LOW (ref 3.5–5.1)
Sodium: 134 mmol/L — ABNORMAL LOW (ref 135–145)
Total Bilirubin: 1.6 mg/dL — ABNORMAL HIGH (ref 0.3–1.2)
Total Protein: 7.9 g/dL (ref 6.5–8.1)

## 2022-06-14 LAB — CBC
HCT: 37.6 % (ref 36.0–46.0)
Hemoglobin: 12.7 g/dL (ref 12.0–15.0)
MCH: 29.7 pg (ref 26.0–34.0)
MCHC: 33.8 g/dL (ref 30.0–36.0)
MCV: 88.1 fL (ref 80.0–100.0)
Platelets: 218 10*3/uL (ref 150–400)
RBC: 4.27 MIL/uL (ref 3.87–5.11)
RDW: 14.6 % (ref 11.5–15.5)
WBC: 9.4 10*3/uL (ref 4.0–10.5)
nRBC: 0.2 % (ref 0.0–0.2)

## 2022-06-14 LAB — URINALYSIS, ROUTINE W REFLEX MICROSCOPIC
Glucose, UA: 100 mg/dL — AB
Ketones, ur: 40 mg/dL — AB
Leukocytes,Ua: NEGATIVE
Nitrite: POSITIVE — AB
Protein, ur: 100 mg/dL — AB
Specific Gravity, Urine: >1.03 — ABNORMAL HIGH (ref 1.005–1.030)
pH: 6 (ref 5.0–8.0)

## 2022-06-14 LAB — RAPID URINE DRUG SCREEN, HOSP PERFORMED
Amphetamines: NOT DETECTED
Barbiturates: NOT DETECTED
Benzodiazepines: NOT DETECTED
Cocaine: NOT DETECTED
Opiates: NOT DETECTED
Tetrahydrocannabinol: NOT DETECTED

## 2022-06-14 LAB — ETHANOL: Alcohol, Ethyl (B): <10 mg/dL (ref <10)

## 2022-06-14 LAB — POTASSIUM: Potassium: 3.4 mmol/L — ABNORMAL LOW (ref 3.5–5.1)

## 2022-06-14 LAB — I-STAT BETA HCG BLOOD, ED (MC, WL, AP ONLY): I-stat hCG, quantitative: <5 m[IU]/mL (ref <5)

## 2022-06-14 LAB — URINALYSIS, MICROSCOPIC (REFLEX)

## 2022-06-14 LAB — LIPASE, BLOOD: Lipase: 33 U/L (ref 11–51)

## 2022-06-14 MED ORDER — SODIUM CHLORIDE 0.9 % IV BOLUS
1000.0000 mL | Freq: Once | INTRAVENOUS | Status: AC
  Administered 2022-06-14: 1000 mL via INTRAVENOUS

## 2022-06-14 MED ORDER — OMEPRAZOLE 40 MG PO CPDR
40.0000 mg | DELAYED_RELEASE_CAPSULE | Freq: Two times a day (BID) | ORAL | 0 refills | Status: DC
Start: 2022-06-14 — End: 2022-07-24

## 2022-06-14 MED ORDER — ONDANSETRON 8 MG PO TBDP: 8.0000 mg | ORAL_TABLET | Freq: Three times a day (TID) | ORAL | 0 refills | Status: DC | PRN

## 2022-06-14 MED ORDER — CHLORDIAZEPOXIDE HCL 25 MG PO CAPS: ORAL_CAPSULE | ORAL | 0 refills | Status: DC

## 2022-06-14 MED ORDER — METOCLOPRAMIDE HCL 5 MG/ML IJ SOLN
10.0000 mg | Freq: Once | INTRAMUSCULAR | Status: AC
  Administered 2022-06-14: 10 mg via INTRAVENOUS
  Filled 2022-06-14: qty 2

## 2022-06-14 MED ORDER — LORAZEPAM 2 MG/ML IJ SOLN
1.0000 mg | Freq: Once | INTRAMUSCULAR | Status: AC
Start: 2022-06-14 — End: 2022-06-14
  Administered 2022-06-14: 1 mg via INTRAVENOUS
  Filled 2022-06-14: qty 1

## 2022-06-14 MED ORDER — ALUM & MAG HYDROXIDE-SIMETH 200-200-20 MG/5ML PO SUSP
30.0000 mL | Freq: Once | ORAL | Status: AC
  Administered 2022-06-14: 30 mL via ORAL
  Filled 2022-06-14: qty 30

## 2022-06-14 MED ORDER — LORAZEPAM 2 MG/ML IJ SOLN
1.0000 mg | Freq: Once | INTRAMUSCULAR | Status: AC
  Administered 2022-06-14: 1 mg via INTRAVENOUS
  Filled 2022-06-14: qty 1

## 2022-06-14 MED ORDER — POTASSIUM CHLORIDE CRYS ER 20 MEQ PO TBCR
40.0000 meq | EXTENDED_RELEASE_TABLET | Freq: Once | ORAL | Status: DC
  Filled 2022-06-14: qty 2

## 2022-06-14 MED ORDER — HYDROMORPHONE HCL 1 MG/ML IJ SOLN
0.5000 mg | Freq: Once | INTRAMUSCULAR | Status: AC
  Administered 2022-06-14: 0.5 mg via INTRAVENOUS
  Filled 2022-06-14: qty 1

## 2022-06-14 MED ORDER — PANTOPRAZOLE SODIUM 40 MG IV SOLR
40.0000 mg | Freq: Once | INTRAVENOUS | Status: AC
  Administered 2022-06-14: 40 mg via INTRAVENOUS
  Filled 2022-06-14: qty 10

## 2022-06-14 MED ORDER — LIDOCAINE VISCOUS HCL 2 % MT SOLN: 15.0000 mL | Freq: Once | OROMUCOSAL | Status: DC

## 2022-06-14 MED ORDER — ONDANSETRON HCL 4 MG/2ML IJ SOLN
4.0000 mg | Freq: Once | INTRAMUSCULAR | Status: AC
  Administered 2022-06-14: 4 mg via INTRAVENOUS
  Filled 2022-06-14: qty 2

## 2022-06-14 MED ORDER — ALUM & MAG HYDROXIDE-SIMETH 200-200-20 MG/5ML PO SUSP: 30.0000 mL | Freq: Once | ORAL | Status: DC

## 2022-06-14 MED ORDER — LIDOCAINE VISCOUS HCL 2 % MT SOLN
15.0000 mL | Freq: Once | OROMUCOSAL | Status: AC
  Administered 2022-06-14: 15 mL via OROMUCOSAL
  Filled 2022-06-14: qty 15

## 2022-06-14 NOTE — Discharge Instructions (Signed)
Zofran as needed as prescribed for nausea and vomiting.  Take omeprazole daily as prescribed.  Follow-up with your primary care provider, please call tomorrow to schedule follow-up appointment.

## 2022-06-14 NOTE — ED Notes (Signed)
Apple juice provided for po challenge.

## 2022-06-14 NOTE — ED Provider Notes (Signed)
47 yo female with alcoholic gastritis, vomiting and upper abd pain onset last night.  HR gradually improving after 2 L NS and Ativan, Protonix IV.  Obs for vomiting, if improving, can likely dc. If intractable vomiting, elevated CIWA admit.  Physical Exam  BP 122/84   Pulse (!) 102   Temp 98 F (36.7 C)   Resp (!) 22   Ht '5\' 5"'$  (1.651 m)   Wt 77.1 kg   LMP 06/19/2013   SpO2 96%   BMI 28.29 kg/m   Physical Exam  Procedures  Procedures  ED Course / MDM    Medical Decision Making Amount and/or Complexity of Data Reviewed Labs: ordered.  Risk OTC drugs. Prescription drug management.   1700hrs Discussed results with patient.  Attempted p.o. potassium supplement for mild hypokalemia.  Patient had additional episode of emesis.  Offered patient additional medications and admission for intractable vomiting versus meds and continue p.o. challenge.  Patient would prefer to try additional meds and PO challenge again.  1930hrs tolerating small sips of liquids.  Plan is for discharge with prescription for Zofran and omeprazole with plan to recheck with primary care provider.       Tacy Learn, PA-C 06/14/22 Marilynne Drivers, MD 06/15/22 [phone]

## 2022-06-14 NOTE — ED Triage Notes (Signed)
Pt reports abdominal pain, nausea, and vomiting that started 3 days ago. Reports hx of pancreatitis. She is concerned she may also be having alcohol withdraws. She reports drinking a lot of liquor last night. Pt is AxOx4. Tremors noted during triage.

## 2022-06-14 NOTE — ED Provider Notes (Addendum)
[facility] Provider Note   CSN: [id number] Arrival date & time: 06/14/22  1000     History  Chief Complaint  Patient presents with   Abdominal Pain   Emesis   Alcohol Problem    Danielle Reilly is a 47 y.o. female.  Patient with a partial hysterectomy presents to the emergency department for evaluation of abdominal pain and vomiting.  Symptoms have been occurring intermittently over the past 2 days.  She reports upper abdominal pain that radiates to her bilateral lower back.  She has a history of alcohol induced pancreatitis and states that the symptoms feel similar.  She has been drinking alcohol in the evenings.  Last alcohol use was yesterday.  She states that she does not drink every day.  She has been somewhat tremulous.  No diarrhea.  Vomiting is nonbloody.  No urinary symptoms.       Home Medications Prior to Admission medications   Medication Sig Start Date End Date Taking? Authorizing Provider  acetaminophen (TYLENOL) 500 MG tablet Take 2 tablets (1,000 mg total) by mouth every 8 (eight) hours as needed for mild pain, fever or headache. 12/24/21 12/24/22  Barton Dubois, MD  b complex vitamins capsule Take 1 capsule by mouth daily.    [provider]  fluticasone (FLONASE) 50 MCG/ACT nasal spray Place 1 spray into both nostrils daily as needed for allergies or rhinitis.    [provider]  folic acid (FOLVITE) 1 MG tablet Take 1 tablet (1 mg total) by mouth daily. 12/25/21   Barton Dubois, MD  Multiple Vitamin (MULTIVITAMINS PO) Take 1 tablet by mouth daily.    [provider]  omeprazole (PRILOSEC) 40 MG capsule Take 1 capsule (40 mg total) by mouth in the morning and at bedtime. 12/24/21   Barton Dubois, MD  ondansetron (ZOFRAN-ODT) 8 MG disintegrating tablet Take 1 tablet (8 mg total) by mouth every 8 (eight) hours as needed for nausea or vomiting. 12/24/21   Barton Dubois, MD  oxycodone (OXY-IR) 5 MG capsule  Take 1 capsule (5 mg total) by mouth every 6 (six) hours as needed (sever pain). 12/24/21   Barton Dubois, MD  thiamine (VITAMIN B-1) 100 MG tablet Take 1 tablet (100 mg total) by mouth daily. 12/25/21   Barton Dubois, MD  famotidine (PEPCID) 20 MG tablet Take 1 tablet (20 mg total) by mouth daily. Patient not taking: Reported on 10/28/2019 05/19/19 04/06/20  Isla Pence, MD      Allergies    Fluconazole and Hydrocodone    Review of Systems   Review of Systems  Physical Exam Updated Vital Signs BP (!) 126/102   Pulse (!) 121   Temp 98.1 F (36.7 C)   Resp 18   Ht '5\' 5"'$  (1.651 m)   Wt 77.1 kg   LMP 06/19/2013   SpO2 100%   BMI 28.29 kg/m   Physical Exam Vitals and nursing note reviewed.  Constitutional:      General: She is not in acute distress.    Appearance: She is well-developed.  HENT:     Head: Normocephalic and atraumatic.     Right Ear: External ear normal.     Left Ear: External ear normal.     Nose: Nose normal.  Eyes:     Conjunctiva/sclera: Conjunctivae normal.  Cardiovascular:     Rate and Rhythm: Regular rhythm. Tachycardia present.     Heart sounds: No murmur heard. Pulmonary:  Effort: No respiratory distress.     Breath sounds: No wheezing, rhonchi or rales.  Abdominal:     Palpations: Abdomen is soft.     Tenderness: There is abdominal tenderness in the right upper quadrant, epigastric area and left upper quadrant. There is no guarding or rebound. Negative signs include Murphy's sign and McBurney's sign.  Musculoskeletal:     Cervical back: Normal range of motion and neck supple.     Right lower leg: No edema.     Left lower leg: No edema.  Skin:    General: Skin is warm and dry.     Findings: No rash.  Neurological:     General: No focal deficit present.     Mental Status: She is alert. Mental status is at baseline.     Motor: Tremor present. No weakness.     Comments: Mildly tremulous.  In room, patient is standing trying to get the TV  to work properly.  She is ambulating without difficulties.  Psychiatric:        Mood and Affect: Mood normal.     ED Results / Procedures / Treatments   Labs (all labs ordered are listed, but only abnormal results are displayed) Labs Reviewed  COMPREHENSIVE METABOLIC PANEL - Abnormal; Notable for the following components:      Result Value   Sodium 134 (*)    Potassium 3.4 (*)    Chloride 92 (*)    Glucose, Bld 123 (*)    BUN <5 (*)    AST 162 (*)    ALT 80 (*)    Total Bilirubin 1.6 (*)    Anion gap 19 (*)    All other components within normal limits  URINALYSIS, ROUTINE W REFLEX MICROSCOPIC - Abnormal; Notable for the following components:   Specific Gravity, Urine >1.030 (*)    Glucose, UA 100 (*)    Hgb urine dipstick MODERATE (*)    Bilirubin Urine SMALL (*)    Ketones, ur 40 (*)    Protein, ur 100 (*)    Nitrite POSITIVE (*)    All other components within normal limits  URINALYSIS, MICROSCOPIC (REFLEX) - Abnormal; Notable for the following components:   Bacteria, UA MANY (*)    All other components within normal limits  POTASSIUM - Abnormal; Notable for the following components:   Potassium 3.4 (*)    All other components within normal limits  ETHANOL  RAPID URINE DRUG SCREEN, HOSP PERFORMED  LIPASE, BLOOD  CBC  I-STAT BETA HCG BLOOD, ED ([facility], [facility], [facility] ONLY)    EKG EKG Interpretation  Date/Time:  Wednesday June 14 2022 10:37:10 EST Ventricular Rate:  118 PR Interval:  128 QRS Duration: 70 QT Interval:  338 QTC Calculation: 473 R Axis:   80 Text Interpretation: Sinus tachycardia ST & T wave abnormality, consider inferior ischemia ST & T wave abnormality, consider anterolateral ischemia Abnormal ECG When compared with ECG of 20-Dec-2021 12:28, PREVIOUS ECG IS PRESENT Confirmed by Regan Lemming ([id number]) on 06/14/2022 11:46:49 AM  Radiology No results found.  Procedures Procedures    Medications Ordered in ED Medications  LORazepam (ATIVAN) injection 1  mg (1 mg Intravenous Given 06/14/22 1137)  sodium chloride 0.9 % bolus 1,000 mL (1,000 mLs Intravenous Bolus from Bag 06/14/22 1138)  pantoprazole (PROTONIX) injection 40 mg (40 mg Intravenous Given 06/14/22 1138)  ondansetron (ZOFRAN) injection 4 mg (4 mg Intravenous Given 06/14/22 1322)  sodium chloride 0.9 % bolus 1,000 mL (1,000 mLs Intravenous Bolus  from Bag 06/14/22 1322)  HYDROmorphone (DILAUDID) injection 0.5 mg (0.5 mg Intravenous Given 06/14/22 1351)  LORazepam (ATIVAN) injection 1 mg (1 mg Intravenous Given 06/14/22 1423)  alum & mag hydroxide-simeth (MAALOX/MYLANTA) 200-200-20 MG/5ML suspension 30 mL (30 mLs Oral Given 06/14/22 1423)  lidocaine (XYLOCAINE) 2 % viscous mouth solution 15 mL (15 mLs Mouth/Throat Given 06/14/22 1423)    ED Course/ Medical Decision Making/ A&P    Patient seen and examined. History obtained directly from patient.  Reviewed previous hospitalization notes.  Reviewed previous CT scan results.  CIWA calculated to be 11.  Labs/EKG: Ordered CBC, CMP, lipase, ethanol.  Imaging: None ordered  Medications/Fluids: Ordered: IV fluid bolus, IV Protonix, IV Ativan.  Most recent vital signs reviewed and are as follows: BP (!) 126/102   Pulse (!) 121   Temp 98.1 F (36.7 C)   Resp 18   Ht '5\' 5"'$  (1.651 m)   Wt 77.1 kg   LMP 06/19/2013   SpO2 100%   BMI 28.29 kg/m   Initial impression: Nausea and vomiting in setting of alcohol use, need to evaluate for possibility of gastritis versus pancreatitis.  3:02 PM Reassessment performed. Patient appears stable.  Continue to hydrate.  Heart rate continues to be elevated into the 120s.  Additional Ativan and IV fluids ordered.  Patient also requesting GI cocktail due to throat pain from vomiting.  She was able to sip a small amount of fluids at bedside.  Labs personally reviewed and interpreted including: CBC with normal white blood cell count, normal hemoglobin although slightly above recent levels which could indicate a  degree of hemoconcentration; CMP with mildly elevated transaminases consistent with previous, glucose 123 with anion gap of 19, potassium 3.4 with hemolysis; lipase 33; ethanol neg; preg neg; UDS neg.   Reviewed pertinent lab work and imaging with patient at bedside. Questions answered.   Most current vital signs reviewed and are as follows: BP 116/72   Pulse (!) 128   Temp 98.1 F (36.7 C)   Resp (!) 31   Ht '5\' 5"'$  (1.651 m)   Wt 77.1 kg   LMP 06/19/2013   SpO2 98%   BMI 28.29 kg/m   Plan: Continue therapy, reassess.   If N/V controlled, CIWA improving, tachycardia improving, can potentially go home with symptomatic treatment.   If symptoms not improving, may need admission to further stabilize.   Signout to [employer] at shift change.                              Medical Decision Making Amount and/or Complexity of Data Reviewed Labs: ordered.  Risk OTC drugs. Prescription drug management.     Final Clinical Impression(s) / ED Diagnoses Final diagnoses:  Acute alcoholic gastritis without hemorrhage  Nausea and vomiting, unspecified vomiting type   For this patient's complaint of abdominal pain, the following conditions were considered on the differential diagnosis: gastritis/PUD, enteritis/duodenitis, appendicitis, cholelithiasis/cholecystitis, cholangitis, pancreatitis, ruptured viscus, colitis, diverticulitis, small/large bowel obstruction, proctitis, cystitis, pyelonephritis, ureteral colic, aortic dissection, aortic aneurysm. In women, ectopic pregnancy, pelvic inflammatory disease, ovarian cysts, and tubo-ovarian abscess were also considered. Atypical chest etiologies were also considered including ACS, PE, and pneumonia.   Rx / DC Orders ED Discharge Orders     None         Carlisle Cater, Hershal Coria 06/14/22 1523    Regan Lemming, MD 06/14/22 [phone]

## 2022-06-15 ENCOUNTER — Telehealth: Payer: Self-pay

## 2022-06-15 NOTE — Transitions of Care (Post Inpatient/ED Visit) (Signed)
   06/15/2022  Name: Danielle Reilly MRN: [id number] DOB: 1975-06-24  Today's TOC FU Call Status: Today's TOC FU Call Status:: Unsuccessul Call (1st Attempt) Unsuccessful Call (1st Attempt) Date: 06/15/22  Attempted to reach the patient regarding the most recent Inpatient/ED visit.  Follow Up Plan: Additional outreach attempts will be made to reach the patient to complete the Transitions of Care (Post Inpatient/ED visit) call.   Signature Juanda Crumble, [place] Direct Dial [phone]

## 2022-06-16 NOTE — Transitions of Care (Post Inpatient/ED Visit) (Signed)
   06/16/2022  Name: Danielle Reilly MRN: [id number] DOB: 1976/03/03  Today's TOC FU Call Status: Today's TOC FU Call Status:: Unsuccessful Call (2nd Attempt) Unsuccessful Call (1st Attempt) Date: 06/15/22 Unsuccessful Call (2nd Attempt) Date: 06/16/22  Attempted to reach the patient regarding the most recent Inpatient/ED visit.  Follow Up Plan: No further outreach attempts will be made at this time. We have been unable to contact the patient.  Signature Juanda Crumble, [place] Direct Dial [phone]

## 2022-07-14 ENCOUNTER — Encounter (HOSPITAL_COMMUNITY): Payer: Self-pay

## 2022-07-14 ENCOUNTER — Emergency Department (HOSPITAL_COMMUNITY)
Admission: EM | Admit: 2022-07-14 | Discharge: 2022-07-14 | Disposition: A | Discharge status: Home / Self Care | Payer: Medicaid Other | Attending: Emergency Medicine | Admitting: Emergency Medicine

## 2022-07-14 ENCOUNTER — Emergency Department (HOSPITAL_COMMUNITY): Payer: Medicaid Other

## 2022-07-14 ENCOUNTER — Other Ambulatory Visit: Payer: Self-pay

## 2022-07-14 DIAGNOSIS — R1013 Epigastric pain: Secondary | ICD-10-CM | POA: Diagnosis present

## 2022-07-14 DIAGNOSIS — R7989 Other specified abnormal findings of blood chemistry: Secondary | ICD-10-CM | POA: Diagnosis not present

## 2022-07-14 DIAGNOSIS — F1010 Alcohol abuse, uncomplicated: Secondary | ICD-10-CM | POA: Diagnosis not present

## 2022-07-14 DIAGNOSIS — R000 Tachycardia, unspecified: Secondary | ICD-10-CM | POA: Insufficient documentation

## 2022-07-14 LAB — COMPREHENSIVE METABOLIC PANEL
ALT: 126 U/L — ABNORMAL HIGH (ref 0–44)
AST: 230 U/L — ABNORMAL HIGH (ref 15–41)
Albumin: 4.5 g/dL (ref 3.5–5.0)
Alkaline Phosphatase: 123 U/L (ref 38–126)
Anion gap: 17 — ABNORMAL HIGH (ref 5–15)
BUN: <5 mg/dL — ABNORMAL LOW (ref 6–20)
CO2: 21 mmol/L — ABNORMAL LOW (ref 22–32)
Calcium: 9.4 mg/dL (ref 8.9–10.3)
Chloride: 90 mmol/L — ABNORMAL LOW (ref 98–111)
Creatinine, Ser: 1.16 mg/dL — ABNORMAL HIGH (ref 0.44–1.00)
GFR, Estimated: 59 mL/min — ABNORMAL LOW (ref >60)
Glucose, Bld: 125 mg/dL — ABNORMAL HIGH (ref 70–99)
Potassium: 4.1 mmol/L (ref 3.5–5.1)
Sodium: 128 mmol/L — ABNORMAL LOW (ref 135–145)
Total Bilirubin: 3.7 mg/dL — ABNORMAL HIGH (ref 0.3–1.2)
Total Protein: 8 g/dL (ref 6.5–8.1)

## 2022-07-14 LAB — CBC
HCT: 39.2 % (ref 36.0–46.0)
Hemoglobin: 13.6 g/dL (ref 12.0–15.0)
MCH: 30.6 pg (ref 26.0–34.0)
MCHC: 34.7 g/dL (ref 30.0–36.0)
MCV: 88.3 fL (ref 80.0–100.0)
Platelets: 205 10*3/uL (ref 150–400)
RBC: 4.44 MIL/uL (ref 3.87–5.11)
RDW: 17.1 % — ABNORMAL HIGH (ref 11.5–15.5)
WBC: 5.1 10*3/uL (ref 4.0–10.5)
nRBC: 0 % (ref 0.0–0.2)

## 2022-07-14 LAB — RAPID URINE DRUG SCREEN, HOSP PERFORMED
Amphetamines: NOT DETECTED
Barbiturates: NOT DETECTED
Benzodiazepines: POSITIVE — AB
Cocaine: NOT DETECTED
Opiates: NOT DETECTED
Tetrahydrocannabinol: NOT DETECTED

## 2022-07-14 LAB — ETHANOL: Alcohol, Ethyl (B): <10 mg/dL (ref <10)

## 2022-07-14 LAB — I-STAT BETA HCG BLOOD, ED (MC, WL, AP ONLY): I-stat hCG, quantitative: <5 m[IU]/mL (ref <5)

## 2022-07-14 LAB — LIPASE, BLOOD: Lipase: 32 U/L (ref 11–51)

## 2022-07-14 MED ORDER — THIAMINE MONONITRATE 100 MG PO TABS
100.0000 mg | ORAL_TABLET | Freq: Every day | ORAL | Status: DC
  Administered 2022-07-14: 100 mg via ORAL
  Filled 2022-07-14: qty 1

## 2022-07-14 MED ORDER — LORAZEPAM 2 MG/ML IJ SOLN
2.0000 mg | Freq: Once | INTRAMUSCULAR | Status: AC
  Administered 2022-07-14: 2 mg via INTRAVENOUS
  Filled 2022-07-14: qty 1

## 2022-07-14 MED ORDER — IOHEXOL 350 MG/ML SOLN
75.0000 mL | Freq: Once | INTRAVENOUS | Status: AC | PRN
  Administered 2022-07-14: 75 mL via INTRAVENOUS

## 2022-07-14 MED ORDER — ADULT MULTIVITAMIN W/MINERALS CH
1.0000 | ORAL_TABLET | Freq: Every day | ORAL | Status: DC
  Administered 2022-07-14: 1 via ORAL
  Filled 2022-07-14: qty 1

## 2022-07-14 MED ORDER — PANTOPRAZOLE 80MG IVPB - SIMPLE MED
80.0000 mg | Freq: Once | INTRAVENOUS | Status: AC
  Administered 2022-07-14: 80 mg via INTRAVENOUS
  Filled 2022-07-14: qty 100

## 2022-07-14 MED ORDER — LACTATED RINGERS IV BOLUS
1000.0000 mL | Freq: Once | INTRAVENOUS | Status: AC
  Administered 2022-07-14: 1000 mL via INTRAVENOUS

## 2022-07-14 MED ORDER — PANTOPRAZOLE SODIUM 40 MG IV SOLR: 40.0000 mg | Freq: Once | INTRAVENOUS | Status: DC

## 2022-07-14 MED ORDER — LORAZEPAM 1 MG PO TABS
1.0000 mg | ORAL_TABLET | ORAL | Status: DC | PRN
  Administered 2022-07-14: 2 mg via ORAL
  Administered 2022-07-14: 1 mg via ORAL
  Filled 2022-07-14: qty 1
  Filled 2022-07-14: qty 2

## 2022-07-14 MED ORDER — ONDANSETRON 4 MG PO TBDP: 4.0000 mg | ORAL_TABLET | Freq: Three times a day (TID) | ORAL | 0 refills | Status: DC | PRN

## 2022-07-14 MED ORDER — CHLORDIAZEPOXIDE HCL 25 MG PO CAPS: ORAL_CAPSULE | ORAL | 0 refills | Status: DC

## 2022-07-14 MED ORDER — FOLIC ACID 1 MG PO TABS
1.0000 mg | ORAL_TABLET | Freq: Every day | ORAL | Status: DC
  Administered 2022-07-14: 1 mg via ORAL
  Filled 2022-07-14: qty 1

## 2022-07-14 MED ORDER — PANTOPRAZOLE SODIUM 40 MG PO TBEC: 40.0000 mg | DELAYED_RELEASE_TABLET | Freq: Every day | ORAL | 0 refills | Status: DC

## 2022-07-14 MED ORDER — LORAZEPAM 2 MG/ML IJ SOLN: 1.0000 mg | INTRAMUSCULAR | Status: DC | PRN

## 2022-07-14 MED ORDER — FENTANYL CITRATE PF 50 MCG/ML IJ SOSY
50.0000 ug | PREFILLED_SYRINGE | Freq: Once | INTRAMUSCULAR | Status: AC
  Administered 2022-07-14: 50 ug via INTRAVENOUS
  Filled 2022-07-14: qty 1

## 2022-07-14 MED ORDER — THIAMINE HCL 100 MG/ML IJ SOLN
100.0000 mg | Freq: Every day | INTRAMUSCULAR | Status: DC
  Filled 2022-07-14: qty 2

## 2022-07-14 MED ORDER — MORPHINE SULFATE (PF) 2 MG/ML IV SOLN
2.0000 mg | Freq: Once | INTRAVENOUS | Status: AC
  Administered 2022-07-14: 2 mg via INTRAVENOUS
  Filled 2022-07-14: qty 1

## 2022-07-14 MED ORDER — LACTATED RINGERS IV BOLUS
500.0000 mL | Freq: Once | INTRAVENOUS | Status: AC
  Administered 2022-07-14: 500 mL via INTRAVENOUS

## 2022-07-14 MED ORDER — ONDANSETRON HCL 4 MG/2ML IJ SOLN
4.0000 mg | Freq: Once | INTRAMUSCULAR | Status: AC
  Administered 2022-07-14: 4 mg via INTRAVENOUS
  Filled 2022-07-14: qty 2

## 2022-07-14 NOTE — ED Notes (Signed)
Assume care of pt. Pt is A/O x4 w/ even and unlabored breathing. Pt is C/O persistent abdominal pain 10/10. Per RN visual assessment pt does not look to be in distress but will relay this to the provider.

## 2022-07-14 NOTE — Discharge Instructions (Addendum)
Substance Abuse Treatment Programs ° °Intensive Outpatient Programs °[facility]     °[address]      °[place], [place]                   °[phone]      ° °[facility] °[address] °[place], [place] °[phone] ° °[facility]     °(Inpatient and outpatient)     °[address]           °[phone]   ° °[facility] °[phone] (Suboxone and Methadone) ° °[address]      °[place], [place] [address]      °[phone]      ° °[address] °[place], [place] °[phone] ° °[facility] (Outpatient/Inpatient, Chemical)    °(insurance only) [phone]      °       °[facility] (Groups & Residential) °[place], [place] °[phone] ° °   °[facility]     °[address]     °[place], [place]      °[phone]      ° °[facility] (for caregivers and family) °[address] °[place], [place] °[phone] ° ° ° ° ° °Residential Treatment Programs °[facility]      °[address], [place], [place] [address]  °[phone]      ° °[facility] °[address] [place], [place] [address] °[phone] ° °[facility]        °[phone]      ° °[facility] °[phone] ° °[facility]             °[address]                                         °[place], [place]                                                °[phone] or [phone]                              ° °[facility] [place] °[address] °[place], [address] °[phone] ° °[facility]    °[address]      °[place], [place]     °[phone]      ° °[facility] °[address] °, [place] °[phone] ° °[facility]   °[address]     °[place], [place] [address]     °[phone]      °Admissions: 8am-3pm M-F ° °[facility] °[address] °[place],  [place] °336-227-7417 ° °[facility]: Residential Program (90 Days)   °[place], [place]      °[phone] or [phone]    ° °[facility] °[place], [place] °(Walk in Hours over the weekend or by referral) ° °[facility] °[address], [place], [place] [address] °[phone] ° °Crisis Mobile: [facility]:  1-877-626-1772 (for crisis response 24 hours a day) °[facility] Hotline:      1-800-256-2452 °Outpatient Psychiatry and Counseling ° °[facility]: Mobile Crisis   Management 24 hours:  1-877-626-1772 ° °[facility] sliding scale fee and walk in schedule: M-F 8am-12pm/1pm-3pm °[address]  °[place], [place] [address] °[phone] ° °[facility] °[address] °[place], [place] [address] °[phone] ° °[facility] ([facility])- new patient walk-in appointments available Monday - Friday 8am -3pm.          °[address] °[place], [place] [address] °336-676-6840 or crisis line- 336-676-6905 ° °[facility] Intensive Outpatient Therapy Program °[address] °[place], [place] [address] °336-832-9804 ° °[facility]                  °Crisis Services      °[phone]      °[address]     °[place], [place] [address]                ° °[facility]   °[facility] °[phone] °[address] °[place], [place] [address] ° ° °[facility]          °[address],  °[place], [place] [address]       °[phone] ° °[facility] °[address] °[place], [place] [address] °336-292-1510 ° °[facility]    °[address]    °[place], [place] [address]     °[phone]      ° °[facility] McKinney, [place]     °[address]     °[place] [address]     °[phone]     °  °[facility] °[address] °[place] [address] ° °[facility]     °[address]     °[place], [place]      °[phone]      ° °[facility] °Shamsher Ahluwalia, MD °[address] °[place], [place] [address] °[phone] ° °[facility]     °[address]     °[place], [place] [address]     °336-274-1237      ° °[facility] °[address] °[place], [place] [address] °336-854-4450 °Resources for Temporary Residential Assistance/Crisis Centers ° °DAY CENTERS °[facility] °M-F 8am-3pm   °[address] [place], [place] [address]   336-332-0824 °Services include: laundry, barbering, support groups, case management, phone  & computer access, showers, AA/NA mtgs, mental health/substance abuse nurse, job skills class, disability information, VA assistance, spiritual classes, etc.  ° °HOMELESS SHELTERS ° °[facility]     °[facility]   °[address], [place]     °[phone]       °       °[facility] (women and children)       °[address] °[place], [place] [address] °336-275-0820 °[email or web address] for application and process °Application Required ° °[facility]   °[address]    °[place] [address]     °[phone]       °             °[facility] °[address] °, [place] [address] °336.273.5572 °336-235-0363(schedule application appt.) °Application Required ° °[facility] (women only)    °[address]     °[place],  [address]     °336-884-1039      °  Intake starts 6pm daily °Need valid ID, SSC, & Police report °[facility] [place] °[address] °[place], [place] °336-881-5420 °Application Required ° °[facility] (men only)     °[address]      °[place], [place]     °[phone]      ° °[facility] °(Pregnant women only) °[address] °[place], [place] °336-275-0206 ° °[facility]      °[address]      °[place], [place] [address]     °[phone]      °       °[facility] °[address] °[place], [place] °336-723-1848 °90 day commitment/SA/Application process ° °[facility](men only)     °[address]     °[place], [place]     °[phone]       °Check-in at 7pm     °       °[facility] °[address] °[place], [place] [address] °336-248-6684 °Men/Women/Women and Children must be there by 7 pm ° °[facility] °[place], [place] °336-722-8721                ° °

## 2022-07-14 NOTE — ED Triage Notes (Signed)
Pt came in via POV d/t her feeling like her pancreatitis & gastritis has worsened because she has been binge drinking for the past week. Pt reports she has not had an ETOH since yesterday & at the least will have 1 bottle of wine & at her worst will finish a pint of liquor. Pt is very anxious & pacing while in triage & reports her abd pain is 10/10.

## 2022-07-14 NOTE — ED Provider Notes (Signed)
Blood pressure 112/79, pulse (!) 116, temperature 98.4 F (36.9 C), temperature source Oral, resp. rate 20, last menstrual period 06/19/2013, SpO2 98 %.  Assuming care from Dr. Jeanell Sparrow.  In short, Danielle Reilly is a 47 y.o. female with a chief complaint of ETOH Withdrawal and Abdominal Pain .  Refer to the original H&P for additional details.  The current plan of care is to follow up on symptom mgmt and reassess.  11:24 PM  Patient's tachycardia gradually improving.  She is very comfortable, awake, alert.  Her CIWA is very low.  CT imaging shows fatty liver but no other acute abnormality.  I provided list of resources for alcohol withdrawal treatment although do not see an indication for admission for alcohol withdrawal.  She does have mild hyponatremia likely in the setting of chronic alcoholism.  She is eating and drinking here in the ED without difficulty.  Appears stable for discharge.    Margette Fast, MD 07/14/22 2325

## 2022-07-17 ENCOUNTER — Telehealth: Payer: Self-pay

## 2022-07-17 NOTE — Transitions of Care (Post Inpatient/ED Visit) (Signed)
   07/17/2022  Name: Danielle Reilly MRN: [id number] DOB: 1975/08/08  Today's TOC FU Call Status:    Attempted to reach the patient regarding the most recent Inpatient/ED visit.  Follow Up Plan: Additional outreach attempts will be made to reach the patient to complete the Transitions of Care (Post Inpatient/ED visit) call.   Signature: [employer] Greenleigh Kauth, LPN.

## 2022-07-24 ENCOUNTER — Encounter: Payer: Self-pay | Admitting: Emergency Medicine

## 2022-07-24 ENCOUNTER — Ambulatory Visit (INDEPENDENT_AMBULATORY_CARE_PROVIDER_SITE_OTHER): Payer: Medicaid Other | Admitting: Emergency Medicine

## 2022-07-24 VITALS — BP 110/64 | HR 60 | Temp 98.5°F | Ht 65.0 in | Wt 165.1 lb

## 2022-07-24 DIAGNOSIS — Z130 Encounter for screening for diseases of the blood and blood-forming organs and certain disorders involving the immune mechanism: Secondary | ICD-10-CM | POA: Diagnosis not present

## 2022-07-24 DIAGNOSIS — Z1211 Encounter for screening for malignant neoplasm of colon: Secondary | ICD-10-CM | POA: Diagnosis not present

## 2022-07-24 DIAGNOSIS — F1011 Alcohol abuse, in remission: Secondary | ICD-10-CM

## 2022-07-24 DIAGNOSIS — Z0000 Encounter for general adult medical examination without abnormal findings: Secondary | ICD-10-CM

## 2022-07-24 DIAGNOSIS — Z13228 Encounter for screening for other metabolic disorders: Secondary | ICD-10-CM

## 2022-07-24 DIAGNOSIS — Z13220 Encounter for screening for lipoid disorders: Secondary | ICD-10-CM

## 2022-07-24 DIAGNOSIS — Z1329 Encounter for screening for other suspected endocrine disorder: Secondary | ICD-10-CM

## 2022-07-24 DIAGNOSIS — F32A Depression, unspecified: Secondary | ICD-10-CM

## 2022-07-24 LAB — LIPID PANEL
Cholesterol: 204 mg/dL — ABNORMAL HIGH (ref 0–200)
HDL: 66.1 mg/dL (ref >39.00)
LDL Cholesterol: 119 mg/dL — ABNORMAL HIGH (ref 0–99)
NonHDL: 137.59
Total CHOL/HDL Ratio: 3
Triglycerides: 92 mg/dL (ref 0.0–149.0)
VLDL: 18.4 mg/dL (ref 0.0–40.0)

## 2022-07-24 LAB — COMPREHENSIVE METABOLIC PANEL
ALT: 58 U/L — ABNORMAL HIGH (ref 0–35)
AST: 55 U/L — ABNORMAL HIGH (ref 0–37)
Albumin: 4.1 g/dL (ref 3.5–5.2)
Alkaline Phosphatase: 64 U/L (ref 39–117)
BUN: 7 mg/dL (ref 6–23)
CO2: 27 mEq/L (ref 19–32)
Calcium: 9.7 mg/dL (ref 8.4–10.5)
Chloride: 106 mEq/L (ref 96–112)
Creatinine, Ser: 0.83 mg/dL (ref 0.40–1.20)
GFR: 84.45 mL/min (ref >60.00)
Glucose, Bld: 80 mg/dL (ref 70–99)
Potassium: 3.5 mEq/L (ref 3.5–5.1)
Sodium: 139 mEq/L (ref 135–145)
Total Bilirubin: 0.6 mg/dL (ref 0.2–1.2)
Total Protein: 6.8 g/dL (ref 6.0–8.3)

## 2022-07-24 LAB — CBC WITH DIFFERENTIAL/PLATELET
Basophils Absolute: 0.1 10*3/uL (ref 0.0–0.1)
Basophils Relative: 1.1 % (ref 0.0–3.0)
Eosinophils Absolute: 0 10*3/uL (ref 0.0–0.7)
Eosinophils Relative: 0.4 % (ref 0.0–5.0)
HCT: 35.2 % — ABNORMAL LOW (ref 36.0–46.0)
Hemoglobin: 11.4 g/dL — ABNORMAL LOW (ref 12.0–15.0)
Lymphocytes Relative: 31.9 % (ref 12.0–46.0)
Lymphs Abs: 1.6 10*3/uL (ref 0.7–4.0)
MCHC: 32.4 g/dL (ref 30.0–36.0)
MCV: 94.4 fl (ref 78.0–100.0)
Monocytes Absolute: 0.7 10*3/uL (ref 0.1–1.0)
Monocytes Relative: 13.8 % — ABNORMAL HIGH (ref 3.0–12.0)
Neutro Abs: 2.6 10*3/uL (ref 1.4–7.7)
Neutrophils Relative %: 52.8 % (ref 43.0–77.0)
Platelets: 276 10*3/uL (ref 150.0–400.0)
RBC: 3.73 Mil/uL — ABNORMAL LOW (ref 3.87–5.11)
RDW: 17.7 % — ABNORMAL HIGH (ref 11.5–15.5)
WBC: 4.9 10*3/uL (ref 4.0–10.5)

## 2022-07-24 LAB — TSH: TSH: 2.95 u[IU]/mL (ref 0.35–5.50)

## 2022-07-24 LAB — HEMOGLOBIN A1C: Hgb A1c MFr Bld: 5.1 % (ref 4.6–6.5)

## 2022-07-24 MED ORDER — FOLIC ACID 1 MG PO TABS: 1.0000 mg | ORAL_TABLET | Freq: Every day | ORAL | 1 refills | Status: DC

## 2022-07-24 MED ORDER — VITAMIN B-1 100 MG PO TABS: 100.0000 mg | ORAL_TABLET | Freq: Every day | ORAL | 1 refills | Status: DC

## 2022-07-24 MED ORDER — OMEPRAZOLE 40 MG PO CPDR: 40.0000 mg | DELAYED_RELEASE_CAPSULE | Freq: Two times a day (BID) | ORAL | 0 refills | Status: DC

## 2022-07-24 NOTE — Patient Instructions (Signed)

## 2022-07-24 NOTE — Progress Notes (Signed)
Danielle Reilly 47 y.o.   Chief Complaint  Patient presents with   Annual Exam    Patient wants to be referral to a psychologist/psych grief counseling     HISTORY OF PRESENT ILLNESS: This is a 47 y.o. female here for annual exam Patient has history of chronic EtOH abuse in the past.  Sober for the past 9 days. Has history of chronic depression.  Requesting psychiatry referral. No other complaints or medical concerns today.  HPI   Prior to Admission medications   Medication Sig Start Date End Date Taking? Authorizing Provider  acetaminophen (TYLENOL) 500 MG tablet Take 2 tablets (1,000 mg total) by mouth every 8 (eight) hours as needed for mild pain, fever or headache. 12/24/21 12/24/22 Yes Vassie Loll, MD  b complex vitamins capsule Take 1 capsule by mouth daily.   Yes [provider]  chlordiazePOXIDE (LIBRIUM) 25 MG capsule 50mg  PO TID x 1D, then 25-50mg  PO BID X 1D, then 25-50mg  PO QD X 1D 07/14/22  Yes Long, Arlyss Repress, MD  fluticasone (FLONASE) 50 MCG/ACT nasal spray Place 1 spray into both nostrils daily as needed for allergies or rhinitis.   Yes [provider]  Multiple Vitamin (MULTIVITAMINS PO) Take 1 tablet by mouth daily.   Yes [provider]  ondansetron (ZOFRAN-ODT) 4 MG disintegrating tablet Take 1 tablet (4 mg total) by mouth every 8 (eight) hours as needed for nausea or vomiting. 07/14/22  Yes Long, Arlyss Repress, MD  folic acid (FOLVITE) 1 MG tablet Take 1 tablet (1 mg total) by mouth daily. 07/24/22   Georgina Quint, MD  omeprazole (PRILOSEC) 40 MG capsule Take 1 capsule (40 mg total) by mouth in the morning and at bedtime. 07/24/22   Georgina Quint, MD  thiamine (VITAMIN B-1) 100 MG tablet Take 1 tablet (100 mg total) by mouth daily. 07/24/22   Georgina Quint, MD  famotidine (PEPCID) 20 MG tablet Take 1 tablet (20 mg total) by mouth daily. Patient not taking: Reported on 10/28/2019 05/19/19 04/06/20  Jacalyn Lefevre, MD    Allergies   Allergen Reactions   Fluconazole Hives   Hydrocodone Nausea And Vomiting    Patient Active Problem List   Diagnosis Date Noted   Generalized anxiety disorder 12/06/2020   History of gastritis 12/06/2020   History of alcohol abuse 12/06/2020   GERD (gastroesophageal reflux disease) 06/17/2019    Past Medical History:  Diagnosis Date   Anemia    GERD (gastroesophageal reflux disease)    tums as needed   Hemoperitoneum 12/25/2010   Nausea and vomiting 06/17/2019   SVD (spontaneous vaginal delivery)    x 1   Toothache 01/29/2013    Past Surgical History:  Procedure Laterality Date   BILATERAL SALPINGECTOMY N/A 06/19/2013   Procedure: BILATERAL SALPINGECTOMY;  Surgeon: Willodean Rosenthal, MD;  Location: [facility];  Service: Gynecology;  Laterality: N/A;   DILITATION & CURRETTAGE/HYSTROSCOPY WITH NOVASURE ABLATION N/A 04/04/2013   Procedure: DILATATION & CURETTAGE/HYSTEROSCOPY WITH Attempted NOVASURE ABLATION ;  Surgeon: Willodean Rosenthal, MD;  Location: [facility];  Service: Gynecology;  Laterality: N/A;   HYSTEROSCOPY N/A 05/26/2013   Procedure: UNSUCCESSFUL HYSTEROSCOPY WITH HYDROTHERMAL ABLATION;  Surgeon: Willodean Rosenthal, MD;  Location: [facility];  Service: Gynecology;  Laterality: N/A;   LAPAROSCOPY  12/25/2010   Procedure: LAPAROSCOPY OPERATIVE;  Surgeon: Roseanna Rainbow, MD;  Location: [facility];  Service: Gynecology;  Laterality: N/A;  Operative laparoscopy /cauterization of right hemorrhagic cyst wall. Removal of belly rings.  LAPAROSCOPY FOR ECTOPIC PREGNANCY     NOVASURE ABLATION N/A 05/26/2013   Procedure: UNSUCCESSFUL NOVASURE ABLATION;  Surgeon: Willodean Rosenthalarolyn Harraway-Smith, MD;  Location: [facility];  Service: Gynecology;  Laterality: N/A;   uterine biopsy     VAGINAL HYSTERECTOMY N/A 06/19/2013   Procedure:  TOTAL VAGINAL HYSTERECTOMY with bilateral salpingectomy;  Surgeon: Willodean Rosenthalarolyn Harraway-Smith, MD;  Location: [facility];  Service: Gynecology;  Laterality: N/A;    Social  History   Socioeconomic History   Marital status: Divorced    Spouse name: Not on file   Number of children: Not on file   Years of education: Not on file   Highest education level: Not on file  Occupational History   Not on file  Tobacco Use   Smoking status: Never   Smokeless tobacco: Never  Vaping Use   Vaping Use: Never used  Substance and Sexual Activity   Alcohol use: Yes    Comment: liquor some days   Drug use: Not Currently   Sexual activity: Yes    Birth control/protection: Surgical  Other Topics Concern   Not on file  Social History Narrative   [place] in [place].  She is unemployed.  She is not followed by a psychiatrist.   Social Determinants of Health   Financial Resource Strain: Not on file  Food Insecurity: No Food Insecurity (12/21/2021)   Hunger Vital Sign    Worried About Running Out of Food in the Last Year: Never true    Ran Out of Food in the Last Year: Never true  Transportation Needs: No Transportation Needs (12/21/2021)   PRAPARE - Administrator, Civil ServiceTransportation    Lack of Transportation (Medical): No    Lack of Transportation (Non-Medical): No  Physical Activity: Not on file  Stress: Not on file  Social Connections: Not on file  Intimate Partner Violence: Not At Risk (12/21/2021)   Humiliation, Afraid, Rape, and Kick questionnaire    Fear of Current or Ex-Partner: No    Emotionally Abused: No    Physically Abused: No    Sexually Abused: No    Family History  Problem Relation Age of Onset   Drug abuse Mother    Arthritis Maternal Aunt    Cancer Maternal Aunt    Drug abuse Maternal Aunt    Arthritis Maternal Uncle    Alcohol abuse Maternal Uncle    Cancer Maternal Uncle    Drug abuse Maternal Uncle    Cancer Maternal Grandmother    Alcohol abuse Maternal Grandfather    Cancer Maternal Grandfather      Review of Systems  Constitutional: Negative.  Negative for chills and fever.  HENT: Negative.  Negative for congestion and sore  throat.   Respiratory: Negative.  Negative for cough and shortness of breath.   Cardiovascular: Negative.  Negative for chest pain and palpitations.  Gastrointestinal: Negative.  Negative for abdominal pain, diarrhea, nausea and vomiting.  Genitourinary: Negative.  Negative for dysuria and hematuria.  Skin: Negative.  Negative for rash.  Neurological: Negative.  Negative for dizziness and headaches.  All other systems reviewed and are negative.   Vitals:   07/24/22 1510  BP: 110/64  Pulse: 60  Temp: 98.5 F (36.9 C)  SpO2: 99%    Physical Exam Vitals reviewed.  Constitutional:      Appearance: Normal appearance.  HENT:     Head: Normocephalic.     Right Ear: Tympanic membrane, ear canal and external ear normal.     Left Ear:  Tympanic membrane, ear canal and external ear normal.     Mouth/Throat:     Mouth: Mucous membranes are moist.     Pharynx: Oropharynx is clear.  Eyes:     Extraocular Movements: Extraocular movements intact.     Conjunctiva/sclera: Conjunctivae normal.     Pupils: Pupils are equal, round, and reactive to light.  Cardiovascular:     Rate and Rhythm: Normal rate and regular rhythm.     Pulses: Normal pulses.     Heart sounds: Normal heart sounds.  Pulmonary:     Effort: Pulmonary effort is normal.     Breath sounds: Normal breath sounds.  Abdominal:     General: Bowel sounds are normal. There is no distension.     Palpations: Abdomen is soft.     Tenderness: There is no abdominal tenderness.  Musculoskeletal:     Cervical back: No tenderness.  Lymphadenopathy:     Cervical: No cervical adenopathy.  Skin:    General: Skin is warm and dry.     Capillary Refill: Capillary refill takes less than 2 seconds.  Neurological:     General: No focal deficit present.     Mental Status: She is alert and oriented to person, place, and time.  Psychiatric:        Mood and Affect: Mood normal.        Behavior: Behavior normal.      ASSESSMENT &  PLAN: Problem List Items Addressed This Visit       Other   History of alcohol abuse (Chronic)   Other Visit Diagnoses     Routine general medical examination at a health care facility    -  Primary   Relevant Orders   CBC with Differential/Platelet   Comprehensive metabolic panel   Lipid panel   TSH   Hemoglobin A1c   Colon cancer screening       Relevant Orders   Ambulatory referral to Gastroenterology   Screening for deficiency anemia       Relevant Orders   CBC with Differential/Platelet   Screening for lipoid disorders       Relevant Orders   Lipid panel   Screening for endocrine, metabolic and immunity disorder       Relevant Orders   Comprehensive metabolic panel   TSH   Hemoglobin A1c   Chronic depression       Relevant Orders   Ambulatory referral to Psychiatry      Modifiable risk factors discussed with patient. Anticipatory guidance according to age provided. The following topics were also discussed: Social Determinants of Health Smoking.  Non-smoker Complications of chronic alcohol abuse Diet and nutrition Benefits of exercise Cancer screening and need for colon cancer screening with colonoscopy Vaccinations review and recommendations Cardiovascular risk assessment and need for blood work Mental health including depression and anxiety and need for psychiatric evaluation for chronic depression Fall and accident prevention  Patient Instructions  Health Maintenance, Female Adopting a healthy lifestyle and getting preventive care are important in promoting health and wellness. Ask your health care provider about: The right schedule for you to have regular tests and exams. Things you can do on your own to prevent diseases and keep yourself healthy. What should I know about diet, weight, and exercise? Eat a healthy diet  Eat a diet that includes plenty of vegetables, fruits, low-fat dairy products, and lean protein. Do not eat a lot of foods that are  high in solid fats, added sugars, or  sodium. Maintain a healthy weight Body mass index (BMI) is used to identify weight problems. It estimates body fat based on height and weight. Your health care provider can help determine your BMI and help you achieve or maintain a healthy weight. Get regular exercise Get regular exercise. This is one of the most important things you can do for your health. Most adults should: Exercise for at least 150 minutes each week. The exercise should increase your heart rate and make you sweat (moderate-intensity exercise). Do strengthening exercises at least twice a week. This is in addition to the moderate-intensity exercise. Spend less time sitting. Even light physical activity can be beneficial. Watch cholesterol and blood lipids Have your blood tested for lipids and cholesterol at 47 years of age, then have this test every 5 years. Have your cholesterol levels checked more often if: Your lipid or cholesterol levels are high. You are older than 47 years of age. You are at high risk for heart disease. What should I know about cancer screening? Depending on your health history and family history, you may need to have cancer screening at various ages. This may include screening for: Breast cancer. Cervical cancer. Colorectal cancer. Skin cancer. Lung cancer. What should I know about heart disease, diabetes, and high blood pressure? Blood pressure and heart disease High blood pressure causes heart disease and increases the risk of stroke. This is more likely to develop in people who have high blood pressure readings or are overweight. Have your blood pressure checked: Every 3-5 years if you are 19-63 years of age. Every year if you are 34 years old or older. Diabetes Have regular diabetes screenings. This checks your fasting blood sugar level. Have the screening done: Once every three years after age 58 if you are at a normal weight and have a low risk for  diabetes. More often and at a younger age if you are overweight or have a high risk for diabetes. What should I know about preventing infection? Hepatitis B If you have a higher risk for hepatitis B, you should be screened for this virus. Talk with your health care provider to find out if you are at risk for hepatitis B infection. Hepatitis C Testing is recommended for: Everyone born from [birth year] through 1965. Anyone with known risk factors for hepatitis C. Sexually transmitted infections (STIs) Get screened for STIs, including gonorrhea and chlamydia, if: You are sexually active and are younger than 47 years of age. You are older than 47 years of age and your health care provider tells you that you are at risk for this type of infection. Your sexual activity has changed since you were last screened, and you are at increased risk for chlamydia or gonorrhea. Ask your health care provider if you are at risk. Ask your health care provider about whether you are at high risk for HIV. Your health care provider may recommend a prescription medicine to help prevent HIV infection. If you choose to take medicine to prevent HIV, you should first get tested for HIV. You should then be tested every 3 months for as long as you are taking the medicine. Pregnancy If you are about to stop having your period (premenopausal) and you may become pregnant, seek counseling before you get pregnant. Take 400 to 800 micrograms (mcg) of folic acid every day if you become pregnant. Ask for birth control (contraception) if you want to prevent pregnancy. Osteoporosis and menopause Osteoporosis is a disease in which the bones lose  minerals and strength with aging. This can result in bone fractures. If you are 47 years old or older, or if you are at risk for osteoporosis and fractures, ask your health care provider if you should: Be screened for bone loss. Take a calcium or vitamin D supplement to lower your risk of  fractures. Be given hormone replacement therapy (HRT) to treat symptoms of menopause. Follow these instructions at home: Alcohol use Do not drink alcohol if: Your health care provider tells you not to drink. You are pregnant, may be pregnant, or are planning to become pregnant. If you drink alcohol: Limit how much you have to: 0-1 drink a day. Know how much alcohol is in your drink. In the [place] one drink equals one 12 oz bottle of beer (355 mL), one 5 oz glass of wine (148 mL), or one 1 oz glass of hard liquor (44 mL). Lifestyle Do not use any products that contain nicotine or tobacco. These products include cigarettes, chewing tobacco, and vaping devices, such as e-cigarettes. If you need help quitting, ask your health care provider. Do not use street drugs. Do not share needles. Ask your health care provider for help if you need support or information about quitting drugs. General instructions Schedule regular health, dental, and eye exams. Stay current with your vaccines. Tell your health care provider if: You often feel depressed. You have ever been abused or do not feel safe at home. Summary Adopting a healthy lifestyle and getting preventive care are important in promoting health and wellness. Follow your health care provider's instructions about healthy diet, exercising, and getting tested or screened for diseases. Follow your health care provider's instructions on monitoring your cholesterol and blood pressure. This information is not intended to replace advice given to you by your health care provider. Make sure you discuss any questions you have with your health care provider. Document Revised: 08/23/2020 Document Reviewed: 08/23/2020 Elsevier Patient Education  2023 Elsevier Inc.      Edwina Barth, MD [facility]

## 2022-08-02 NOTE — ED Provider Notes (Signed)
[place] EMERGENCY DEPARTMENT AT [facility] Provider Note   CSN: [id number] Arrival date & time: 07/14/22  1104    This is a late entry History {Add pertinent medical, surgical, social history, OB history to HPI:1} Chief Complaint  Patient presents with   ETOH Withdrawal   Abdominal Pain    Danielle Reilly is a 47 y.o. female.  HPI 47 year old female presents today complaining of epigastric pain consistent with her prior pancreatitis and gastritis.  She reports that she has been drinking over the past week.  She had not had any alcohol since yesterday normally drinks wine and liquor each day.  Is anxious and somewhat agitated complaining of severe abdominal pain.    Home Medications Prior to Admission medications   Medication Sig Start Date End Date Taking? Authorizing Provider  chlordiazePOXIDE (LIBRIUM) 25 MG capsule  PO TID x 1D, then 25-50mg  PO BID X 1D, then 25-50mg  PO QD X 1D 07/14/22  Yes Long, Arlyss Repress, MD  ondansetron (ZOFRAN-ODT) 4 MG disintegrating tablet Take 1 tablet (4 mg total) by mouth every 8 (eight) hours as needed for nausea or vomiting. 07/14/22  Yes Long, Arlyss Repress, MD  acetaminophen (TYLENOL) 500 MG tablet Take 2 tablets (1,000 mg total) by mouth every 8 (eight) hours as needed for mild pain, fever or headache. 12/24/21 12/24/22  Vassie Loll, MD  b complex vitamins capsule Take 1 capsule by mouth daily.    [provider]  fluticasone (FLONASE) 50 MCG/ACT nasal spray Place 1 spray into both nostrils daily as needed for allergies or rhinitis.    [provider]  folic acid (FOLVITE) 1 MG tablet Take 1 tablet (1 mg total) by mouth daily. 07/24/22   Georgina Quint, MD  Multiple Vitamin (MULTIVITAMINS PO) Take 1 tablet by mouth daily.    [provider]  omeprazole (PRILOSEC) 40 MG capsule Take 1 capsule (40 mg total) by mouth in the morning and at bedtime. 07/24/22   Georgina Quint, MD  thiamine (VITAMIN B-1) 100 MG  tablet Take 1 tablet (100 mg total) by mouth daily. 07/24/22   Georgina Quint, MD  famotidine (PEPCID) 20 MG tablet Take 1 tablet (20 mg total) by mouth daily. Patient not taking: Reported on 10/28/2019 05/19/19 04/06/20  Jacalyn Lefevre, MD      Allergies    Fluconazole and Hydrocodone    Review of Systems   Review of Systems  Physical Exam Updated Vital Signs BP 103/71 (BP Location: Right Arm)   Pulse (!) 119   Temp 98.8 F (37.1 C) (Oral)   Resp 18   LMP 06/19/2013   SpO2 99%  Physical Exam  ED Results / Procedures / Treatments   Labs (all labs ordered are listed, but only abnormal results are displayed) Labs Reviewed  COMPREHENSIVE METABOLIC PANEL - Abnormal; Notable for the following components:      Result Value   Sodium 128 (*)    Chloride 90 (*)    CO2 21 (*)    Glucose, Bld 125 (*)    BUN <5 (*)    Creatinine, Ser 1.16 (*)    AST 230 (*)    ALT 126 (*)    Total Bilirubin 3.7 (*)    GFR, Estimated 59 (*)    Anion gap 17 (*)    All other components within normal limits  CBC - Abnormal; Notable for the following components:   RDW 17.1 (*)    All other components within normal  limits  RAPID URINE DRUG SCREEN, HOSP PERFORMED - Abnormal; Notable for the following components:   Benzodiazepines POSITIVE (*)    All other components within normal limits  ETHANOL  LIPASE, BLOOD  I-STAT BETA HCG BLOOD, ED ([facility], [facility], [facility] ONLY)    EKG EKG Interpretation  Date/Time:  Friday July 14 2022 11:50:49 EDT Ventricular Rate:  122 PR Interval:  83 QRS Duration: 83 QT Interval:  304 QTC Calculation: 433 R Axis:   65 Text Interpretation: Sinus tachycardia Ventricular premature complex Repol abnrm suggests ischemia, anterolateral Baseline wander in lead(s) V6 When compared with ECG of EARLIER SAME DATE HEART RATE has decreased Confirmed by Dione Booze ([id number]) on 07/14/2022 11:25:06 PM  Radiology No results found.  Procedures Procedures  {Document cardiac monitor,  telemetry assessment procedure when appropriate:1}  Medications Ordered in ED Medications  lactated ringers bolus 1,000 mL (0 mLs Intravenous Stopped 07/14/22 1340)  LORazepam (ATIVAN) injection 2 mg (2 mg Intravenous Given 07/14/22 1232)  fentaNYL (SUBLIMAZE) injection 50 mcg (50 mcg Intravenous Given 07/14/22 1408)  LORazepam (ATIVAN) injection 2 mg (2 mg Intravenous Given 07/14/22 1614)  ondansetron (ZOFRAN) injection 4 mg (4 mg Intravenous Given 07/14/22 1615)  lactated ringers bolus 500 mL (0 mLs Intravenous Stopped 07/14/22 1751)  morphine (PF) 2 MG/ML injection 2 mg (2 mg Intravenous Given 07/14/22 1615)  pantoprazole (PROTONIX) 80 mg /NS 100 mL IVPB (0 mg Intravenous Stopped 07/14/22 1751)  iohexol (OMNIPAQUE) 350 MG/ML injection 75 mL (75 mLs Intravenous Contrast Given 07/14/22 2229)    ED Course/ Medical Decision Making/ A&P   {   Click here for ABCD2, HEART and other calculatorsREFRESH Note before signing :1}                          Medical Decision Making Amount and/or Complexity of Data Reviewed Labs: ordered. Radiology: ordered.  Risk Prescription drug management.   ***  {Document critical care time when appropriate:1} {Document review of labs and clinical decision tools ie heart score, Chads2Vasc2 etc:1}  {Document your independent review of radiology images, and any outside records:1} {Document your discussion with family members, caretakers, and with consultants:1} {Document social determinants of health affecting pt's care:1} {Document your decision making why or why not admission, treatments were needed:1} Final Clinical Impression(s) / ED Diagnoses Final diagnoses:  Epigastric pain  Alcohol abuse  Elevated LFTs    Rx / DC Orders ED Discharge Orders          Ordered    ondansetron (ZOFRAN-ODT) 4 MG disintegrating tablet  Every 8 hours PRN        07/14/22 2322    pantoprazole (PROTONIX) 40 MG tablet  Daily,   Status:  Discontinued        07/14/22 2322     chlordiazePOXIDE (LIBRIUM) 25 MG capsule        07/14/22 2322

## 2022-11-10 ENCOUNTER — Encounter: Payer: Self-pay | Admitting: Internal Medicine

## 2022-11-10 ENCOUNTER — Ambulatory Visit: Payer: Medicaid Other | Admitting: Internal Medicine

## 2023-01-03 ENCOUNTER — Ambulatory Visit (AMBULATORY_SURGERY_CENTER): Payer: Medicaid Other | Admitting: *Deleted

## 2023-01-03 VITALS — Ht 66.0 in | Wt 180.0 lb

## 2023-01-03 DIAGNOSIS — Z1211 Encounter for screening for malignant neoplasm of colon: Secondary | ICD-10-CM

## 2023-01-03 MED ORDER — GOLYTELY 236 G PO SOLR: 4000.0000 mL | Freq: Once | ORAL | 0 refills | Status: AC

## 2023-01-03 NOTE — Progress Notes (Signed)
No egg or soy allergy known to patient  No issues known to pt with past sedation with any surgeries or procedures Patient denies ever being told they had issues or difficulty with intubation  No FH of Malignant Hyperthermia Pt is not on diet pills Pt is not on  home 02  Pt is not on blood thinners  Pt denies issues with constipation  No A fib or A flutter Have any cardiac testing pending--NO  Pt instructed to use Singlecare.com or GoodRx for a price reduction on prep   Patient's chart reviewed by Cathlyn Parsons CNRA prior to previsit and patient appropriate for the [facility].  Previsit completed and red dot placed by patient's name on their procedure day (on provider's schedule).

## 2023-01-23 ENCOUNTER — Encounter (HOSPITAL_COMMUNITY): Payer: Self-pay

## 2023-01-23 ENCOUNTER — Other Ambulatory Visit: Payer: Self-pay

## 2023-01-23 ENCOUNTER — Emergency Department (HOSPITAL_COMMUNITY)
Admission: EM | Admit: 2023-01-23 | Discharge: 2023-01-24 | Disposition: A | Discharge status: Other Acute Inpatient Hospital | Payer: Medicaid Other | Attending: Emergency Medicine | Admitting: Emergency Medicine

## 2023-01-23 DIAGNOSIS — F1010 Alcohol abuse, uncomplicated: Secondary | ICD-10-CM | POA: Diagnosis present

## 2023-01-23 DIAGNOSIS — F10188 Alcohol abuse with other alcohol-induced disorder: Secondary | ICD-10-CM | POA: Diagnosis present

## 2023-01-23 DIAGNOSIS — F4312 Post-traumatic stress disorder, chronic: Secondary | ICD-10-CM | POA: Diagnosis present

## 2023-01-23 DIAGNOSIS — Y908 Blood alcohol level of 240 mg/100 ml or more: Secondary | ICD-10-CM | POA: Diagnosis not present

## 2023-01-23 DIAGNOSIS — F332 Major depressive disorder, recurrent severe without psychotic features: Secondary | ICD-10-CM | POA: Insufficient documentation

## 2023-01-23 DIAGNOSIS — F322 Major depressive disorder, single episode, severe without psychotic features: Secondary | ICD-10-CM | POA: Diagnosis present

## 2023-01-23 DIAGNOSIS — F1021 Alcohol dependence, in remission: Secondary | ICD-10-CM

## 2023-01-23 LAB — COMPREHENSIVE METABOLIC PANEL
ALT: 26 U/L (ref 0–44)
AST: 37 U/L (ref 15–41)
Albumin: 4.7 g/dL (ref 3.5–5.0)
Alkaline Phosphatase: 80 U/L (ref 38–126)
Anion gap: 15 (ref 5–15)
BUN: 7 mg/dL (ref 6–20)
CO2: 21 mmol/L — ABNORMAL LOW (ref 22–32)
Calcium: 8.9 mg/dL (ref 8.9–10.3)
Chloride: 105 mmol/L (ref 98–111)
Creatinine, Ser: 0.82 mg/dL (ref 0.44–1.00)
GFR, Estimated: >60 mL/min (ref >60)
Glucose, Bld: 91 mg/dL (ref 70–99)
Potassium: 3.5 mmol/L (ref 3.5–5.1)
Sodium: 141 mmol/L (ref 135–145)
Total Bilirubin: 0.8 mg/dL (ref 0.3–1.2)
Total Protein: 8.4 g/dL — ABNORMAL HIGH (ref 6.5–8.1)

## 2023-01-23 LAB — CBC WITH DIFFERENTIAL/PLATELET
Abs Immature Granulocytes: 0 10*3/uL (ref 0.00–0.07)
Basophils Absolute: 0.1 10*3/uL (ref 0.0–0.1)
Basophils Relative: 2 %
Eosinophils Absolute: 0 10*3/uL (ref 0.0–0.5)
Eosinophils Relative: 0 %
HCT: 40.9 % (ref 36.0–46.0)
Hemoglobin: 13.5 g/dL (ref 12.0–15.0)
Immature Granulocytes: 0 %
Lymphocytes Relative: 50 %
Lymphs Abs: 2.2 10*3/uL (ref 0.7–4.0)
MCH: 28.1 pg (ref 26.0–34.0)
MCHC: 33 g/dL (ref 30.0–36.0)
MCV: 85 fL (ref 80.0–100.0)
Monocytes Absolute: 0.2 10*3/uL (ref 0.1–1.0)
Monocytes Relative: 4 %
Neutro Abs: 1.9 10*3/uL (ref 1.7–7.7)
Neutrophils Relative %: 44 %
Platelets: 263 10*3/uL (ref 150–400)
RBC: 4.81 MIL/uL (ref 3.87–5.11)
RDW: 15.6 % — ABNORMAL HIGH (ref 11.5–15.5)
WBC: 4.4 10*3/uL (ref 4.0–10.5)
nRBC: 0 % (ref 0.0–0.2)

## 2023-01-23 LAB — URINALYSIS, ROUTINE W REFLEX MICROSCOPIC
Bilirubin Urine: NEGATIVE
Glucose, UA: NEGATIVE mg/dL
Ketones, ur: NEGATIVE mg/dL
Leukocytes,Ua: NEGATIVE
Nitrite: NEGATIVE
Protein, ur: NEGATIVE mg/dL
Specific Gravity, Urine: 1.005 (ref 1.005–1.030)
pH: 6 (ref 5.0–8.0)

## 2023-01-23 LAB — RAPID URINE DRUG SCREEN, HOSP PERFORMED
Amphetamines: NOT DETECTED
Barbiturates: NOT DETECTED
Benzodiazepines: POSITIVE — AB
Cocaine: NOT DETECTED
Opiates: NOT DETECTED
Tetrahydrocannabinol: NOT DETECTED

## 2023-01-23 LAB — ETHANOL: Alcohol, Ethyl (B): 332 mg/dL (ref <10)

## 2023-01-23 LAB — HCG, QUANTITATIVE, PREGNANCY: hCG, Beta Chain, Quant, S: 1 m[IU]/mL (ref <5)

## 2023-01-23 MED ORDER — LORAZEPAM 2 MG/ML IJ SOLN: 0.0000 mg | Freq: Two times a day (BID) | INTRAMUSCULAR | Status: DC

## 2023-01-23 MED ORDER — LORAZEPAM 1 MG PO TABS
0.0000 mg | ORAL_TABLET | Freq: Four times a day (QID) | ORAL | Status: DC
  Administered 2023-01-23 (×2): 1 mg via ORAL
  Filled 2023-01-23: qty 1
  Filled 2023-01-23: qty 2

## 2023-01-23 MED ORDER — LORAZEPAM 2 MG/ML IJ SOLN: 0.0000 mg | Freq: Four times a day (QID) | INTRAMUSCULAR | Status: DC

## 2023-01-23 MED ORDER — THIAMINE MONONITRATE 100 MG PO TABS
100.0000 mg | ORAL_TABLET | Freq: Every day | ORAL | Status: DC
  Administered 2023-01-23: 100 mg via ORAL
  Filled 2023-01-23: qty 1

## 2023-01-23 MED ORDER — LORAZEPAM 1 MG PO TABS: 0.0000 mg | ORAL_TABLET | Freq: Two times a day (BID) | ORAL | Status: DC

## 2023-01-23 MED ORDER — ONDANSETRON 4 MG PO TBDP
4.0000 mg | ORAL_TABLET | Freq: Once | ORAL | Status: AC
  Administered 2023-01-23: 4 mg via ORAL
  Filled 2023-01-23: qty 1

## 2023-01-23 MED ORDER — ZOLPIDEM TARTRATE 5 MG PO TABS
5.0000 mg | ORAL_TABLET | Freq: Every evening | ORAL | Status: DC | PRN
  Administered 2023-01-23: 5 mg via ORAL
  Filled 2023-01-23: qty 1

## 2023-01-23 MED ORDER — ACETAMINOPHEN 325 MG PO TABS
650.0000 mg | ORAL_TABLET | ORAL | Status: DC | PRN
  Administered 2023-01-23: 650 mg via ORAL
  Filled 2023-01-23: qty 2

## 2023-01-23 MED ORDER — ALUM & MAG HYDROXIDE-SIMETH 200-200-20 MG/5ML PO SUSP: 30.0000 mL | Freq: Four times a day (QID) | ORAL | Status: DC | PRN

## 2023-01-23 MED ORDER — THIAMINE HCL 100 MG/ML IJ SOLN: 100.0000 mg | Freq: Every day | INTRAMUSCULAR | Status: DC

## 2023-01-23 NOTE — ED Notes (Signed)
[facility] called pts son Fayrene Fearing) for collateral. Pts son said pt had been in her room binge drinking for a few days. Pts son was called by pts friend yesterday when he was at work after speaking with pt who was incoherent. Pts son and pts friend agreed to call EMS to have pt brought to the hospital.  Pts son reports a history of childhood trauma that pt has experienced. Pts son has been protected by his mother by not revealing details of her trauma. Pt experienced the loss of both of her parents within a short time between 2020 and 2022. Pts son feels that there were unresolved issues between pt and her mother. Pts son says that his mother has not finished grieving the loss of her parents. Pt also has a difficult relationship with her sister due to an unhealthy family dynamic.   Pts son said that his mothers drinking has taken a toll on him. Since 2022 pts drinking has worsened and she will binge drink for days at a time in her room, not leaving the house expect to buy alcohol. Pts son feel pt is angry and still struggling with trauma from her childhood. Pts son wanted [facility] to make sure pt eats and stays hydrated because she does not take care of herself at home when she is drinking.   Jacquelynn Cree, [facility] 01/23/23
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
## 2023-01-23 NOTE — ED Triage Notes (Signed)
Pt reports she was clean from alcohol use for 175 days, relapsed yesterday and she is upset with herself and disappointed. This is making her depressed. Denies SI/HI. Pt is tearful in triage. No withdrawal sx.
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
## 2023-01-23 NOTE — Progress Notes (Signed)
01/23/2023  1457 Ethanol greater than 300 per lab

## 2023-01-23 NOTE — Progress Notes (Signed)
Date and time results received: 01/23/23 1500  Test: Alcohol  Critical Value: 332  Name of Provider Notified: Lab  Orders Received? Or Actions Taken?:  yes

## 2023-01-23 NOTE — Progress Notes (Addendum)
Pt was accepted to  [facility]) Address: [address] [place], [place] [address] TODAY 01/23/2023; Bed Assignment Facility Based Crisis(FBC)  Pt meets inpatient criteria per Earney Navy, NP-PMHNP-BC   -Per psych provider at [facility] pt will be accepted to [facility].  Attending Physician will be Dr. Wilmer Floor, MD   Report can be called to: - [phone]  Pt can arrive after: BED IS READY   Care Team notified Night [facility] [address] Lavonna Monarch, Chinwendu Royston Bake, NP, Earney Navy, NP-PMHNP-BC, Artie Leland [place], [place] [facility] Tresa Endo [place],RN, Velna Hatchet Maurer,MD, Shuvon Clayton, [facility] Lynnea Maizes, Margely Carrion-Carrero,MD, Olive Cunningham,NT, [place], LCSW   [place], [place] 01/23/2023 @ 11:58 PM

## 2023-01-24 ENCOUNTER — Other Ambulatory Visit (HOSPITAL_COMMUNITY)
Admission: EM | Admit: 2023-01-24 | Discharge: 2023-01-27 | Disposition: A | Discharge status: Home / Self Care | Payer: Medicaid Other | Attending: Psychiatry | Admitting: Psychiatry

## 2023-01-24 DIAGNOSIS — F4310 Post-traumatic stress disorder, unspecified: Secondary | ICD-10-CM | POA: Insufficient documentation

## 2023-01-24 DIAGNOSIS — F10230 Alcohol dependence with withdrawal, uncomplicated: Secondary | ICD-10-CM | POA: Diagnosis not present

## 2023-01-24 DIAGNOSIS — F1021 Alcohol dependence, in remission: Secondary | ICD-10-CM

## 2023-01-24 DIAGNOSIS — F411 Generalized anxiety disorder: Secondary | ICD-10-CM | POA: Insufficient documentation

## 2023-01-24 DIAGNOSIS — F1020 Alcohol dependence, uncomplicated: Secondary | ICD-10-CM

## 2023-01-24 DIAGNOSIS — F10930 Alcohol use, unspecified with withdrawal, uncomplicated: Secondary | ICD-10-CM

## 2023-01-24 DIAGNOSIS — F332 Major depressive disorder, recurrent severe without psychotic features: Secondary | ICD-10-CM | POA: Diagnosis not present

## 2023-01-24 DIAGNOSIS — R45851 Suicidal ideations: Secondary | ICD-10-CM | POA: Insufficient documentation

## 2023-01-24 DIAGNOSIS — F10229 Alcohol dependence with intoxication, unspecified: Secondary | ICD-10-CM | POA: Insufficient documentation

## 2023-01-24 DIAGNOSIS — Z79899 Other long term (current) drug therapy: Secondary | ICD-10-CM | POA: Insufficient documentation

## 2023-01-24 MED ORDER — THIAMINE MONONITRATE 100 MG PO TABS
100.0000 mg | ORAL_TABLET | Freq: Every day | ORAL | Status: DC
  Administered 2023-01-25 – 2023-01-27 (×3): 100 mg via ORAL
  Filled 2023-01-24 (×3): qty 1

## 2023-01-24 MED ORDER — MIRTAZAPINE 7.5 MG PO TABS
7.5000 mg | ORAL_TABLET | Freq: Every day | ORAL | Status: DC
  Administered 2023-01-24 – 2023-01-26 (×3): 7.5 mg via ORAL
  Filled 2023-01-24 (×3): qty 1

## 2023-01-24 MED ORDER — LORAZEPAM 1 MG PO TABS
1.0000 mg | ORAL_TABLET | Freq: Four times a day (QID) | ORAL | Status: AC
  Administered 2023-01-24 (×4): 1 mg via ORAL
  Filled 2023-01-24 (×4): qty 1

## 2023-01-24 MED ORDER — MAGNESIUM HYDROXIDE 400 MG/5ML PO SUSP
15.0000 mL | Freq: Every day | ORAL | Status: DC | PRN
  Administered 2023-01-26: 15 mL via ORAL
  Filled 2023-01-24: qty 30

## 2023-01-24 MED ORDER — MIRTAZAPINE 15 MG PO TABS: 15.0000 mg | ORAL_TABLET | Freq: Every day | ORAL | Status: DC

## 2023-01-24 MED ORDER — LORAZEPAM 1 MG PO TABS: 1.0000 mg | ORAL_TABLET | Freq: Four times a day (QID) | ORAL | Status: AC | PRN

## 2023-01-24 MED ORDER — HYDROXYZINE HCL 25 MG PO TABS
25.0000 mg | ORAL_TABLET | Freq: Four times a day (QID) | ORAL | Status: AC | PRN
  Administered 2023-01-24 – 2023-01-26 (×2): 25 mg via ORAL
  Filled 2023-01-24 (×2): qty 1

## 2023-01-24 MED ORDER — PANTOPRAZOLE SODIUM 40 MG PO TBEC
80.0000 mg | DELAYED_RELEASE_TABLET | Freq: Every day | ORAL | Status: DC
  Administered 2023-01-24 – 2023-01-25 (×2): 80 mg via ORAL
  Filled 2023-01-24 (×2): qty 2

## 2023-01-24 MED ORDER — LORAZEPAM 1 MG PO TABS
1.0000 mg | ORAL_TABLET | Freq: Every day | ORAL | Status: AC
  Administered 2023-01-27: 1 mg via ORAL
  Filled 2023-01-24: qty 1

## 2023-01-24 MED ORDER — ONDANSETRON 4 MG PO TBDP
4.0000 mg | ORAL_TABLET | Freq: Four times a day (QID) | ORAL | Status: AC | PRN
  Administered 2023-01-24: 4 mg via ORAL
  Filled 2023-01-24: qty 1

## 2023-01-24 MED ORDER — ADULT MULTIVITAMIN W/MINERALS CH
1.0000 | ORAL_TABLET | Freq: Every day | ORAL | Status: DC
  Administered 2023-01-24 – 2023-01-27 (×4): 1 via ORAL
  Filled 2023-01-24 (×4): qty 1

## 2023-01-24 MED ORDER — LORAZEPAM 1 MG PO TABS
1.0000 mg | ORAL_TABLET | Freq: Three times a day (TID) | ORAL | Status: AC
  Administered 2023-01-25 (×3): 1 mg via ORAL
  Filled 2023-01-24 (×3): qty 1

## 2023-01-24 MED ORDER — LOPERAMIDE HCL 2 MG PO CAPS: 2.0000 mg | ORAL_CAPSULE | ORAL | Status: AC | PRN

## 2023-01-24 MED ORDER — NALTREXONE HCL 50 MG PO TABS
25.0000 mg | ORAL_TABLET | Freq: Every day | ORAL | Status: DC
  Administered 2023-01-24 – 2023-01-27 (×4): 25 mg via ORAL
  Filled 2023-01-24 (×4): qty 1

## 2023-01-24 MED ORDER — PANTOPRAZOLE SODIUM 40 MG PO TBEC: 80.0000 mg | DELAYED_RELEASE_TABLET | Freq: Every day | ORAL | Status: DC

## 2023-01-24 MED ORDER — THIAMINE HCL 100 MG/ML IJ SOLN
100.0000 mg | Freq: Once | INTRAMUSCULAR | Status: AC
  Administered 2023-01-24: 100 mg via INTRAMUSCULAR
  Filled 2023-01-24: qty 2

## 2023-01-24 MED ORDER — ALUM & MAG HYDROXIDE-SIMETH 200-200-20 MG/5ML PO SUSP
15.0000 mL | ORAL | Status: DC | PRN
  Administered 2023-01-24: 15 mL via ORAL
  Filled 2023-01-24: qty 30

## 2023-01-24 MED ORDER — LORAZEPAM 1 MG PO TABS
1.0000 mg | ORAL_TABLET | Freq: Two times a day (BID) | ORAL | Status: AC
  Administered 2023-01-26 (×2): 1 mg via ORAL
  Filled 2023-01-24 (×2): qty 1

## 2023-01-24 NOTE — ED Notes (Signed)
Patient is alert and oriented X 4. She denies SI/HI or AVH. Patient denies any physical pain. She does endorse anxiety. Medication provided. Patient was seen interacting with staff and peers appropriately. Safety checks conducted according to facility protocol. Safety maintained. Will continue to monitor.

## 2023-01-24 NOTE — Group Note (Signed)
Group Topic: Relapse and Recovery  Group Date: 01/24/2023 Start Time: 0730 End Time: 0800 Facilitators: Debe Coder, NT  Department: [facility]  Number of Participants: 1  Group Focus: self-awareness Treatment Modality:  Individual Therapy Interventions utilized were support Purpose: enhance coping skills  Name: Danielle Reilly Date of Birth: 25-Jun-1975  MR: [id number]    Level of Participation: did not attend group Quality of Participation: did not attend group Interactions with others:  Mood/Affect:  Triggers (if applicable):  Cognition:  Progress:  Response:  Plan:   Patients Problems:  Patient Active Problem List   Diagnosis Date Noted   Alcohol intoxication in relapsed alcoholic (HCC) 01/24/2023   Alcohol abuse with other alcohol-induced disorder (HCC) 01/23/2023   Chronic post-traumatic stress disorder (PTSD) 01/23/2023   Major depressive disorder, single episode, severe without psychotic features (HCC) 01/23/2023   Generalized anxiety disorder 12/06/2020   History of gastritis 12/06/2020   History of alcohol abuse 12/06/2020   GERD (gastroesophageal reflux disease) 06/17/2019

## 2023-01-24 NOTE — Group Note (Signed)
Group Topic: Fears and Unhealthy Coping Skills  Group Date: 01/24/2023 Start Time: 1400 End Time: 1430 Facilitators: Olin Hauser, RN  Department: [facility]  Number of Participants: 1  Group Focus: feeling awareness/expression Treatment Modality:  Solution-Focused Therapy Interventions utilized were exploration Purpose: express feelings  Name: Danielle Reilly Date of Birth: May 04, 1975  MR: [id number]    Level of Participation: active Quality of Participation: cooperative Interactions with others: gave feedback Mood/Affect: appropriate Triggers (if applicable): None Cognition: fearful Progress: Minimal Response: Patient accepting of idea to give herself grace Plan: patient will be encouraged to give herself grace.  Patients Problems:  Patient Active Problem List   Diagnosis Date Noted   Alcohol intoxication in relapsed alcoholic (HCC) 01/24/2023   Alcohol abuse with other alcohol-induced disorder (HCC) 01/23/2023   Chronic post-traumatic stress disorder (PTSD) 01/23/2023   Major depressive disorder, single episode, severe without psychotic features (HCC) 01/23/2023   Generalized anxiety disorder 12/06/2020   History of gastritis 12/06/2020   History of alcohol abuse 12/06/2020   GERD (gastroesophageal reflux disease) 06/17/2019

## 2023-01-24 NOTE — ED Notes (Signed)
Patient is sleeping. Respirations equal and unlabored, skin warm and dry. No change in assessment or acuity. Routine safety checks conducted according to facility protocol. Will continue to monitor for safety.   

## 2023-01-24 NOTE — ED Notes (Signed)
Patient observed resting quietly, eyes closed. Respirations equal and unlabored. Will continue to monitor for safety.  
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
## 2023-01-24 NOTE — ED Notes (Signed)
 Patient in the bedroom sleeping. NAD.  Respirations are even and unlabored. Will continue to monitor for safety.

## 2023-01-24 NOTE — ED Notes (Signed)
Patient in the bedroom sleeping quietly with her closed. NAD. Respirations are even and unlabored. Will continue to monitor for safety.
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
## 2023-01-24 NOTE — ED Notes (Signed)
Patient complains of right chest/shoulder pain 10/10. Worse with movements, no pain without. Started before lunch. No pain medication ordered. Provider made aware.

## 2023-01-24 NOTE — ED Provider Notes (Signed)
Facility Based Crisis Admission H&P  Date: 01/24/23 Patient Name: Danielle Reilly MRN: [id number] Chief Complaint: " alcohol detox"  Diagnoses:  Final diagnoses:  Alcohol intoxication in relapsed alcoholic [facility])    HPI: Tami Blass is a 47 year old female with psychiatric history of depression, anxiety and substance abuse, who presented voluntarily to [facility] for alcohol detox as a direct transfer from [facility], where she presented yesterday upset/tearful, seeking detox due to relapsing after 175 days sober.  Patient was seen face to face by this provider and chart reviewed.   On evaluation, patient is alert, oriented x 3, and cooperative. Speech is clear, slow and coherent.. Pt appears dressed in hospital scrubs. Eye contact is  minimal. Mood is depressed, affect is congruent with mood. Thought process is goal directed and thought content is WDL. Pt denies SI/HI/AVH. There is no objective indication that the patient is responding to internal stimuli. No delusions elicited during this assessment.   On approach, patient is resting her head on the table with her eyes closed. MAR from the ED shows she was given Hs Ambien.  Patient is able to sit up, she is awake and alert, but minimally contributes verbally during this evaluation. She reports going to the ED yesterday seeking alcohol detox after relapsing from being sober for 175 days.    She is unable to identify any stressors or triggers. . She denies other illicit substance use. She lives with her son, and denies any ongoing mental health problems or concerns.   Patient reports histroy of prior alcohol rehab/detox. She denies histroy of withdrawal seizures.  Endorses histroy of withdrawals which she describes as  "anxious, nauseous, can't keep still, and I sometimes get the shakes".  She denies current withdrawal symptoms.   Patient completed the PHQ-9 questionnaire and obtained a total score of 20, indicating  severe depression.  Patient is not  established with an outpatient psychiatric provider for med management or therapy.   Support, encouragement and reassurance provided about ongoing stressors. Patient is provided with opportunity for questions.   PHQ 2-9:  Flowsheet Row ED from 01/24/2023 in [facility] Visit from 07/02/2019 in [facility]  Thoughts that you would be better off dead, or of hurting yourself in some way More than half the days Not at all  PHQ-9 Total Score 20 0       Flowsheet Row ED from 01/24/2023 in [facility] ED from 01/23/2023 in [facility] Emergency Department at [facility] ED from 07/14/2022 in [facility] Emergency Department at [facility]  C-SSRS RISK CATEGORY No Risk No Risk No Risk         Total Time spent with patient: 20 minutes  Musculoskeletal  Strength & Muscle Tone: within normal limits Gait & Station: normal Patient leans: N/A  Psychiatric Specialty Exam  Presentation General Appearance:  Other (comment) (in hospital scrubs)  Eye Contact: Minimal  Speech: Slow; Clear and Coherent  Speech Volume: Decreased  Handedness: Right   Mood and Affect  Mood: Depressed  Affect: Congruent   Thought Process  Thought Processes: Coherent  Descriptions of Associations:Intact  Orientation:Full (Time, Place and Person)  Thought Content:WDL    Hallucinations:Hallucinations: None  Ideas of Reference:None  Suicidal Thoughts:Suicidal Thoughts: No  Homicidal Thoughts:Homicidal Thoughts: No   Sensorium  Memory: Immediate Fair  Judgment: Poor  Insight: Poor   Executive Functions  Concentration: Fair  Attention Span: Fair  Recall:  Fair  Fund of Knowledge: Fair  Language: Fair   Psychomotor Activity  Psychomotor Activity: Psychomotor Activity: Normal   Assets  Assets: Manufacturing systems engineer; Desire for Improvement   Sleep   Sleep: Sleep: Poor   Nutritional Assessment (For OBS and FBC admissions only) Has the patient had a weight loss or gain of 10 pounds or more in the last 3 months?: No Has the patient had a decrease in food intake/or appetite?: No Does the patient have dental problems?: No Does the patient have eating habits or behaviors that may be indicators of an eating disorder including binging or inducing vomiting?: No Has the patient recently lost weight without trying?: 0 Has the patient been eating poorly because of a decreased appetite?: 0 Malnutrition Screening Tool Score: 0    Physical Exam Constitutional:      General: She is not in acute distress.    Appearance: She is not diaphoretic.  HENT:     Right Ear: External ear normal.     Left Ear: External ear normal.     Nose: No congestion.  Eyes:     General:        Right eye: No discharge.        Left eye: No discharge.  Pulmonary:     Effort: No respiratory distress.  Chest:     Chest wall: No tenderness.  Neurological:     Mental Status: She is alert and oriented to person, place, and time.  Psychiatric:        Attention and Perception: Attention and perception normal.        Mood and Affect: Mood is depressed. Affect is flat.        Speech: Speech normal.        Behavior: Behavior is cooperative.        Thought Content: Thought content normal. Thought content is not paranoid or delusional. Thought content does not include homicidal or suicidal ideation. Thought content does not include homicidal or suicidal plan.    Review of Systems  Constitutional:  Negative for chills, diaphoresis and fever.  HENT:  Negative for congestion.   Eyes:  Negative for discharge.  Respiratory:  Negative for cough, shortness of breath and wheezing.   Cardiovascular:  Negative for chest pain and palpitations.  Gastrointestinal:  Negative for diarrhea, nausea and vomiting.  Neurological:  Negative for dizziness, seizures, loss of consciousness  and headaches.  Psychiatric/Behavioral:  Positive for depression and substance abuse.     Blood pressure 121/77, pulse 100, temperature 98.5 F (36.9 C), temperature source Oral, resp. rate 18, last menstrual period 06/19/2013, SpO2 96%. There is no height or weight on file to calculate BMI.  Past Psychiatric History: See H & P   Is the patient at risk to self? No  Has the patient been a risk to self in the past 6 months? No .    Has the patient been a risk to self within the distant past?  UTD   Is the patient a risk to others? No   Has the patient been a risk to others in the past 6 months? No   Has the patient been a risk to others within the distant past? No   Past Medical History: See Chart Family History: N/A Social History: N/A  Last Labs:  Admission on 01/23/2023, Discharged on 01/24/2023  Component Date Value Ref Range Status   Sodium 01/23/2023 141  135 - 145 mmol/L Final   Potassium 01/23/2023 3.5  3.5 -  5.1 mmol/L Final   Chloride 01/23/2023 105  98 - 111 mmol/L Final   CO2 01/23/2023 21 (L)  22 - 32 mmol/L Final   Glucose, Bld 01/23/2023 91  70 - 99 mg/dL Final   Glucose reference range applies only to samples taken after fasting for at least 8 hours.   BUN 01/23/2023 7  6 - 20 mg/dL Final   Creatinine, Ser 01/23/2023 0.82  0.44 - 1.00 mg/dL Final   Calcium 11/91/4782 8.9  8.9 - 10.3 mg/dL Final   Total Protein 95/62/1308 8.4 (H)  6.5 - 8.1 g/dL Final   Albumin 65/78/4696 4.7  3.5 - 5.0 g/dL Final   AST 29/52/8413 37  15 - 41 U/L Final   ALT 01/23/2023 26  0 - 44 U/L Final   Alkaline Phosphatase 01/23/2023 80  38 - 126 U/L Final   Total Bilirubin 01/23/2023 0.8  0.3 - 1.2 mg/dL Final   GFR, Estimated 01/23/2023 >60  >60 mL/min Final   Comment: (NOTE) Calculated using the CKD-EPI Creatinine Equation (2021)    Anion gap 01/23/2023 15  5 - 15 Final   Performed at [facility], [address] [place], [place] [address]   Alcohol, Ethyl (B)  01/23/2023 332 (HH)  <10 mg/dL Final   Comment: CRITICAL RESULT CALLED TO, READ BACK BY AND VERIFIED WITH CARPENTER, D. RN AT 1457 ON 01/23/23. FA (NOTE) Lowest detectable limit for serum alcohol is 10 mg/dL.  For medical purposes only. Performed at [facility], [address] [place], [place] [address]    WBC 01/23/2023 4.4  4.0 - 10.5 K/uL Final   RBC 01/23/2023 4.81  3.87 - 5.11 MIL/uL Final   Hemoglobin 01/23/2023 13.5  12.0 - 15.0 g/dL Final   HCT 36/64/4034 40.9  36.0 - 46.0 % Final   MCV 01/23/2023 85.0  80.0 - 100.0 fL Final   MCH 01/23/2023 28.1  26.0 - 34.0 pg Final   MCHC 01/23/2023 33.0  30.0 - 36.0 g/dL Final   RDW 74/25/9563 15.6 (H)  11.5 - 15.5 % Final   Platelets 01/23/2023 263  150 - 400 K/uL Final   nRBC 01/23/2023 0.0  0.0 - 0.2 % Final   Neutrophils Relative % 01/23/2023 44  % Final   Neutro Abs 01/23/2023 1.9  1.7 - 7.7 K/uL Final   Lymphocytes Relative 01/23/2023 50  % Final   Lymphs Abs 01/23/2023 2.2  0.7 - 4.0 K/uL Final   Monocytes Relative 01/23/2023 4  % Final   Monocytes Absolute 01/23/2023 0.2  0.1 - 1.0 K/uL Final   Eosinophils Relative 01/23/2023 0  % Final   Eosinophils Absolute 01/23/2023 0.0  0.0 - 0.5 K/uL Final   Basophils Relative 01/23/2023 2  % Final   Basophils Absolute 01/23/2023 0.1  0.0 - 0.1 K/uL Final   Immature Granulocytes 01/23/2023 0  % Final   Abs Immature Granulocytes 01/23/2023 0.00  0.00 - 0.07 K/uL Final   Performed at [facility], [address], [place], [place] [address]   hCG, Conley Rolls, Quant, S 01/23/2023 1  <5 mIU/mL Final   Comment:          GEST. AGE      CONC.  (mIU/mL)   <=1 WEEK        5 - 50     2 WEEKS       50 - 500     3 WEEKS  100 - 10,000     4 WEEKS     1,000 - 30,000     5 WEEKS     3,500 - 115,000   6-8 WEEKS     12,000 - 270,000    12 WEEKS     15,000 - 220,000        FEMALE AND NON-PREGNANT FEMALE:     LESS THAN 5 mIU/mL Performed at [facility], [address], [place], [place] [address]    Color, Urine 01/23/2023 STRAW (A)  YELLOW Final   APPearance 01/23/2023 CLEAR  CLEAR Final   Specific Gravity, Urine 01/23/2023 1.005  1.005 - 1.030 Final   pH 01/23/2023 6.0  5.0 - 8.0 Final   Glucose, UA 01/23/2023 NEGATIVE  NEGATIVE mg/dL Final   Hgb urine dipstick 01/23/2023 SMALL (A)  NEGATIVE Final   Bilirubin Urine 01/23/2023 NEGATIVE  NEGATIVE Final   Ketones, ur 01/23/2023 NEGATIVE  NEGATIVE mg/dL Final   Protein, ur 13/24/4010 NEGATIVE  NEGATIVE mg/dL Final   Nitrite 27/25/3664 NEGATIVE  NEGATIVE Final   Leukocytes,Ua 01/23/2023 NEGATIVE  NEGATIVE Final   RBC / HPF 01/23/2023 0-5  0 - 5 RBC/hpf Final   WBC, UA 01/23/2023 0-5  0 - 5 WBC/hpf Final   Bacteria, UA 01/23/2023 RARE (A)  NONE SEEN Final   Squamous Epithelial / HPF 01/23/2023 0-5  0 - 5 /HPF Final   Performed at [facility], [address], [place], [place] [address]   Opiates 01/23/2023 NONE DETECTED  NONE DETECTED Final   Cocaine 01/23/2023 NONE DETECTED  NONE DETECTED Final   Benzodiazepines 01/23/2023 POSITIVE (A)  NONE DETECTED Final   Amphetamines 01/23/2023 NONE DETECTED  NONE DETECTED Final   Tetrahydrocannabinol 01/23/2023 NONE DETECTED  NONE DETECTED Final   Barbiturates 01/23/2023 NONE DETECTED  NONE DETECTED Final   Comment: (NOTE) DRUG SCREEN FOR MEDICAL PURPOSES ONLY.  IF CONFIRMATION IS NEEDED FOR ANY PURPOSE, NOTIFY LAB WITHIN 5 DAYS.  LOWEST DETECTABLE LIMITS FOR URINE DRUG SCREEN Drug Class                     Cutoff (ng/mL) Amphetamine and metabolites    1000 Barbiturate and metabolites    200 Benzodiazepine                 200 Opiates and metabolites        300 Cocaine and metabolites        300 THC                            50 Performed at [facility], [address], [place], [place] [address]     Allergies: Fluconazole and Hydrocodone  Medications:  Facility Ordered  Medications  Medication   alum & mag hydroxide-simeth (MAALOX/MYLANTA) 200-200-20 MG/5ML suspension 15 mL   magnesium hydroxide (MILK OF MAGNESIA) suspension 15 mL   [COMPLETED] thiamine (VITAMIN B1) injection 100 mg   [START ON 01/25/2023] thiamine (VITAMIN B1) tablet 100 mg   multivitamin with minerals tablet 1 tablet   LORazepam (ATIVAN) tablet 1 mg   hydrOXYzine (ATARAX) tablet 25 mg   loperamide (IMODIUM) capsule 2-4 mg   ondansetron (ZOFRAN-ODT) disintegrating tablet 4 mg   LORazepam (ATIVAN) tablet 1 mg   Followed by   Melene Muller ON 01/25/2023] LORazepam (ATIVAN) tablet 1 mg   Followed by   Melene Muller ON 01/26/2023] LORazepam (ATIVAN) tablet 1 mg  Followed by   Melene Muller ON 01/27/2023] LORazepam (ATIVAN) tablet 1 mg   PTA Medications  Medication Sig   Multiple Vitamin (MULTIVITAMINS PO) Take 1 tablet by mouth daily.   b complex vitamins capsule Take 1 capsule by mouth daily.   fluticasone (FLONASE) 50 MCG/ACT nasal spray Place 1 spray into both nostrils daily as needed for allergies or rhinitis.   omeprazole (PRILOSEC) 40 MG capsule Take 1 capsule (40 mg total) by mouth in the morning and at bedtime. (Patient taking differently: Take 40 mg by mouth as needed.)   folic acid (FOLVITE) 1 MG tablet Take 1 tablet (1 mg total) by mouth daily.   thiamine (VITAMIN B-1) 100 MG tablet Take 1 tablet (100 mg total) by mouth daily. (Patient not taking: Reported on 01/23/2023)   BLACK CURRANT SEED OIL PO Take by mouth daily. (Patient not taking: Reported on 01/23/2023)    Long Term Goals: Improvement in symptoms so as ready for discharge  Short Term Goals: Patient will verbalize feelings in meetings with treatment team members., Patient will attend at least of 50% of the groups daily., Pt will complete the PHQ9 on admission, day 3 and discharge., Patient will participate in completing the [place] Suicide Severity Rating Scale, Patient will score a low risk of violence for 24 hours prior to discharge,  and Patient will take medications as prescribed daily.  Medical Decision Making  Recommend admission to the [facility] for substance abuse treatment/detox.  I have reviewed the labs and medications  performed up to date at [facility]: CBC w/diff WNL, CMP WNL, Urinalysis unremarkable, BAL elevated at 332, UDS positive for benzos  EKG ordered.   Recommend CIWA protocol Alcohol abuse.   Initiate CIWA protocol -lorazepam 1 mg every 6 hours prn for CIWA >10 -thiamine 100 mg daily for nutritional supplementation -hydroxyzine 25 mg every 6 hours prn for anxiety, CIWA < or = 10 -ondansetron 4 mg ODT every 6 hours prn nausea/vomiting -loperamide 2-4 mg capsule prn diarrhea or loose stools   Other Prn meds -Maalox 15 ml PO q4h, prn, indigestion -MOM 15 ml, PO daily, prn, constipation  Recommendations  Based on my evaluation the patient does not appear to have an emergency medical condition.  Recommend admission to the [facility] for substance abuse treatment/alcohol detox. Recommend CIWA protocol.  Mancel Bale, NP 01/24/23  2:40 AM

## 2023-01-24 NOTE — Group Note (Signed)
Group Topic: Understanding Self  Group Date: 01/24/2023 Start Time: 1000 End Time: 1015 Facilitators: Priscille Kluver, NT  Department: [facility]  Number of Participants: 1  Group Focus: coping skills Treatment Modality:  Solution-Focused Therapy Interventions utilized were story telling Purpose: express feelings  Name: Danielle Reilly Date of Birth: 1976-03-20  MR: [id number]    Level of Participation: Pt did not attend group.   Patients Problems:  Patient Active Problem List   Diagnosis Date Noted   Alcohol intoxication in relapsed alcoholic (HCC) 01/24/2023   Alcohol abuse with other alcohol-induced disorder (HCC) 01/23/2023   Chronic post-traumatic stress disorder (PTSD) 01/23/2023   Major depressive disorder, single episode, severe without psychotic features (HCC) 01/23/2023   Generalized anxiety disorder 12/06/2020   History of gastritis 12/06/2020   History of alcohol abuse 12/06/2020   GERD (gastroesophageal reflux disease) 06/17/2019

## 2023-01-25 DIAGNOSIS — F411 Generalized anxiety disorder: Secondary | ICD-10-CM | POA: Diagnosis not present

## 2023-01-25 DIAGNOSIS — F10229 Alcohol dependence with intoxication, unspecified: Secondary | ICD-10-CM | POA: Diagnosis not present

## 2023-01-25 DIAGNOSIS — F332 Major depressive disorder, recurrent severe without psychotic features: Secondary | ICD-10-CM | POA: Diagnosis not present

## 2023-01-25 DIAGNOSIS — F10230 Alcohol dependence with withdrawal, uncomplicated: Secondary | ICD-10-CM | POA: Diagnosis not present

## 2023-01-25 MED ORDER — PANTOPRAZOLE SODIUM 40 MG PO TBEC
40.0000 mg | DELAYED_RELEASE_TABLET | Freq: Two times a day (BID) | ORAL | Status: DC
  Administered 2023-01-25 – 2023-01-27 (×4): 40 mg via ORAL
  Filled 2023-01-25 (×4): qty 1

## 2023-01-25 MED ORDER — FAMOTIDINE 20 MG PO TABS
20.0000 mg | ORAL_TABLET | Freq: Two times a day (BID) | ORAL | Status: DC
  Administered 2023-01-25 – 2023-01-27 (×4): 20 mg via ORAL
  Filled 2023-01-25 (×4): qty 1

## 2023-01-25 NOTE — ED Notes (Signed)
Patient remains asleep in bed without distress or complaint.  Will monitor.

## 2023-01-25 NOTE — Discharge Instructions (Addendum)
Intake Appointment available for patient between the hours of 6:00am and 10:00am with Franco Collet Monday- Friday for intensive outpatient and trauma informed therapy. Patient has been advised to report on Monday 01/29/2023 by 8:00am. Please present photo ID and insurance card to front desk.    [facility] [address], [place], [address] [phone] phone  New Patient Assessment/Therapy Walk-Ins:  Monday and Wednesday: 8 am until slots are full. Every 1st and 2nd Fridays of the month: 1 pm - 5 pm.  NO ASSESSMENT/THERAPY WALK-INS ON TUESDAYS OR THURSDAYS  New Patient Assessment/Medication Management Walk-Ins:  Monday - Friday:  8 am - 11 am.  For all walk-ins, we ask that you arrive by 7:30 am because patients will be seen in the order of arrival.  Availability is limited; therefore, you may not be seen on the same day that you walk-in.  Our goal is to serve and meet the needs of our community to the best of our ability.  SUBSTANCE USE TREATMENT for Medicaid and State Funded/IPRS  [facility] [address], [place], [address] [phone] phone NOTE: [facility] is no longer offering IOP services.  Serves those who are low-income or have no insurance.  [facility] [address] [place], [place], [address] [phone] phone [phone] fax NOTE: Does have Substance Abuse-Intensive Outpatient Program [facility]) as well as transitional housing if eligible.  [facility] [address] [place], [place], [address] [phone] phone [phone] fax  [facility] [phone] [place], [place], [address] [phone] phone [phone] fax  HALFWAY HOUSES:  [facility] [phone]  [employer].oxfordvacancies.com  12 STEP PROGRAMS:  Alcoholics Anonymous of [place] [employer]be  Narcotics Anonymous of [place]  HitProtect.dk  Al-Anon of [place], [place] www.[place]alanon.org/find-meetings.html  Nar-Anon https://nar-anon.org/find-a-meetin  List of Residential placements:   [facility] in [place]: [phone]  [facility]: [phone]  [place], [place] [phone]: Female and female facility; 30-day program: (uninsured and Medicaid such as Laurena Bering, [place], [place], partners)  [facility]: [phone]; men and women's facility; 28 days; Can have Medicaid tailored plan Tour manager or Partners)  [facility]: [phone] Karoline Caldwell or Larita Fife; 28 day program; must be fully detox; tailored Medicaid or no insurance  [address] in [place], [place]; [phone]; 28 day all males program; no insurance accepted  [facility] Referral in [place]: Gabriel Rung [phone] (no insurance or Medicaid only); 90 days; outpatient services but provide housing in apartments [place]  [facility] Admission: [phone]: Patient must complete phone screening for placement: [place], [place] ; 6 month program; uninsured, Medicaid, and [employer].   [facility]: no insurance required; [phone]  [facility]: [phone]; Intake: Molly Maduro; Must fill out application online; Alecia Lemming Delay [phone] x [address] [facility] in [place], [place]: [phone]; Admissions Coordinators Mr. Maurine Minister or Barron Alvine; 90 day program.  [facility]: [place], [place] [phone]; Co-Ed 9 month to a year program; Online application; Men entry fee is $500 (6-33months);  [employer]: [address] [place], [place] [address]; no fee or insurance required; minimum of 2 years; Highly structured; work based; Intake Coordinator is Thayer Ohm [phone]  [facility] in [place], [place]: [phone]; Fax number is [phone]; website: www.[facility].org; Requires 3-6 page  autobiography; 2 year program (18 months and then 18month transitional housing); Admission fee is $300; no insurance needed; work Automotive engineer in [place], [place]: Research officer, trade union: Danise Edge [phone]:  They have a Men's Regenerations Program 6-58months. Free program; There is an initial $300 fee however, they are willing to work with patients regarding that. Application is online.  [facility]: Admissions [phone] Doran Heater ext [phone]; Any 7-90 day program is out of pocket; 12 month program is free of charge; there is a $275 entry fee; Patient is responsible for own transportation

## 2023-01-25 NOTE — ED Notes (Signed)
 Patient was provided dinner

## 2023-01-25 NOTE — ED Notes (Signed)
Patient was provided lunch.

## 2023-01-25 NOTE — Group Note (Signed)
Group Topic: Balance in Life  Group Date: 01/25/2023 Start Time: 0230 End Time: 0300 Facilitators: Lenny Pastel  Department: [facility]  Number of Participants: no participants on today  Group Focus: check in, clarity of thought, coping skills, daily focus, problem solving, and self-awareness Treatment Modality:  Cognitive Behavioral Therapy Interventions utilized were assignment, problem solving, and story telling Purpose: enhance coping skills, explore maladaptive thinking, express feelings, increase insight, reinforce self-care, and relapse prevention strategies  Name: Danielle Reilly Date of Birth: October 18, 1975  MR: [id number]    Level of Participation: Patient decided to remain in bed on today. Patient reports feeling tired and needing to rest. Patient aware of plan at discharge. No other needs to report.   Patients Problems:  Patient Active Problem List   Diagnosis Date Noted   Alcohol intoxication in relapsed alcoholic (HCC) 01/24/2023   Alcohol abuse with other alcohol-induced disorder (HCC) 01/23/2023   Chronic post-traumatic stress disorder (PTSD) 01/23/2023   Major depressive disorder, single episode, severe without psychotic features (HCC) 01/23/2023   Generalized anxiety disorder 12/06/2020   History of gastritis 12/06/2020   History of alcohol abuse 12/06/2020   GERD (gastroesophageal reflux disease) 06/17/2019

## 2023-01-25 NOTE — Group Note (Signed)
Group Topic: Communication  Group Date: 01/25/2023 Start Time: 2000 End Time: 2020 Facilitators: Rae Lips B  Department: [facility]  Number of Participants: 1  Group Focus: activities of daily living skills Treatment Modality:  Individual Therapy Interventions utilized were patient education, story telling, and support Purpose: enhance coping skills and express feelings  Name: Danielle Reilly Date of Birth: 1975/08/10  MR: [id number]    Level of Participation: PT DID NOT ATTEND GROUPS Quality of Participation: withdrawn Interactions with others: Pt has kept to herself. Mood/Affect: appropriate Triggers (if applicable): NA Cognition: coherent/clear Progress: Minimal Response: NA Plan: patient will be encouraged to go to groups  Patients Problems:  Patient Active Problem List   Diagnosis Date Noted   Alcohol intoxication in relapsed alcoholic (HCC) 01/24/2023   Alcohol abuse with other alcohol-induced disorder (HCC) 01/23/2023   Chronic post-traumatic stress disorder (PTSD) 01/23/2023   Major depressive disorder, single episode, severe without psychotic features (HCC) 01/23/2023   Generalized anxiety disorder 12/06/2020   History of gastritis 12/06/2020   History of alcohol abuse 12/06/2020   GERD (gastroesophageal reflux disease) 06/17/2019

## 2023-01-25 NOTE — ED Provider Notes (Addendum)
Behavioral Health Progress Note  Date and Time: 01/25/2023 5:46 PM Name: Danielle Reilly MRN:  [id number]  Subjective:   Danielle Reilly is a 47 year old female with past history of MDD, GAD, AUD in early recovery, past hospitalization for alcohol withdrawal x1, and alcohol-induced gastritis who presents after return to use of alcohol and alcohol withdrawal and passive suicidal thoughts.  Regarding her depression and anxiety, she reports continued depressed mood.  She does not endorse suicidal thoughts today.  She does endorse anxiety.  She has continued issues with her sleep but did report some improvement with falling asleep with mirtazapine.  She reports no appetite this morning and thus not wanting to get up for breakfast.  We discussed the importance of her getting out of bed in the morning so that she can be awake during the day and then have the physiological drive to sleep this evening versus insomnia.  Gave the patient resources sleep hygiene through the [facility] insomnia coach app.   With regard to her alcohol withdrawal, she reports no symptoms except for sweats.  She has continued the benzodiazepine taper without issue is agreeable to continue this until Saturday morning. Then she will discharge home with safety planning with son.  This will give her adequate time to prep for her colonoscopy on Monday.  Told the patient she should talk to the GI doctor about the referred back pain she has been experiencing.  She will follow up with alcohol and drug services for outpatient resources including medication management, therapy, and CDIOP.  This information is in discharge paperwork.  With regard to medications, as previously stated she notes improvement in sleep and mirtazapine.  She reports no side effects to the mirtazapine.  She has noticed any improvements in her appetite with that yet.  She has not noticed any other concerns.  No GI symptoms including diarrhea.  Regarding the naltrexone that was also  initiated yesterday she does not report any changes in her cravings.  She is not reporting side effects.  With regard to her back pain, she reported that it was painful with eating today and more localed as abdominal pain today. We discussed that this (and based on her recent Hx of alcohol-induced gastritis) would likely represent the gastritis. We discussed making her PPI twice daily and starting adjunct famotidine twice daily. She reports also having history of pancreatitis but that it did not feel nearly as severe as that presentation. Discussed pursuing further testing but patietn preferred to defer for now unless worsens.     Diagnosis:  Final diagnoses:  Alcohol use disorder, severe, dependence (HCC)  Alcohol withdrawal syndrome without complication (HCC)  MDD (major depressive disorder), recurrent severe, without psychosis (HCC)  Generalized anxiety disorder  PTSD (post-traumatic stress disorder)    Total Time spent with patient: 1.5 hours  Past Psychiatric History: MDD, GAD, IUD in early remission, alcohol withdrawal Past Medical History: Alcohol-induced gastritis on omeprazole Family History: None reported Family Psychiatric  History: Second-degree relative with AUD Social History: Lives at home with her son who is 52, works at [employer]  Current Medications:  Current Facility-Administered Medications  Medication Dose Route Frequency Provider Last Rate Last Admin   alum & mag hydroxide-simeth (MAALOX/MYLANTA) 200-200-20 MG/5ML suspension 15 mL  15 mL Oral Q4H PRN Onuoha, Chinwendu V, NP   15 mL at 01/24/23 1000   famotidine (PEPCID) tablet 20 mg  20 mg Oral BID Meryl Dare, MD       hydrOXYzine (ATARAX) tablet 25  mg  25 mg Oral Q6H PRN Onuoha, Chinwendu V, NP   25 mg at 01/24/23 0214   loperamide (IMODIUM) capsule 2-4 mg  2-4 mg Oral PRN Onuoha, Chinwendu V, NP       LORazepam (ATIVAN) tablet 1 mg  1 mg Oral Q6H PRN Onuoha, Chinwendu V, NP       LORazepam (ATIVAN) tablet 1  mg  1 mg Oral TID Onuoha, Chinwendu V, NP   1 mg at 01/25/23 1523   Followed by   Melene Muller ON 01/26/2023] LORazepam (ATIVAN) tablet 1 mg  1 mg Oral BID Onuoha, Chinwendu V, NP       Followed by   Melene Muller ON 01/27/2023] LORazepam (ATIVAN) tablet 1 mg  1 mg Oral Daily Onuoha, Chinwendu V, NP       magnesium hydroxide (MILK OF MAGNESIA) suspension 15 mL  15 mL Oral Daily PRN Onuoha, Chinwendu V, NP       mirtazapine (REMERON) tablet 7.5 mg  7.5 mg Oral QHS Meryl Dare, MD   7.5 mg at 01/24/23 2118   multivitamin with minerals tablet 1 tablet  1 tablet Oral Daily Onuoha, Chinwendu V, NP   1 tablet at 01/25/23 0923   naltrexone (DEPADE) tablet 25 mg  25 mg Oral Daily Meryl Dare, MD   25 mg at 01/25/23 0923   ondansetron (ZOFRAN-ODT) disintegrating tablet 4 mg  4 mg Oral Q6H PRN Onuoha, Chinwendu V, NP   4 mg at 01/24/23 1319   pantoprazole (PROTONIX) EC tablet 40 mg  40 mg Oral BID AC Meryl Dare, MD   40 mg at 01/25/23 1714   thiamine (VITAMIN B1) tablet 100 mg  100 mg Oral Daily Onuoha, Chinwendu V, NP   100 mg at 01/25/23 5409   Current Outpatient Medications  Medication Sig Dispense Refill   b complex vitamins capsule Take 1 capsule by mouth daily.     BLACK CURRANT SEED OIL PO Take by mouth daily. (Patient not taking: Reported on 01/23/2023)     fluticasone (FLONASE) 50 MCG/ACT nasal spray Place 1 spray into both nostrils daily as needed for allergies or rhinitis.     folic acid (FOLVITE) 1 MG tablet Take 1 tablet (1 mg total) by mouth daily. 30 tablet 1   Multiple Vitamin (MULTIVITAMINS PO) Take 1 tablet by mouth daily.     omeprazole (PRILOSEC) 40 MG capsule Take 1 capsule (40 mg total) by mouth in the morning and at bedtime. (Patient taking differently: Take 40 mg by mouth as needed.) 60 capsule 0   thiamine (VITAMIN B-1) 100 MG tablet Take 1 tablet (100 mg total) by mouth daily. (Patient not taking: Reported on 01/23/2023) 30 tablet 1    Labs  Lab Results:  Admission on 01/23/2023,  Discharged on 01/24/2023  Component Date Value Ref Range Status   Sodium 01/23/2023 141  135 - 145 mmol/L Final   Potassium 01/23/2023 3.5  3.5 - 5.1 mmol/L Final   Chloride 01/23/2023 105  98 - 111 mmol/L Final   CO2 01/23/2023 21 (L)  22 - 32 mmol/L Final   Glucose, Bld 01/23/2023 91  70 - 99 mg/dL Final   Glucose reference range applies only to samples taken after fasting for at least 8 hours.   BUN 01/23/2023 7  6 - 20 mg/dL Final   Creatinine, Ser 01/23/2023 0.82  0.44 - 1.00 mg/dL Final   Calcium 81/19/1478 8.9  8.9 - 10.3 mg/dL Final   Total Protein 29/56/2130 8.4 (H)  6.5 - 8.1 g/dL Final   Albumin 16/01/9603 4.7  3.5 - 5.0 g/dL Final   AST 54/12/8117 37  15 - 41 U/L Final   ALT 01/23/2023 26  0 - 44 U/L Final   Alkaline Phosphatase 01/23/2023 80  38 - 126 U/L Final   Total Bilirubin 01/23/2023 0.8  0.3 - 1.2 mg/dL Final   GFR, Estimated 01/23/2023 >60  >60 mL/min Final   Comment: (NOTE) Calculated using the CKD-EPI Creatinine Equation (2021)    Anion gap 01/23/2023 15  5 - 15 Final   Performed at [facility], [address], [place], [place] [address]   Alcohol, Ethyl (B) 01/23/2023 332 (HH)  <10 mg/dL Final   Comment: CRITICAL RESULT CALLED TO, READ BACK BY AND VERIFIED WITH CARPENTER, D. RN AT 1457 ON 01/23/23. FA (NOTE) Lowest detectable limit for serum alcohol is 10 mg/dL.  For medical purposes only. Performed at [facility], [address] [place], [place] [address]    WBC 01/23/2023 4.4  4.0 - 10.5 K/uL Final   RBC 01/23/2023 4.81  3.87 - 5.11 MIL/uL Final   Hemoglobin 01/23/2023 13.5  12.0 - 15.0 g/dL Final   HCT 30/86/5784 40.9  36.0 - 46.0 % Final   MCV 01/23/2023 85.0  80.0 - 100.0 fL Final   MCH 01/23/2023 28.1  26.0 - 34.0 pg Final   MCHC 01/23/2023 33.0  30.0 - 36.0 g/dL Final   RDW 69/62/9528 15.6 (H)  11.5 - 15.5 % Final   Platelets 01/23/2023 263  150 - 400 K/uL Final   nRBC 01/23/2023 0.0  0.0 - 0.2 % Final    Neutrophils Relative % 01/23/2023 44  % Final   Neutro Abs 01/23/2023 1.9  1.7 - 7.7 K/uL Final   Lymphocytes Relative 01/23/2023 50  % Final   Lymphs Abs 01/23/2023 2.2  0.7 - 4.0 K/uL Final   Monocytes Relative 01/23/2023 4  % Final   Monocytes Absolute 01/23/2023 0.2  0.1 - 1.0 K/uL Final   Eosinophils Relative 01/23/2023 0  % Final   Eosinophils Absolute 01/23/2023 0.0  0.0 - 0.5 K/uL Final   Basophils Relative 01/23/2023 2  % Final   Basophils Absolute 01/23/2023 0.1  0.0 - 0.1 K/uL Final   Immature Granulocytes 01/23/2023 0  % Final   Abs Immature Granulocytes 01/23/2023 0.00  0.00 - 0.07 K/uL Final   Performed at [facility], [address], [place], [place] [address]   hCG, Conley Rolls, Quant, S 01/23/2023 1  <5 mIU/mL Final   Comment:          GEST. AGE      CONC.  (mIU/mL)   <=1 WEEK        5 - 50     2 WEEKS       50 - 500     3 WEEKS       100 - 10,000     4 WEEKS     1,000 - 30,000     5 WEEKS     3,500 - 115,000   6-8 WEEKS     12,000 - 270,000    12 WEEKS     15,000 - 220,000        FEMALE AND NON-PREGNANT FEMALE:     LESS THAN 5 mIU/mL Performed at [facility], [address], [place], [place] [address]    Color, Urine 01/23/2023 STRAW (A)  YELLOW Final  APPearance 01/23/2023 CLEAR  CLEAR Final   Specific Gravity, Urine 01/23/2023 1.005  1.005 - 1.030 Final   pH 01/23/2023 6.0  5.0 - 8.0 Final   Glucose, UA 01/23/2023 NEGATIVE  NEGATIVE mg/dL Final   Hgb urine dipstick 01/23/2023 SMALL (A)  NEGATIVE Final   Bilirubin Urine 01/23/2023 NEGATIVE  NEGATIVE Final   Ketones, ur 01/23/2023 NEGATIVE  NEGATIVE mg/dL Final   Protein, ur 65/78/4696 NEGATIVE  NEGATIVE mg/dL Final   Nitrite 29/52/8413 NEGATIVE  NEGATIVE Final   Leukocytes,Ua 01/23/2023 NEGATIVE  NEGATIVE Final   RBC / HPF 01/23/2023 0-5  0 - 5 RBC/hpf Final   WBC, UA 01/23/2023 0-5  0 - 5 WBC/hpf Final   Bacteria, UA 01/23/2023 RARE (A)  NONE SEEN Final   Squamous  Epithelial / HPF 01/23/2023 0-5  0 - 5 /HPF Final   Performed at [facility], [address] [place], [place] [address]   Opiates 01/23/2023 NONE DETECTED  NONE DETECTED Final   Cocaine 01/23/2023 NONE DETECTED  NONE DETECTED Final   Benzodiazepines 01/23/2023 POSITIVE (A)  NONE DETECTED Final   Amphetamines 01/23/2023 NONE DETECTED  NONE DETECTED Final   Tetrahydrocannabinol 01/23/2023 NONE DETECTED  NONE DETECTED Final   Barbiturates 01/23/2023 NONE DETECTED  NONE DETECTED Final   Comment: (NOTE) DRUG SCREEN FOR MEDICAL PURPOSES ONLY.  IF CONFIRMATION IS NEEDED FOR ANY PURPOSE, NOTIFY LAB WITHIN 5 DAYS.  LOWEST DETECTABLE LIMITS FOR URINE DRUG SCREEN Drug Class                     Cutoff (ng/mL) Amphetamine and metabolites    1000 Barbiturate and metabolites    200 Benzodiazepine                 200 Opiates and metabolites        300 Cocaine and metabolites        300 THC                            50 Performed at [facility], [address] [place], [place] [address]     Blood Alcohol level:  Lab Results  Component Value Date   ETH 332 [facility]) 01/23/2023   ETH <10 07/14/2022    Metabolic Disorder Labs: Lab Results  Component Value Date   HGBA1C 5.1 07/24/2022   No results found for: "PROLACTIN" Lab Results  Component Value Date   CHOL 204 (H) 07/24/2022   TRIG 92.0 07/24/2022   HDL 66.10 07/24/2022   CHOLHDL 3 07/24/2022   VLDL 18.4 07/24/2022   LDLCALC 119 (H) 07/24/2022   LDLCALC 75 12/06/2020    Physical Findings   CAGE-AID    Flowsheet Row ED to Hosp-Admission (Discharged) from 12/20/2021 in [place] [facility]  CAGE-AID Score 4      PHQ2-9    Flowsheet Row ED from 01/24/2023 in [facility] Office Visit from 12/06/2020 in [facility] Office Visit from 07/02/2019 in [facility]  PHQ-2 Total Score 4 0 0   PHQ-9 Total Score 20 -- 0      Flowsheet Row ED from 01/24/2023 in [facility] Acute Care ED from 01/23/2023 in [facility] Emergency Department at [facility] ED from 07/14/2022 in [facility] Emergency Department at [facility]  C-SSRS RISK CATEGORY No Risk No Risk  No Risk        Musculoskeletal  Strength & Muscle Tone: within normal limits Gait & Station: normal Patient leans: N/A  Psychiatric Specialty Exam  Presentation  General Appearance:  Appropriate for Environment  Eye Contact: Fair  Speech: Slow  Speech Volume: Normal  Handedness: Right   Mood and Affect  Mood: Depressed  Affect: Congruent   Thought Process  Thought Processes: Coherent  Descriptions of Associations:Intact  Orientation:Full (Time, Place and Person)  Thought Content:Logical     Hallucinations:Hallucinations: None  Ideas of Reference:None  Suicidal Thoughts:Suicidal Thoughts: No  Homicidal Thoughts:Homicidal Thoughts: No   Sensorium  Memory: Immediate Good; Recent Good; Remote Good  Judgment: Fair  Insight: Fair   Art therapist  Concentration: Good  Attention Span: Good  Recall: Good  Fund of Knowledge: Good  Language: Good   Psychomotor Activity  Psychomotor Activity: Psychomotor Activity: Decreased   Assets  Assets: Social Support; Desire for Improvement; Financial Resources/Insurance; Transportation; Housing   Sleep  Sleep: Sleep: Fair (Fell asleep well but still fatigued in the morning.) Number of Hours of Sleep: 10   Nutritional Assessment (For OBS and FBC admissions only) Has the patient had a weight loss or gain of 10 pounds or more in the last 3 months?: No Has the patient had a decrease in food intake/or appetite?: No Does the patient have dental problems?: No Does the patient have eating habits or behaviors that may be indicators of an eating disorder including binging or inducing  vomiting?: No Has the patient recently lost weight without trying?: 0 Has the patient been eating poorly because of a decreased appetite?: 0 Malnutrition Screening Tool Score: 0    Physical Exam  Physical Exam Vitals and nursing note reviewed.  Constitutional:      General: She is not in acute distress. HENT:     Head: Normocephalic and atraumatic.  Pulmonary:     Effort: Pulmonary effort is normal.  Neurological:     General: No focal deficit present.     Mental Status: She is alert.     Gait: Gait normal.    Review of Systems  Constitutional:  Negative for chills, diaphoresis, fever and weight loss.  Respiratory:  Negative for cough and shortness of breath.   Cardiovascular:  Negative for chest pain.  Gastrointestinal:  Positive for heartburn. Negative for abdominal pain, constipation, diarrhea, nausea and vomiting.  Genitourinary:  Positive for flank pain. Negative for dysuria and hematuria.  Musculoskeletal:  Positive for back pain.  Neurological:  Negative for tingling, sensory change and focal weakness.   Blood pressure 122/81, pulse 88, temperature 98.4 F (36.9 C), temperature source Oral, resp. rate 16, last menstrual period 06/19/2013, SpO2 98%. There is no height or weight on file to calculate BMI.  Treatment Plan Summary: Daily contact with patient to assess and evaluate symptoms and progress in treatment and Medication management  # Alcohol use disorder, severe  alcohol withdrawal without complication CIWA scores of 10, and 5, 7, 5 and last 24 hours.  Continue Ativan fixed dose taper until Saturday Continue naltrexone 25 mg once daily for alcohol dependence Referral to CD-IOP at alcohol and drug services  # MDD  GAD  PTSD Patient noticed some sleep improvement with mirtazapine.  She does not describe any suicidal thoughts today.  She does have some excessive sleepiness though and continued lack of appetite. Plan: Continue mirtazapine 7.5 mg once at night  for MDD, GAD, PTSD Plan initiate trauma focused therapy at [facility] Will  safety plan prior to discharge with son  # Acute back pain / abdominal pain No red flag signs or symptoms.  Has not experienced this before.  May be referred back pain related gastritis.  Normal EKG. Responding to ibuprofen and Tylenol. Reports today that it was worsened with food and more localized to abdomen. We discussed making PPI twice daily and adding adjunct famotidine. She reports that it did not feel like her pancreatitis in the past.  Continue pain management with ibuprofen and Tylenol Switch pantoprazole from 80 mg once daily to 40 mg twice daily before meals for gastritis Started famotidine 20 mg twice daily for gastris Monitor symptoms for improvement with low threshold to further workup for pancreatitis or other acute pathologies  Disposition: Sat morning pending fixed dose benzo taper.   Meryl Dare, MD PGY-1 Psychiatry Resident 01/25/2023, 5:46 PM

## 2023-01-25 NOTE — ED Notes (Signed)
Patient is seen sleeping in the bed with intermittent leg movement. NAD. Respirations are even and unlabored.

## 2023-01-25 NOTE — Progress Notes (Signed)
Patient is now awake and eating lunch in the day room.  She is calm and cooperative with care.  No distress or complaints.  No withdrawal at this time.  Will monitor.

## 2023-01-25 NOTE — Group Note (Signed)
Group Topic: Relaxation  Group Date: 01/25/2023 Start Time: 1100 End Time: 1200 Facilitators: Cassandria Anger  Department: [facility]  Number of Participants: 3  Group Focus: relaxation Treatment Modality:  Psychoeducation Interventions utilized were patient education Purpose: increase insight  Name: Danielle Reilly Date of Birth: April 21, 1975  MR: [id number]    Level of Participation: N/A- did not attend Quality of Participation: N/A Interactions with others: N/A Mood/Affect: N/A Triggers (if applicable): N/A Cognition: N/A Progress: None Response: N/A Plan: follow-up needed  Patients Problems:  Patient Active Problem List   Diagnosis Date Noted   Alcohol intoxication in relapsed alcoholic (HCC) 01/24/2023   Alcohol abuse with other alcohol-induced disorder (HCC) 01/23/2023   Chronic post-traumatic stress disorder (PTSD) 01/23/2023   Major depressive disorder, single episode, severe without psychotic features (HCC) 01/23/2023   Generalized anxiety disorder 12/06/2020   History of gastritis 12/06/2020   History of alcohol abuse 12/06/2020   GERD (gastroesophageal reflux disease) 06/17/2019

## 2023-01-25 NOTE — ED Notes (Signed)
Patient is sleeping. Respirations equal and unlabored, skin warm and dry. No change in assessment or acuity. Routine safety checks conducted according to facility protocol. Will continue to monitor for safety.   

## 2023-01-25 NOTE — ED Notes (Signed)
Pt is in the bed sleeping. Respirations are even and unlabored. No acute distress noted. Will continue to monitor for safety. 

## 2023-01-25 NOTE — ED Notes (Signed)
Patient denied breakfast

## 2023-01-25 NOTE — ED Notes (Addendum)
Patient slepping

## 2023-01-26 DIAGNOSIS — F411 Generalized anxiety disorder: Secondary | ICD-10-CM | POA: Diagnosis not present

## 2023-01-26 DIAGNOSIS — F332 Major depressive disorder, recurrent severe without psychotic features: Secondary | ICD-10-CM | POA: Diagnosis not present

## 2023-01-26 DIAGNOSIS — F10230 Alcohol dependence with withdrawal, uncomplicated: Secondary | ICD-10-CM | POA: Diagnosis not present

## 2023-01-26 DIAGNOSIS — F10229 Alcohol dependence with intoxication, unspecified: Secondary | ICD-10-CM | POA: Diagnosis not present

## 2023-01-26 NOTE — ED Notes (Signed)
Pt is in the bed resting. Respirations are even and unlabored. No acute distress noted. Will continue to monitor for safety

## 2023-01-26 NOTE — Group Note (Signed)
Group Topic: Relapse and Recovery  Group Date: 01/26/2023 Start Time: 2000 End Time: 2100 Facilitators: Rae Lips B  Department: [facility]  Number of Participants: 4  Group Focus: abuse issues, acceptance, and activities of daily living skills Treatment Modality:  Leisure Development Interventions utilized were leisure development Purpose: express feelings  Name: Danielle Reilly Date of Birth: 08-Jun-1975  MR: [id number]    Level of Participation: active Quality of Participation: attentive, cooperative, and initiates communication Interactions with others: gave feedback Mood/Affect: appropriate Triggers (if applicable): NA Cognition: coherent/clear Progress: Gaining insight Response: NA Plan: patient will be encouraged to keep going to groups.  Patients Problems:  Patient Active Problem List   Diagnosis Date Noted   Alcohol intoxication in relapsed alcoholic (HCC) 01/24/2023   Alcohol abuse with other alcohol-induced disorder (HCC) 01/23/2023   Chronic post-traumatic stress disorder (PTSD) 01/23/2023   Major depressive disorder, single episode, severe without psychotic features (HCC) 01/23/2023   Generalized anxiety disorder 12/06/2020   History of gastritis 12/06/2020   History of alcohol abuse 12/06/2020   GERD (gastroesophageal reflux disease) 06/17/2019

## 2023-01-26 NOTE — ED Notes (Signed)
Patient is sleeping. Respirations equal and unlabored, skin warm and dry. No change in assessment or acuity. Routine safety checks conducted according to facility protocol. Will continue to monitor for safety.   

## 2023-01-26 NOTE — ED Notes (Signed)
Patient observed resting quietly, eyes closed. Respirations equal and unlabored. Will continue to monitor for safety.  

## 2023-01-26 NOTE — Group Note (Signed)
Group Topic: Recovery Basics  Group Date: 01/26/2023 Start Time: 1300 End Time: 1345 Facilitators: Olin Hauser, RN  Department: [facility]  Number of Participants: 3  Group Focus: goals/reality orientation Treatment Modality:  Interpersonal Therapy and Solution-Focused Therapy Interventions utilized were assignment, exploration, and problem solving Purpose: increase insight and relapse prevention strategies  Name: Danielle Reilly Date of Birth: 10-13-1975  MR: [id number]    Level of Participation: active Quality of Participation: attentive, cooperative, engaged, initiates communication, and motivated Interactions with others: gave feedback Mood/Affect: appropriate and positive Triggers (if applicable): None Cognition: coherent/clear and insightful Progress: Significant Response: Patient was engaged throughout group Plan: patient will be encouraged to continue to make goals related to sobriety  Patients Problems:  Patient Active Problem List   Diagnosis Date Noted   Alcohol intoxication in relapsed alcoholic (HCC) 01/24/2023   Alcohol abuse with other alcohol-induced disorder (HCC) 01/23/2023   Chronic post-traumatic stress disorder (PTSD) 01/23/2023   Major depressive disorder, single episode, severe without psychotic features (HCC) 01/23/2023   Generalized anxiety disorder 12/06/2020   History of gastritis 12/06/2020   History of alcohol abuse 12/06/2020   GERD (gastroesophageal reflux disease) 06/17/2019

## 2023-01-26 NOTE — ED Notes (Signed)
Pt observed in the dining room. She is calm and cooperative. Denies any needs at this moment. Will continue to monitor for safety.

## 2023-01-26 NOTE — Group Note (Signed)
Group Topic: Healthy Self Image and Positive Change  Group Date: 01/26/2023 Start Time: 1015 End Time: 1100 Facilitators: Ninfa Linden, NT+3 MHT 2  Department: [facility]  Number of Participants: 4  Group Focus: chemical dependency issues Treatment Modality:  Individual Therapy Interventions utilized were problem solving Purpose: improve communication skills  Name: Danielle Reilly Date of Birth: 02/25/1976  MR: [id number]    Level of Participation: Patient did not attend group. Quality of Participation: Patient did not attend group. Interactions with others: Patient  did not attend group. Mood/Affect: Patient did not attend group. Triggers (if applicable): Patient did not attend group. Cognition: Patient did not attend group. Progress: Patient did not attend group. Response: Patient did not attend group. Plan: Patient did not attend group.  Patients Problems:  Patient Active Problem List   Diagnosis Date Noted   Alcohol intoxication in relapsed alcoholic (HCC) 01/24/2023   Alcohol abuse with other alcohol-induced disorder (HCC) 01/23/2023   Chronic post-traumatic stress disorder (PTSD) 01/23/2023   Major depressive disorder, single episode, severe without psychotic features (HCC) 01/23/2023   Generalized anxiety disorder 12/06/2020   History of gastritis 12/06/2020   History of alcohol abuse 12/06/2020   GERD (gastroesophageal reflux disease) 06/17/2019

## 2023-01-26 NOTE — Discharge Planning (Signed)
LCSW was informed by MD that patient will discharge on tomorrow after her taper is complete. LCSW went and spoke with patient and patient reports her plan is to return home with her son, and she will follow up outpatient with [facility] for CDIOP services and trauma informed therapy. Patient reports no concerns regarding returning home. Patient reports she plans to rid herself of certain people, places, and things. Patient reports she wants to learn how to pour more into herself rather than other people. Patient reports she knows what she needs to do, she just has to do it. Patient reports feelings of this process being hard and brief counseling was provided to the patient. Patient was receptive to the feedback provided and reports being appreciative of LCSW assistance. Patient reports she will need help with transportation on tomorrow as her son has to work. Address confirmed and LCSW will assist getting patient back to her home via taxi. Form completed and placed in patient's cart. No other needs were reported. LCSW will continue to follow and provide support to patient while in [facility].   Fernande Boyden, LCSW Clinical Social Worker [facility] Ph: [phone]

## 2023-01-26 NOTE — ED Notes (Signed)
Patient is alert and oriented X 4. She denies SI/HI or AVH. No complaints of physical pain when asked. Patient participated actively in group this afternoon. She interacts with both staff and peers appropriately. Patient is optimistic about her sobriety. She is looking forward to starting therapy to get to the root cause of her addiction. Patient denies any needs at this time. We will continue to monitor for safety.

## 2023-01-26 NOTE — ED Provider Notes (Signed)
Behavioral Health Progress Note  Date and Time: 01/26/2023 5:24 PM Name: Danielle Reilly MRN:  [id number]  Subjective:   Danielle Reilly is a 47 year old female with past history of MDD, GAD, AUD in early recovery, past hospitalization for alcohol withdrawal x1, and alcohol-induced gastritis who presents after return to use of alcohol and alcohol withdrawal and passive suicidal thoughts.  Regarding her MDD and GAD, she reports continued depressed mood.  Her sleep is also been off but we discussed that she needs to practice sleep hygiene as well per the handout I gave her.  She reports that she is having significant nervousness around returning home and having conversations with family and friends about her current situation.  He is motivational interviewing discussed ways that she can improve this and she endorses radical acceptance.  Discussed that she can have these conversations to be the practice.  Furthermore encouraged her to talk with her son who she is comfortable being open with.  Furthermore encouraged her to be open when in groups to practice opening up to others.  Lastly talked with how she can simply let others know that she is not a place to have these conversations and that she will talk to them when she feels more comfortable.  She found these helpful but was still anxious.  She had a lot of questions about her medications.  She asked if we would give her a prescription when she leaves and we informed her that we give her 30 supply and establish her with someone who can manage her medications.  We also discussed how some people are on psychiatric medications for their entire life but some people come off of them after several months.  Informed her that this is up to her and depending on how the medicine helps her.  Reinforced that the medication helps with sleep quickly but that the mood benefits take several weeks to have effect.  Regarding her pain, she reports that her GI epigastric related pain  has been better.  We discussed that she may be responding to the PPI and famotidine combo which can be modified by the GI doctors when they see her on Monday.  She does report that the back pain is continued and that it is worsened by movement and is somewhat reproduced by palpation.  We discussed that this is likely MSK pain and encouraged her to continue to mobilize the area but not to the point that it is painful.  Discussed that this may take some time to improve but that this is not anything that will require further workup or concerns at this time.   Diagnosis:  Final diagnoses:  Alcohol use disorder, severe, dependence (HCC)  Alcohol withdrawal syndrome without complication (HCC)  MDD (major depressive disorder), recurrent severe, without psychosis (HCC)  Generalized anxiety disorder  PTSD (post-traumatic stress disorder)    Total Time spent with patient: 1.5 hours  Past Psychiatric History: MDD, GAD, IUD in early remission, alcohol withdrawal Past Medical History: Alcohol-induced gastritis on omeprazole Family History: None reported Family Psychiatric  History: Second-degree relative with AUD Social History: Lives at home with her son who is 56, works at [employer]  Current Medications:  Current Facility-Administered Medications  Medication Dose Route Frequency Provider Last Rate Last Admin   alum & mag hydroxide-simeth (MAALOX/MYLANTA) 200-200-20 MG/5ML suspension 15 mL  15 mL Oral Q4H PRN Onuoha, Chinwendu V, NP   15 mL at 01/24/23 1000   famotidine (PEPCID) tablet 20 mg  20 mg Oral  BID Meryl Dare, MD   20 mg at 01/26/23 0981   hydrOXYzine (ATARAX) tablet 25 mg  25 mg Oral Q6H PRN Onuoha, Chinwendu V, NP   25 mg at 01/26/23 1253   loperamide (IMODIUM) capsule 2-4 mg  2-4 mg Oral PRN Onuoha, Chinwendu V, NP       LORazepam (ATIVAN) tablet 1 mg  1 mg Oral Q6H PRN Onuoha, Chinwendu V, NP       LORazepam (ATIVAN) tablet 1 mg  1 mg Oral BID Onuoha, Chinwendu V, NP   1 mg at  01/26/23 1914   Followed by   Melene Muller ON 01/27/2023] LORazepam (ATIVAN) tablet 1 mg  1 mg Oral Daily Onuoha, Chinwendu V, NP       magnesium hydroxide (MILK OF MAGNESIA) suspension 15 mL  15 mL Oral Daily PRN Onuoha, Chinwendu V, NP   15 mL at 01/26/23 0903   mirtazapine (REMERON) tablet 7.5 mg  7.5 mg Oral QHS Meryl Dare, MD   7.5 mg at 01/25/23 2138   multivitamin with minerals tablet 1 tablet  1 tablet Oral Daily Onuoha, Chinwendu V, NP   1 tablet at 01/26/23 0903   naltrexone (DEPADE) tablet 25 mg  25 mg Oral Daily Meryl Dare, MD   25 mg at 01/26/23 0903   ondansetron (ZOFRAN-ODT) disintegrating tablet 4 mg  4 mg Oral Q6H PRN Onuoha, Chinwendu V, NP   4 mg at 01/24/23 1319   pantoprazole (PROTONIX) EC tablet 40 mg  40 mg Oral BID AC Meryl Dare, MD   40 mg at 01/26/23 1651   thiamine (VITAMIN B1) tablet 100 mg  100 mg Oral Daily Onuoha, Chinwendu V, NP   100 mg at 01/26/23 0903   Current Outpatient Medications  Medication Sig Dispense Refill   b complex vitamins capsule Take 1 capsule by mouth daily.     BLACK CURRANT SEED OIL PO Take by mouth daily. (Patient not taking: Reported on 01/23/2023)     fluticasone (FLONASE) 50 MCG/ACT nasal spray Place 1 spray into both nostrils daily as needed for allergies or rhinitis.     folic acid (FOLVITE) 1 MG tablet Take 1 tablet (1 mg total) by mouth daily. 30 tablet 1   Multiple Vitamin (MULTIVITAMINS PO) Take 1 tablet by mouth daily.     omeprazole (PRILOSEC) 40 MG capsule Take 1 capsule (40 mg total) by mouth in the morning and at bedtime. (Patient taking differently: Take 40 mg by mouth as needed.) 60 capsule 0   thiamine (VITAMIN B-1) 100 MG tablet Take 1 tablet (100 mg total) by mouth daily. (Patient not taking: Reported on 01/23/2023) 30 tablet 1    Labs  Lab Results:  Admission on 01/23/2023, Discharged on 01/24/2023  Component Date Value Ref Range Status   Sodium 01/23/2023 141  135 - 145 mmol/L Final   Potassium 01/23/2023 3.5   3.5 - 5.1 mmol/L Final   Chloride 01/23/2023 105  98 - 111 mmol/L Final   CO2 01/23/2023 21 (L)  22 - 32 mmol/L Final   Glucose, Bld 01/23/2023 91  70 - 99 mg/dL Final   Glucose reference range applies only to samples taken after fasting for at least 8 hours.   BUN 01/23/2023 7  6 - 20 mg/dL Final   Creatinine, Ser 01/23/2023 0.82  0.44 - 1.00 mg/dL Final   Calcium 78/29/5621 8.9  8.9 - 10.3 mg/dL Final   Total Protein 30/86/5784 8.4 (H)  6.5 - 8.1 g/dL Final  Albumin 01/23/2023 4.7  3.5 - 5.0 g/dL Final   AST 78/29/5621 37  15 - 41 U/L Final   ALT 01/23/2023 26  0 - 44 U/L Final   Alkaline Phosphatase 01/23/2023 80  38 - 126 U/L Final   Total Bilirubin 01/23/2023 0.8  0.3 - 1.2 mg/dL Final   GFR, Estimated 01/23/2023 >60  >60 mL/min Final   Comment: (NOTE) Calculated using the CKD-EPI Creatinine Equation (2021)    Anion gap 01/23/2023 15  5 - 15 Final   Performed at [facility], [address], [place], [place] [address]   Alcohol, Ethyl (B) 01/23/2023 332 (HH)  <10 mg/dL Final   Comment: CRITICAL RESULT CALLED TO, READ BACK BY AND VERIFIED WITH CARPENTER, D. RN AT 1457 ON 01/23/23. FA (NOTE) Lowest detectable limit for serum alcohol is 10 mg/dL.  For medical purposes only. Performed at [facility], [address] [place], [place] [address]    WBC 01/23/2023 4.4  4.0 - 10.5 K/uL Final   RBC 01/23/2023 4.81  3.87 - 5.11 MIL/uL Final   Hemoglobin 01/23/2023 13.5  12.0 - 15.0 g/dL Final   HCT 62/95/2841 40.9  36.0 - 46.0 % Final   MCV 01/23/2023 85.0  80.0 - 100.0 fL Final   MCH 01/23/2023 28.1  26.0 - 34.0 pg Final   MCHC 01/23/2023 33.0  30.0 - 36.0 g/dL Final   RDW 32/44/0102 15.6 (H)  11.5 - 15.5 % Final   Platelets 01/23/2023 263  150 - 400 K/uL Final   nRBC 01/23/2023 0.0  0.0 - 0.2 % Final   Neutrophils Relative % 01/23/2023 44  % Final   Neutro Abs 01/23/2023 1.9  1.7 - 7.7 K/uL Final   Lymphocytes Relative 01/23/2023 50  %  Final   Lymphs Abs 01/23/2023 2.2  0.7 - 4.0 K/uL Final   Monocytes Relative 01/23/2023 4  % Final   Monocytes Absolute 01/23/2023 0.2  0.1 - 1.0 K/uL Final   Eosinophils Relative 01/23/2023 0  % Final   Eosinophils Absolute 01/23/2023 0.0  0.0 - 0.5 K/uL Final   Basophils Relative 01/23/2023 2  % Final   Basophils Absolute 01/23/2023 0.1  0.0 - 0.1 K/uL Final   Immature Granulocytes 01/23/2023 0  % Final   Abs Immature Granulocytes 01/23/2023 0.00  0.00 - 0.07 K/uL Final   Performed at [facility], [address], [place], [place] [address]   hCG, Conley Rolls, Quant, S 01/23/2023 1  <5 mIU/mL Final   Comment:          GEST. AGE      CONC.  (mIU/mL)   <=1 WEEK        5 - 50     2 WEEKS       50 - 500     3 WEEKS       100 - 10,000     4 WEEKS     1,000 - 30,000     5 WEEKS     3,500 - 115,000   6-8 WEEKS     12,000 - 270,000    12 WEEKS     15,000 - 220,000        FEMALE AND NON-PREGNANT FEMALE:     LESS THAN 5 mIU/mL Performed at [facility], [address] [place], [place] [address]    Color, Urine 01/23/2023 STRAW (A)  YELLOW Final   APPearance 01/23/2023 CLEAR  CLEAR Final  Specific Gravity, Urine 01/23/2023 1.005  1.005 - 1.030 Final   pH 01/23/2023 6.0  5.0 - 8.0 Final   Glucose, UA 01/23/2023 NEGATIVE  NEGATIVE mg/dL Final   Hgb urine dipstick 01/23/2023 SMALL (A)  NEGATIVE Final   Bilirubin Urine 01/23/2023 NEGATIVE  NEGATIVE Final   Ketones, ur 01/23/2023 NEGATIVE  NEGATIVE mg/dL Final   Protein, ur 78/29/5621 NEGATIVE  NEGATIVE mg/dL Final   Nitrite 30/86/5784 NEGATIVE  NEGATIVE Final   Leukocytes,Ua 01/23/2023 NEGATIVE  NEGATIVE Final   RBC / HPF 01/23/2023 0-5  0 - 5 RBC/hpf Final   WBC, UA 01/23/2023 0-5  0 - 5 WBC/hpf Final   Bacteria, UA 01/23/2023 RARE (A)  NONE SEEN Final   Squamous Epithelial / HPF 01/23/2023 0-5  0 - 5 /HPF Final   Performed at [facility], [address] [place], [place] [address]    Opiates 01/23/2023 NONE DETECTED  NONE DETECTED Final   Cocaine 01/23/2023 NONE DETECTED  NONE DETECTED Final   Benzodiazepines 01/23/2023 POSITIVE (A)  NONE DETECTED Final   Amphetamines 01/23/2023 NONE DETECTED  NONE DETECTED Final   Tetrahydrocannabinol 01/23/2023 NONE DETECTED  NONE DETECTED Final   Barbiturates 01/23/2023 NONE DETECTED  NONE DETECTED Final   Comment: (NOTE) DRUG SCREEN FOR MEDICAL PURPOSES ONLY.  IF CONFIRMATION IS NEEDED FOR ANY PURPOSE, NOTIFY LAB WITHIN 5 DAYS.  LOWEST DETECTABLE LIMITS FOR URINE DRUG SCREEN Drug Class                     Cutoff (ng/mL) Amphetamine and metabolites    1000 Barbiturate and metabolites    200 Benzodiazepine                 200 Opiates and metabolites        300 Cocaine and metabolites        300 THC                            50 Performed at [facility], [address] [place], [place] [address]     Blood Alcohol level:  Lab Results  Component Value Date   ETH 332 [facility]) 01/23/2023   ETH <10 07/14/2022    Metabolic Disorder Labs: Lab Results  Component Value Date   HGBA1C 5.1 07/24/2022   No results found for: "PROLACTIN" Lab Results  Component Value Date   CHOL 204 (H) 07/24/2022   TRIG 92.0 07/24/2022   HDL 66.10 07/24/2022   CHOLHDL 3 07/24/2022   VLDL 18.4 07/24/2022   LDLCALC 119 (H) 07/24/2022   LDLCALC 75 12/06/2020    Physical Findings   CAGE-AID    Flowsheet Row ED to Hosp-Admission (Discharged) from 12/20/2021 in [place] [facility] Medical Unit  CAGE-AID Score 4      PHQ2-9    Flowsheet Row ED from 01/24/2023 in [facility] Office Visit from 12/06/2020 in [facility] Office Visit from 07/02/2019 in [facility]  PHQ-2 Total Score 4 0 0  PHQ-9 Total Score 20 -- 0      Flowsheet Row ED from 01/24/2023 in [facility] ED from 01/23/2023 in [facility]  Emergency Department at [facility] ED from 07/14/2022 in [facility] Emergency Department at [facility]  C-SSRS RISK CATEGORY No Risk No Risk No Risk  Musculoskeletal  Strength & Muscle Tone: within normal limits Gait & Station: normal Patient leans: N/A  Psychiatric Specialty Exam  Presentation  General Appearance:  Appropriate for Environment  Eye Contact: Good  Speech: Normal Rate  Speech Volume: Normal  Handedness: Right   Mood and Affect  Mood: Depressed  Affect: Congruent; Depressed   Thought Process  Thought Processes: Coherent  Descriptions of Associations:Intact  Orientation:Full (Time, Place and Person)  Thought Content:Logical     Hallucinations:Hallucinations: None  Ideas of Reference:None  Suicidal Thoughts:Suicidal Thoughts: No  Homicidal Thoughts:Homicidal Thoughts: No   Sensorium  Memory: Immediate Good; Recent Good; Remote Good  Judgment: Fair  Insight: Fair   Art therapist  Concentration: Good  Attention Span: Good  Recall: Good  Fund of Knowledge: Good  Language: Good   Psychomotor Activity  Psychomotor Activity: Psychomotor Activity: Normal   Assets  Assets: Desire for Improvement; Social Support; Financial Resources/Insurance   Sleep  Sleep: Sleep: Fair (Slept for a long period but quality sleep was fair) Number of Hours of Sleep: 10   No data recorded   Physical Exam  Physical Exam Vitals and nursing note reviewed.  Constitutional:      General: She is not in acute distress. HENT:     Head: Normocephalic and atraumatic.  Pulmonary:     Effort: Pulmonary effort is normal.  Musculoskeletal:     Comments: Pain with range of motion of spinal cord including twisting and lateral flexion.  Some pain to palpation over the right superior latissimus muscle.  Neurological:     General: No focal deficit present.     Mental Status: She is alert.     Gait: Gait  normal.    Review of Systems  Constitutional:  Negative for chills, diaphoresis, fever and weight loss.  Respiratory:  Negative for cough and shortness of breath.   Cardiovascular:  Negative for chest pain.  Gastrointestinal:  Positive for heartburn. Negative for abdominal pain, constipation, diarrhea, nausea and vomiting.  Genitourinary:  Negative for dysuria, flank pain and hematuria.  Musculoskeletal:  Positive for back pain.  Neurological:  Negative for tingling, sensory change and focal weakness.   Blood pressure 116/76, pulse (!) 110, temperature 97.9 F (36.6 C), temperature source Oral, resp. rate 16, last menstrual period 06/19/2013, SpO2 100%. There is no height or weight on file to calculate BMI.  Treatment Plan Summary: Daily contact with patient to assess and evaluate symptoms and progress in treatment and Medication management  # Alcohol use disorder, severe  alcohol withdrawal without complication CIWA scores of 3, 4 in the last 12 hours.  She reports subjective improvement.  She is report no side effects with the naltrexone.  [facility] including CDIOP. Continue Ativan fixed dose taper until 10/12 Continue naltrexone 25 mg once daily for alcohol dependence Referral to [facility]OP at [facility]  # MDD  GAD  PTSD Patient noticed some sleep improvement with mirtazapine.  She does not describe any suicidal thoughts today.  She does have some excessive sleepiness though and continued lack of appetite. Plan: Continue mirtazapine 7.5 mg once at night for MDD, GAD, PTSD Plan initiate trauma focused therapy at [facility] Will safety plan prior to discharge with son  # Acute back pain, continued  acute epigastric pain, improved No red flag signs or symptoms.  Has not experienced this before.  May be referred back pain related gastritis.  Normal EKG. Responding to ibuprofen and  Tylenol.  Appears to have responded to  the twice daily PPI and famotidine for the presumed gastritis.  Her back pain today was worsened by movements and reproduce by palpation suggesting MSK.  We discussed that she needs to continue to immobilize this area but not to the point that is painful. Continue pain management with ibuprofen and Tylenol Continue pantoprazole 40 mg twice daily before meals for gastritis Continue famotidine 20 mg twice daily for gastris Monitor symptoms for improvement with low threshold to further workup for pancreatitis or other acute pathologies  Disposition: tomorrow pending fixed dose benzo taper.   Meryl Dare, MD PGY-1 Psychiatry Resident 01/26/2023, 5:24 PM

## 2023-01-27 ENCOUNTER — Telehealth: Payer: Self-pay | Admitting: Physician Assistant

## 2023-01-27 DIAGNOSIS — F411 Generalized anxiety disorder: Secondary | ICD-10-CM | POA: Diagnosis not present

## 2023-01-27 DIAGNOSIS — F332 Major depressive disorder, recurrent severe without psychotic features: Secondary | ICD-10-CM | POA: Diagnosis not present

## 2023-01-27 DIAGNOSIS — F10230 Alcohol dependence with withdrawal, uncomplicated: Secondary | ICD-10-CM | POA: Diagnosis not present

## 2023-01-27 DIAGNOSIS — F10229 Alcohol dependence with intoxication, unspecified: Secondary | ICD-10-CM | POA: Diagnosis not present

## 2023-01-27 MED ORDER — MIRTAZAPINE 7.5 MG PO TABS: 7.5000 mg | ORAL_TABLET | Freq: Every day | ORAL | 1 refills | Status: DC

## 2023-01-27 MED ORDER — NALTREXONE HCL 50 MG PO TABS: 25.0000 mg | ORAL_TABLET | Freq: Every day | ORAL | 0 refills | Status: AC

## 2023-01-27 MED ORDER — NALTREXONE HCL 50 MG PO TABS: 25.0000 mg | ORAL_TABLET | Freq: Every day | ORAL | 1 refills | Status: DC

## 2023-01-27 MED ORDER — MIRTAZAPINE 7.5 MG PO TABS: 7.5000 mg | ORAL_TABLET | Freq: Every day | ORAL | 0 refills | Status: DC
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
## 2023-01-27 NOTE — ED Notes (Addendum)
Patient A&O x 4, ambulatory. Patient discharged in no acute distress. Patient denied SI/HI, A/VH upon discharge. Patient verbalized understanding of all discharge instructions explained by staff, to include follow up appointments, RX's and safety plan. Patient reported mood 10/10.  Pt belongings returned to patient from locker #  intact. Patient escorted to lobby via staff for transport to destination. Safety maintained.

## 2023-01-27 NOTE — ED Notes (Signed)
SOMEONE JUST DROPPED OFF KEYS FOR PT IN THE MORNING MHT PUT THEM IN HER LOCKER.
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
## 2023-01-27 NOTE — Telephone Encounter (Signed)
Dr Ginny Forth called today to cancel Colonoscopy for 01/29/23- just got out of ETOH detox  Will need to be called next week to get her  rescheduled - thank you.

## 2023-01-27 NOTE — ED Notes (Signed)
Patient is sleeping. Respirations equal and unlabored, skin warm and dry. No change in assessment or acuity. Routine safety checks conducted according to facility protocol. Will continue to monitor for safety.   

## 2023-01-27 NOTE — ED Notes (Signed)
Patient A&Ox4. Denies intent to harm self/others when asked. Denies A/VH. Patient denies any physical complaints when asked. No acute distress noted. Support and encouragement provided. Routine safety checks conducted according to facility protocol. Encouraged patient to notify staff if thoughts of harm toward self or others arise. Patient verbalize understanding and agreement. Will continue to monitor for safety.    

## 2023-01-27 NOTE — Group Note (Signed)
Group Topic: Wellness  Group Date: 01/27/2023 Start Time: 1000 End Time: 1015 Facilitators: Priscille Kluver, NT  Department: [facility]  Number of Participants: 3  Group Focus: feeling awareness/expression Treatment Modality:  Patient-Centered Therapy Interventions utilized were story telling Purpose: express feelings  Name: Danielle Reilly Date of Birth: 1975/05/12  MR: [id number]    Level of Participation: Pt was discharged.   Patients Problems:  Patient Active Problem List   Diagnosis Date Noted   Alcohol intoxication in relapsed alcoholic (HCC) 01/24/2023   Alcohol abuse with other alcohol-induced disorder (HCC) 01/23/2023   Chronic post-traumatic stress disorder (PTSD) 01/23/2023   Major depressive disorder, single episode, severe without psychotic features (HCC) 01/23/2023   Generalized anxiety disorder 12/06/2020   History of gastritis 12/06/2020   History of alcohol abuse 12/06/2020   GERD (gastroesophageal reflux disease) 06/17/2019

## 2023-01-29 ENCOUNTER — Encounter: Payer: Medicaid Other | Admitting: Internal Medicine

## 2023-01-29 NOTE — Telephone Encounter (Signed)
Patient was scheduled for a direct colonoscopy. Would you please reach out to her to reschedule with Dr Leonides Schanz?  Thank you

## 2023-01-30 ENCOUNTER — Other Ambulatory Visit: Payer: Self-pay

## 2023-01-30 MED ORDER — GOLYTELY 236 G PO SOLR: ORAL | 0 refills | Status: DC

## 2023-01-30 NOTE — Telephone Encounter (Signed)
New instructions with Rx for the prep mailed to the patient.

## 2023-01-30 NOTE — Telephone Encounter (Signed)
PT rescheduled colonoscopy for 11/4

## 2023-02-13 ENCOUNTER — Telehealth: Payer: Self-pay | Admitting: Internal Medicine

## 2023-02-13 NOTE — Telephone Encounter (Signed)
Inbound call from patient stating that she is scheduled for a colonoscopy on 11/4 at 3:00 with Dr. Leonides Schanz and just found out she has a bacterial infection and was put on a medication for 7 days. Patient is requesting a call to discuss if she can still have procedure. Please advise.

## 2023-02-13 NOTE — Telephone Encounter (Signed)
Spoke with patient all questions answered ok to proceed

## 2023-02-19 ENCOUNTER — Encounter: Payer: Self-pay | Admitting: Internal Medicine

## 2023-02-19 ENCOUNTER — Ambulatory Visit: Payer: Medicaid Other | Admitting: Internal Medicine

## 2023-02-19 VITALS — BP 122/70 | HR 84 | Temp 97.7°F | Resp 22 | Ht 65.0 in | Wt 180.0 lb

## 2023-02-19 DIAGNOSIS — D120 Benign neoplasm of cecum: Secondary | ICD-10-CM

## 2023-02-19 DIAGNOSIS — Z1211 Encounter for screening for malignant neoplasm of colon: Secondary | ICD-10-CM

## 2023-02-19 MED ORDER — SODIUM CHLORIDE 0.9 % IV SOLN: 500.0000 mL | Freq: Once | INTRAVENOUS | Status: DC

## 2023-02-19 NOTE — Op Note (Addendum)
[facility] Patient Name: Danielle Reilly Procedure Date: 02/19/2023 3:01 PM MRN: [id number] Endoscopist: Particia Lather , , [id number] Age: 47 Referring MD:  Date of Birth: 1976/02/16 Gender: Female Account #: [id number] Procedure:                Colonoscopy Indications:              Screening for colorectal malignant neoplasm, This                            is the patient's first colonoscopy Medicines:                Monitored Anesthesia Care Procedure:                Pre-Anesthesia Assessment:                           - Prior to the procedure, a History and Physical                            was performed, and patient medications and                            allergies were reviewed. The patient's tolerance of                            previous anesthesia was also reviewed. The risks                            and benefits of the procedure and the sedation                            options and risks were discussed with the patient.                            All questions were answered, and informed consent                            was obtained. Prior Anticoagulants: The patient has                            taken no anticoagulant or antiplatelet agents. ASA                            Grade Assessment: II - A patient with mild systemic                            disease. After reviewing the risks and benefits,                            the patient was deemed in satisfactory condition to                            undergo the procedure.  After obtaining informed consent, the colonoscope                            was passed under direct vision. Throughout the                            procedure, the patient's blood pressure, pulse, and                            oxygen saturations were monitored continuously. The                            Olympus Scope SN [id number] was introduced through the                            anus and advanced to  the the terminal ileum. The                            colonoscopy was performed without difficulty. The                            patient tolerated the procedure well. The quality                            of the bowel preparation was excellent. The                            terminal ileum, ileocecal valve, appendiceal                            orifice, and rectum were photographed. Scope In: 3:09:57 PM Scope Out: 3:23:22 PM Scope Withdrawal Time: 0 hours 8 minutes 39 seconds  Total Procedure Duration: 0 hours 13 minutes 25 seconds  Findings:                 The terminal ileum appeared normal.                           A 5 mm polyp was found in the cecum. The polyp was                            sessile. The polyp was removed with a cold snare.                            Resection and retrieval were complete.                           Non-bleeding internal hemorrhoids were found during                            retroflexion. Complications:            No immediate complications. Estimated Blood Loss:     Estimated blood loss was minimal. Impression:               -  The examined portion of the ileum was normal.                           - One 5 mm polyp in the cecum, removed with a cold                            snare. Resected and retrieved.                           - Non-bleeding internal hemorrhoids. Recommendation:           - Discharge patient to home (with escort).                           - Await pathology results.                           - Will plan for abdominal ultrasound with                            elastography.                           - Will arrange for follow up in 3-4 months for her                            fatty liver and other GI symptoms.                           - The findings and recommendations were discussed                            with the patient. Dr Particia Lather "Alan Ripper" Leonides Schanz,  02/19/2023 3:27:25 PM

## 2023-02-19 NOTE — Progress Notes (Signed)
Pt's states no medical or surgical changes since previsit or office visit. 

## 2023-02-19 NOTE — Progress Notes (Signed)
GASTROENTEROLOGY PROCEDURE H&P NOTE   Primary Care Physician: Georgina Quint, MD    Reason for Procedure:   Colon cancer screening  Plan:    Colonoscopy  Patient is appropriate for endoscopic procedure(s) in the ambulatory ([facility]) setting.  The nature of the procedure, as well as the risks, benefits, and alternatives were carefully and thoroughly reviewed with the patient. Ample time for discussion and questions allowed. The patient understood, was satisfied, and agreed to proceed.     HPI: Danielle Reilly is a 47 y.o. female who presents for colonoscopy for colon cancer screening. Denies blood in stools, changes in bowel habits, or unintentional weight loss. Denies family history of colon cancer.  Past Medical History:  Diagnosis Date   Acute pancreatitis    Allergy    SEASONAL   Anemia    Anxiety    "A LITTLE"   Depression    Gastritis    GERD (gastroesophageal reflux disease)    tums as needed   Hemoperitoneum 12/25/2010   Nausea and vomiting 06/17/2019   Substance abuse (HCC)    ETOH ABUSE   SVD (spontaneous vaginal delivery)    x 1   Toothache 01/29/2013    Past Surgical History:  Procedure Laterality Date   BILATERAL SALPINGECTOMY N/A 06/19/2013   Procedure: BILATERAL SALPINGECTOMY;  Surgeon: Willodean Rosenthal, MD;  Location: [facility];  Service: Gynecology;  Laterality: N/A;   DILITATION & CURRETTAGE/HYSTROSCOPY WITH NOVASURE ABLATION N/A 04/04/2013   Procedure: DILATATION & CURETTAGE/HYSTEROSCOPY WITH Attempted NOVASURE ABLATION ;  Surgeon: Willodean Rosenthal, MD;  Location: [facility];  Service: Gynecology;  Laterality: N/A;   HYSTEROSCOPY N/A 05/26/2013   Procedure: UNSUCCESSFUL HYSTEROSCOPY WITH HYDROTHERMAL ABLATION;  Surgeon: Willodean Rosenthal, MD;  Location: [facility];  Service: Gynecology;  Laterality: N/A;   LAPAROSCOPY  12/25/2010   Procedure: LAPAROSCOPY OPERATIVE;  Surgeon: Roseanna Rainbow, MD;  Location: [facility];  Service: Gynecology;   Laterality: N/A;  Operative laparoscopy /cauterization of right hemorrhagic cyst wall. Removal of belly rings.   LAPAROSCOPY FOR ECTOPIC PREGNANCY     NOVASURE ABLATION N/A 05/26/2013   Procedure: UNSUCCESSFUL NOVASURE ABLATION;  Surgeon: Willodean Rosenthal, MD;  Location: [facility];  Service: Gynecology;  Laterality: N/A;   uterine biopsy     VAGINAL HYSTERECTOMY N/A 06/19/2013   Procedure:  TOTAL VAGINAL HYSTERECTOMY with bilateral salpingectomy;  Surgeon: Willodean Rosenthal, MD;  Location: [facility];  Service: Gynecology;  Laterality: N/A;    Prior to Admission medications   Medication Sig Start Date End Date Taking? Authorizing Provider  fluticasone (FLONASE) 50 MCG/ACT nasal spray Place 1 spray into both nostrils daily as needed for allergies or rhinitis.   Yes [provider]  mirtazapine (REMERON) 7.5 MG tablet Take 1 tablet (7.5 mg total) by mouth at bedtime. 01/27/23 02/26/23 Yes Park Pope, MD  omeprazole (PRILOSEC) 40 MG capsule Take 1 capsule (40 mg total) by mouth in the morning and at bedtime. Patient taking differently: Take 40 mg by mouth as needed. 07/24/22  Yes [name], Eilleen Kempf, MD  Multiple Vitamin (MULTIVITAMINS PO) Take 1 tablet by mouth daily.    [provider]  naltrexone (DEPADE) 50 MG tablet Take 0.5 tablets (25 mg total) by mouth daily. 01/27/23 02/26/23  Park Pope, MD  famotidine (PEPCID) 20 MG tablet Take 1 tablet (20 mg total) by mouth daily. Patient not taking: Reported on 10/28/2019 05/19/19 04/06/20  Jacalyn Lefevre, MD    Current Outpatient Medications  Medication Sig Dispense Refill   fluticasone [facility])  50 MCG/ACT nasal spray Place 1 spray into both nostrils daily as needed for allergies or rhinitis.     mirtazapine (REMERON) 7.5 MG tablet Take 1 tablet (7.5 mg total) by mouth at bedtime. 30 tablet 0   omeprazole (PRILOSEC) 40 MG capsule Take 1 capsule (40 mg total) by mouth in the morning and at bedtime. (Patient taking differently:  Take 40 mg by mouth as needed.) 60 capsule 0   Multiple Vitamin (MULTIVITAMINS PO) Take 1 tablet by mouth daily.     naltrexone (DEPADE) 50 MG tablet Take 0.5 tablets (25 mg total) by mouth daily. 15 tablet 0   Current Facility-Administered Medications  Medication Dose Route Frequency Provider Last Rate Last Admin   0.9 %  sodium chloride infusion  500 mL Intravenous Once Imogene Burn, MD        Allergies as of 02/19/2023 - Review Complete 02/19/2023  Allergen Reaction Noted   Fluconazole Hives 03/18/2021   Hydrocodone Nausea And Vomiting 10/22/2020    Family History  Problem Relation Age of Onset   Drug abuse Mother    Arthritis Maternal Aunt    Cancer Maternal Aunt    Drug abuse Maternal Aunt    Arthritis Maternal Uncle    Alcohol abuse Maternal Uncle    Cancer Maternal Uncle    Drug abuse Maternal Uncle    Cancer Maternal Grandmother    Alcohol abuse Maternal Grandfather    Cancer Maternal Grandfather    Colon cancer Neg Hx    Colon polyps Neg Hx    Crohn's disease Neg Hx    Esophageal cancer Neg Hx    Rectal cancer Neg Hx    Stomach cancer Neg Hx    Ulcerative colitis Neg Hx     Social History   Socioeconomic History   Marital status: Divorced    Spouse name: Not on file   Number of children: Not on file   Years of education: Not on file   Highest education level: Not on file  Occupational History   Not on file  Tobacco Use   Smoking status: Never   Smokeless tobacco: Never  Vaping Use   Vaping status: Never Used  Substance and Sexual Activity   Alcohol use: Not Currently    Comment: STOPPED 6 MONTHS AGO   Drug use: Not Currently   Sexual activity: Yes    Birth control/protection: Surgical  Other Topics Concern   Not on file  Social History Narrative   Pt lives in [place] with her son.  She is unemployed.  She is not followed by a psychiatrist.   Social Determinants of Health   Financial Resource Strain: Not on file  Food Insecurity: No Food  Insecurity (01/24/2023)   Hunger Vital Sign    Worried About Running Out of Food in the Last Year: Never true    Ran Out of Food in the Last Year: Never true  Transportation Needs: No Transportation Needs (01/24/2023)   PRAPARE - Administrator, Civil Service (Medical): No    Lack of Transportation (Non-Medical): No  Physical Activity: Not on file  Stress: Not on file  Social Connections: Not on file  Intimate Partner Violence: Unknown (01/24/2023)   Humiliation, Afraid, Rape, and Kick questionnaire    Fear of Current or Ex-Partner: No    Emotionally Abused: No    Physically Abused: No    Sexually Abused: Not on file    Physical Exam: Vital signs in last 24 hours:  BP 123/87   Pulse 84   Temp 97.7 F (36.5 C)   Ht 5\' 5"  (1.651 m)   Wt 180 lb (81.6 kg)   LMP 06/19/2013   SpO2 99%   BMI 29.95 kg/m  GEN: NAD EYE: Sclerae anicteric ENT: MMM CV: Non-tachycardic Pulm: No increased work of breathing GI: Soft, NT/ND NEURO:  Alert & Oriented   Eulah Pont, MD [facility]  02/19/2023 2:55 PM

## 2023-02-19 NOTE — Progress Notes (Signed)
Pt resting comfortably. VSS. Airway intact. SBAR complete to RN. All questions answered.   

## 2023-02-19 NOTE — Progress Notes (Signed)
Called to room to assist during endoscopic procedure.  Patient ID and intended procedure confirmed with present staff. Received instructions for my participation in the procedure from the performing physician.  

## 2023-02-19 NOTE — Patient Instructions (Addendum)
Resume previous diet Continue present medications Await pathology results  Handouts/information given for polyps, hemorrhoids   YOU HAD AN ENDOSCOPIC PROCEDURE TODAY AT THE [facility]:   Refer to the procedure report that was given to you for any specific questions about what was found during the examination.  If the procedure report does not answer your questions, please call your gastroenterologist to clarify.  If you requested that your care partner not be given the details of your procedure findings, then the procedure report has been included in a sealed envelope for you to review at your convenience later.  YOU SHOULD EXPECT: Some feelings of bloating in the abdomen. Passage of more gas than usual.  Walking can help get rid of the air that was put into your GI tract during the procedure and reduce the bloating. If you had a lower endoscopy (such as a colonoscopy or flexible sigmoidoscopy) you may notice spotting of blood in your stool or on the toilet paper. If you underwent a bowel prep for your procedure, you may not have a normal bowel movement for a few days.  Please Note:  You might notice some irritation and congestion in your nose or some drainage.  This is from the oxygen used during your procedure.  There is no need for concern and it should clear up in a day or so.  SYMPTOMS TO REPORT IMMEDIATELY:  Following lower endoscopy (colonoscopy or flexible sigmoidoscopy):  Excessive amounts of blood in the stool  Significant tenderness or worsening of abdominal pains  Swelling of the abdomen that is new, acute  Fever of 100F or higher  For urgent or emergent issues, a gastroenterologist can be reached at any hour by calling [phone]. Do not use MyChart messaging for urgent concerns.    DIET:  We do recommend a small meal at first, but then you may proceed to your regular diet.  Drink plenty of fluids but you should avoid alcoholic beverages for 24  hours.  ACTIVITY:  You should plan to take it easy for the rest of today and you should NOT DRIVE or use heavy machinery until tomorrow (because of the sedation medicines used during the test).    FOLLOW UP: Our staff will call the number listed on your records the next business day following your procedure.  We will call around 7:15- 8:00 am to check on you and address any questions or concerns that you may have regarding the information given to you following your procedure. If we do not reach you, we will leave a message.     If any biopsies were taken you will be contacted by phone or by letter within the next 1-3 weeks.  Please call us at [phone] if you have not heard about the biopsies in 3 weeks.    SIGNATURES/CONFIDENTIALITY: You and/or your care partner have signed paperwork which will be entered into your electronic medical record.  These signatures attest to the fact that that the information above on your After Visit Summary has been reviewed and is understood.  Full responsibility of the confidentiality of this discharge information lies with you and/or your care-partner.

## 2023-02-20 ENCOUNTER — Telehealth: Payer: Self-pay | Admitting: *Deleted

## 2023-02-20 NOTE — Telephone Encounter (Signed)
  Follow up Call-     02/19/2023    2:28 PM 04/13/2021    3:02 PM  Call back number  Post procedure Call Back phone  # [phone]  Permission to leave phone message Yes Yes     Patient questions:  Do you have a fever, pain , or abdominal swelling? No. Pain Score  0 *  Have you tolerated food without any problems? Yes.    Have you been able to return to your normal activities? Yes.    Do you have any questions about your discharge instructions: Diet   No. Medications  No. Follow up visit  No.  Do you have questions or concerns about your Care? No.  Actions: * If pain score is 4 or above: No action needed, pain <4.

## 2023-02-21 ENCOUNTER — Other Ambulatory Visit: Payer: Self-pay

## 2023-02-21 ENCOUNTER — Telehealth: Payer: Self-pay

## 2023-02-21 DIAGNOSIS — R1013 Epigastric pain: Secondary | ICD-10-CM

## 2023-02-21 DIAGNOSIS — K219 Gastro-esophageal reflux disease without esophagitis: Secondary | ICD-10-CM

## 2023-02-21 NOTE — Telephone Encounter (Signed)
Called patient no answer left message on voice mail notifying patient she is scheduled for a abdominal US on 02/23/23 at 8 am left phone number for radiology.

## 2023-02-22 ENCOUNTER — Encounter: Payer: Self-pay | Admitting: Internal Medicine

## 2023-02-22 LAB — SURGICAL PATHOLOGY

## 2023-02-23 ENCOUNTER — Ambulatory Visit (HOSPITAL_COMMUNITY): Payer: Medicaid Other

## 2023-02-28 ENCOUNTER — Ambulatory Visit (HOSPITAL_COMMUNITY): Admission: RE | Admit: 2023-02-28 | Payer: Medicaid Other | Source: Ambulatory Visit

## 2023-03-08 ENCOUNTER — Ambulatory Visit (HOSPITAL_BASED_OUTPATIENT_CLINIC_OR_DEPARTMENT_OTHER): Payer: Medicaid Other | Admitting: Psychiatry

## 2023-03-08 ENCOUNTER — Encounter (HOSPITAL_COMMUNITY): Payer: Self-pay | Admitting: Psychiatry

## 2023-03-08 VITALS — BP 132/71 | HR 100 | Temp 97.9°F | Ht 64.5 in | Wt 182.0 lb

## 2023-03-08 DIAGNOSIS — T1490XA Injury, unspecified, initial encounter: Secondary | ICD-10-CM | POA: Diagnosis not present

## 2023-03-08 DIAGNOSIS — F4312 Post-traumatic stress disorder, chronic: Secondary | ICD-10-CM

## 2023-03-08 DIAGNOSIS — F10188 Alcohol abuse with other alcohol-induced disorder: Secondary | ICD-10-CM

## 2023-03-08 DIAGNOSIS — F322 Major depressive disorder, single episode, severe without psychotic features: Secondary | ICD-10-CM

## 2023-03-08 MED ORDER — FLUOXETINE HCL 10 MG PO CAPS: 10.0000 mg | ORAL_CAPSULE | Freq: Every day | ORAL | 1 refills | Status: DC

## 2023-03-08 MED ORDER — NALTREXONE HCL 50 MG PO TABS
50.0000 mg | ORAL_TABLET | Freq: Every day | ORAL | 1 refills | Status: DC
Start: 2023-03-08 — End: 2023-04-26
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
## 2023-04-09 ENCOUNTER — Ambulatory Visit (INDEPENDENT_AMBULATORY_CARE_PROVIDER_SITE_OTHER): Payer: Medicaid Other | Admitting: Licensed Clinical Social Worker

## 2023-04-09 DIAGNOSIS — F4321 Adjustment disorder with depressed mood: Secondary | ICD-10-CM

## 2023-04-09 DIAGNOSIS — F4312 Post-traumatic stress disorder, chronic: Secondary | ICD-10-CM

## 2023-04-09 DIAGNOSIS — F331 Major depressive disorder, recurrent, moderate: Secondary | ICD-10-CM | POA: Diagnosis not present

## 2023-04-09 DIAGNOSIS — F1020 Alcohol dependence, uncomplicated: Secondary | ICD-10-CM | POA: Diagnosis not present
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
## 2023-04-26 ENCOUNTER — Encounter (HOSPITAL_COMMUNITY): Payer: Self-pay | Admitting: Psychiatry

## 2023-04-26 ENCOUNTER — Ambulatory Visit (HOSPITAL_BASED_OUTPATIENT_CLINIC_OR_DEPARTMENT_OTHER): Payer: MEDICAID | Admitting: Psychiatry

## 2023-04-26 VITALS — BP 132/80 | HR 100 | Ht 65.0 in | Wt 188.0 lb

## 2023-04-26 DIAGNOSIS — F4312 Post-traumatic stress disorder, chronic: Secondary | ICD-10-CM | POA: Diagnosis not present

## 2023-04-26 DIAGNOSIS — Z62819 Personal history of unspecified abuse in childhood: Secondary | ICD-10-CM | POA: Diagnosis not present

## 2023-04-26 DIAGNOSIS — T1490XA Injury, unspecified, initial encounter: Secondary | ICD-10-CM

## 2023-04-26 DIAGNOSIS — F10188 Alcohol abuse with other alcohol-induced disorder: Secondary | ICD-10-CM | POA: Diagnosis not present

## 2023-04-26 DIAGNOSIS — F322 Major depressive disorder, single episode, severe without psychotic features: Secondary | ICD-10-CM | POA: Diagnosis not present

## 2023-04-26 MED ORDER — FLUOXETINE HCL 10 MG PO CAPS: 10.0000 mg | ORAL_CAPSULE | Freq: Every day | ORAL | 1 refills | Status: DC

## 2023-04-26 MED ORDER — NALTREXONE HCL 50 MG PO TABS: 50.0000 mg | ORAL_TABLET | Freq: Every day | ORAL | 1 refills | Status: DC
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
## 2023-05-09 ENCOUNTER — Ambulatory Visit (HOSPITAL_COMMUNITY): Payer: Medicaid Other | Admitting: Licensed Clinical Social Worker

## 2023-05-09 ENCOUNTER — Telehealth (HOSPITAL_COMMUNITY): Payer: Self-pay | Admitting: Licensed Clinical Social Worker

## 2023-05-29 ENCOUNTER — Ambulatory Visit (INDEPENDENT_AMBULATORY_CARE_PROVIDER_SITE_OTHER): Payer: MEDICAID | Admitting: Licensed Clinical Social Worker

## 2023-05-29 DIAGNOSIS — F331 Major depressive disorder, recurrent, moderate: Secondary | ICD-10-CM

## 2023-05-29 DIAGNOSIS — F1020 Alcohol dependence, uncomplicated: Secondary | ICD-10-CM

## 2023-05-29 DIAGNOSIS — F4312 Post-traumatic stress disorder, chronic: Secondary | ICD-10-CM | POA: Diagnosis not present

## 2023-05-29 DIAGNOSIS — F4321 Adjustment disorder with depressed mood: Secondary | ICD-10-CM | POA: Diagnosis not present

## 2023-05-29 NOTE — Progress Notes (Signed)
THERAPIST PROGRESS NOTE   Session Time:  2:07pm - 3:00pm   Location: Patient[facility] Provider: [facility]   Participation Level: Active    Behavioral Response: Alert, casually dressed, anxious mood/affect   Type of Therapy:  Individual Therapy   Treatment Goals addressed: Depression/anxiety management; Medication compliance; Monitoring alcohol use; Maintaining healthier boundaries with network   Progress Towards Goals: Progressing   Interventions: CBT: psychoeducation on boundaries, relapse prevention    Summary: Coryn Mosso is a 48 year old single AA female that presented for therapy appointment today with diagnosis of PTSD; Major depressive disorder, recurrent, moderate with anxious distress; Alcohol Use Disorder, moderate; and Unresolved Grief.  Suicidal/Homicidal: None; without intent or plan.    Therapist Response:  Clinician met with Jeannene for in-person therapy session and assessed for safety, sobriety, and medication compliance.  Lekeshia presented for appointment on time and was alert, oriented x5, with no evidence or self-report of active SI/HI or A/V H.  Karmela reported that she continues taking medication as prescribed and denied abuse of illicit substances.  Seher reported that she did end up binge drinking an unknown amount of alcohol 1 week ago.  She denied drinking enough to black out.  Clinician inquired about Staphany's emotional ratings today, as well as any significant changes in thoughts, feelings, or behavior since previous check-in.  Mahasin reported scores of 6/10 for depression, 6/10 for anxiety, and 4/10 for anger/irritability.  Talyah denied any panic attacks.  Ailey reported that a recent struggle was having an outburst on Friday when feeling frustrated by her sister's behavior, stating "She is unhealthy for me.  I struggle trying to have a relationship with her".  Clinician provided psychoeducation on subject of boundaries with Cherrelle today using a handout.  This  handout defined boundaries as the limits and rules that we set for ourselves within relationships, and featured a breakdown of the 3 common categories of boundaries (i.e. porous, rigid, and healthy), along with typical traits specific to each one for easy identification.  It was noted that most people have a mixture of different boundary types depending on setting, person, and culture.  Clinician tasked Haizley with identifying which types of boundaries she presently has within her own support system, the collective impact these boundaries have upon her mental health, and changes that could be made in order to more effectively communicate her mental health needs.  Intervention was effective, as evidenced by Neeti actively engaging in discussion on subject, reporting that this helped her build insight into several ongoing issues within her network that could be fueling drinking habit.  Cloma reported that she has porous boundaries with several family members like her sister and niece, which has made it difficult to say "No" to others, and sacrifice her own needs and wants.  Jaquel reported that when family conflict reaches a peak, she justifies alcohol binges to cope.  Tiffanye reported that she would plan to implement more rigid boundaries with 'toxic' family members like her sister in order to improve abstinence efforts, overall mental and physical health.  Clinician will continue to monitor.                     Plan: Follow up in 1 week.   Diagnosis: PTSD; Major depressive disorder, recurrent, moderate with anxious distress; Alcohol Use Disorder, moderate; and Unresolved Grief.  Collaboration of Care:   None required at this time.  Patient/Guardian was advised Release of Information must be obtained prior to any record release in order to collaborate their care with an outside provider. Patient/Guardian was advised if they have not already done so to contact the  registration department to sign all necessary forms in order for [place] to release information regarding their care.    Consent: Patient/Guardian gives verbal consent for treatment and assignment of benefits for services provided during this visit. Patient/Guardian expressed understanding and agreed to proceed.   Noralee Stain, LCSW, LCAS 05/29/23

## 2023-06-12 ENCOUNTER — Emergency Department (HOSPITAL_COMMUNITY): Payer: MEDICAID

## 2023-06-12 ENCOUNTER — Encounter (HOSPITAL_COMMUNITY): Payer: Self-pay | Admitting: Emergency Medicine

## 2023-06-12 ENCOUNTER — Ambulatory Visit (INDEPENDENT_AMBULATORY_CARE_PROVIDER_SITE_OTHER): Payer: MEDICAID | Admitting: Licensed Clinical Social Worker

## 2023-06-12 ENCOUNTER — Emergency Department (HOSPITAL_COMMUNITY)
Admission: EM | Admit: 2023-06-12 | Discharge: 2023-06-13 | Disposition: A | Discharge status: Home / Self Care | Payer: MEDICAID | Attending: Emergency Medicine | Admitting: Emergency Medicine

## 2023-06-12 ENCOUNTER — Other Ambulatory Visit: Payer: Self-pay

## 2023-06-12 DIAGNOSIS — F4312 Post-traumatic stress disorder, chronic: Secondary | ICD-10-CM

## 2023-06-12 DIAGNOSIS — F1020 Alcohol dependence, uncomplicated: Secondary | ICD-10-CM | POA: Diagnosis not present

## 2023-06-12 DIAGNOSIS — F331 Major depressive disorder, recurrent, moderate: Secondary | ICD-10-CM

## 2023-06-12 DIAGNOSIS — R1013 Epigastric pain: Secondary | ICD-10-CM | POA: Diagnosis not present

## 2023-06-12 DIAGNOSIS — F1010 Alcohol abuse, uncomplicated: Secondary | ICD-10-CM | POA: Diagnosis present

## 2023-06-12 DIAGNOSIS — F4321 Adjustment disorder with depressed mood: Secondary | ICD-10-CM | POA: Diagnosis not present

## 2023-06-12 DIAGNOSIS — E876 Hypokalemia: Secondary | ICD-10-CM | POA: Insufficient documentation

## 2023-06-12 LAB — COMPREHENSIVE METABOLIC PANEL
ALT: 21 U/L (ref 0–44)
AST: 45 U/L — ABNORMAL HIGH (ref 15–41)
Albumin: 4.2 g/dL (ref 3.5–5.0)
Alkaline Phosphatase: 64 U/L (ref 38–126)
Anion gap: 18 — ABNORMAL HIGH (ref 5–15)
BUN: <5 mg/dL — ABNORMAL LOW (ref 6–20)
CO2: 20 mmol/L — ABNORMAL LOW (ref 22–32)
Calcium: 9.5 mg/dL (ref 8.9–10.3)
Chloride: 100 mmol/L (ref 98–111)
Creatinine, Ser: 0.78 mg/dL (ref 0.44–1.00)
GFR, Estimated: >60 mL/min (ref >60)
Glucose, Bld: 124 mg/dL — ABNORMAL HIGH (ref 70–99)
Potassium: 3 mmol/L — ABNORMAL LOW (ref 3.5–5.1)
Sodium: 138 mmol/L (ref 135–145)
Total Bilirubin: 0.9 mg/dL (ref 0.0–1.2)
Total Protein: 7.9 g/dL (ref 6.5–8.1)

## 2023-06-12 LAB — URINALYSIS, ROUTINE W REFLEX MICROSCOPIC
Bilirubin Urine: NEGATIVE
Glucose, UA: NEGATIVE mg/dL
Ketones, ur: NEGATIVE mg/dL
Nitrite: NEGATIVE
Protein, ur: NEGATIVE mg/dL
Specific Gravity, Urine: 1.002 — ABNORMAL LOW (ref 1.005–1.030)
pH: 5 (ref 5.0–8.0)

## 2023-06-12 LAB — CBC
HCT: 37.1 % (ref 36.0–46.0)
Hemoglobin: 12.3 g/dL (ref 12.0–15.0)
MCH: 28 pg (ref 26.0–34.0)
MCHC: 33.2 g/dL (ref 30.0–36.0)
MCV: 84.5 fL (ref 80.0–100.0)
Platelets: 233 10*3/uL (ref 150–400)
RBC: 4.39 MIL/uL (ref 3.87–5.11)
RDW: 13.2 % (ref 11.5–15.5)
WBC: 6.9 10*3/uL (ref 4.0–10.5)
nRBC: 0 % (ref 0.0–0.2)

## 2023-06-12 LAB — ETHANOL: Alcohol, Ethyl (B): 92 mg/dL — ABNORMAL HIGH (ref <10)

## 2023-06-12 LAB — LIPASE, BLOOD: Lipase: 25 U/L (ref 11–51)

## 2023-06-12 MED ORDER — FOLIC ACID 1 MG PO TABS
1.0000 mg | ORAL_TABLET | Freq: Every day | ORAL | Status: DC
  Administered 2023-06-12 – 2023-06-13 (×2): 1 mg via ORAL
  Filled 2023-06-12 (×2): qty 1

## 2023-06-12 MED ORDER — THIAMINE MONONITRATE 100 MG PO TABS
100.0000 mg | ORAL_TABLET | Freq: Every day | ORAL | Status: DC
  Administered 2023-06-12 – 2023-06-13 (×2): 100 mg via ORAL
  Filled 2023-06-12 (×2): qty 1

## 2023-06-12 MED ORDER — IOHEXOL 350 MG/ML SOLN
75.0000 mL | Freq: Once | INTRAVENOUS | Status: AC | PRN
  Administered 2023-06-12: 75 mL via INTRAVENOUS

## 2023-06-12 MED ORDER — ADULT MULTIVITAMIN W/MINERALS CH
1.0000 | ORAL_TABLET | Freq: Once | ORAL | Status: AC
  Administered 2023-06-12: 1 via ORAL
  Filled 2023-06-12: qty 1

## 2023-06-12 MED ORDER — LORAZEPAM 1 MG PO TABS
1.0000 mg | ORAL_TABLET | Freq: Once | ORAL | Status: AC
  Administered 2023-06-12: 1 mg via ORAL
  Filled 2023-06-12: qty 1

## 2023-06-12 NOTE — ED Provider Triage Note (Signed)
 Emergency Medicine Provider Triage Evaluation Note  Danielle Reilly , a 48 y.o. female  was evaluated in triage.  Pt complains of abdominal pain.  Reports that she was 100 days sober until this past weekend when she began drinking.  States that she has drinking 1 pot of tequila every day for the last 3 days.  States last drink this morning at 9 AM.  Endorsing 1 episode of nausea and vomiting yesterday.  States he has a history of pancreatitis, has been developing abdominal pain over the last 48 hours.  Denies diarrhea, fevers, dysuria.  Reports she has gone into withdrawal before.  Patient tachycardic.  Provided Ativan.  Review of Systems  Positive:  Negative:   Physical Exam  BP 135/86 (BP Location: Right Arm)   Pulse (!) 108   Temp 98.5 F (36.9 C)   Resp 16   Ht 5\' 6"  (1.676 m)   Wt 77.1 kg   LMP 06/19/2013   SpO2 99%   BMI 27.44 kg/m  Gen:   Awake, no distress   Resp:  Normal effort  MSK:   Moves extremities without difficulty  Other:  Epigastric tenderness to palpation of abdomen  Medical Decision Making  Medically screening exam initiated at 7:12 PM.  Appropriate orders placed.  Arihana Analycia Khokhar was informed that the remainder of the evaluation will be completed by another provider, this initial triage assessment does not replace that evaluation, and the importance of remaining in the ED until their evaluation is complete.     Al Decant, PA-C 06/12/23 1913

## 2023-06-12 NOTE — ED Triage Notes (Signed)
 Pt reports ABD distention and cramping. Chronic ETOH abuse. Reports heavy drinking this past weekend. Last drink this morning around 11am. Hx of pancreatitis and denies ever needing a paracentesis.

## 2023-06-12 NOTE — Progress Notes (Signed)
 THERAPIST PROGRESS NOTE   Session Time:  2:09pm - 3:00pm   Location: [facility]   Participation Level: Active    Behavioral Response: Alert, casually dressed, anxious mood/affect   Type of Therapy:  Individual Therapy   Treatment Goals addressed: Depression/anxiety management; Medication compliance; Monitoring alcohol use  Progress Towards Goals: Progressing   Interventions: CBT, psychoeducation on budgeting/money management, problem solving    Summary: Danielle Reilly is a 48 year old single AA female that presented for therapy appointment today with diagnosis of PTSD; Major depressive disorder, recurrent, moderate with anxious distress; Alcohol Use Disorder, moderate; and Unresolved Grief.  Suicidal/Homicidal: None; without intent or plan.     Therapist Response:  Clinician met with Danielle Reilly for in-person therapy appointment and assessed for safety, sobriety, and medication compliance.  Danielle Reilly presented for session 9 minutes late, but was alert, oriented x5, with no evidence or self-report of active SI/HI or A/V H.  Danielle Reilly reported that she continues taking medication as prescribed and denied abuse of illicit substances.  Danielle Reilly reported that she had one binge drinking episode last week when she finished two 1/5s of liquor by herself.  She denied consequences related to this event, and denied blacking out, but acknowledged that she needs to cut back on alcohol consumption, stating "I know that was a lot.  Alcohol isn't going to make anything better".  Clinician inquired about Danielle Reilly's current emotional ratings, as well as any significant changes in thoughts, feelings, or behavior since last check-in.  Danielle Reilly reported scores of 7/10 for depression, 10/10 for anxiety, and 6/10 for anger.  Danielle Reilly denied any panic attacks or outbursts.  Danielle Reilly reported that a recent struggle that may be impacting drinking is finances, as she learned that her rent will be increasing.   Clinician provided Danielle Reilly with a handout today from the [facility] on how to establish a well balanced budget.  This handout's purpose was to help Danielle Reilly increase insight into exactly how much money she spends each month based upon various categories including housing, food, transportation, health, personal/family, finances, and misc.  A calculator was also featured in order to determine the difference between her total monthly income versus expenses, and Danielle Reilly was encouraged to identify which specific areas (i.e. fast food, bills, gas, etc) she could cut costs down in to reduce debt, and curb related anxiety as a result.  Intervention was effective, as evidenced by Danielle Reilly participating in discussion on this subject, and reporting that this was helpful for increasing awareness into areas where she could make changes to her budget.  Danielle Reilly reported that she is careful about unnecessary spending, but there is an imbalance in how much her son contributes, so she will plan to sit down and talk to him about paying his equal share of rent and bills, as he lives in the same household with her, and is an adult with a good job.  Danielle Reilly stated "He has had an easy ride most of his life, so I need to think about my future too.  This helped me wrap my mind around some new boundaries I need to set".  Clinician will continue to monitor.                     Plan: Follow up in 1 week.   Diagnosis: PTSD; Major depressive disorder, recurrent, moderate with anxious distress; Alcohol Use Disorder, moderate; and Unresolved Grief.  Collaboration of Care:   None required at this time.  Patient/Guardian was advised Release of Information must be obtained prior to any record release in order to collaborate their care with an outside provider. Patient/Guardian was advised if they have not already done so to contact the registration department to sign all necessary forms in order for [place] to release  information regarding their care.    Consent: Patient/Guardian gives verbal consent for treatment and assignment of benefits for services provided during this visit. Patient/Guardian expressed understanding and agreed to proceed.   Noralee Stain, LCSW, LCAS 06/12/23

## 2023-06-13 MED ORDER — CHLORDIAZEPOXIDE HCL 25 MG PO CAPS: ORAL_CAPSULE | ORAL | 0 refills | Status: DC

## 2023-06-13 MED ORDER — LORAZEPAM 1 MG PO TABS: 1.0000 mg | ORAL_TABLET | Freq: Once | ORAL | Status: DC

## 2023-06-13 MED ORDER — LORAZEPAM 2 MG/ML IJ SOLN
1.0000 mg | Freq: Once | INTRAMUSCULAR | Status: AC
  Administered 2023-06-13: 1 mg via INTRAVENOUS
  Filled 2023-06-13: qty 1

## 2023-06-13 MED ORDER — POTASSIUM CHLORIDE CRYS ER 20 MEQ PO TBCR
40.0000 meq | EXTENDED_RELEASE_TABLET | Freq: Once | ORAL | Status: AC
  Administered 2023-06-13: 40 meq via ORAL
  Filled 2023-06-13: qty 2

## 2023-06-13 MED ORDER — PANTOPRAZOLE SODIUM 40 MG PO TBEC: 40.0000 mg | DELAYED_RELEASE_TABLET | Freq: Every day | ORAL | 0 refills | Status: DC

## 2023-06-13 MED ORDER — CHLORDIAZEPOXIDE HCL 5 MG PO CAPS
10.0000 mg | ORAL_CAPSULE | Freq: Once | ORAL | Status: AC
Start: 2023-06-13 — End: 2023-06-13
  Administered 2023-06-13: 10 mg via ORAL
  Filled 2023-06-13: qty 2

## 2023-06-13 MED ORDER — ONDANSETRON HCL 4 MG/2ML IJ SOLN
4.0000 mg | Freq: Once | INTRAMUSCULAR | Status: AC
  Administered 2023-06-13: 4 mg via INTRAVENOUS
  Filled 2023-06-13: qty 2

## 2023-06-13 MED ORDER — ONDANSETRON 8 MG PO TBDP: 8.0000 mg | ORAL_TABLET | Freq: Three times a day (TID) | ORAL | 0 refills | Status: DC | PRN

## 2023-06-13 MED ORDER — ONDANSETRON 4 MG PO TBDP: 4.0000 mg | ORAL_TABLET | Freq: Once | ORAL | Status: DC

## 2023-06-13 MED ORDER — SODIUM CHLORIDE 0.9 % IV BOLUS
1000.0000 mL | Freq: Once | INTRAVENOUS | Status: AC
  Administered 2023-06-13: 1000 mL via INTRAVENOUS

## 2023-06-13 MED ORDER — PANTOPRAZOLE SODIUM 40 MG IV SOLR
40.0000 mg | Freq: Once | INTRAVENOUS | Status: AC
Start: 2023-06-13 — End: 2023-06-13
  Administered 2023-06-13: 40 mg via INTRAVENOUS
  Filled 2023-06-13: qty 10

## 2023-06-13 MED ORDER — KETOROLAC TROMETHAMINE 15 MG/ML IJ SOLN
15.0000 mg | Freq: Once | INTRAMUSCULAR | Status: AC
  Administered 2023-06-13: 15 mg via INTRAVENOUS
  Filled 2023-06-13: qty 1

## 2023-06-13 NOTE — ED Provider Notes (Signed)
 [place] EMERGENCY DEPARTMENT AT [facility] Provider Note   CSN: [id number] Arrival date & time: 06/12/23  1827     History  Chief Complaint  Patient presents with   Abdominal Pain   Alcohol Problem    Danielle Reilly is a 48 y.o. female.   Abdominal Pain Alcohol Problem Associated symptoms include abdominal pain.     Patient has a history of acid reflux pancreatitis, alcohol abuse.  Patient presented to the emergency room with complaints of abdominal pain.  Patient states she had been sober but started drinking alcohol this past weekend.  Patient last drank something yesterday morning.  She had an episode of nausea vomiting and started developing pain in her upper abdomen that radiated to her back.  Patient has history of pancreatitis and this felt similar.  She denies any dysuria.  No diarrhea.  Patient unfortunately has been waiting in the emergency room since yesterday for evaluation  Home Medications Prior to Admission medications   Medication Sig Start Date End Date Taking? Authorizing Provider  chlordiazePOXIDE (LIBRIUM) 25 MG capsule 50mg  PO TID x 1D, then 25-50mg  PO BID X 1D, then 25-50mg  PO QD X 1D 06/13/23  Yes Linwood Dibbles, MD  ondansetron (ZOFRAN-ODT) 8 MG disintegrating tablet Take 1 tablet (8 mg total) by mouth every 8 (eight) hours as needed for nausea or vomiting. 06/13/23  Yes Linwood Dibbles, MD  pantoprazole (PROTONIX) 40 MG tablet Take 1 tablet (40 mg total) by mouth daily. 06/13/23  Yes Linwood Dibbles, MD  FLUoxetine (PROZAC) 10 MG capsule Take 1 capsule (10 mg total) by mouth daily. 04/26/23   Arfeen, Phillips Grout, MD  fluticasone (FLONASE) 50 MCG/ACT nasal spray Place 1 spray into both nostrils daily as needed for allergies or rhinitis.    [provider]  Multiple Vitamin (MULTIVITAMINS PO) Take 1 tablet by mouth daily.    [provider]  naltrexone (DEPADE) 50 MG tablet Take 1 tablet (50 mg total) by mouth daily. 04/26/23   Arfeen, Phillips Grout, MD   omeprazole (PRILOSEC) 40 MG capsule Take 1 capsule (40 mg total) by mouth in the morning and at bedtime. Patient taking differently: Take 40 mg by mouth as needed. 07/24/22   Georgina Quint, MD  famotidine (PEPCID) 20 MG tablet Take 1 tablet (20 mg total) by mouth daily. Patient not taking: Reported on 10/28/2019 05/19/19 04/06/20  Jacalyn Lefevre, MD      Allergies    Fluconazole and Hydrocodone    Review of Systems   Review of Systems  Gastrointestinal:  Positive for abdominal pain.    Physical Exam Updated Vital Signs BP 110/69   Pulse (!) 114   Temp 98.6 F (37 C)   Resp 17   Ht 1.676 m (5\' 6" )   Wt 77.1 kg   LMP 06/19/2013   SpO2 100%   BMI 27.44 kg/m  Physical Exam Vitals and nursing note reviewed.  Constitutional:      Appearance: She is well-developed. She is not diaphoretic.  HENT:     Head: Normocephalic and atraumatic.     Right Ear: External ear normal.     Left Ear: External ear normal.  Eyes:     General: No scleral icterus.       Right eye: No discharge.        Left eye: No discharge.     Conjunctiva/sclera: Conjunctivae normal.  Neck:     Trachea: No tracheal deviation.  Cardiovascular:  Rate and Rhythm: Normal rate and regular rhythm.  Pulmonary:     Effort: Pulmonary effort is normal. No respiratory distress.     Breath sounds: Normal breath sounds. No stridor. No wheezing or rales.  Abdominal:     General: Bowel sounds are normal. There is no distension.     Palpations: Abdomen is soft.     Tenderness: There is abdominal tenderness in the epigastric area. There is no guarding or rebound.  Musculoskeletal:        General: No tenderness or deformity.     Cervical back: Neck supple.  Skin:    General: Skin is warm and dry.     Findings: No rash.  Neurological:     General: No focal deficit present.     Mental Status: She is alert.     Cranial Nerves: No cranial nerve deficit, dysarthria or facial asymmetry.     Sensory: No sensory  deficit.     Motor: No abnormal muscle tone or seizure activity.     Coordination: Coordination normal.  Psychiatric:        Mood and Affect: Mood normal.     ED Results / Procedures / Treatments   Labs (all labs ordered are listed, but only abnormal results are displayed) Labs Reviewed  ETHANOL - Abnormal; Notable for the following components:      Result Value   Alcohol, Ethyl (B) 92 (*)    All other components within normal limits  URINALYSIS, ROUTINE W REFLEX MICROSCOPIC - Abnormal; Notable for the following components:   Color, Urine COLORLESS (*)    Specific Gravity, Urine 1.002 (*)    Hgb urine dipstick SMALL (*)    Leukocytes,Ua MODERATE (*)    Bacteria, UA RARE (*)    All other components within normal limits  COMPREHENSIVE METABOLIC PANEL - Abnormal; Notable for the following components:   Potassium 3.0 (*)    CO2 20 (*)    Glucose, Bld 124 (*)    BUN <5 (*)    AST 45 (*)    Anion gap 18 (*)    All other components within normal limits  CBC  LIPASE, BLOOD    EKG None  Radiology CT ABDOMEN PELVIS W CONTRAST Result Date: 06/12/2023 CLINICAL DATA:  Acute severe pancreatitis, abdominal pain EXAM: CT ABDOMEN AND PELVIS WITH CONTRAST TECHNIQUE: Multidetector CT imaging of the abdomen and pelvis was performed using the standard protocol following bolus administration of intravenous contrast. RADIATION DOSE REDUCTION: This exam was performed according to the departmental dose-optimization program which includes automated exposure control, adjustment of the mA and/or kV according to patient size and/or use of iterative reconstruction technique. CONTRAST:  75mL OMNIPAQUE IOHEXOL 350 MG/ML SOLN COMPARISON:  07/14/2022 FINDINGS: Lower chest: No acute pleural or parenchymal lung disease. Hepatobiliary: Diffuse hepatic steatosis. Gallbladder is unremarkable. No biliary duct dilation. Pancreas: Unremarkable. No pancreatic ductal dilatation or surrounding inflammatory changes.  Spleen: Normal in size without focal abnormality. Adrenals/Urinary Tract: Adrenal glands are unremarkable. Kidneys are normal, without renal calculi, focal lesion, or hydronephrosis. Bladder is decompressed, limiting its evaluation. Stomach/Bowel: No bowel obstruction or ileus. Normal appendix right lower quadrant. No bowel wall thickening or inflammatory change. Vascular/Lymphatic: Incidental retroaortic left renal vein, a frequent anatomic variant. No other significant vascular findings. No pathologic adenopathy within the abdomen or pelvis. Indeterminate hypodense 8 mm short axis left para-aortic lymph node, new since prior study. Reproductive: Status post hysterectomy. No adnexal masses. Other: No free fluid or free intraperitoneal gas. No  abdominal wall hernia. Musculoskeletal: No acute or destructive bony abnormalities. Reconstructed images demonstrate no additional findings. IMPRESSION: 1. Hepatic steatosis. 2. No CT evidence of acute pancreatitis. No abnormalities to explain the patient's complaint of abdominal pain. Electronically Signed   By: Sharlet Salina M.D.   On: 06/12/2023 21:56    Procedures Procedures    Medications Ordered in ED Medications  folic acid (FOLVITE) tablet 1 mg (1 mg Oral Given 06/13/23 1035)  thiamine (VITAMIN B1) tablet 100 mg (100 mg Oral Given 06/13/23 1035)  multivitamin with minerals tablet 1 tablet (1 tablet Oral Given 06/12/23 1920)  LORazepam (ATIVAN) tablet 1 mg (1 mg Oral Given 06/12/23 1920)  iohexol (OMNIPAQUE) 350 MG/ML injection 75 mL (75 mLs Intravenous Contrast Given 06/12/23 2143)  LORazepam (ATIVAN) injection 1 mg (1 mg Intravenous Given 06/13/23 0614)  ondansetron (ZOFRAN) injection 4 mg (4 mg Intravenous Given 06/13/23 0613)  chlordiazePOXIDE (LIBRIUM) capsule 10 mg (10 mg Oral Given 06/13/23 1035)  pantoprazole (PROTONIX) injection 40 mg (40 mg Intravenous Given 06/13/23 1034)  potassium chloride SA (KLOR-CON M) CR tablet 40 mEq (40 mEq Oral Given  06/13/23 1035)  sodium chloride 0.9 % bolus 1,000 mL (1,000 mLs Intravenous New Bag/Given 06/13/23 1036)    ED Course/ Medical Decision Making/ A&P Clinical Course as of 06/13/23 1318  Wed Jun 13, 2023  0941 Patient's laboratory test do not suggest hepatitis or pancreatitis.  Urinalysis does not suggest infection.  Patient's CT scan does not show any evidence of pancreatitis or other acute abnormality.  Fatty liver noted. [JK]    Clinical Course User Index [JK] Linwood Dibbles, MD                                 Medical Decision Making Differential diagnosis includes but not limited to alcohol withdrawal, pancreatitis, alcoholic gastritis, hepatitis  Problems Addressed: Alcohol abuse: acute illness or injury that poses a threat to life or bodily functions Epigastric pain: acute illness or injury that poses a threat to life or bodily functions  Amount and/or Complexity of Data Reviewed Labs: ordered. Decision-making details documented in ED Course. Radiology: ordered and independent interpretation performed.  Risk Prescription drug management.   Patient presented to the ED for evaluation of abdominal pain in the setting of recent alcohol use.  Patient had been sober for several months.  Patient was concerned she was developing recurrent pancreatitis.  Patient's labs do not suggest pancreatitis.  CT scan does not also show pancreatitis.  Patient was concerned about some abdominal swelling but there is no evidence of ascites mass or other acute abnormality on the CT scan.  No evidence of hepatitis.  Patient is not anemic.  Will treat patient with a liter of IV fluids.  Antacids.  Plan on discharge home with medications for gastritis and nausea.  Also provide a short course of Librium to help with any potential withdrawal symptoms        Final Clinical Impression(s) / ED Diagnoses Final diagnoses:  Epigastric pain  Alcohol abuse  Hypokalemia    Rx / DC Orders ED Discharge Orders           Ordered    pantoprazole (PROTONIX) 40 MG tablet  Daily        06/13/23 0943    ondansetron (ZOFRAN-ODT) 8 MG disintegrating tablet  Every 8 hours PRN        06/13/23 0943    chlordiazePOXIDE (LIBRIUM) 25  MG capsule        06/13/23 1610              Linwood Dibbles, MD 06/13/23 1318

## 2023-06-13 NOTE — ED Notes (Signed)
 Pt verbalized understanding of discharge instructions. Pharmacy verified. Pt ambulated from ed

## 2023-06-13 NOTE — Discharge Instructions (Addendum)
 Take the medications as prescribed to help with your abdominal pain.  The CT scan and laboratory tests are reassuring.  Follow-up with your primary care doctor and counselor to be rechecked.

## 2023-06-26 ENCOUNTER — Ambulatory Visit (INDEPENDENT_AMBULATORY_CARE_PROVIDER_SITE_OTHER): Payer: MEDICAID | Admitting: Licensed Clinical Social Worker

## 2023-06-26 DIAGNOSIS — F4321 Adjustment disorder with depressed mood: Secondary | ICD-10-CM

## 2023-06-26 DIAGNOSIS — F331 Major depressive disorder, recurrent, moderate: Secondary | ICD-10-CM

## 2023-06-26 DIAGNOSIS — F1020 Alcohol dependence, uncomplicated: Secondary | ICD-10-CM

## 2023-06-26 DIAGNOSIS — F4312 Post-traumatic stress disorder, chronic: Secondary | ICD-10-CM
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
## 2023-06-28 ENCOUNTER — Ambulatory Visit (HOSPITAL_BASED_OUTPATIENT_CLINIC_OR_DEPARTMENT_OTHER): Payer: MEDICAID | Admitting: Psychiatry

## 2023-06-28 ENCOUNTER — Encounter (HOSPITAL_COMMUNITY): Payer: Self-pay | Admitting: Psychiatry

## 2023-06-28 VITALS — BP 117/74 | HR 73 | Ht 66.0 in | Wt 188.0 lb

## 2023-06-28 DIAGNOSIS — T1490XA Injury, unspecified, initial encounter: Secondary | ICD-10-CM

## 2023-06-28 DIAGNOSIS — F322 Major depressive disorder, single episode, severe without psychotic features: Secondary | ICD-10-CM | POA: Diagnosis not present

## 2023-06-28 DIAGNOSIS — F10188 Alcohol abuse with other alcohol-induced disorder: Secondary | ICD-10-CM | POA: Diagnosis not present

## 2023-06-28 DIAGNOSIS — F4312 Post-traumatic stress disorder, chronic: Secondary | ICD-10-CM

## 2023-06-28 MED ORDER — NALTREXONE HCL 50 MG PO TABS: 50.0000 mg | ORAL_TABLET | Freq: Every day | ORAL | 1 refills | Status: DC

## 2023-06-28 MED ORDER — FLUOXETINE HCL 10 MG PO CAPS: 10.0000 mg | ORAL_CAPSULE | Freq: Every day | ORAL | 1 refills | Status: DC
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
## 2023-07-10 ENCOUNTER — Ambulatory Visit (INDEPENDENT_AMBULATORY_CARE_PROVIDER_SITE_OTHER): Payer: MEDICAID | Admitting: Licensed Clinical Social Worker

## 2023-07-10 DIAGNOSIS — F4321 Adjustment disorder with depressed mood: Secondary | ICD-10-CM

## 2023-07-10 DIAGNOSIS — F331 Major depressive disorder, recurrent, moderate: Secondary | ICD-10-CM

## 2023-07-10 DIAGNOSIS — F4312 Post-traumatic stress disorder, chronic: Secondary | ICD-10-CM

## 2023-07-10 DIAGNOSIS — F1020 Alcohol dependence, uncomplicated: Secondary | ICD-10-CM
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
## 2023-07-12 ENCOUNTER — Emergency Department (HOSPITAL_COMMUNITY): Payer: MEDICAID

## 2023-07-12 ENCOUNTER — Emergency Department (HOSPITAL_COMMUNITY)
Admission: EM | Admit: 2023-07-12 | Discharge: 2023-07-13 | Disposition: A | Discharge status: Home / Self Care | Payer: MEDICAID | Attending: Emergency Medicine | Admitting: Emergency Medicine

## 2023-07-12 ENCOUNTER — Other Ambulatory Visit: Payer: Self-pay

## 2023-07-12 DIAGNOSIS — R079 Chest pain, unspecified: Secondary | ICD-10-CM | POA: Diagnosis present

## 2023-07-12 DIAGNOSIS — R0789 Other chest pain: Secondary | ICD-10-CM | POA: Diagnosis not present

## 2023-07-12 DIAGNOSIS — K30 Functional dyspepsia: Secondary | ICD-10-CM | POA: Insufficient documentation

## 2023-07-12 LAB — CBC
HCT: 39.3 % (ref 36.0–46.0)
Hemoglobin: 12.5 g/dL (ref 12.0–15.0)
MCH: 28 pg (ref 26.0–34.0)
MCHC: 31.8 g/dL (ref 30.0–36.0)
MCV: 87.9 fL (ref 80.0–100.0)
Platelets: 271 10*3/uL (ref 150–400)
RBC: 4.47 MIL/uL (ref 3.87–5.11)
RDW: 13.2 % (ref 11.5–15.5)
WBC: 5.4 10*3/uL (ref 4.0–10.5)
nRBC: 0 % (ref 0.0–0.2)

## 2023-07-12 LAB — TROPONIN I (HIGH SENSITIVITY): Troponin I (High Sensitivity): <2 ng/L (ref <18)

## 2023-07-12 LAB — BASIC METABOLIC PANEL WITH GFR
Anion gap: 10 (ref 5–15)
BUN: 6 mg/dL (ref 6–20)
CO2: 25 mmol/L (ref 22–32)
Calcium: 10.4 mg/dL — ABNORMAL HIGH (ref 8.9–10.3)
Chloride: 101 mmol/L (ref 98–111)
Creatinine, Ser: 0.83 mg/dL (ref 0.44–1.00)
GFR, Estimated: >60 mL/min (ref >60)
Glucose, Bld: 95 mg/dL (ref 70–99)
Potassium: 3.5 mmol/L (ref 3.5–5.1)
Sodium: 136 mmol/L (ref 135–145)

## 2023-07-12 NOTE — ED Provider Triage Note (Signed)
 Emergency Medicine Provider Triage Evaluation Note  Danielle Reilly , a 48 y.o. female  was evaluated in triage.  Pt complains of chest pain bilaterally, worse on left for 4-5 hours. Reports feels tight. Not exertional. No hx of acs. No nausea, vomiting. Does have hx of gastritis but doesn't feel simialr per patient.  Review of Systems  Positive: Chest pain Negative:   Physical Exam  BP (!) 148/85 (BP Location: Right Arm)   Pulse 65   Temp 97.6 F (36.4 C)   Resp 18   Ht 5\' 6"  (1.676 m)   Wt 77.1 kg   LMP 06/19/2013   SpO2 100%   BMI 27.44 kg/m  Gen:   Awake, no distress   Resp:  Normal effort  MSK:   Moves extremities without difficulty  Other:    Medical Decision Making  Medically screening exam initiated at 9:27 PM.  Appropriate orders placed.  Danielle Reilly was informed that the remainder of the evaluation will be completed by another provider, this initial triage assessment does not replace that evaluation, and the importance of remaining in the ED until their evaluation is complete.  Workup initiated in triage    [place] 07/12/23 2128

## 2023-07-12 NOTE — ED Triage Notes (Signed)
 Pt report bilateral chest pain for the past 4 hours. Pain worsening on the left side, described as tightening. Denies any known cardiac hx

## 2023-07-13 ENCOUNTER — Encounter (HOSPITAL_COMMUNITY): Payer: Self-pay

## 2023-07-13 LAB — TROPONIN I (HIGH SENSITIVITY): Troponin I (High Sensitivity): <2 ng/L (ref <18)

## 2023-07-13 MED ORDER — ALUM & MAG HYDROXIDE-SIMETH 400-400-40 MG/5ML PO SUSP: 15.0000 mL | Freq: Four times a day (QID) | ORAL | 0 refills | Status: DC | PRN

## 2023-07-13 MED ORDER — PANTOPRAZOLE SODIUM 40 MG PO TBEC: 40.0000 mg | DELAYED_RELEASE_TABLET | Freq: Every day | ORAL | Status: DC

## 2023-07-13 MED ORDER — METHOCARBAMOL 500 MG PO TABS
1000.0000 mg | ORAL_TABLET | Freq: Once | ORAL | Status: AC
  Administered 2023-07-13: 1000 mg via ORAL
  Filled 2023-07-13: qty 2

## 2023-07-13 MED ORDER — KETOROLAC TROMETHAMINE 60 MG/2ML IM SOLN
30.0000 mg | Freq: Once | INTRAMUSCULAR | Status: AC
  Administered 2023-07-13: 30 mg via INTRAMUSCULAR
  Filled 2023-07-13: qty 2

## 2023-07-13 MED ORDER — PANTOPRAZOLE SODIUM 40 MG PO TBEC: 40.0000 mg | DELAYED_RELEASE_TABLET | Freq: Every day | ORAL | 0 refills | Status: DC

## 2023-07-13 MED ORDER — ALUM & MAG HYDROXIDE-SIMETH 200-200-20 MG/5ML PO SUSP
30.0000 mL | Freq: Once | ORAL | Status: AC
  Administered 2023-07-13: 30 mL via ORAL
  Filled 2023-07-13: qty 30

## 2023-07-13 MED ORDER — METHOCARBAMOL 500 MG PO TABS: 1000.0000 mg | ORAL_TABLET | Freq: Two times a day (BID) | ORAL | 0 refills | Status: DC | PRN

## 2023-07-13 MED ORDER — LIDOCAINE VISCOUS HCL 2 % MT SOLN
15.0000 mL | Freq: Once | OROMUCOSAL | Status: AC
  Administered 2023-07-13: 15 mL via ORAL
  Filled 2023-07-13: qty 15

## 2023-07-13 MED ORDER — FAMOTIDINE 20 MG PO TABS
20.0000 mg | ORAL_TABLET | Freq: Once | ORAL | Status: AC
  Administered 2023-07-13: 20 mg via ORAL
  Filled 2023-07-13: qty 1

## 2023-07-13 NOTE — ED Provider Notes (Signed)
 [facility] AT [facility] Provider Note   CSN: [id number] Arrival date & time: 07/12/23  2115     History  Chief Complaint  Patient presents with   Chest Pain    Danielle Reilly is a 48 y.o. female.  48 year old female history of indigestion presents ER today with chest pain.  Her main issue is that she has some chest pain just above her left breast sometimes radiates over towards the right.  Started earlier today.  No history of the same.  Feels sharp in nature.  No radiation towards her back towards her shoulders, no associated shortness of breath, nausea, lightheadedness, diaphoresis, lower extremity swelling, recent travels or surgeries, hormone therapy.  Has not tried thing for the symptoms.  No trauma.  Of note, also has indigestion. H/o same. Burning in epigastric area up to the mid chest separate from the left sided chest pain.    Chest Pain      Home Medications Prior to Admission medications   Medication Sig Start Date End Date Taking? Authorizing Provider  alum & mag hydroxide-simeth (MAALOX PLUS) 400-400-40 MG/5ML suspension Take 15 mLs by mouth every 6 (six) hours as needed for indigestion. 07/13/23  Yes Malachi Suderman, Barbara Cower, MD  methocarbamol (ROBAXIN) 500 MG tablet Take 2 tablets (1,000 mg total) by mouth 2 (two) times daily as needed for muscle spasms. 07/13/23  Yes Shanon Seawright, Barbara Cower, MD  pantoprazole (PROTONIX) 40 MG tablet Take 1 tablet (40 mg total) by mouth daily. 07/13/23  Yes Koehn Salehi, Barbara Cower, MD  chlordiazePOXIDE (LIBRIUM) 25 MG capsule 50mg  PO TID x 1D, then 25-50mg  PO BID X 1D, then 25-50mg  PO QD X 1D 06/13/23   Linwood Dibbles, MD  FLUoxetine (PROZAC) 10 MG capsule Take 1 capsule (10 mg total) by mouth daily. 06/28/23   Arfeen, Phillips Grout, MD  fluticasone (FLONASE) 50 MCG/ACT nasal spray Place 1 spray into both nostrils daily as needed for allergies or rhinitis.    [provider]  Multiple Vitamin (MULTIVITAMINS PO) Take 1 tablet by mouth  daily.    [provider]  naltrexone (DEPADE) 50 MG tablet Take 1 tablet (50 mg total) by mouth daily. 06/28/23   Arfeen, Phillips Grout, MD  ondansetron (ZOFRAN-ODT) 8 MG disintegrating tablet Take 1 tablet (8 mg total) by mouth every 8 (eight) hours as needed for nausea or vomiting. 06/13/23   Linwood Dibbles, MD  famotidine (PEPCID) 20 MG tablet Take 1 tablet (20 mg total) by mouth daily. Patient not taking: Reported on 10/28/2019 05/19/19 04/06/20  Jacalyn Lefevre, MD      Allergies    Fluconazole and Hydrocodone    Review of Systems   Review of Systems  Cardiovascular:  Positive for chest pain.    Physical Exam Updated Vital Signs BP 112/78   Pulse 62   Temp 98.3 F (36.8 C) (Oral)   Resp 19   Ht 5\' 6"  (1.676 m)   Wt 77.1 kg   LMP 06/19/2013   SpO2 98%   BMI 27.44 kg/m  Physical Exam Vitals and nursing note reviewed.  Constitutional:      Appearance: She is well-developed.  HENT:     Head: Normocephalic and atraumatic.  Cardiovascular:     Rate and Rhythm: Normal rate and regular rhythm.  Pulmonary:     Effort: No respiratory distress.     Breath sounds: No stridor.  Chest:     Chest wall: Tenderness (left mid upper chest) present.  Abdominal:  General: There is no distension.  Musculoskeletal:     Cervical back: Normal range of motion.     Right lower leg: No edema.     Left lower leg: No edema.  Neurological:     Mental Status: She is alert.     ED Results / Procedures / Treatments   Labs (all labs ordered are listed, but only abnormal results are displayed) Labs Reviewed  BASIC METABOLIC PANEL WITH GFR - Abnormal; Notable for the following components:      Result Value   Calcium 10.4 (*)    All other components within normal limits  CBC  TROPONIN I (HIGH SENSITIVITY)  TROPONIN I (HIGH SENSITIVITY)    EKG EKG Interpretation Date/Time:  Thursday July 12 2023 21:24:14 EDT Ventricular Rate:  52 PR Interval:  136 QRS Duration:  80 QT  Interval:  426 QTC Calculation: 396 R Axis:   61  Text Interpretation: Sinus bradycardia Otherwise normal ECG When compared with ECG of 24-Jan-2023 10:55, PREVIOUS ECG IS PRESENT Confirmed by Marily Memos [phone]) on 07/13/2023 2:27:09 AM  Radiology DG Chest 2 View Result Date: 07/12/2023 CLINICAL DATA:  Chest pain EXAM: CHEST - 2 VIEW COMPARISON:  10/28/2019 FINDINGS: The heart size and mediastinal contours are within normal limits. Both lungs are clear. The visualized skeletal structures are unremarkable. IMPRESSION: No active cardiopulmonary disease. Electronically Signed   By: Jasmine Pang M.D.   On: 07/12/2023 21:47    Procedures Procedures    Medications Ordered in ED Medications  pantoprazole (PROTONIX) EC tablet 40 mg (has no administration in time range)  methocarbamol (ROBAXIN) tablet 1,000 mg (1,000 mg Oral Given 07/13/23 0332)  ketorolac (TORADOL) injection 30 mg (30 mg Intramuscular Given 07/13/23 0333)  alum & mag hydroxide-simeth (MAALOX/MYLANTA) 200-200-20 MG/5ML suspension 30 mL (30 mLs Oral Given 07/13/23 0446)    And  lidocaine (XYLOCAINE) 2 % viscous mouth solution 15 mL (15 mLs Oral Given 07/13/23 0446)  famotidine (PEPCID) tablet 20 mg (20 mg Oral Given 07/13/23 0446)    ED Course/ Medical Decision Making/ A&P                                 Medical Decision Making Amount and/or Complexity of Data Reviewed Labs: ordered. Radiology: ordered.  Risk OTC drugs. Prescription drug management.   MSK/chest wall pain to left chest, reproducible, treated and improved. ECG/troponins reassuring, doubt ACS. Low risk for PE.  Indigestion treated and sent meds for same.   Final Clinical Impression(s) / ED Diagnoses Final diagnoses:  Nonspecific chest pain  Chest wall pain  Indigestion    Rx / DC Orders ED Discharge Orders          Ordered    methocarbamol (ROBAXIN) 500 MG tablet  2 times daily PRN        07/13/23 0541    pantoprazole (PROTONIX) 40 MG tablet   Daily        07/13/23 0541    alum & mag hydroxide-simeth (MAALOX PLUS) 400-400-40 MG/5ML suspension  Every 6 hours PRN        07/13/23 0541              Teauna Dubach, Barbara Cower, MD 07/13/23 [phone]

## 2023-07-24 ENCOUNTER — Ambulatory Visit (HOSPITAL_COMMUNITY): Payer: Medicaid Other | Admitting: Licensed Clinical Social Worker

## 2023-07-30 ENCOUNTER — Other Ambulatory Visit: Payer: Self-pay | Admitting: Emergency Medicine

## 2023-07-30 DIAGNOSIS — Z1231 Encounter for screening mammogram for malignant neoplasm of breast: Secondary | ICD-10-CM

## 2023-08-07 ENCOUNTER — Encounter: Payer: Self-pay | Admitting: Emergency Medicine

## 2023-08-07 ENCOUNTER — Ambulatory Visit (INDEPENDENT_AMBULATORY_CARE_PROVIDER_SITE_OTHER): Payer: MEDICAID | Admitting: Emergency Medicine

## 2023-08-07 ENCOUNTER — Ambulatory Visit
Admission: RE | Admit: 2023-08-07 | Discharge: 2023-08-07 | Disposition: A | Discharge status: Home / Self Care | Payer: MEDICAID | Source: Ambulatory Visit

## 2023-08-07 VITALS — BP 118/78 | HR 65 | Temp 98.3°F | Ht 66.0 in | Wt 180.0 lb

## 2023-08-07 DIAGNOSIS — Z13228 Encounter for screening for other metabolic disorders: Secondary | ICD-10-CM

## 2023-08-07 DIAGNOSIS — Z130 Encounter for screening for diseases of the blood and blood-forming organs and certain disorders involving the immune mechanism: Secondary | ICD-10-CM

## 2023-08-07 DIAGNOSIS — F1011 Alcohol abuse, in remission: Secondary | ICD-10-CM | POA: Diagnosis not present

## 2023-08-07 DIAGNOSIS — Z1329 Encounter for screening for other suspected endocrine disorder: Secondary | ICD-10-CM | POA: Diagnosis not present

## 2023-08-07 DIAGNOSIS — J310 Chronic rhinitis: Secondary | ICD-10-CM

## 2023-08-07 DIAGNOSIS — K219 Gastro-esophageal reflux disease without esophagitis: Secondary | ICD-10-CM | POA: Diagnosis not present

## 2023-08-07 DIAGNOSIS — Z13220 Encounter for screening for lipoid disorders: Secondary | ICD-10-CM | POA: Diagnosis not present

## 2023-08-07 DIAGNOSIS — F411 Generalized anxiety disorder: Secondary | ICD-10-CM | POA: Diagnosis not present

## 2023-08-07 DIAGNOSIS — Z0001 Encounter for general adult medical examination with abnormal findings: Secondary | ICD-10-CM

## 2023-08-07 DIAGNOSIS — Z1231 Encounter for screening mammogram for malignant neoplasm of breast: Secondary | ICD-10-CM

## 2023-08-07 LAB — CBC WITH DIFFERENTIAL/PLATELET
Basophils Absolute: 0.1 10*3/uL (ref 0.0–0.1)
Basophils Relative: 1.2 % (ref 0.0–3.0)
Eosinophils Absolute: 0 10*3/uL (ref 0.0–0.7)
Eosinophils Relative: 0.8 % (ref 0.0–5.0)
HCT: 38.8 % (ref 36.0–46.0)
Hemoglobin: 12.6 g/dL (ref 12.0–15.0)
Lymphocytes Relative: 38.9 % (ref 12.0–46.0)
Lymphs Abs: 1.8 10*3/uL (ref 0.7–4.0)
MCHC: 32.5 g/dL (ref 30.0–36.0)
MCV: 86.6 fl (ref 78.0–100.0)
Monocytes Absolute: 0.4 10*3/uL (ref 0.1–1.0)
Monocytes Relative: 8.5 % (ref 3.0–12.0)
Neutro Abs: 2.4 10*3/uL (ref 1.4–7.7)
Neutrophils Relative %: 50.6 % (ref 43.0–77.0)
Platelets: 276 10*3/uL (ref 150.0–400.0)
RBC: 4.48 Mil/uL (ref 3.87–5.11)
RDW: 13.1 % (ref 11.5–15.5)
WBC: 4.7 10*3/uL (ref 4.0–10.5)

## 2023-08-07 LAB — HEMOGLOBIN A1C: Hgb A1c MFr Bld: 5.9 % (ref 4.6–6.5)

## 2023-08-07 LAB — COMPREHENSIVE METABOLIC PANEL WITH GFR
ALT: 9 U/L (ref 0–35)
AST: 15 U/L (ref 0–37)
Albumin: 4.4 g/dL (ref 3.5–5.2)
Alkaline Phosphatase: 46 U/L (ref 39–117)
BUN: 12 mg/dL (ref 6–23)
CO2: 29 meq/L (ref 19–32)
Calcium: 10.2 mg/dL (ref 8.4–10.5)
Chloride: 102 meq/L (ref 96–112)
Creatinine, Ser: 0.9 mg/dL (ref 0.40–1.20)
GFR: 76.07 mL/min (ref >60.00)
Glucose, Bld: 89 mg/dL (ref 70–99)
Potassium: 4.1 meq/L (ref 3.5–5.1)
Sodium: 138 meq/L (ref 135–145)
Total Bilirubin: 0.5 mg/dL (ref 0.2–1.2)
Total Protein: 7.5 g/dL (ref 6.0–8.3)

## 2023-08-07 LAB — LIPID PANEL
Cholesterol: 190 mg/dL (ref 0–200)
HDL: 78.5 mg/dL (ref >39.00)
LDL Cholesterol: 91 mg/dL (ref 0–99)
NonHDL: 111.45
Total CHOL/HDL Ratio: 2
Triglycerides: 100 mg/dL (ref 0.0–149.0)
VLDL: 20 mg/dL (ref 0.0–40.0)

## 2023-08-07 MED ORDER — AZELASTINE HCL 0.1 % NA SOLN: 2.0000 | Freq: Two times a day (BID) | NASAL | 3 refills | Status: DC

## 2023-08-07 NOTE — Assessment & Plan Note (Signed)
Stable.  Sees psychiatrist on a regular basis. 

## 2023-08-07 NOTE — Assessment & Plan Note (Signed)
 Well-controlled. Takes pantoprazole  40 mg as needed

## 2023-08-07 NOTE — Assessment & Plan Note (Signed)
 Active and affecting quality of life Recommend to start azelastine  nasal spray as needed

## 2023-08-07 NOTE — Progress Notes (Signed)
 [facility] 48 y.o.   Chief Complaint  Patient presents with   Allergies    Patient here for allergies, and her also says she's been having chest pain she did go to the ED and they told her calcium is high and sent her home with muscle relaxer. She wants a better understanding of her ED visit she mentions she wasn't explained much.     HISTORY OF PRESENT ILLNESS: This is a 48 y.o. female here for annual exam Recently emergency department on 07/12/2023 complaining of chest pain.  Negative workup. Also history of chronic nasal allergies.  Requesting prescription for azelastine . Was found to have calcium of 10.4 in the emergency department. No other complaints or medical concerns today. Was able to stop drinking with success.  HPI   Prior to Admission medications   Medication Sig Start Date End Date Taking? Authorizing Provider  FLUoxetine  (PROZAC ) 10 MG capsule Take 1 capsule (10 mg total) by mouth daily. 06/28/23  Yes Arfeen, Syed T, MD  fluticasone (FLONASE) 50 MCG/ACT nasal spray Place 1 spray into both nostrils daily as needed for allergies or rhinitis.   Yes [provider]  Multiple Vitamin (MULTIVITAMINS PO) Take 1 tablet by mouth daily.   Yes [provider]  naltrexone  (DEPADE) 50 MG tablet Take 1 tablet (50 mg total) by mouth daily. 06/28/23  Yes Arfeen, Bronson Canny, MD  pantoprazole  (PROTONIX ) 40 MG tablet Take 1 tablet (40 mg total) by mouth daily. 07/13/23  Yes Mesner, Reymundo Caulk, MD  azelastine  (ASTELIN ) 0.1 % nasal spray Place 2 sprays into both nostrils 2 (two) times daily. Use in each nostril as directed 08/07/23  Yes Rayola Everhart, Isidro Margo, MD  famotidine  (PEPCID ) 20 MG tablet Take 1 tablet (20 mg total) by mouth daily. Patient not taking: Reported on 10/28/2019 05/19/19 04/06/20  Sueellen Emery, MD    Allergies  Allergen Reactions   Fluconazole  Hives   Hydrocodone  Nausea And Vomiting    Patient Active Problem List   Diagnosis Date Noted   Alcohol  intoxication in relapsed alcoholic (HCC) 01/24/2023   Alcohol abuse with other alcohol-induced disorder (HCC) 01/23/2023   Chronic post-traumatic stress disorder (PTSD) 01/23/2023   Major depressive disorder, single episode, severe without psychotic features (HCC) 01/23/2023   Generalized anxiety disorder 12/06/2020   History of gastritis 12/06/2020   History of alcohol abuse 12/06/2020   GERD (gastroesophageal reflux disease) 06/17/2019    Past Medical History:  Diagnosis Date   Acute pancreatitis    Allergy    SEASONAL   Anemia    Anxiety    "A LITTLE"   Depression    Gastritis    GERD (gastroesophageal reflux disease)    tums as needed   Hemoperitoneum 12/25/2010   Nausea and vomiting 06/17/2019   Substance abuse (HCC)    ETOH ABUSE   SVD (spontaneous vaginal delivery)    x 1   Toothache 01/29/2013    Past Surgical History:  Procedure Laterality Date   BILATERAL SALPINGECTOMY N/A 06/19/2013   Procedure: BILATERAL SALPINGECTOMY;  Surgeon: Lenord Radon, MD;  Location: [facility];  Service: Gynecology;  Laterality: N/A;   DILITATION & CURRETTAGE/HYSTROSCOPY WITH NOVASURE ABLATION N/A 04/04/2013   Procedure: DILATATION & CURETTAGE/HYSTEROSCOPY WITH Attempted NOVASURE ABLATION ;  Surgeon: Lenord Radon, MD;  Location: [facility];  Service: Gynecology;  Laterality: N/A;   HYSTEROSCOPY N/A 05/26/2013   Procedure: UNSUCCESSFUL HYSTEROSCOPY WITH HYDROTHERMAL ABLATION;  Surgeon: Lenord Radon, MD;  Location: [facility];  Service: Gynecology;  Laterality:  N/A;   LAPAROSCOPY  12/25/2010   Procedure: LAPAROSCOPY OPERATIVE;  Surgeon: Truitt Gaba, MD;  Location: [facility];  Service: Gynecology;  Laterality: N/A;  Operative laparoscopy /cauterization of right hemorrhagic cyst wall. Removal of belly rings.   LAPAROSCOPY FOR ECTOPIC PREGNANCY     NOVASURE ABLATION N/A 05/26/2013   Procedure: UNSUCCESSFUL NOVASURE ABLATION;  Surgeon: Lenord Radon, MD;  Location: [facility];  Service: Gynecology;  Laterality: N/A;   uterine biopsy     VAGINAL HYSTERECTOMY N/A 06/19/2013   Procedure:  TOTAL VAGINAL HYSTERECTOMY with bilateral salpingectomy;  Surgeon: Lenord Radon, MD;  Location: [facility];  Service: Gynecology;  Laterality: N/A;    Social History   Socioeconomic History   Marital status: Divorced    Spouse name: Not on file   Number of children: Not on file   Years of education: Not on file   Highest education level: Some college, no degree  Occupational History   Not on file  Tobacco Use   Smoking status: Never   Smokeless tobacco: Never  Vaping Use   Vaping status: Never Used  Substance and Sexual Activity   Alcohol use: Not Currently    Comment: STOPPED 6 MONTHS AGO   Drug use: Not Currently   Sexual activity: Yes    Partners: Male    Birth control/protection: Surgical, Condom  Other Topics Concern   Not on file  Social History Narrative   Pt lives in [place] with her son.  She is unemployed.  She is not followed by a psychiatrist.   Social Drivers of Health   Financial Resource Strain: Medium Risk (08/06/2023)   Overall Financial Resource Strain (CARDIA)    Difficulty of Paying Living Expenses: Somewhat hard  Food Insecurity: Food Insecurity Present (08/06/2023)   Hunger Vital Sign    Worried About Running Out of Food in the Last Year: Often true    Ran Out of Food in the Last Year: Sometimes true  Transportation Needs: No Transportation Needs (08/06/2023)   PRAPARE - Administrator, Civil Service (Medical): No    Lack of Transportation (Non-Medical): No  Physical Activity: Insufficiently Active (08/06/2023)   Exercise Vital Sign    Days of Exercise per Week: 2 days    Minutes of Exercise per Session: 50 min  Stress: Stress Concern Present (08/06/2023)   [employer] of Occupational Health - Occupational Stress Questionnaire    Feeling of Stress : To some extent  Social Connections: Moderately Isolated  (08/06/2023)   Social Connection and Isolation Panel [NHANES]    Frequency of Communication with Friends and Family: More than three times a week    Frequency of Social Gatherings with Friends and Family: Once a week    Attends Religious Services: 1 to 4 times per year    Active Member of [employer] or Organizations: No    Attends Engineer, structural: Not on file    Marital Status: Divorced  Intimate Partner Violence: Unknown (01/24/2023)   Humiliation, Afraid, Rape, and Kick questionnaire    Fear of Current or Ex-Partner: No    Emotionally Abused: No    Physically Abused: No    Sexually Abused: Not on file    Family History  Problem Relation Age of Onset   Drug abuse Mother    Arthritis Maternal Aunt    Cancer Maternal Aunt    Drug abuse Maternal Aunt    Arthritis Maternal Uncle    Alcohol abuse Maternal Uncle  Cancer Maternal Uncle    Drug abuse Maternal Uncle    Cancer Maternal Grandmother    Alcohol abuse Maternal Grandfather    Cancer Maternal Grandfather    Colon cancer Neg Hx    Colon polyps Neg Hx    Crohn's disease Neg Hx    Esophageal cancer Neg Hx    Rectal cancer Neg Hx    Stomach cancer Neg Hx    Ulcerative colitis Neg Hx      Review of Systems  Constitutional: Negative.  Negative for chills and fever.  HENT:  Positive for congestion.   Respiratory: Negative.  Negative for cough and shortness of breath.   Cardiovascular: Negative.  Negative for chest pain and palpitations.  Gastrointestinal:  Negative for abdominal pain, diarrhea, nausea and vomiting.  Genitourinary: Negative.  Negative for dysuria and urgency.  Skin: Negative.  Negative for rash.  Neurological:  Negative for dizziness and headaches.  Endo/Heme/Allergies:  Positive for environmental allergies.  All other systems reviewed and are negative.   Vitals:   08/07/23 1029  BP: 118/78  Pulse: 65  Temp: 98.3 F (36.8 C)  SpO2: 98%    Physical Exam Vitals reviewed.   Constitutional:      Appearance: Normal appearance.  HENT:     Head: Normocephalic.     Right Ear: Tympanic membrane, ear canal and external ear normal.     Left Ear: Tympanic membrane, ear canal and external ear normal.     Mouth/Throat:     Mouth: Mucous membranes are moist.     Pharynx: Oropharynx is clear.  Eyes:     Extraocular Movements: Extraocular movements intact.     Conjunctiva/sclera: Conjunctivae normal.     Pupils: Pupils are equal, round, and reactive to light.  Cardiovascular:     Rate and Rhythm: Normal rate and regular rhythm.     Pulses: Normal pulses.     Heart sounds: Normal heart sounds.  Pulmonary:     Effort: Pulmonary effort is normal.     Breath sounds: Normal breath sounds.  Abdominal:     Palpations: Abdomen is soft.     Tenderness: There is no abdominal tenderness.  Musculoskeletal:     Cervical back: No tenderness.     Right lower leg: No edema.     Left lower leg: No edema.  Lymphadenopathy:     Cervical: No cervical adenopathy.  Skin:    General: Skin is warm and dry.     Capillary Refill: Capillary refill takes less than 2 seconds.  Neurological:     General: No focal deficit present.     Mental Status: She is alert and oriented to person, place, and time.  Psychiatric:        Mood and Affect: Mood normal.        Behavior: Behavior normal.      ASSESSMENT & PLAN: Problem List Items Addressed This Visit       Respiratory   Chronic rhinitis   Active and affecting quality of life Recommend to start azelastine  nasal spray as needed        Digestive   GERD (gastroesophageal reflux disease)   Well-controlled. Takes pantoprazole  40 mg as needed        Other   History of alcohol abuse (Chronic)   Making progress.  Quit drinking 2 months ago with success but really started about 6 months ago.      Generalized anxiety disorder   Stable.  Sees psychiatrist on a  regular basis      Other Visit Diagnoses       Encounter for  general adult medical examination with abnormal findings    -  Primary   Relevant Orders   CBC with Differential/Platelet   Comprehensive metabolic panel with GFR   Hemoglobin A1c   Lipid panel     Screening for deficiency anemia       Relevant Orders   CBC with Differential/Platelet     Screening for lipoid disorders       Relevant Orders   Lipid panel     Screening for endocrine, metabolic and immunity disorder       Relevant Orders   Comprehensive metabolic panel with GFR   Hemoglobin A1c      Modifiable risk factors discussed with patient. Anticipatory guidance according to age provided. The following topics were also discussed: Social Determinants of Health Smoking.  Non-smoker Diet and nutrition Benefits of exercise Cancer family history review Vaccinations review and recommendations Cardiovascular risk assessment Mental health including depression and anxiety Fall and accident prevention  Patient Instructions  Health Maintenance, Female Adopting a healthy lifestyle and getting preventive care are important in promoting health and wellness. Ask your health care provider about: The right schedule for you to have regular tests and exams. Things you can do on your own to prevent diseases and keep yourself healthy. What should I know about diet, weight, and exercise? Eat a healthy diet  Eat a diet that includes plenty of vegetables, fruits, low-fat dairy products, and lean protein. Do not eat a lot of foods that are high in solid fats, added sugars, or sodium. Maintain a healthy weight Body mass index (BMI) is used to identify weight problems. It estimates body fat based on height and weight. Your health care provider can help determine your BMI and help you achieve or maintain a healthy weight. Get regular exercise Get regular exercise. This is one of the most important things you can do for your health. Most adults should: Exercise for at least 150 minutes each week.  The exercise should increase your heart rate and make you sweat (moderate-intensity exercise). Do strengthening exercises at least twice a week. This is in addition to the moderate-intensity exercise. Spend less time sitting. Even light physical activity can be beneficial. Watch cholesterol and blood lipids Have your blood tested for lipids and cholesterol at 48 years of age, then have this test every 5 years. Have your cholesterol levels checked more often if: Your lipid or cholesterol levels are high. You are older than 48 years of age. You are at high risk for heart disease. What should I know about cancer screening? Depending on your health history and family history, you may need to have cancer screening at various ages. This may include screening for: Breast cancer. Cervical cancer. Colorectal cancer. Skin cancer. Lung cancer. What should I know about heart disease, diabetes, and high blood pressure? Blood pressure and heart disease High blood pressure causes heart disease and increases the risk of stroke. This is more likely to develop in people who have high blood pressure readings or are overweight. Have your blood pressure checked: Every 3-5 years if you are 38-48 years of age. Every year if you are 28 years old or older. Diabetes Have regular diabetes screenings. This checks your fasting blood sugar level. Have the screening done: Once every three years after age 41 if you are at a normal weight and have a low risk for diabetes.  More often and at a younger age if you are overweight or have a high risk for diabetes. What should I know about preventing infection? Hepatitis B If you have a higher risk for hepatitis B, you should be screened for this virus. Talk with your health care provider to find out if you are at risk for hepatitis B infection. Hepatitis C Testing is recommended for: Everyone born from [birth year] through 1965. Anyone with known risk factors for hepatitis  C. Sexually transmitted infections (STIs) Get screened for STIs, including gonorrhea and chlamydia, if: You are sexually active and are younger than 48 years of age. You are older than 48 years of age and your health care provider tells you that you are at risk for this type of infection. Your sexual activity has changed since you were last screened, and you are at increased risk for chlamydia or gonorrhea. Ask your health care provider if you are at risk. Ask your health care provider about whether you are at high risk for HIV. Your health care provider may recommend a prescription medicine to help prevent HIV infection. If you choose to take medicine to prevent HIV, you should first get tested for HIV. You should then be tested every 3 months for as long as you are taking the medicine. Pregnancy If you are about to stop having your period (premenopausal) and you may become pregnant, seek counseling before you get pregnant. Take 400 to 800 micrograms (mcg) of folic acid  every day if you become pregnant. Ask for birth control (contraception) if you want to prevent pregnancy. Osteoporosis and menopause Osteoporosis is a disease in which the bones lose minerals and strength with aging. This can result in bone fractures. If you are 32 years old or older, or if you are at risk for osteoporosis and fractures, ask your health care provider if you should: Be screened for bone loss. Take a calcium or vitamin D supplement to lower your risk of fractures. Be given hormone replacement therapy (HRT) to treat symptoms of menopause. Follow these instructions at home: Alcohol use Do not drink alcohol if: Your health care provider tells you not to drink. You are pregnant, may be pregnant, or are planning to become pregnant. If you drink alcohol: Limit how much you have to: 0-1 drink a day. Know how much alcohol is in your drink. In the [place] one drink equals one 12 oz bottle of beer (355 mL), one 5 oz glass  of wine (148 mL), or one 1 oz glass of hard liquor (44 mL). Lifestyle Do not use any products that contain nicotine or tobacco. These products include cigarettes, chewing tobacco, and vaping devices, such as e-cigarettes. If you need help quitting, ask your health care provider. Do not use street drugs. Do not share needles. Ask your health care provider for help if you need support or information about quitting drugs. General instructions Schedule regular health, dental, and eye exams. Stay current with your vaccines. Tell your health care provider if: You often feel depressed. You have ever been abused or do not feel safe at home. Summary Adopting a healthy lifestyle and getting preventive care are important in promoting health and wellness. Follow your health care provider's instructions about healthy diet, exercising, and getting tested or screened for diseases. Follow your health care provider's instructions on monitoring your cholesterol and blood pressure. This information is not intended to replace advice given to you by your health care provider. Make sure you discuss any questions you  have with your health care provider. Document Revised: 08/23/2020 Document Reviewed: 08/23/2020 Elsevier Patient Education  2024 Elsevier Inc.    Maryagnes Small, MD [facility]

## 2023-08-07 NOTE — Patient Instructions (Signed)

## 2023-08-07 NOTE — Assessment & Plan Note (Signed)
 Making progress.  Quit drinking 2 months ago with success but really started about 6 months ago.

## 2023-08-09 ENCOUNTER — Ambulatory Visit (INDEPENDENT_AMBULATORY_CARE_PROVIDER_SITE_OTHER): Payer: MEDICAID | Admitting: Licensed Clinical Social Worker

## 2023-08-09 DIAGNOSIS — F1020 Alcohol dependence, uncomplicated: Secondary | ICD-10-CM | POA: Diagnosis not present

## 2023-08-09 DIAGNOSIS — F331 Major depressive disorder, recurrent, moderate: Secondary | ICD-10-CM | POA: Diagnosis not present

## 2023-08-09 DIAGNOSIS — F4321 Adjustment disorder with depressed mood: Secondary | ICD-10-CM

## 2023-08-09 DIAGNOSIS — F4312 Post-traumatic stress disorder, chronic: Secondary | ICD-10-CM | POA: Diagnosis not present

## 2023-08-09 NOTE — Progress Notes (Signed)
 THERAPIST PROGRESS NOTE   Session Time:  1:00pm - 2:00pm    Location: Patient: [facility] Provider: [facility]   Participation Level: Active    Behavioral Response: Alert, casually dressed, anxious mood/affect   Type of Therapy:  Individual Therapy   Treatment Goals addressed: Depression/anxiety management; Medication compliance; Monitoring alcohol use  Progress Towards Goals: Progressing   Interventions: CBT, communication skills, problem solving    Summary: Drusilla Wampole is a 48 year old single AA female that presented for therapy appointment today with diagnosis of PTSD; Major depressive disorder, recurrent, moderate with anxious distress; Alcohol Use Disorder, moderate; and Unresolved Grief.  Suicidal/Homicidal: None; without intent or plan.     Therapist Response:  Clinician met with Anaika for in-person therapy session and assessed for safety, sobriety, and medication compliance.  Crystol presented for appointment on time and was alert, oriented x5, with no evidence or self-report of active SI/HI or A/V H.  Recie reported that she continues taking medication as prescribed and denied abuse of illicit substances.  Monetta reported that she has remained abstinent from alcohol since our previous session.  Clinician inquired about Belia's emotional ratings today, as well as any significant changes in thoughts, feelings, or behavior since previous check-in.  Noel reported scores of 4/10 for depression, 6/10 for anxiety, and 0/10 for anger/irritability.  Mishel denied any panic attacks or outbursts.  Nyoka reported that an ongoing success has been maintaining her sobriety despite stressors faced in relationships.  She reported that over the past few weeks, she has been more observant of unhealthy communication patterns with her partner, which usually leads them to break down completely when they cannot come to an understanding, stating "Its very draining".  Clinician discussed topic of  communication skills with Eleonore today to assist.  Clinician utilized a handout with [employer] that detailed various communication 'traps' that she might experience when interacting with current and past partners, and defined each one, including common examples such as criticism, defensiveness, contempt, stonewalling, overgeneralizing, arguing, and more.  Clinician inquired about which ones Delyla has engaged in, or seen demonstrated by her supports, and discussed strategies for assertively addressing each one successfully to strengthen relationships and communication skills.  Intervention was effective, as evidenced by Houa actively engaging in discussion on the subject, and reporting that her current partner can be very critical of her, and never does this in a constructive manner, which can lead her to become defensive.  Shakya reported that she would take accountability for mistakes, but this partner tends to gaslight, and this is similar to another ex's behavior, stating "Its like he wants a fight".  Darcy reported that this person will stonewall when she speaks up, and this makes her feel less important, so she plans to set a firmer boundary with him to avoid this triggering relapse.  She reported that she will also plan to have serious conversations in person or via phone calls, since texts have led to misunderstandings in the past.  Clinician will continue to monitor.                     Plan: Follow up in 1 week.   Diagnosis: PTSD; Major depressive disorder, recurrent, moderate with anxious distress; Alcohol Use Disorder, moderate; and Unresolved Grief.  Collaboration of Care:   None required at this time.  Patient/Guardian was advised Release of Information must be obtained prior to any record release in order to collaborate their care with an outside provider. Patient/Guardian was advised if they have not already done so to contact the registration  department to sign all necessary forms in order for us  to release information regarding their care.    Consent: Patient/Guardian gives verbal consent for treatment and assignment of benefits for services provided during this visit. Patient/Guardian expressed understanding and agreed to proceed.   Desmond Florida, LCSW, LCAS 08/09/23

## 2023-08-13 ENCOUNTER — Other Ambulatory Visit: Payer: Self-pay | Admitting: Emergency Medicine

## 2023-08-13 DIAGNOSIS — R928 Other abnormal and inconclusive findings on diagnostic imaging of breast: Secondary | ICD-10-CM

## 2023-08-14 NOTE — Progress Notes (Signed)
 Asymmetry means something looks different on that side and needs further investigation.  Not much we can do about changing May 21 appointment.  Thanks.

## 2023-08-15 ENCOUNTER — Encounter: Payer: Self-pay | Admitting: Radiology

## 2023-08-23 ENCOUNTER — Ambulatory Visit (INDEPENDENT_AMBULATORY_CARE_PROVIDER_SITE_OTHER): Payer: MEDICAID | Admitting: Licensed Clinical Social Worker

## 2023-08-23 DIAGNOSIS — F1020 Alcohol dependence, uncomplicated: Secondary | ICD-10-CM

## 2023-08-23 DIAGNOSIS — F331 Major depressive disorder, recurrent, moderate: Secondary | ICD-10-CM | POA: Diagnosis not present

## 2023-08-23 DIAGNOSIS — F4312 Post-traumatic stress disorder, chronic: Secondary | ICD-10-CM | POA: Diagnosis not present

## 2023-08-23 DIAGNOSIS — F4321 Adjustment disorder with depressed mood: Secondary | ICD-10-CM | POA: Diagnosis not present

## 2023-08-23 NOTE — Progress Notes (Signed)
 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  Suicidal/Homicidal: None; without intent or plan.     Therapist Response:  Clinician met with Shekia for in-person therapy appointment and assessed for safety, sobriety, and medication compliance.  Kelbie presented for session on time and was alert, oriented x5, with no evidence or self-report of active SI/HI or A/V H.  Lucretia reported that she continues taking medication as prescribed and denied abuse of illicit substances.  Liticia reported that she has remained abstinent from alcohol since our last session.  Clinician inquired about Deseray's current emotional ratings, as well as any significant changes in thoughts, feelings, or behavior since last check-in.  Mareena reported scores of 4/10 for depression, 8/10 for anxiety, and 0/10 for anger/irritability.  Laasya denied any outbursts.  Alishea reported that an ongoing success has been staying sober from alcohol.  She reported that a recent struggle was getting a mammogram last week, where they discovered 2 lumps in her right breast.  Oletha reported that she has an ultrasound scheduled for Monday and acknowledged that when she received this news, she briefly considered drinking again.  Thena stated "I stopped drinking and now I may have breast cancer, so I might as well get  drunk".  Clinician utilized relapse prevention handout on topic of justifications with Marzell to assist in curbing substance abuse patterns when faced with stressors.  Several common relapse justification scenarios were presented for consideration, including blaming someone else, catastrophic events, using for specific purposes, or self-medicating unpleasant emotions such as anxiety, anger, loneliness or fear.  Clinician explored healthier alternatives for coping with challenges like this via use of positive self-talk, exercise, or speaking with a sober peer for support.  Clinician also discussed refusal skills that could be implemented if someone were to suggest that she have to drink again following any more bad news.  Intervention was effective, as evidenced by Neima's active engagement in discussion on topic and receptiveness to information covered.  Lavra reported that when she received the news, this was a catastrophic event in her eyes, and made her feel anxious, fearful, and angry.  She reported that she was able to look more on the rational side of things though, and remind herself that not only could these lumps be benign, but treatment for early detective is possible, and drinking would not improve recovery chances.  Keeara reported that this weekend she will focus on self-care activities as a distraction, including exercise, working her job, and talking to family and friends that are supportive, such as her son.  She stated "I have to focus more on positive things".  Clinician will continue to monitor.                     Plan: Follow up in 1 week.   Diagnosis: PTSD; Major depressive disorder, recurrent, moderate with anxious distress; Alcohol Use Disorder, moderate; and Unresolved Grief.  Collaboration of Care:   None required at this time.                                                   Patient/Guardian was advised Release of Information must be obtained prior to any record release in order to  collaborate their care with an outside provider. Patient/Guardian was advised if they have not already done so to contact the registration department to sign all necessary forms in order for us  to release information regarding their care.    Consent: Patient/Guardian gives verbal consent for treatment and assignment of benefits for services provided during this visit. Patient/Guardian expressed understanding and agreed to proceed.   Desmond Florida, LCSW, LCAS 08/23/23

## 2023-08-25 ENCOUNTER — Other Ambulatory Visit (HOSPITAL_COMMUNITY): Payer: Self-pay | Admitting: Psychiatry

## 2023-08-25 DIAGNOSIS — F10188 Alcohol abuse with other alcohol-induced disorder: Secondary | ICD-10-CM

## 2023-08-25 DIAGNOSIS — F322 Major depressive disorder, single episode, severe without psychotic features: Secondary | ICD-10-CM

## 2023-08-25 DIAGNOSIS — Z62819 Personal history of unspecified abuse in childhood: Secondary | ICD-10-CM

## 2023-08-25 DIAGNOSIS — F4312 Post-traumatic stress disorder, chronic: Secondary | ICD-10-CM

## 2023-08-27 ENCOUNTER — Ambulatory Visit
Admission: RE | Admit: 2023-08-27 | Discharge: 2023-08-27 | Disposition: A | Discharge status: Home / Self Care | Payer: MEDICAID | Source: Ambulatory Visit | Attending: Emergency Medicine

## 2023-08-27 ENCOUNTER — Ambulatory Visit
Admission: RE | Admit: 2023-08-27 | Discharge: 2023-08-27 | Disposition: A | Discharge status: Home / Self Care | Payer: MEDICAID | Source: Ambulatory Visit | Attending: Emergency Medicine | Admitting: Emergency Medicine

## 2023-08-27 ENCOUNTER — Ambulatory Visit: Payer: MEDICAID

## 2023-08-27 DIAGNOSIS — R928 Other abnormal and inconclusive findings on diagnostic imaging of breast: Secondary | ICD-10-CM

## 2023-08-30 ENCOUNTER — Other Ambulatory Visit (HOSPITAL_COMMUNITY): Payer: Self-pay | Admitting: Psychiatry

## 2023-08-30 ENCOUNTER — Encounter (HOSPITAL_COMMUNITY): Payer: Self-pay | Admitting: Psychiatry

## 2023-08-30 ENCOUNTER — Telehealth (HOSPITAL_COMMUNITY): Payer: MEDICAID | Admitting: Psychiatry

## 2023-08-30 DIAGNOSIS — F4312 Post-traumatic stress disorder, chronic: Secondary | ICD-10-CM | POA: Diagnosis not present

## 2023-08-30 DIAGNOSIS — F322 Major depressive disorder, single episode, severe without psychotic features: Secondary | ICD-10-CM | POA: Diagnosis not present

## 2023-08-30 DIAGNOSIS — F10188 Alcohol abuse with other alcohol-induced disorder: Secondary | ICD-10-CM

## 2023-08-30 DIAGNOSIS — Z62819 Personal history of unspecified abuse in childhood: Secondary | ICD-10-CM

## 2023-08-30 MED ORDER — NALTREXONE HCL 50 MG PO TABS: 50.0000 mg | ORAL_TABLET | Freq: Every day | ORAL | 2 refills | Status: DC

## 2023-08-30 MED ORDER — FLUOXETINE HCL 10 MG PO CAPS
10.0000 mg | ORAL_CAPSULE | Freq: Every day | ORAL | 2 refills | Status: DC
Start: 2023-08-30 — End: 2023-11-29
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
## 2023-09-03 ENCOUNTER — Ambulatory Visit (INDEPENDENT_AMBULATORY_CARE_PROVIDER_SITE_OTHER): Payer: MEDICAID | Admitting: Licensed Clinical Social Worker

## 2023-09-03 DIAGNOSIS — F4321 Adjustment disorder with depressed mood: Secondary | ICD-10-CM

## 2023-09-03 DIAGNOSIS — F1020 Alcohol dependence, uncomplicated: Secondary | ICD-10-CM

## 2023-09-03 DIAGNOSIS — F331 Major depressive disorder, recurrent, moderate: Secondary | ICD-10-CM

## 2023-09-03 DIAGNOSIS — F4312 Post-traumatic stress disorder, chronic: Secondary | ICD-10-CM | POA: Diagnosis not present
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
## 2023-09-05 ENCOUNTER — Other Ambulatory Visit: Payer: MEDICAID

## 2023-09-27 ENCOUNTER — Ambulatory Visit (HOSPITAL_COMMUNITY): Payer: MEDICAID | Admitting: Licensed Clinical Social Worker

## 2023-10-09 ENCOUNTER — Telehealth (HOSPITAL_COMMUNITY): Payer: Self-pay | Admitting: *Deleted

## 2023-10-09 ENCOUNTER — Telehealth (HOSPITAL_COMMUNITY): Payer: MEDICAID | Admitting: Licensed Clinical Social Worker

## 2023-10-09 ENCOUNTER — Ambulatory Visit (HOSPITAL_COMMUNITY): Payer: MEDICAID | Admitting: Licensed Clinical Social Worker

## 2023-10-09 NOTE — Telephone Encounter (Signed)
 Danielle Reilly had an in-person therapy appointment scheduled today at 2pm.  Clinician outreached Danielle Reilly by phone at 2:08pm when she did not present on time.  This call went to voicemail, so clinician left a message reminding her of today's appointment and provided contact numbers for provider office, and front desk staff.  Clinician informed front desk staff of no show event when client did not present by 2:15pm.      [name], LCSW, LCAS 10/09/23

## 2023-10-09 NOTE — Telephone Encounter (Signed)
 Pt of Dr. Veto called requesting a script for Librium . Pt says she stopped taking the Naltrexone  recently and relapsed on alcohol. Pt says she has restarted the medication and has used Librium  in the past when she has relapsed. Dr. Arfeen has never prescribed this medication for this pt. MAR shows that pt has had scripts this year but they were from ED visits. Pt has a f/u with Dr. Curry on 11/29/23 and was last seen on 08/30/23. Pt also sees therapist in this office. Please review and advise.

## 2023-10-09 NOTE — Telephone Encounter (Signed)
 Writer did suggest SA group which pt refused. LVM for pt to suggest AA meetings, continue naltrexone , go to ED if experiencing withdrawal symptoms. Pt enviuaged to call us  back with any further questions or concerns.

## 2023-10-10 ENCOUNTER — Emergency Department (HOSPITAL_COMMUNITY): Payer: MEDICAID

## 2023-10-10 ENCOUNTER — Emergency Department (HOSPITAL_COMMUNITY)
Admission: EM | Admit: 2023-10-10 | Discharge: 2023-10-11 | Disposition: A | Discharge status: Home / Self Care | Payer: MEDICAID | Attending: Emergency Medicine | Admitting: Emergency Medicine

## 2023-10-10 ENCOUNTER — Other Ambulatory Visit: Payer: Self-pay

## 2023-10-10 DIAGNOSIS — F1010 Alcohol abuse, uncomplicated: Secondary | ICD-10-CM | POA: Insufficient documentation

## 2023-10-10 DIAGNOSIS — R1031 Right lower quadrant pain: Secondary | ICD-10-CM | POA: Insufficient documentation

## 2023-10-10 DIAGNOSIS — N3000 Acute cystitis without hematuria: Secondary | ICD-10-CM

## 2023-10-10 LAB — URINALYSIS, ROUTINE W REFLEX MICROSCOPIC
Bilirubin Urine: NEGATIVE
Glucose, UA: NEGATIVE mg/dL
Ketones, ur: 5 mg/dL — AB
Nitrite: NEGATIVE
Protein, ur: NEGATIVE mg/dL
Specific Gravity, Urine: 1.011 (ref 1.005–1.030)
pH: 6 (ref 5.0–8.0)

## 2023-10-10 LAB — COMPREHENSIVE METABOLIC PANEL WITH GFR
ALT: 47 U/L — ABNORMAL HIGH (ref 0–44)
ALT: 47 U/L — ABNORMAL HIGH (ref 0–44)
AST: 95 U/L — ABNORMAL HIGH (ref 15–41)
AST: 89 U/L — ABNORMAL HIGH (ref 15–41)
Albumin: 4.3 g/dL (ref 3.5–5.0)
Albumin: 4.4 g/dL (ref 3.5–5.0)
Alkaline Phosphatase: 67 U/L (ref 38–126)
Alkaline Phosphatase: 64 U/L (ref 38–126)
Anion gap: 14 (ref 5–15)
Anion gap: 15 (ref 5–15)
BUN: 7 mg/dL (ref 6–20)
BUN: 8 mg/dL (ref 6–20)
CO2: 24 mmol/L (ref 22–32)
CO2: 25 mmol/L (ref 22–32)
Calcium: 10 mg/dL (ref 8.9–10.3)
Calcium: 9.9 mg/dL (ref 8.9–10.3)
Chloride: 98 mmol/L (ref 98–111)
Chloride: 96 mmol/L — ABNORMAL LOW (ref 98–111)
Creatinine, Ser: 0.88 mg/dL (ref 0.44–1.00)
Creatinine, Ser: 0.9 mg/dL (ref 0.44–1.00)
GFR, Estimated: >60 mL/min (ref >60)
GFR, Estimated: >60 mL/min (ref >60)
Glucose, Bld: 119 mg/dL — ABNORMAL HIGH (ref 70–99)
Glucose, Bld: 113 mg/dL — ABNORMAL HIGH (ref 70–99)
Potassium: 3.5 mmol/L (ref 3.5–5.1)
Potassium: 3.4 mmol/L — ABNORMAL LOW (ref 3.5–5.1)
Sodium: 136 mmol/L (ref 135–145)
Sodium: 136 mmol/L (ref 135–145)
Total Bilirubin: 1.5 mg/dL — ABNORMAL HIGH (ref 0.0–1.2)
Total Bilirubin: 1.4 mg/dL — ABNORMAL HIGH (ref 0.0–1.2)
Total Protein: 7.9 g/dL (ref 6.5–8.1)
Total Protein: 8.2 g/dL — ABNORMAL HIGH (ref 6.5–8.1)

## 2023-10-10 LAB — CBC WITH DIFFERENTIAL/PLATELET
Abs Immature Granulocytes: 0.01 10*3/uL (ref 0.00–0.07)
Abs Immature Granulocytes: 0.01 10*3/uL (ref 0.00–0.07)
Basophils Absolute: 0 10*3/uL (ref 0.0–0.1)
Basophils Absolute: 0 10*3/uL (ref 0.0–0.1)
Basophils Relative: 0 %
Basophils Relative: 1 %
Eosinophils Absolute: 0 10*3/uL (ref 0.0–0.5)
Eosinophils Absolute: 0 10*3/uL (ref 0.0–0.5)
Eosinophils Relative: 0 %
Eosinophils Relative: 0 %
HCT: 39.2 % (ref 36.0–46.0)
HCT: 40.6 % (ref 36.0–46.0)
Hemoglobin: 12.8 g/dL (ref 12.0–15.0)
Hemoglobin: 13 g/dL (ref 12.0–15.0)
Immature Granulocytes: 0 %
Immature Granulocytes: 0 %
Lymphocytes Relative: 24 %
Lymphocytes Relative: 22 %
Lymphs Abs: 1.2 10*3/uL (ref 0.7–4.0)
Lymphs Abs: 1.3 10*3/uL (ref 0.7–4.0)
MCH: 27.4 pg (ref 26.0–34.0)
MCH: 27.3 pg (ref 26.0–34.0)
MCHC: 32.7 g/dL (ref 30.0–36.0)
MCHC: 32 g/dL (ref 30.0–36.0)
MCV: 83.9 fL (ref 80.0–100.0)
MCV: 85.1 fL (ref 80.0–100.0)
Monocytes Absolute: 0.3 10*3/uL (ref 0.1–1.0)
Monocytes Absolute: 0.4 10*3/uL (ref 0.1–1.0)
Monocytes Relative: 6 %
Monocytes Relative: 6 %
Neutro Abs: 3.5 10*3/uL (ref 1.7–7.7)
Neutro Abs: 4.1 10*3/uL (ref 1.7–7.7)
Neutrophils Relative %: 70 %
Neutrophils Relative %: 71 %
Platelets: 187 10*3/uL (ref 150–400)
Platelets: 182 10*3/uL (ref 150–400)
RBC: 4.67 MIL/uL (ref 3.87–5.11)
RBC: 4.77 MIL/uL (ref 3.87–5.11)
RDW: 13.5 % (ref 11.5–15.5)
RDW: 13.6 % (ref 11.5–15.5)
WBC: 5.1 10*3/uL (ref 4.0–10.5)
WBC: 5.8 10*3/uL (ref 4.0–10.5)
nRBC: 0 % (ref 0.0–0.2)
nRBC: 0 % (ref 0.0–0.2)

## 2023-10-10 LAB — ETHANOL: Alcohol, Ethyl (B): <15 mg/dL (ref <15)

## 2023-10-10 LAB — RAPID URINE DRUG SCREEN, HOSP PERFORMED
Amphetamines: NOT DETECTED
Barbiturates: NOT DETECTED
Benzodiazepines: NOT DETECTED
Cocaine: NOT DETECTED
Opiates: NOT DETECTED
Tetrahydrocannabinol: NOT DETECTED

## 2023-10-10 LAB — LIPASE, BLOOD
Lipase: 27 U/L (ref 11–51)
Lipase: 27 U/L (ref 11–51)

## 2023-10-10 LAB — PREGNANCY, URINE: Preg Test, Ur: NEGATIVE

## 2023-10-10 MED ORDER — IOHEXOL 350 MG/ML SOLN
60.0000 mL | Freq: Once | INTRAVENOUS | Status: AC | PRN
  Administered 2023-10-10: 60 mL via INTRAVENOUS

## 2023-10-10 MED ORDER — HYDROMORPHONE HCL 1 MG/ML IJ SOLN
1.0000 mg | Freq: Once | INTRAMUSCULAR | Status: AC
  Administered 2023-10-10: 1 mg via INTRAVENOUS
  Filled 2023-10-10: qty 1

## 2023-10-10 MED ORDER — LORAZEPAM 2 MG/ML IJ SOLN
1.0000 mg | Freq: Once | INTRAMUSCULAR | Status: AC
  Administered 2023-10-10: 1 mg via INTRAVENOUS
  Filled 2023-10-10: qty 1

## 2023-10-10 MED ORDER — ONDANSETRON 4 MG PO TBDP
4.0000 mg | ORAL_TABLET | Freq: Once | ORAL | Status: AC
Start: 2023-10-10 — End: 2023-10-10
  Administered 2023-10-10: 4 mg via ORAL
  Filled 2023-10-10: qty 1

## 2023-10-10 MED ORDER — SODIUM CHLORIDE 0.9 % IV SOLN: INTRAVENOUS | Status: DC

## 2023-10-10 MED ORDER — CEPHALEXIN 500 MG PO CAPS: 500.0000 mg | ORAL_CAPSULE | Freq: Four times a day (QID) | ORAL | 0 refills | Status: DC

## 2023-10-10 MED ORDER — ALUM & MAG HYDROXIDE-SIMETH 200-200-20 MG/5ML PO SUSP
30.0000 mL | Freq: Once | ORAL | Status: AC
  Administered 2023-10-10: 30 mL via ORAL
  Filled 2023-10-10: qty 30

## 2023-10-10 MED ORDER — SODIUM CHLORIDE 0.9 % IV BOLUS
500.0000 mL | Freq: Once | INTRAVENOUS | Status: AC
  Administered 2023-10-10: 500 mL via INTRAVENOUS

## 2023-10-10 MED ORDER — LORAZEPAM 1 MG PO TABS: 1.0000 mg | ORAL_TABLET | Freq: Three times a day (TID) | ORAL | 0 refills | Status: DC | PRN

## 2023-10-10 MED ORDER — ONDANSETRON HCL 4 MG/2ML IJ SOLN
4.0000 mg | Freq: Once | INTRAMUSCULAR | Status: AC
  Administered 2023-10-10: 4 mg via INTRAVENOUS
  Filled 2023-10-10: qty 2

## 2023-10-10 MED ORDER — SODIUM CHLORIDE 0.9 % IV BOLUS
1000.0000 mL | Freq: Once | INTRAVENOUS | Status: AC
  Administered 2023-10-10: 1000 mL via INTRAVENOUS

## 2023-10-10 NOTE — ED Notes (Signed)
 Pt wanted labs redrawn, Pt states her labs were mislabeled due to phlebotomy using sunquest labels, instead of printed Pt labels. This phlebotomist asked Psychologist, educational Colleen to observe while redrawing Pts labs. Completed redraw with manager present and sent Pt back to lobby.

## 2023-10-10 NOTE — ED Notes (Signed)
 Pt states urine specimen was already given and sent to lab. This RN called to confirm with lab, lab states they have the sample and will run it shortly.

## 2023-10-10 NOTE — ED Notes (Signed)
 Pt wanting labs re ordered bc she does not believe that her labs were labeled correctly. Informed pt that sunquest labels were used that's why her labels are not used. Pt would like labs redrawn

## 2023-10-10 NOTE — ED Triage Notes (Signed)
 Pt was sober and last drink was 3 days ago. Hix of withdrawals but does not feel that way this time.

## 2023-10-10 NOTE — Discharge Instructions (Signed)
 Make an appointment to follow-up with your primary care doctor.  Take the Ativan  as directed.  Avoid alcohol.  Take the Keflex as directed for possible urinary tract infection.  Urine has been sent for culture.  Return for any new or worse symptoms.

## 2023-10-10 NOTE — ED Provider Triage Note (Signed)
 Emergency Medicine Provider Triage Evaluation Note  Danielle Reilly , a 48 y.o. female  was evaluated in triage.  Pt complains of feeling dehydrated, was sober for approximately 6 months, relapsed about a week ago and had been drinking a lot daily .  Her last drink was 3 days ago.  She is coming in because she feels like something is off.  Prior history of withdrawals but does not feel that this is likely the same.  Also endorsing abdominal pain with a prior history of pancreatitis.  Review of Systems  Positive: Palpitations, abdominal pain Negative: Fever, vomiting  Physical Exam  LMP 06/19/2013  Gen:   Awake, no distress   Resp:  Normal effort  MSK:   Moves extremities without difficulty  Other:  Generalized abdominal pain, no guarding   Medical Decision Making  Medically screening exam initiated at 12:47 PM.  Appropriate orders placed.  Danielle Reilly was informed that the remainder of the evaluation will be completed by another provider, this initial triage assessment does not replace that evaluation, and the importance of remaining in the ED until their evaluation is complete.     Danielle Poplaski, PA-C 10/10/23 1251

## 2023-10-10 NOTE — ED Provider Notes (Addendum)
  EMERGENCY DEPARTMENT AT [facility] Provider Note   CSN: [id number] Arrival date & time: 10/10/23  1204     Patient presents with: No chief complaint on file.   Danielle Reilly is a 48 y.o. female.   Patient here with concern for alcohol withdrawal.  Patient feeling strange in the head.  Feeling hydrated.  Patient feeling dizzy.  Patient has a history of relapse on drinking alcohol last alcohol was 3 days ago.  Past medical history significant for gastroesophageal reflux disease acute pancreatitis substance abuse.  Patient last seen in the emergency department on July 12, 2023 for nonspecific chest pain.  Patient denies any nausea or vomiting.  Patient denies seeing or hearing anything.  No tremors.  Patient states has been drinking heavily though leading up to 3 days ago.  Patient also with a complaint abdominal pain, right sided right lower quadrant.  Patient states she has had pancreatitis in the past.       Prior to Admission medications   Medication Sig Start Date End Date Taking? Authorizing Provider  azelastine  (ASTELIN ) 0.1 % nasal spray Place 2 sprays into both nostrils 2 (two) times daily. Use in each nostril as directed 08/07/23   Purcell Emil Schanz, MD  FLUoxetine  (PROZAC ) 10 MG capsule Take 1 capsule (10 mg total) by mouth daily. 08/30/23   Arfeen, Syed T, MD  fluticasone (FLONASE) 50 MCG/ACT nasal spray Place 1 spray into both nostrils daily as needed for allergies or rhinitis.    [provider]  Multiple Vitamin (MULTIVITAMINS PO) Take 1 tablet by mouth daily.    [provider]  naltrexone  (DEPADE) 50 MG tablet Take 1 tablet (50 mg total) by mouth daily. 08/30/23   Arfeen, Leni DASEN, MD  pantoprazole  (PROTONIX ) 40 MG tablet Take 1 tablet (40 mg total) by mouth daily. 07/13/23   Mesner, Selinda, MD  famotidine  (PEPCID ) 20 MG tablet Take 1 tablet (20 mg total) by mouth daily. Patient not taking: Reported on 10/28/2019 05/19/19 04/06/20   Dean Clarity, MD    Allergies: Fluconazole  and Hydrocodone     Review of Systems  Constitutional:  Negative for chills and fever.  HENT:  Negative for ear pain and sore throat.   Eyes:  Negative for pain and visual disturbance.  Respiratory:  Negative for cough and shortness of breath.   Cardiovascular:  Negative for chest pain and palpitations.  Gastrointestinal:  Positive for abdominal pain. Negative for vomiting.  Genitourinary:  Negative for dysuria and hematuria.  Musculoskeletal:  Negative for arthralgias and back pain.  Skin:  Negative for color change and rash.  Neurological:  Positive for dizziness and light-headedness. Negative for tremors, seizures and syncope.  All other systems reviewed and are negative.   Updated Vital Signs BP (!) 133/92   Pulse (!) 101   Temp 98 F (36.7 C) (Oral)   Resp (!) 21   Ht 1.676 m (5' 6)   Wt 81.6 kg   LMP 06/19/2013   SpO2 100%   BMI 29.04 kg/m   Physical Exam Vitals and nursing note reviewed.  Constitutional:      General: She is not in acute distress.    Appearance: Normal appearance. She is well-developed.  HENT:     Head: Normocephalic and atraumatic.     Mouth/Throat:     Mouth: Mucous membranes are dry.   Eyes:     Extraocular Movements: Extraocular movements intact.     Conjunctiva/sclera: Conjunctivae normal.  Pupils: Pupils are equal, round, and reactive to light.    Cardiovascular:     Rate and Rhythm: Regular rhythm. Tachycardia present.     Heart sounds: No murmur heard. Pulmonary:     Effort: Pulmonary effort is normal. No respiratory distress.     Breath sounds: Normal breath sounds.  Abdominal:     Palpations: Abdomen is soft.     Tenderness: There is abdominal tenderness.     Comments: Mild tenderness right side abdomen somewhat look right lower quadrant.   Musculoskeletal:        General: No swelling.     Cervical back: Neck supple.   Skin:    General: Skin is warm and dry.      Capillary Refill: Capillary refill takes less than 2 seconds.   Neurological:     General: No focal deficit present.     Mental Status: She is alert and oriented to person, place, and time.   Psychiatric:        Mood and Affect: Mood normal.     (all labs ordered are listed, but only abnormal results are displayed) Labs Reviewed  COMPREHENSIVE METABOLIC PANEL WITH GFR - Abnormal; Notable for the following components:      Result Value   Glucose, Bld 119 (*)    AST 95 (*)    ALT 47 (*)    Total Bilirubin 1.5 (*)    All other components within normal limits  COMPREHENSIVE METABOLIC PANEL WITH GFR - Abnormal; Notable for the following components:   Potassium 3.4 (*)    Chloride 96 (*)    Glucose, Bld 113 (*)    Total Protein 8.2 (*)    AST 89 (*)    ALT 47 (*)    Total Bilirubin 1.4 (*)    All other components within normal limits  CBC WITH DIFFERENTIAL/PLATELET  LIPASE, BLOOD  CBC WITH DIFFERENTIAL/PLATELET  LIPASE, BLOOD  RAPID URINE DRUG SCREEN, HOSP PERFORMED  ETHANOL    EKG: EKG Interpretation Date/Time:  Wednesday October 10 2023 17:40:19 EDT Ventricular Rate:  105 PR Interval:  137 QRS Duration:  74 QT Interval:  349 QTC Calculation: 462 R Axis:   69  Text Interpretation: Sinus tachycardia Borderline repolarization abnormality Confirmed by Normalee Sistare [phone]) on 10/10/2023 5:42:14 PM  Radiology: No results found.   Procedures   Medications Ordered in the ED  sodium chloride  0.9 % bolus 1,000 mL (has no administration in time range)  0.9 %  sodium chloride  infusion (has no administration in time range)  LORazepam  (ATIVAN ) injection 1 mg (has no administration in time range)  ondansetron  (ZOFRAN -ODT) disintegrating tablet 4 mg (4 mg Oral Given 10/10/23 1707)                                    Medical Decision Making Amount and/or Complexity of Data Reviewed Labs: ordered. Radiology: ordered.  Risk OTC drugs. Prescription drug  management.   Patient appears nontoxic no acute distress.  Patient does appear little dehydrated.  There is no tremors.  Slight tachycardia.  Some mild discomfort in the abdomen on the right side.  Patient without any outward withdrawal symptoms.  Will give some Ativan  here will get CT scan abdomen pelvis.  Will give IV fluids.  On cardiac monitor patient's heart rates been right around 100.  Alcohol level is less than 15 CBC white count was 5.8 hemoglobin 13.0 platelets  were normal.  Complete metabolic panel AST ALT up slightly 8947 respectively alk phos was 64 total bili up 1.4 patient's bili has been elevated before anion gap normal.  Lipase normal so no evidence of any acute pancreatitis by labs.  Urine drug screen was negative.  Urinalysis WBCs 11-20 many bacteria.  Based on this we will send for urine culture and will probably treat with Keflex.  Pregnancy test negative.  CBC was done twice not sure why no significant change in the repeat.  CT abdomen and pelvis no evidence of any acute process.  Patient will be treated with some Ativan  and Keflex for possible urinary tract infection.  Final diagnoses:  Alcohol abuse  Right lower quadrant abdominal pain    ED Discharge Orders     None          Geraldene Hamilton, MD 10/10/23 1757    Geraldene Hamilton, MD 10/10/23 2308

## 2023-10-10 NOTE — ED Notes (Signed)
 ED Provider at bedside.

## 2023-10-10 NOTE — ED Notes (Signed)
 Extra Red top drawn

## 2023-10-11 LAB — URINE CULTURE: Culture: NO GROWTH

## 2023-10-16 ENCOUNTER — Ambulatory Visit (INDEPENDENT_AMBULATORY_CARE_PROVIDER_SITE_OTHER): Payer: MEDICAID | Admitting: Licensed Clinical Social Worker

## 2023-10-16 DIAGNOSIS — F331 Major depressive disorder, recurrent, moderate: Secondary | ICD-10-CM | POA: Diagnosis not present

## 2023-10-16 DIAGNOSIS — F4321 Adjustment disorder with depressed mood: Secondary | ICD-10-CM | POA: Diagnosis not present

## 2023-10-16 DIAGNOSIS — F4310 Post-traumatic stress disorder, unspecified: Secondary | ICD-10-CM

## 2023-10-16 DIAGNOSIS — F1020 Alcohol dependence, uncomplicated: Secondary | ICD-10-CM

## 2023-10-16 DIAGNOSIS — F4312 Post-traumatic stress disorder, chronic: Secondary | ICD-10-CM

## 2023-10-16 NOTE — Progress Notes (Signed)
 Virtual Visit via Video Note   I connected with Laquisha Killgore on 10/16/23 at 3:00pm by video enabled telemedicine application and verified that I am speaking with the correct person using two identifiers.   I discussed the limitations, risks, security and privacy concerns of performing an evaluation and management service by video and the availability of in person appointments. I also discussed with the patient that there may be a patient responsible charge related to this service. The patient expressed understanding and agreed to proceed.   I discussed the assessment and treatment plan with the patient. The patient was provided an opportunity to ask questions and all were answered. The patient agreed with the plan and demonstrated an understanding of the instructions.   The patient was advised to call back or seek an in-person evaluation if the symptoms worsen or if the condition fails to improve as anticipated.   I provided 52 minutes of non-face-to-face time during this encounter.     [name], LCSW, LCAS ________________________________  THERAPIST PROGRESS NOTE   Session Time:  3:00pm - 3:52pm    Location: Patient: Patient Home Provider: [facility]   Participation Level: Active    Behavioral Response: Alert, casually dressed, anxious mood/affect   Type of Therapy:  Individual Therapy   Treatment Goals addressed: Depression/anxiety management; Medication compliance; Monitoring alcohol use  Progress Towards Goals: Progressing   Interventions: CBT, relapse prevention, supportive therapy      Summary: Vonya Ohalloran is a 48 year old single AA female that presented for therapy appointment today with diagnosis of PTSD; Major depressive disorder, recurrent, moderate with anxious distress; Alcohol Use Disorder, moderate; and Unresolved Grief.  Suicidal/Homicidal: None; without intent or plan.     Therapist Response:  Clinician met with Shelah for virtual therapy appointment and  assessed for safety, sobriety, and medication compliance.  Maripat presented for session on time and was alert, oriented x5, with no evidence or self-report of active SI/HI or A/V H.  Genice reported that she was only taking medication 2-3x per week but has started taking it daily again after recent hospitalization.  She denied abuse of illicit substances.  Arionna reported that she did end up relapsing on alcohol at a recent wedding when she had one drink, and then started binge drinking an unknown amount for 3 days after Fathers Day before admitting herself to the ED for dehydration.  Clinician inquired about Shadiamond's current emotional ratings, as well as any significant changes in thoughts, feelings, or behavior since last check-in.  Jameah reported scores of 5/10 for depression, 7/10 for anxiety, and 5/10 for anger/irritability.  Zahria denied any panic attacks or outbursts.  Clinician revisited relapse prevention plan with Gimena to determine what factors may have contributed to recent binge drinking episode.  This included analysis of internal and external triggers she had been exposed to, and whether she practiced any coping skills that have been taught in therapy to disrupt the relapse cycle.  Clinician also reviewed justifications with Danette that may have contributed to this, including catastrophic events, blaming other people, testing herself, celebrations, and more.  Meridee reported that her problems began at the wedding for her niece, as her sister was there, and she is an alcoholic too.  Rocio reported that her sister was negative, loud, and tried to provoke her several times.  Roxine reported that alcohol was also readily available.  Kanesha reported that at the end of the night she started arguing with her sister about something, and stated "  I drank to calm my nerves.  I thought I would be okay if it was just one".  Breeann reported that on fathers day she was feeling down, her sister called drunk, and this led her  to go to the liquor store and binge drank 1/5 of liquor for 3 days in a row before she ended up in the hospital.  Malka reported that she is realizing her sister is her biggest trigger but she is trying not to use this as a justification since she could choose not to talk to her.  Clinician recommended that Sherica consider setting firmer boundaries with non-sober members of her support network in order to improve chances of maintaining sobriety.  Clinician also discussed resources that could aid in strengthening her network, including participation in [facility] through our office, and local AA groups.  Interventions were effective, as evidenced by Aleeya reporting that it was helpful to discuss relapse event, since this helped her realize that she will need to avoid interaction with her sister when this person is under the influence, and she will consider joining an AA group to improve network and accountability toward recovery efforts. Adraine stated "I just have to stop beating myself up over the relapse and move forward".  Clinician will continue to monitor.                     Plan: Follow up in 1 week.   Diagnosis: PTSD; Major depressive disorder, recurrent, moderate with anxious distress; Alcohol Use Disorder, moderate; and Unresolved Grief.  Collaboration of Care:   None required at this time.                                                   Patient/Guardian was advised Release of Information must be obtained prior to any record release in order to collaborate their care with an outside provider. Patient/Guardian was advised if they have not already done so to contact the registration department to sign all necessary forms in order for us  to release information regarding their care.    Consent: Patient/Guardian gives verbal consent for treatment and assignment of benefits for services provided during this visit. Patient/Guardian expressed understanding and agreed to proceed.   [name], LCSW,  LCAS 10/16/23

## 2023-11-03 IMAGING — CT CT ABD-PELV W/ CM
2 of 5 series · 17 of 46 positions shown, 19 images · IV contrast (Omni 300)
Comparison: CT dated June 17, 2019

CLINICAL DATA: Nausea/vomiting Abdominal pain, acute, nonlocalized

EXAM:
CT ABDOMEN AND PELVIS WITH CONTRAST
TECHNIQUE: Multidetector CT imaging of the abdomen and pelvis was performed
using the standard protocol following bolus administration of
intravenous contrast.

[Series 3: a/p w/ 5mm · axial · 0.69mm/px · z∈[+41,+476]mm · 14 of 97 slices shown, 16 images]
[im 5/97  soft-tissue]
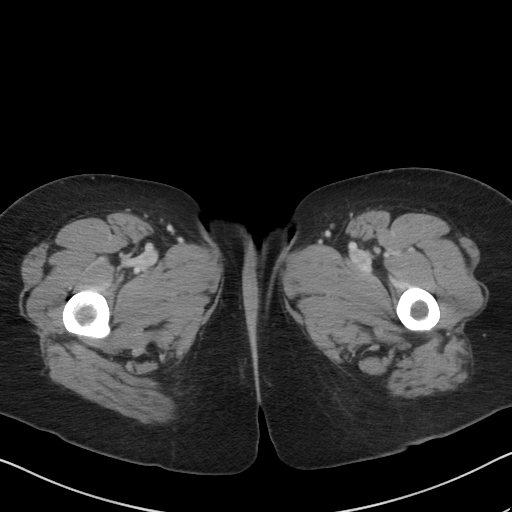
[im 5/97  bone]
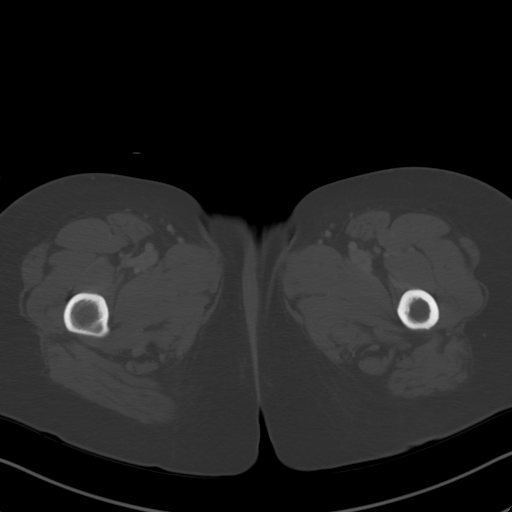
[im 14/97  soft-tissue]
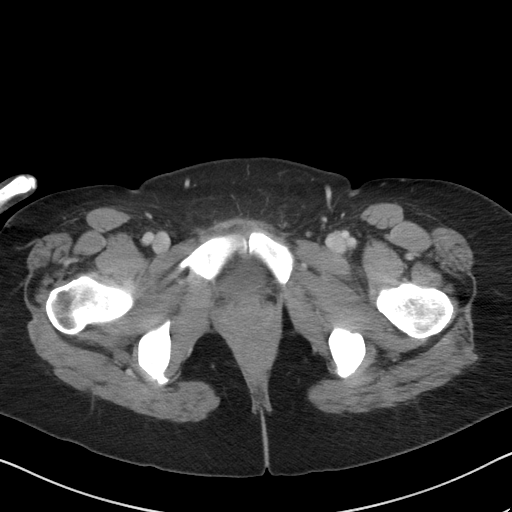
[im 19/97  soft-tissue]
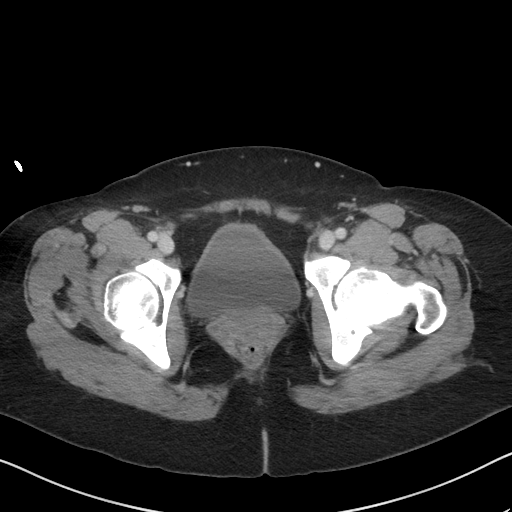
[im 28/97  soft-tissue]
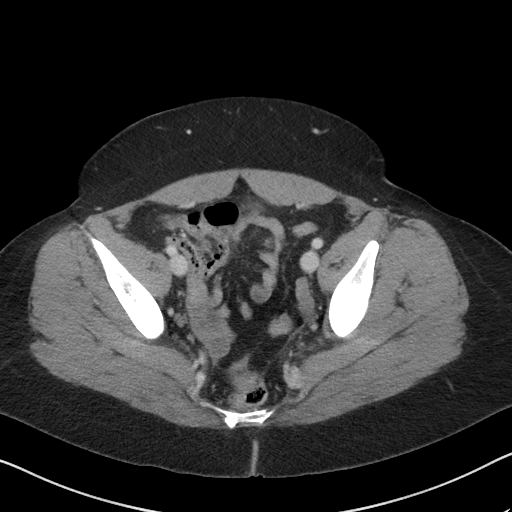
[im 33/97  soft-tissue]
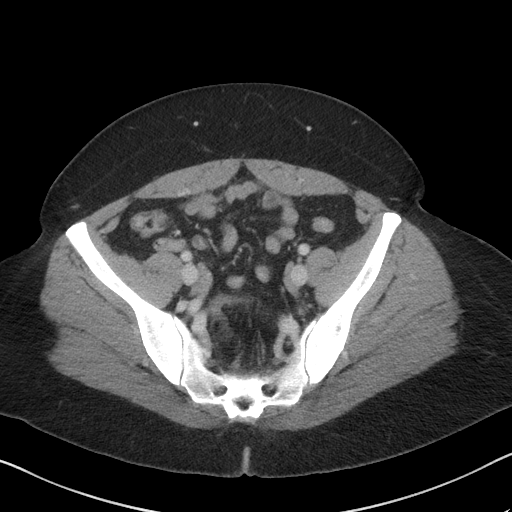
[im 37/97  soft-tissue]
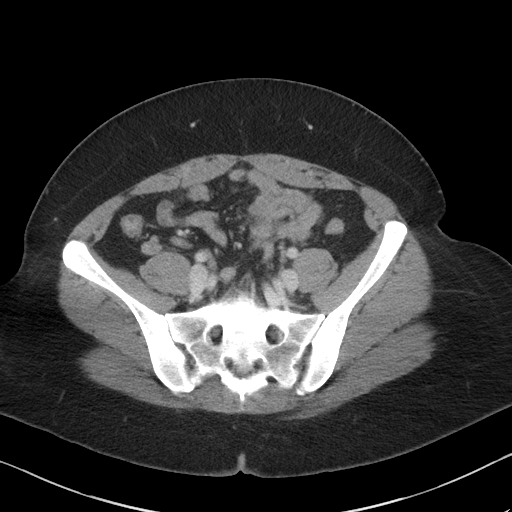
[im 46/97  soft-tissue]
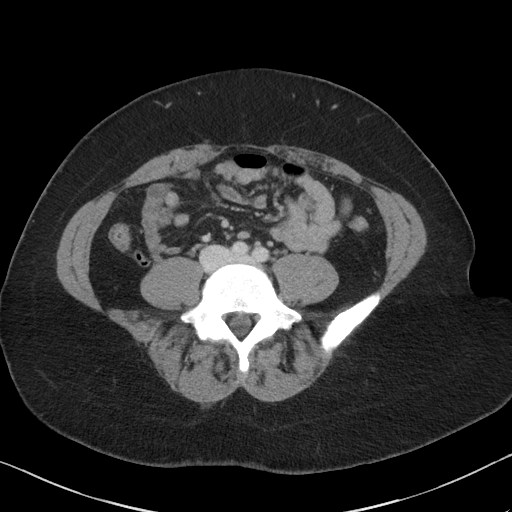
[im 51/97  soft-tissue]
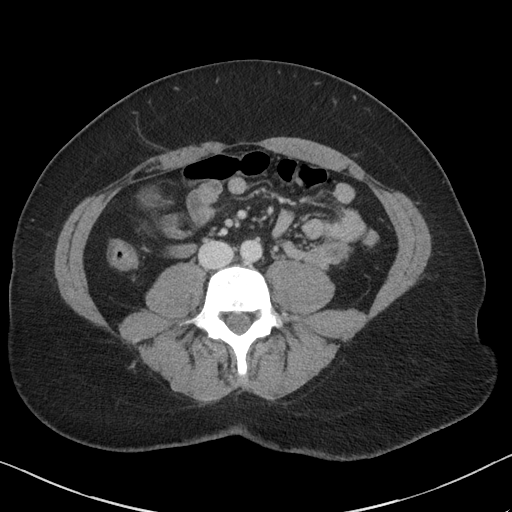
[im 60/97  soft-tissue]
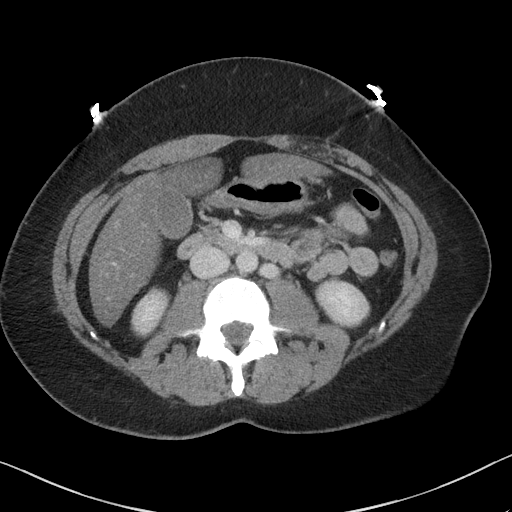
[im 60/97  bone]
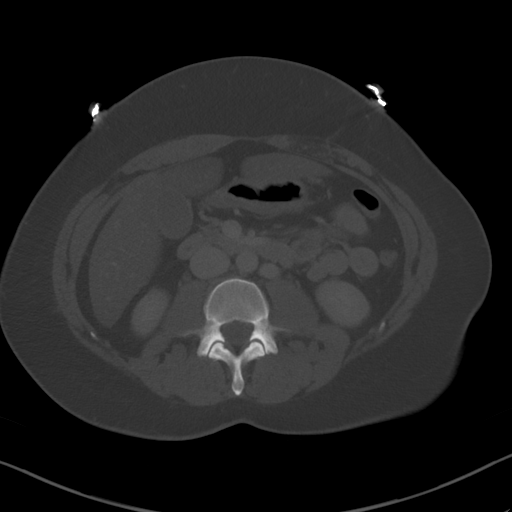
[im 65/97  soft-tissue]
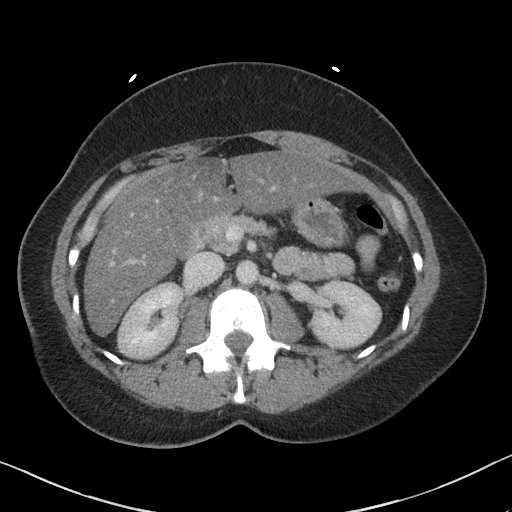
[im 74/97  soft-tissue]
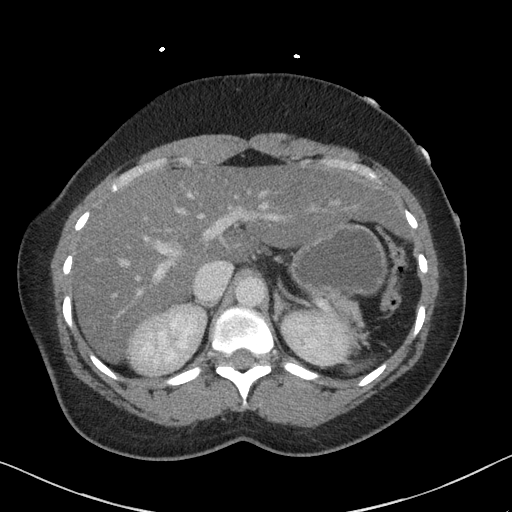
[im 78/97  soft-tissue]
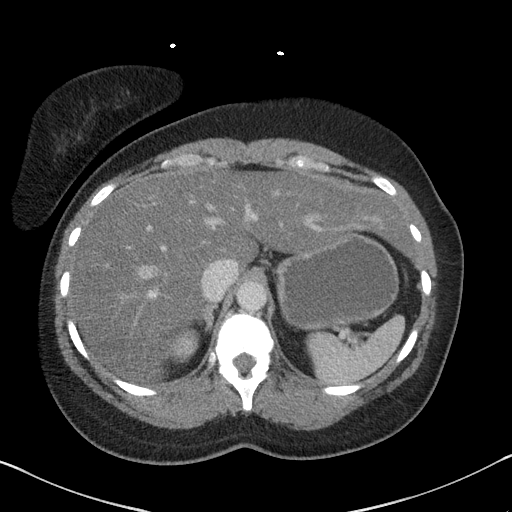
[im 83/97  soft-tissue]
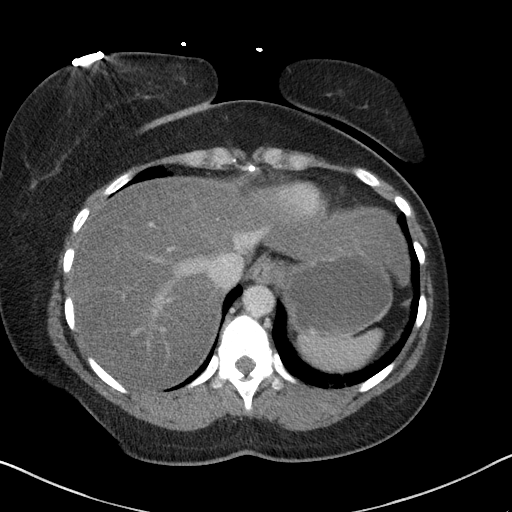
[im 92/97  soft-tissue]
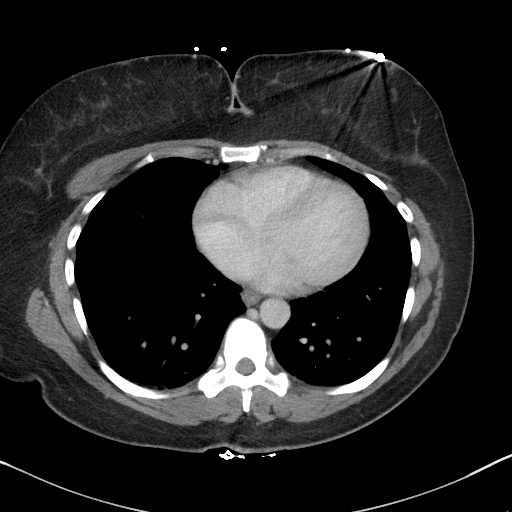

[Series 6: a/p w/ cor · coronal · 1.00mm/px · 3 of 127 slices shown]
[im 43/127  soft-tissue]
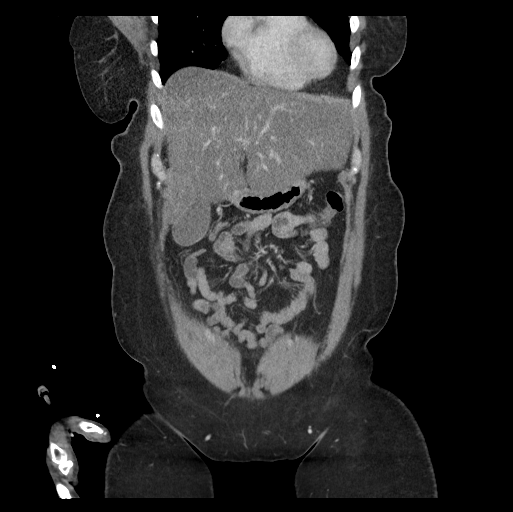
[im 57/127  soft-tissue]
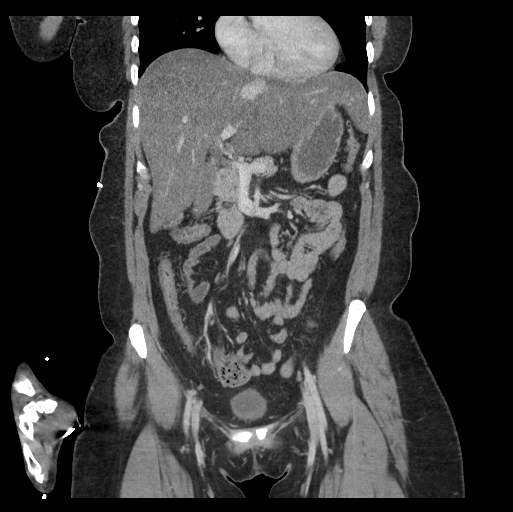
[im 71/127  soft-tissue]
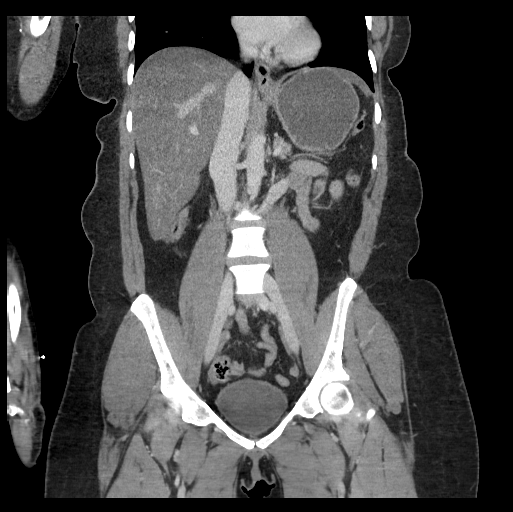

[17 of 46 positions shown; findings below may reference images not displayed]

RADIATION DOSE REDUCTION: This exam was performed according to the
departmental dose-optimization program which includes automated
exposure control, adjustment of the mA and/or kV according to
patient size and/or use of iterative reconstruction technique.

CONTRAST:  100mL OMNIPAQUE IOHEXOL 300 MG/ML  SOLN
FINDINGS: Lower chest: Bibasilar atelectasis.

Hepatobiliary: Diffuse hepatic steatosis with regional decreased
radiodensity adjacent to the falciform ligament likely reflecting an
area of even greater focal hepatic steatosis. Hypertrophy of the
LEFT liver. Gallbladder is distended without evidence of acute
cholecystitis.

Pancreas: Unremarkable. No pancreatic ductal dilatation or
surrounding inflammatory changes.

Spleen: Normal in size without focal abnormality.

Adrenals/Urinary Tract: Adrenal glands are unremarkable. Kidneys are
normal, without renal calculi, focal lesion, or hydronephrosis.
Bladder is unremarkable.

Stomach/Bowel: Stomach is within normal limits. Appendix appears
normal. No evidence of bowel wall thickening, distention, or
inflammatory changes.

Vascular/Lymphatic: Retroaortic LEFT renal vein. No significant
vascular findings are present. No enlarged abdominal or pelvic lymph
nodes.

Reproductive: Status post hysterectomy. No adnexal masses.

Other: No free air or free fluid.

Musculoskeletal: No acute or significant osseous findings.
IMPRESSION: 1. No CT etiology for epigastric pain identified.
2. Hepatic steatosis.

## 2023-11-06 ENCOUNTER — Ambulatory Visit (INDEPENDENT_AMBULATORY_CARE_PROVIDER_SITE_OTHER): Payer: MEDICAID | Admitting: Licensed Clinical Social Worker

## 2023-11-06 DIAGNOSIS — F331 Major depressive disorder, recurrent, moderate: Secondary | ICD-10-CM

## 2023-11-06 DIAGNOSIS — F4312 Post-traumatic stress disorder, chronic: Secondary | ICD-10-CM | POA: Diagnosis not present

## 2023-11-06 DIAGNOSIS — F1020 Alcohol dependence, uncomplicated: Secondary | ICD-10-CM | POA: Diagnosis not present

## 2023-11-06 NOTE — Progress Notes (Signed)
 Virtual Visit via Video Note   I connected with Danielle Reilly on 11/06/23 at 3:00pm by video enabled telemedicine application and verified that I am speaking with the correct person using two identifiers.   I discussed the limitations, risks, security and privacy concerns of performing an evaluation and management service by video and the availability of in person appointments. I also discussed with the patient that there may be a patient responsible charge related to this service. The patient expressed understanding and agreed to proceed.   I discussed the assessment and treatment plan with the patient. The patient was provided an opportunity to ask questions and all were answered. The patient agreed with the plan and demonstrated an understanding of the instructions.   The patient was advised to call back or seek an in-person evaluation if the symptoms worsen or if the condition fails to improve as anticipated.   I provided 1 hour of non-face-to-face time during this encounter.     Danielle Ricker, LCSW, LCAS ____________________________  THERAPIST PROGRESS NOTE   Session Time:  3:00pm - 4:00pm    Location: Patient: Patient Home Provider: Home Office   Participation Level: Active    Behavioral Response: Alert, casually dressed, depressed mood/affect   Type of Therapy:  Individual Therapy   Treatment Goals addressed: Depression/anxiety management; Medication compliance; Monitoring alcohol use  Progress Towards Goals: Progressing   Interventions: CBT, relapse prevention, supportive therapy      Summary: Danielle Reilly is a 48 year old single AA female that presented for therapy appointment today with diagnosis of PTSD; Major depressive disorder, recurrent, moderate with anxious distress; Alcohol Use Disorder, moderate; and Unresolved Grief.  Suicidal/Homicidal: None; without intent or plan.     Therapist Response:  Clinician met with Danielle Reilly for virtual therapy session and assessed for  safety, sobriety, and medication compliance.  Danielle Reilly presented for appointment on time and was alert, oriented x5, with no evidence or self-report of active SI/HI or A/V H.  Danielle Reilly reported that she has been drinking alcohol almost every day.  She reported compliance with medication.  Clinician inquired about Danielle Reilly's emotional ratings today, as well as any significant changes in thoughts, feelings, or behavior since previous check-in.  Danielle Reilly reported scores of 7/10 for depression, 6/10 for anxiety, and 5/10 for anger.  Danielle Reilly denied any panic attacks or outbursts.  Clinician inquired about how much alcohol Danielle Reilly is drinking at present.  Danielle Reilly reported that she has been averaging roughly 1 bottle of wine each night, stating I'm so tired of repeating the same cycle.  I deserve to be happy.  Danielle Reilly reported that she is realizing that boredom can be a significant trigger.  Clinician utilized a handout on this subject to guide discussion.  This explained how addicts commonly struggle with boredom for a multitude of reasons, including the fact that a structured, routine life feels different from one built around substance use, brain chemical changes during recovery can increase listlessness, and lead to mood swings depending on use patterns.  Clinician encouraged Danielle Reilly to consider healthy recreational activities she could include in self-care routine to strengthen relapse prevention efforts. Clinician also recommended that Danielle Reilly consider attending outpatient substance abuse treatment groups as an option for filling this time, building skills, and sober support.  Clinician offered to assist Danielle Reilly with referrals for these resources.  Intervention was effective, as evidence by Danielle Reilly acknowledging that drinking alcohol does not make her happy, and is only creating more problems, including complications with her physical health such  as managing diabetes.  She reported that there are several healthier self-care activities she  could spend her free time on when feeling bored, such as attending dance classes, or taking walks with sober family members like her nephew.  She reported that she is hesitant to commit to a substance abuse program at the moment due to her work schedule, but she will consider it between now and our next session.  Danielle Reilly reported that she may participate in local AA meetings instead, as there is one offered at a nearby church.  Danielle Reilly stated "I appreciate you.  I'm glad I have a therapist that doesn't scold me, but still holds me accountable".  Clinician will continue to monitor.                     Plan: Follow up in 1 week.   Diagnosis: PTSD; Major depressive disorder, recurrent, moderate with anxious distress; Alcohol Use Disorder, moderate; and Unresolved Grief.  Collaboration of Care:   None required at this time.                                                   Patient/Guardian was advised Release of Information must be obtained prior to any record release in order to collaborate their care with an outside provider. Patient/Guardian was advised if they have not already done so to contact the registration department to sign all necessary forms in order for us  to release information regarding their care.    Consent: Patient/Guardian gives verbal consent for treatment and assignment of benefits for services provided during this visit. Patient/Guardian expressed understanding and agreed to proceed.   Danielle Ricker, LCSW, LCAS 11/06/23

## 2023-11-20 ENCOUNTER — Ambulatory Visit (INDEPENDENT_AMBULATORY_CARE_PROVIDER_SITE_OTHER): Payer: MEDICAID | Admitting: Licensed Clinical Social Worker

## 2023-11-20 DIAGNOSIS — F331 Major depressive disorder, recurrent, moderate: Secondary | ICD-10-CM | POA: Diagnosis not present

## 2023-11-20 DIAGNOSIS — F1020 Alcohol dependence, uncomplicated: Secondary | ICD-10-CM | POA: Diagnosis not present

## 2023-11-20 DIAGNOSIS — F4312 Post-traumatic stress disorder, chronic: Secondary | ICD-10-CM | POA: Diagnosis not present

## 2023-11-20 NOTE — Progress Notes (Signed)
 Virtual Visit via Video Note   I connected with Danielle Reilly on 11/20/23 at 3:00pm by video enabled telemedicine application and verified that I am speaking with the correct person using two identifiers.   I discussed the limitations, risks, security and privacy concerns of performing an evaluation and management service by video and the availability of in person appointments. I also discussed with the patient that there may be a patient responsible charge related to this service. The patient expressed understanding and agreed to proceed.   I discussed the assessment and treatment plan with the patient. The patient was provided an opportunity to ask questions and all were answered. The patient agreed with the plan and demonstrated an understanding of the instructions.   The patient was advised to call back or seek an in-person evaluation if the symptoms worsen or if the condition fails to improve as anticipated.   I provided 1 hour of non-face-to-face time during this encounter.     Danielle Ricker, LCSW, LCAS ____________________________  THERAPIST PROGRESS NOTE   Session Time:  3:00pm - 4:00pm     Location: Patient: Patient Home Provider: [facility]   Participation Level: Active    Behavioral Response: Alert, casually dressed, anxious mood/affect   Type of Therapy:  Individual Therapy   Treatment Goals addressed: Depression/anxiety management; Medication compliance; Monitoring alcohol use; Exercising regularly; Improving sleep quality; Safety planning   Progress Towards Goals: Progressing   Interventions: CBT, treatment planning    Summary: Danielle Reilly is a 48 year old single AA female that presented for therapy appointment today with diagnosis of PTSD; Major depressive disorder, recurrent, moderate with anxious distress; Alcohol Use Disorder, moderate; and Unresolved Grief.  Suicidal/Homicidal: None; without intent or plan.     Therapist Response:  Clinician met with Danielle Reilly for  virtual therapy appointment and assessed for safety, sobriety, and medication compliance.  Danielle Reilly presented for session on time and was alert, oriented x5, with no evidence or self-report of active SI/HI or A/V H.  Danielle Reilly reported that she drank once the weekend after our last session, limiting herself to 2 wine coolers.  She reported compliance with medication.  Clinician inquired about Danielle Reilly's current emotional ratings, as well as any significant changes in thoughts, feelings, or behavior since last check-in.  Danielle Reilly reported scores of 3/10 for depression, 4/10 for anxiety, and 0/10 for anger/irritability.  Danielle Reilly denied any panic attacks or outbursts.  Danielle Reilly reported that a recent success has been trying to improve her self-care routine in order to stay sober, including going to dance classes, getting more exercises, spending time with family, and going to church.  Clinician praised Danielle Reilly for positive changes made in routine and abstinence efforts.  Clinician suggested revisiting treatment plan today to identify additional progress made toward goals in past months, and any current barriers to success. Danielle Reilly was agreeable to this, so clinician collaborated with her to make updates as follows with her verbal consent: Meet with clinician x1 per week for therapy sessions to update on goal progress and address any needs that arise; Follow up with psychiatrist every 2-3 months regarding efficacy of medication and any dose modification necessary; Take medications daily as prescribed to aid in symptom reduction and improvement of daily functioning; Reduce depression severity from average of 3/10 most days down to 1/10 over the next 90 days by attending regular therapy sessions, and engaging in healthy self-care activities 1 hour per day to keep mind engaged such as coloring, dancing, and kickboxing; Maintain average  anxiety level at 0-1/10 in severity over next 90 days by practicing relaxation techniques with proven efficacy  2-3x daily, in addition to challenging anxious thoughts that arise to negate negative impact on outlook; Exercise for at least 1 hour, x2-3 per week in addition to following healthy diet to improve both physical and mental well-being per PCP recommendations; Implement 4-5x sleep hygiene techniques in order to increase average nightly rest to 8 hours and reduce related fatigue and irritability upon waking over next 90 days; Consider accepting referral for CCTP approved therapist for treatment of PTSD symptoms if they continue to negatively impact daily functioning over next 90 days; Outreach [facility] for individual and group grief counseling services within next 90 days in order to assist with processing loss of parents; Attend church sessions x1 per month to stay engaged with supportive community, and maintain spiritual support; Continue to carefully monitor alcohol use during treatment to avoid falling back on this substance to numb problems and risk return to prior abuse patterns and related consequences; Maintain healthier boundaries with all supports to avoid worsening anxiety and depression, as well as enforce assertive communication skills, with goal of limiting contact with toxic supports until more respectful communication patterns are established; Voluntarily seek admission to hospital for crisis intervention should SI/HI or A/V H appear and put safety of self or others at risk.  Progress is evidenced by Danielle Reilly expressing motivation to stay consistent with therapy, take medication as prescribed, and noting reduction in depression and severity levels.  Danielle Reilly reported that she has also been spending more time on self-care activities, including exercising more often.  Danielle Reilly has shown improvement in sobriety efforts following relapse event last month, and is now trying to expand positive support, including attendance to church.  Danielle Reilly denied interest in pursuing grief counseling or trauma therapy  until she is more stable with drinking habit.  Danielle Reilly would also benefit from setting healthier boundaries with triggering family members like her sister.  Haadiya stated I know I have a long way to go, but I am happy with what I've been able to accomplish.  Clinician will continue to monitor.                     Plan: Follow up in 1 week.   Diagnosis: PTSD; Major depressive disorder, recurrent, moderate with anxious distress; Alcohol Use Disorder, moderate; and Unresolved Grief.  Collaboration of Care:   None required at this time.                                                   Patient/Guardian was advised Release of Information must be obtained prior to any record release in order to collaborate their care with an outside provider. Patient/Guardian was advised if they have not already done so to contact the registration department to sign all necessary forms in order for us  to release information regarding their care.    Consent: Patient/Guardian gives verbal consent for treatment and assignment of benefits for services provided during this visit. Patient/Guardian expressed understanding and agreed to proceed.   Danielle Ricker, LCSW, LCAS 11/20/23

## 2023-11-29 ENCOUNTER — Telehealth (HOSPITAL_BASED_OUTPATIENT_CLINIC_OR_DEPARTMENT_OTHER): Payer: MEDICAID | Admitting: Psychiatry

## 2023-11-29 ENCOUNTER — Encounter (HOSPITAL_COMMUNITY): Payer: Self-pay | Admitting: Psychiatry

## 2023-11-29 VITALS — Wt 179.0 lb

## 2023-11-29 DIAGNOSIS — F10188 Alcohol abuse with other alcohol-induced disorder: Secondary | ICD-10-CM | POA: Diagnosis not present

## 2023-11-29 DIAGNOSIS — Z62819 Personal history of unspecified abuse in childhood: Secondary | ICD-10-CM

## 2023-11-29 DIAGNOSIS — F4312 Post-traumatic stress disorder, chronic: Secondary | ICD-10-CM | POA: Diagnosis not present

## 2023-11-29 DIAGNOSIS — F322 Major depressive disorder, single episode, severe without psychotic features: Secondary | ICD-10-CM

## 2023-11-29 MED ORDER — TRAZODONE HCL 50 MG PO TABS: 50.0000 mg | ORAL_TABLET | Freq: Every day | ORAL | 1 refills | Status: DC

## 2023-11-29 MED ORDER — FLUOXETINE HCL 20 MG PO CAPS: 20.0000 mg | ORAL_CAPSULE | Freq: Every day | ORAL | 1 refills | Status: DC

## 2023-11-29 MED ORDER — NALTREXONE HCL 50 MG PO TABS: 50.0000 mg | ORAL_TABLET | Freq: Every day | ORAL | 1 refills | Status: DC
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
## 2023-12-03 ENCOUNTER — Other Ambulatory Visit: Payer: Self-pay | Admitting: Emergency Medicine

## 2023-12-04 ENCOUNTER — Ambulatory Visit (INDEPENDENT_AMBULATORY_CARE_PROVIDER_SITE_OTHER): Payer: MEDICAID | Admitting: Licensed Clinical Social Worker

## 2023-12-04 DIAGNOSIS — F4321 Adjustment disorder with depressed mood: Secondary | ICD-10-CM

## 2023-12-04 DIAGNOSIS — F4312 Post-traumatic stress disorder, chronic: Secondary | ICD-10-CM

## 2023-12-04 DIAGNOSIS — F331 Major depressive disorder, recurrent, moderate: Secondary | ICD-10-CM

## 2023-12-04 DIAGNOSIS — F1020 Alcohol dependence, uncomplicated: Secondary | ICD-10-CM | POA: Diagnosis not present

## 2023-12-04 NOTE — Progress Notes (Signed)
 Virtual Visit via Video Note   I connected with Danielle Reilly on 12/04/23 at 2:00pm by video enabled telemedicine application and verified that I am speaking with the correct person using two identifiers.   I discussed the limitations, risks, security and privacy concerns of performing an evaluation and management service by video and the availability of in person appointments. I also discussed with the patient that there may be a patient responsible charge related to this service. The patient expressed understanding and agreed to proceed.   I discussed the assessment and treatment plan with the patient. The patient was provided an opportunity to ask questions and all were answered. The patient agreed with the plan and demonstrated an understanding of the instructions.   The patient was advised to call back or seek an in-person evaluation if the symptoms worsen or if the condition fails to improve as anticipated.   I provided 31 minutes of non-face-to-face time during this encounter.     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  Suicidal/Homicidal: None; without intent or plan.     Therapist Response:  Clinician met with Danielle Reilly for virtual therapy session and assessed for safety, sobriety,  and medication compliance.  Danielle Reilly presented for appointment on time and was alert, oriented x5, with no evidence or self-report of active SI/HI or A/V H.  Danielle Reilly reported that she drank alcohol one time since the previous session, and had 2 wine coolers.  She reported compliance with medication.  Clinician inquired about Danielle Reilly's emotional ratings today, as well as any significant changes in thoughts, feelings, or behavior since previous check-in.  Danielle Reilly reported scores of 3/10 for depression, 5/10 for anxiety, and 0/10 for anger/irritability.  Danielle Reilly denied any panic attacks or outbursts.  Danielle Reilly reported that a recent success has been increasing efforts to stabilize her recovery from alcohol abuse, including reading the 'big book', and attending 1-2 virtual meetings.  She reported that she is also realizing that she doesn't receive any benefits from drinking now, and feels worse after drinking events.  Danielle Reilly reported that she is also considering going to in person meetings and finding a home group.  Clinician praised Danielle Reilly for increased recovery efforts and encouraged her to stay connected with sober community as she strengthens network.  Clinician introduced topic of self-care today.  Clinician explained how this can be defined as the things one does to maintain good health and improve well-being.  Clinician provided Danielle Reilly with a self-care assessment form to complete.  This handout featured various sub-categories of self-care, including physical, psychological/emotional, social, spiritual, and professional.  Danielle Reilly was asked to rank her engagement in the activities listed for each dimension on a scale of 1-3, with 1 indicating 'Poor', 2 indicating 'Ok', and 3 indicating 'Well'.  Clinician invited Danielle Reilly to share results of her assessment, and inquired about which areas of self-care she is doing well in, as well  as areas that require attention, and how she plans to begin addressing this during treatment.  Intervention  was effective, as evidenced by Danielle Reilly successfully completing initial (physical) section of assessment and actively engaging in discussion on subject, reporting that she is excelling in areas such as taking care of personal hygiene, eating regularly, and attending medical appointments, but would benefit from focusing more on areas such as eating healthy foods, exercise, wearing clothes that make her feel good about herself, participating in fun activities, and getting enough sleep.  Danielle Reilly reported that she would work to improve self-care deficits by adjusting diet to include more fruits, nuts, and vegetables, following a more consistent workout routine each week, wearing nice outfits more often to boost self-image, attending weekly dance classes, and implementing sleep hygiene changes to get more rest at night, including taking medicine from doctor targeting this issues.  Danielle Reilly stated "There is room for improvement".  Clinician will continue to monitor.                     Plan: Follow up in 1 week.   Diagnosis: PTSD; Major depressive disorder, recurrent, moderate with anxious distress; Alcohol Use Disorder, moderate; and Unresolved Grief.  Collaboration of Care:   None required at this time.                                                   Patient/Guardian was advised Release of Information must be obtained prior to any record release in order to collaborate their care with an outside provider. Patient/Guardian was advised if they have not already done so to contact the registration department to sign all necessary forms in order for us  to release information regarding their care.    Consent: Patient/Guardian gives verbal consent for treatment and assignment of benefits for services provided during this visit. Patient/Guardian expressed understanding and agreed to proceed.   [name], LCSW, LCAS 12/04/23

## 2023-12-18 ENCOUNTER — Ambulatory Visit (HOSPITAL_COMMUNITY): Payer: MEDICAID | Admitting: Licensed Clinical Social Worker

## 2023-12-18 DIAGNOSIS — F1020 Alcohol dependence, uncomplicated: Secondary | ICD-10-CM | POA: Diagnosis not present

## 2023-12-18 DIAGNOSIS — F4321 Adjustment disorder with depressed mood: Secondary | ICD-10-CM

## 2023-12-18 DIAGNOSIS — F331 Major depressive disorder, recurrent, moderate: Secondary | ICD-10-CM | POA: Diagnosis not present

## 2023-12-18 DIAGNOSIS — F4312 Post-traumatic stress disorder, chronic: Secondary | ICD-10-CM

## 2023-12-18 NOTE — Progress Notes (Signed)
 Virtual Visit via Video Note   I connected with Yulianna R. Brashears on 12/18/23 at 3:00pm by video enabled telemedicine application and verified that I am speaking with the correct person using two identifiers.   I discussed the limitations, risks, security and privacy concerns of performing an evaluation and management service by video and the availability of in person appointments. I also discussed with the patient that there may be a patient responsible charge related to this service. The patient expressed understanding and agreed to proceed.   I discussed the assessment and treatment plan with the patient. The patient was provided an opportunity to ask questions and all were answered. The patient agreed with the plan and demonstrated an understanding of the instructions.   The patient was advised to call back or seek an in-person evaluation if the symptoms worsen or if the condition fails to improve as anticipated.   I provided 30 minutes of non-face-to-face time during this encounter.     [name], LCSW, LCAS ______________________________  THERAPIST PROGRESS NOTE   Session Time:  3:00pm - 3:30pm   Location: Patient: Patient home  Provider: [facility]   Participation Level: Active    Behavioral Response: Alert, casually dressed, euthymic mood/affect   Type of Therapy:  Individual Therapy   Treatment Goals addressed: Depression/anxiety management; Medication compliance; Monitoring alcohol use  Progress Towards Goals: Progressing   Interventions: CBT: gratitude journaling    Summary: Ilo Beamon is a 48 year old single AA female that presented for therapy appointment today with diagnosis of PTSD; Major depressive disorder, recurrent, moderate with anxious distress; Alcohol Use Disorder, moderate; and Unresolved Grief.  Suicidal/Homicidal: None; without intent or plan.     Therapist Response:  Clinician met with Tayllor for virtual therapy appointment and assessed for safety,  sobriety, and medication compliance.  Kynnadi presented for session on time and was alert, oriented x5, with no evidence or self-report of active SI/HI or A/V H.  Yurianna reported that she has abstained from alcohol since the last time with met, stating Its not even fun anymore.  She reported compliance with medication.  Clinician inquired about Chidera's current emotional ratings, as well as any significant changes in thoughts, feelings, or behavior since last check-in.  Taquilla reported scores of 2/10 for depression, 3/10 for anxiety, and 0/10 for anger/irritability.  Sacoya denied any panic attacks or outbursts.  Sanah reported that a recent struggle has been recovering from an ear infection, which required antibiotics to cope with.  She reported that a success has been maintaining abstinence and trying to develop healthier daily habits to support this lifestyle change, stating My goal is not to relapse again.  I'm working on myself daily.  Clinician discussed topic of gratitude journaling with Ileta as a form of self-care.  Clinician virtually shared a handout with her today which explained the benefits of this practice, including reduction in stress, increased happiness, and self-esteem.  Tips were also provided to aid in practice, such as taking time with entries, writing about people one is grateful for, and setting goal for two entries per week for at least 10-20 minutes at a time.  Clinician also provided Kaoir with a variety of journaling prompts to choose from today, and encouraged her to take time to write about something she is grateful for, with examples such as "Something beautiful I recently saw was..", "Something I can be proud of is.", "A reason to be excited for the future is." and more.  Lily was  encouraged to share her entries aloud, along with her perspective on the activity and motivation level towards making this a habit. Intervention was effective, as evidenced by [employer] participating in  journaling activity, and choosing the prompt "Someone who I admire is".  Rome expressed gratitude for her son, reporting that she sees many positive qualities in him that have been inspiring in her recovery journey, including the fact that he is self-driven, motivated, honest, caring, and protective of her.  Americus reported that he has not been afraid to speak up when her drinking became problematic and this has helped provide reinforcement for sobriety.  Roxy asked to end session at halfway point in order to start work shift on time.  Clinician will continue to monitor.                     Plan: Follow up in 1 week.   Diagnosis: PTSD; Major depressive disorder, recurrent, moderate with anxious distress; Alcohol Use Disorder, moderate; and Unresolved Grief.  Collaboration of Care:   None required at this time.                                                   Patient/Guardian was advised Release of Information must be obtained prior to any record release in order to collaborate their care with an outside provider. Patient/Guardian was advised if they have not already done so to contact the registration department to sign all necessary forms in order for us  to release information regarding their care.    Consent: Patient/Guardian gives verbal consent for treatment and assignment of benefits for services provided during this visit. Patient/Guardian expressed understanding and agreed to proceed.   [name], LCSW, LCAS 12/18/23

## 2023-12-30 ENCOUNTER — Inpatient Hospital Stay (HOSPITAL_COMMUNITY)
Admission: EM | Admit: 2023-12-30 | Discharge: 2024-01-04 | DRG: 381 | Disposition: A | Discharge status: Home / Self Care | Payer: MEDICAID | Attending: Internal Medicine | Admitting: Internal Medicine

## 2023-12-30 ENCOUNTER — Emergency Department (HOSPITAL_COMMUNITY): Payer: MEDICAID

## 2023-12-30 ENCOUNTER — Encounter (HOSPITAL_COMMUNITY): Payer: Self-pay

## 2023-12-30 ENCOUNTER — Other Ambulatory Visit: Payer: Self-pay

## 2023-12-30 DIAGNOSIS — M6282 Rhabdomyolysis: Secondary | ICD-10-CM | POA: Diagnosis present

## 2023-12-30 DIAGNOSIS — F10231 Alcohol dependence with withdrawal delirium: Secondary | ICD-10-CM | POA: Diagnosis not present

## 2023-12-30 DIAGNOSIS — K3189 Other diseases of stomach and duodenum: Secondary | ICD-10-CM | POA: Diagnosis not present

## 2023-12-30 DIAGNOSIS — K2100 Gastro-esophageal reflux disease with esophagitis, without bleeding: Secondary | ICD-10-CM | POA: Diagnosis present

## 2023-12-30 DIAGNOSIS — E8729 Other acidosis: Secondary | ICD-10-CM | POA: Diagnosis not present

## 2023-12-30 DIAGNOSIS — K219 Gastro-esophageal reflux disease without esophagitis: Secondary | ICD-10-CM | POA: Diagnosis present

## 2023-12-30 DIAGNOSIS — J029 Acute pharyngitis, unspecified: Secondary | ICD-10-CM

## 2023-12-30 DIAGNOSIS — H68102 Unspecified obstruction of Eustachian tube, left ear: Secondary | ICD-10-CM | POA: Diagnosis not present

## 2023-12-30 DIAGNOSIS — Z560 Unemployment, unspecified: Secondary | ICD-10-CM

## 2023-12-30 DIAGNOSIS — R079 Chest pain, unspecified: Secondary | ICD-10-CM | POA: Diagnosis present

## 2023-12-30 DIAGNOSIS — E876 Hypokalemia: Secondary | ICD-10-CM | POA: Diagnosis present

## 2023-12-30 DIAGNOSIS — I2489 Other forms of acute ischemic heart disease: Secondary | ICD-10-CM | POA: Diagnosis present

## 2023-12-30 DIAGNOSIS — Z79899 Other long term (current) drug therapy: Secondary | ICD-10-CM | POA: Diagnosis not present

## 2023-12-30 DIAGNOSIS — R1314 Dysphagia, pharyngoesophageal phase: Secondary | ICD-10-CM | POA: Diagnosis present

## 2023-12-30 DIAGNOSIS — R630 Anorexia: Secondary | ICD-10-CM | POA: Diagnosis present

## 2023-12-30 DIAGNOSIS — F32A Depression, unspecified: Secondary | ICD-10-CM | POA: Diagnosis present

## 2023-12-30 DIAGNOSIS — Z860100 Personal history of colon polyps, unspecified: Secondary | ICD-10-CM

## 2023-12-30 DIAGNOSIS — R000 Tachycardia, unspecified: Secondary | ICD-10-CM | POA: Diagnosis present

## 2023-12-30 DIAGNOSIS — D72829 Elevated white blood cell count, unspecified: Secondary | ICD-10-CM | POA: Diagnosis present

## 2023-12-30 DIAGNOSIS — D6489 Other specified anemias: Secondary | ICD-10-CM | POA: Diagnosis present

## 2023-12-30 DIAGNOSIS — R10811 Right upper quadrant abdominal tenderness: Secondary | ICD-10-CM | POA: Diagnosis not present

## 2023-12-30 DIAGNOSIS — K2950 Unspecified chronic gastritis without bleeding: Secondary | ICD-10-CM | POA: Diagnosis present

## 2023-12-30 DIAGNOSIS — R112 Nausea with vomiting, unspecified: Secondary | ICD-10-CM | POA: Diagnosis present

## 2023-12-30 DIAGNOSIS — R634 Abnormal weight loss: Secondary | ICD-10-CM | POA: Diagnosis present

## 2023-12-30 DIAGNOSIS — F418 Other specified anxiety disorders: Secondary | ICD-10-CM | POA: Diagnosis not present

## 2023-12-30 DIAGNOSIS — F419 Anxiety disorder, unspecified: Secondary | ICD-10-CM | POA: Diagnosis present

## 2023-12-30 DIAGNOSIS — I959 Hypotension, unspecified: Secondary | ICD-10-CM | POA: Diagnosis present

## 2023-12-30 DIAGNOSIS — K760 Fatty (change of) liver, not elsewhere classified: Secondary | ICD-10-CM | POA: Diagnosis present

## 2023-12-30 DIAGNOSIS — E872 Acidosis, unspecified: Secondary | ICD-10-CM | POA: Diagnosis present

## 2023-12-30 DIAGNOSIS — K2210 Ulcer of esophagus without bleeding: Secondary | ICD-10-CM | POA: Diagnosis present

## 2023-12-30 DIAGNOSIS — E0781 Sick-euthyroid syndrome: Secondary | ICD-10-CM | POA: Diagnosis present

## 2023-12-30 DIAGNOSIS — E860 Dehydration: Secondary | ICD-10-CM | POA: Diagnosis present

## 2023-12-30 DIAGNOSIS — R1110 Vomiting, unspecified: Secondary | ICD-10-CM | POA: Diagnosis not present

## 2023-12-30 DIAGNOSIS — K449 Diaphragmatic hernia without obstruction or gangrene: Secondary | ICD-10-CM | POA: Diagnosis present

## 2023-12-30 DIAGNOSIS — N179 Acute kidney failure, unspecified: Secondary | ICD-10-CM | POA: Diagnosis present

## 2023-12-30 DIAGNOSIS — R1084 Generalized abdominal pain: Secondary | ICD-10-CM | POA: Diagnosis not present

## 2023-12-30 DIAGNOSIS — F10188 Alcohol abuse with other alcohol-induced disorder: Secondary | ICD-10-CM | POA: Diagnosis not present

## 2023-12-30 DIAGNOSIS — Z6827 Body mass index (BMI) 27.0-27.9, adult: Secondary | ICD-10-CM

## 2023-12-30 DIAGNOSIS — D6959 Other secondary thrombocytopenia: Secondary | ICD-10-CM | POA: Diagnosis present

## 2023-12-30 DIAGNOSIS — R1310 Dysphagia, unspecified: Secondary | ICD-10-CM | POA: Diagnosis not present

## 2023-12-30 DIAGNOSIS — K2960 Other gastritis without bleeding: Secondary | ICD-10-CM | POA: Diagnosis present

## 2023-12-30 DIAGNOSIS — Z90710 Acquired absence of both cervix and uterus: Secondary | ICD-10-CM | POA: Diagnosis not present

## 2023-12-30 LAB — CBC
HCT: 38.6 % (ref 36.0–46.0)
Hemoglobin: 12.7 g/dL (ref 12.0–15.0)
MCH: 28 pg (ref 26.0–34.0)
MCHC: 32.9 g/dL (ref 30.0–36.0)
MCV: 85.2 fL (ref 80.0–100.0)
Platelets: 178 K/uL (ref 150–400)
RBC: 4.53 MIL/uL (ref 3.87–5.11)
RDW: 14.6 % (ref 11.5–15.5)
WBC: 10.8 K/uL — ABNORMAL HIGH (ref 4.0–10.5)
nRBC: 0.2 % (ref 0.0–0.2)

## 2023-12-30 LAB — COMPREHENSIVE METABOLIC PANEL WITH GFR
ALT: 35 U/L (ref 0–44)
AST: 78 U/L — ABNORMAL HIGH (ref 15–41)
Albumin: 4.8 g/dL (ref 3.5–5.0)
Alkaline Phosphatase: 73 U/L (ref 38–126)
Anion gap: 27 — ABNORMAL HIGH (ref 5–15)
BUN: 6 mg/dL (ref 6–20)
CO2: 14 mmol/L — ABNORMAL LOW (ref 22–32)
Calcium: 10 mg/dL (ref 8.9–10.3)
Chloride: 92 mmol/L — ABNORMAL LOW (ref 98–111)
Creatinine, Ser: 1.49 mg/dL — ABNORMAL HIGH (ref 0.44–1.00)
GFR, Estimated: 43 mL/min — ABNORMAL LOW (ref >60)
Glucose, Bld: 114 mg/dL — ABNORMAL HIGH (ref 70–99)
Potassium: 3.3 mmol/L — ABNORMAL LOW (ref 3.5–5.1)
Sodium: 133 mmol/L — ABNORMAL LOW (ref 135–145)
Total Bilirubin: 2.2 mg/dL — ABNORMAL HIGH (ref 0.0–1.2)
Total Protein: 8.7 g/dL — ABNORMAL HIGH (ref 6.5–8.1)

## 2023-12-30 LAB — TROPONIN I (HIGH SENSITIVITY)
Troponin I (High Sensitivity): 22 ng/L — ABNORMAL HIGH (ref <18)
Troponin I (High Sensitivity): 24 ng/L — ABNORMAL HIGH (ref <18)

## 2023-12-30 LAB — TSH: TSH: 1.496 u[IU]/mL (ref 0.350–4.500)

## 2023-12-30 LAB — RAPID URINE DRUG SCREEN, HOSP PERFORMED
Amphetamines: NOT DETECTED
Barbiturates: NOT DETECTED
Benzodiazepines: NOT DETECTED
Cocaine: NOT DETECTED
Opiates: NOT DETECTED
Tetrahydrocannabinol: NOT DETECTED

## 2023-12-30 LAB — RESP PANEL BY RT-PCR (RSV, FLU A&B, COVID)  RVPGX2
Influenza A by PCR: NEGATIVE
Influenza B by PCR: NEGATIVE
Resp Syncytial Virus by PCR: NEGATIVE
SARS Coronavirus 2 by RT PCR: NEGATIVE

## 2023-12-30 LAB — MAGNESIUM: Magnesium: 1.7 mg/dL (ref 1.7–2.4)

## 2023-12-30 LAB — ETHANOL: Alcohol, Ethyl (B): <15 mg/dL (ref <15)

## 2023-12-30 LAB — LIPASE, BLOOD: Lipase: 31 U/L (ref 11–51)

## 2023-12-30 MED ORDER — IOHEXOL 350 MG/ML SOLN
75.0000 mL | Freq: Once | INTRAVENOUS | Status: AC | PRN
  Administered 2023-12-30: 75 mL via INTRAVENOUS

## 2023-12-30 MED ORDER — DIAZEPAM 5 MG/ML IJ SOLN
5.0000 mg | Freq: Once | INTRAMUSCULAR | Status: AC
  Administered 2023-12-30: 5 mg via INTRAVENOUS
  Filled 2023-12-30: qty 2

## 2023-12-30 MED ORDER — ONDANSETRON HCL 4 MG/2ML IJ SOLN
4.0000 mg | Freq: Once | INTRAMUSCULAR | Status: AC
Start: 2023-12-30 — End: 2023-12-30
  Administered 2023-12-30: 4 mg via INTRAVENOUS
  Filled 2023-12-30: qty 2

## 2023-12-30 MED ORDER — PANTOPRAZOLE SODIUM 40 MG IV SOLR
40.0000 mg | Freq: Two times a day (BID) | INTRAVENOUS | Status: DC
  Administered 2023-12-31 – 2024-01-04 (×10): 40 mg via INTRAVENOUS
  Filled 2023-12-30 (×10): qty 10

## 2023-12-30 MED ORDER — POTASSIUM CHLORIDE 10 MEQ/100ML IV SOLN
10.0000 meq | INTRAVENOUS | Status: AC
  Administered 2023-12-31 (×4): 10 meq via INTRAVENOUS
  Filled 2023-12-30 (×3): qty 100

## 2023-12-30 MED ORDER — ADULT MULTIVITAMIN W/MINERALS CH
1.0000 | ORAL_TABLET | Freq: Every day | ORAL | Status: DC
  Administered 2023-12-31 – 2024-01-04 (×4): 1 via ORAL
  Filled 2023-12-30 (×4): qty 1

## 2023-12-30 MED ORDER — FOLIC ACID 1 MG PO TABS
1.0000 mg | ORAL_TABLET | Freq: Once | ORAL | Status: AC
  Administered 2023-12-30: 1 mg via ORAL
  Filled 2023-12-30: qty 1

## 2023-12-30 MED ORDER — FOLIC ACID 1 MG PO TABS
1.0000 mg | ORAL_TABLET | Freq: Every day | ORAL | Status: DC
  Administered 2023-12-31 – 2024-01-04 (×4): 1 mg via ORAL
  Filled 2023-12-30 (×4): qty 1

## 2023-12-30 MED ORDER — LORAZEPAM 2 MG/ML IJ SOLN: 1.0000 mg | INTRAMUSCULAR | Status: AC | PRN

## 2023-12-30 MED ORDER — FAMOTIDINE IN NACL 20-0.9 MG/50ML-% IV SOLN
20.0000 mg | Freq: Once | INTRAVENOUS | Status: AC
  Administered 2023-12-30: 20 mg via INTRAVENOUS
  Filled 2023-12-30: qty 50

## 2023-12-30 MED ORDER — THIAMINE MONONITRATE 100 MG PO TABS
100.0000 mg | ORAL_TABLET | Freq: Every day | ORAL | Status: DC
  Administered 2023-12-31 – 2024-01-04 (×3): 100 mg via ORAL
  Filled 2023-12-30 (×3): qty 1

## 2023-12-30 MED ORDER — LACTATED RINGERS IV BOLUS
1000.0000 mL | Freq: Once | INTRAVENOUS | Status: AC
  Administered 2023-12-30: 1000 mL via INTRAVENOUS

## 2023-12-30 MED ORDER — ALUM & MAG HYDROXIDE-SIMETH 200-200-20 MG/5ML PO SUSP
30.0000 mL | Freq: Once | ORAL | Status: AC
  Administered 2023-12-30: 30 mL via ORAL
  Filled 2023-12-30: qty 30

## 2023-12-30 MED ORDER — LIDOCAINE VISCOUS HCL 2 % MT SOLN
15.0000 mL | Freq: Once | OROMUCOSAL | Status: AC
  Administered 2023-12-30: 15 mL via ORAL
  Filled 2023-12-30: qty 15

## 2023-12-30 MED ORDER — THIAMINE HCL 100 MG/ML IJ SOLN
100.0000 mg | Freq: Once | INTRAMUSCULAR | Status: AC
  Administered 2023-12-30: 100 mg via INTRAVENOUS
  Filled 2023-12-30: qty 2

## 2023-12-30 MED ORDER — POTASSIUM CHLORIDE 10 MEQ/100ML IV SOLN
10.0000 meq | Freq: Once | INTRAVENOUS | Status: AC
  Administered 2023-12-30: 10 meq via INTRAVENOUS
  Filled 2023-12-30: qty 100

## 2023-12-30 MED ORDER — LORAZEPAM 1 MG PO TABS
1.0000 mg | ORAL_TABLET | ORAL | Status: AC | PRN
  Administered 2023-12-31: 2 mg via ORAL
  Administered 2023-12-31 (×2): 1 mg via ORAL
  Administered 2023-12-31: 2 mg via ORAL
  Filled 2023-12-30: qty 2
  Filled 2023-12-30 (×2): qty 1
  Filled 2023-12-30: qty 4

## 2023-12-30 MED ORDER — THIAMINE HCL 100 MG/ML IJ SOLN
100.0000 mg | Freq: Every day | INTRAMUSCULAR | Status: DC
  Administered 2024-01-01 – 2024-01-02 (×2): 100 mg via INTRAVENOUS
  Filled 2023-12-30 (×2): qty 2

## 2023-12-30 NOTE — ED Notes (Addendum)
 Pt ambulated to restroom without assistance. Upon placing patient back on monitor after ambulating, pt heart rate in 120s-130s.

## 2023-12-30 NOTE — Assessment & Plan Note (Signed)
 In the setting of nausea vomiting order Protonix  Given slight elevated troponin Borderline T wave abnormality will order echogram and monitor on telemetry

## 2023-12-30 NOTE — Assessment & Plan Note (Signed)
-    evidence of acute renal failure due to presence of following: Cr increased >0.3 from baseline   likely secondary to dehydration,       check FeNA       Rehydrate with IV fluids        History does not suggest urinary retention or obstruction

## 2023-12-30 NOTE — Assessment & Plan Note (Signed)
 Will rehydrate  Follow fluid status

## 2023-12-30 NOTE — Assessment & Plan Note (Signed)
 Protonix  orderd

## 2023-12-30 NOTE — Assessment & Plan Note (Signed)
-   will replace electrolytes and repeat  check Mg, phos and Ca level and replace as needed Monitor on telemetry   Lab Results  Component Value Date   K 3.3 (L) 12/30/2023     Lab Results  Component Value Date   CREATININE 1.49 (H) 12/30/2023   Lab Results  Component Value Date   MG 1.7 12/30/2023   Lab Results  Component Value Date   CALCIUM 10.0 12/30/2023   PHOS 2.8 06/17/2019

## 2023-12-30 NOTE — ED Provider Notes (Signed)
 Danielle Reilly AT [facility] Provider Note HPI Danielle Reilly is a 47 y.o. female with a history of pancreatitis and alcohol use who is presenting to the emergency [facility] for multiple complaints.  Patient states that she just got off of heavy alcohol drinking.  She was drinking approximately a fifth of hard liquor per day for 7 days.  Prior to this, she had a drink in many weeks.  She has been in withdrawal before but has never had withdrawal seizures.  She thinks there is a component of withdrawal going on but has also been having 2 days of generalized abdominal pain, nausea, vomiting and exertional chest pain.  She is also complaining of sore throat and that she is burping a lot.  She has not been able to tolerate p.o. intake over the last few days.  She has had no fevers.  She has no cardiac history.  Past Medical History:  Diagnosis Date   Acute pancreatitis    Allergy    SEASONAL   Anemia    Anxiety    A LITTLE   Depression    Gastritis    GERD (gastroesophageal reflux disease)    tums as needed   Hemoperitoneum 12/25/2010   Nausea and vomiting 06/17/2019   Substance abuse (HCC)    ETOH ABUSE   SVD (spontaneous vaginal delivery)    x 1   Toothache 01/29/2013   Past Surgical History:  Procedure Laterality Date   BILATERAL SALPINGECTOMY N/A 06/19/2013   Procedure: BILATERAL SALPINGECTOMY;  Surgeon: Elveria Mungo, MD;  Location: [facility];  Service: Gynecology;  Laterality: N/A;   DILITATION & CURRETTAGE/HYSTROSCOPY WITH NOVASURE ABLATION N/A 04/04/2013   Procedure: DILATATION & CURETTAGE/HYSTEROSCOPY WITH Attempted NOVASURE ABLATION ;  Surgeon: Elveria Mungo, MD;  Location: [facility];  Service: Gynecology;  Laterality: N/A;   HYSTEROSCOPY N/A 05/26/2013   Procedure: UNSUCCESSFUL HYSTEROSCOPY WITH HYDROTHERMAL ABLATION;  Surgeon: Elveria Mungo, MD;  Location: [facility];  Service: Gynecology;  Laterality: N/A;   LAPAROSCOPY  12/25/2010    Procedure: LAPAROSCOPY OPERATIVE;  Surgeon: Olam DELENA Mill, MD;  Location: [facility];  Service: Gynecology;  Laterality: N/A;  Operative laparoscopy /cauterization of right hemorrhagic cyst wall. Removal of belly rings.   LAPAROSCOPY FOR ECTOPIC PREGNANCY     NOVASURE ABLATION N/A 05/26/2013   Procedure: UNSUCCESSFUL NOVASURE ABLATION;  Surgeon: Elveria Mungo, MD;  Location: [facility];  Service: Gynecology;  Laterality: N/A;   uterine biopsy     VAGINAL HYSTERECTOMY N/A 06/19/2013   Procedure:  TOTAL VAGINAL HYSTERECTOMY with bilateral salpingectomy;  Surgeon: Elveria Mungo, MD;  Location: [facility];  Service: Gynecology;  Laterality: N/A;    Review of Systems Pertinent positives and negative findings are listed as part of the History of Present Illness and MDM  Physical Exam Vitals:   12/30/23 1921 12/30/23 1924 12/30/23 2100 12/30/23 2239  BP:  (!) 158/88 121/85 (!) 132/112  Pulse: (!) 124 (!) 124 (!) 123 (!) 120  Resp: (!) 25  (!) 27 17  Temp:    97.8 F (36.6 C)  TempSrc:    Oral  SpO2: 100%  100% 100%     Constitutional Nursing notes reviewed Vital signs reviewed  HEENT No obvious trauma Pupils round, equal, and reactive to light. Pupils cross midline Uvula midline with no evidence of deviation.  No evidence of PTA or tonsillar exudates  Respiratory Effort normal Breathing well on room air CTAB  CV Tachycardic and regular rhythm No pitting edema  Abdomen  Soft, generalized tenderness to palpation, worse in the right lower quadrant No peritonitis  MSK Atraumatic No obvious deformity ROM appropriate  Neuro Awake and alert Pupils cross midline Moving all extremities    MDM:  Initial Differential Diagnoses includes viral URI, pneumonia, ACS, gastritis, appendicitis, cholecystitis, alcohol withdrawal, dehydration, electrolyte abnormality, peptic ulcer disease, gastric reflux, PTA, pharyngitis  I reviewed the patient's vitals, the nursing triage note and  evaluated the patient at bedside.  Patient presents with several days of nausea, vomiting, inability to tolerate p.o. intake, sore throat and exertional chest pain.  Denies fevers.  She was binge drinking for 7 days prior to onset of symptoms.  She is tachycardic with mild tachypnea on my evaluation.  No hypoxia.  Although I think alcohol use is contributing, I have low suspicion for alcohol withdrawal as the sole cause for her symptoms.  She has not responded to Valium .  She has no diaphoresis or tremors.  I considered ACS and reviewed/interpreted the EKG. EKG shows sinus tachycardia without any evidence of acute ischemic changes.  No evidence of new onset arrhythmia or high-grade conduction block.  Troponin mildly elevated to 24.  I have low suspicion for ACS.  This may represent demand ischemia  CT PE shows no evidence of PE but does show severe hepatic steatosis..  CT abdomen pelvis ordered given her symptoms which shows no acute process to explain her symptoms.  Patient may have cholelithiasis but she has no cholestatic liver pattern.  She has no significant right upper quadrant tenderness.  I have low suspicion for symptomatic cholelithiasis. I considered pharyngitis/PTA the patient's posterior oropharynx is clear.  Uvula is midline with no deviation.  No evidence of PTA.  No exudates.[name] of her labs show an anion gap metabolic acidosis with anion gap of 27.  Bicarb of 14.  She has an AKI with creatinine of 1.5.  Mild leukocytosis to 11.  COVID/flu testing negative.  Patient given fluid resuscitation, Maalox, viscous lidocaine , Pepcid , thiamine  and folic acid .  She remains tachycardic.  She was admitted for further workup and care  Procedures: Procedures  Medications administered in the ED: Medications  LORazepam  (ATIVAN ) tablet 1-4 mg (has no administration in time range)    Or  LORazepam  (ATIVAN ) injection 1-4 mg (has no administration in time range)  thiamine  (VITAMIN B1) tablet 100  mg (has no administration in time range)    Or  thiamine  (VITAMIN B1) injection 100 mg (has no administration in time range)  folic acid  (FOLVITE ) tablet 1 mg (has no administration in time range)  multivitamin with minerals tablet 1 tablet (has no administration in time range)  potassium chloride  10 mEq in 100 mL IVPB (has no administration in time range)  pantoprazole  (PROTONIX ) injection 40 mg (has no administration in time range)  lactated ringers  bolus 1,000 mL (0 mLs Intravenous Stopped 12/30/23 1925)  diazepam  (VALIUM ) injection 5 mg (5 mg Intravenous Given 12/30/23 1726)  ondansetron  (ZOFRAN ) injection 4 mg (4 mg Intravenous Given 12/30/23 1803)  diazepam  (VALIUM ) injection 5 mg (5 mg Intravenous Given 12/30/23 1830)  potassium chloride  10 mEq in 100 mL IVPB (0 mEq Intravenous Stopped 12/30/23 2219)  lactated ringers  bolus 1,000 mL (1,000 mLs Intravenous New Bag/Given 12/30/23 1833)  iohexol  (OMNIPAQUE ) 350 MG/ML injection 75 mL (75 mLs Intravenous Contrast Given 12/30/23 2007)  famotidine  (PEPCID ) IVPB 20 mg premix (20 mg Intravenous New Bag/Given 12/30/23 2207)  alum & mag hydroxide-simeth (MAALOX/MYLANTA) 200-200-20 MG/5ML suspension 30 mL (30 mLs Oral  Given 12/30/23 2210)    And  lidocaine  (XYLOCAINE ) 2 % viscous mouth solution 15 mL (15 mLs Oral Given 12/30/23 2210)  diazepam  (VALIUM ) injection 5 mg (5 mg Intravenous Given 12/30/23 2208)  iohexol  (OMNIPAQUE ) 350 MG/ML injection 75 mL (75 mLs Intravenous Contrast Given 12/30/23 2154)  thiamine  (VITAMIN B1) injection 100 mg (100 mg Intravenous Given 12/30/23 2227)  folic acid  (FOLVITE ) tablet 1 mg (1 mg Oral Given 12/30/23 2226)     Impression: 1. High anion gap metabolic acidosis   2. Nausea and vomiting, unspecified vomiting type   3. Sore throat   4. Generalized abdominal pain      Patient's presentation is most consistent with acute presentation with potential threat to life or bodily function.  Disposition: ED Disposition:  Admit     ED Discharge Orders     None             Dionisio Blunt, MD 12/31/23 VELVET    Bernard Drivers, MD 12/31/23 1747

## 2023-12-30 NOTE — Assessment & Plan Note (Signed)
 In the setting of dehydration And alcohol withdrawal CTA negative for PE TSH unremarkable We will rehydrate and follow Give slightly elevated troponin order echo

## 2023-12-30 NOTE — ED Notes (Signed)
 CCMD contacted to place pt on cardiac monitoring

## 2023-12-30 NOTE — H&P (Signed)
 Danielle Reilly FMW:[id number] DOB: [birth year] DOA: 12/30/2023     PCP: Purcell Emil Schanz, MD     Patient arrived to ER on 12/30/23 at 1623 Referred by Attending Bernard Drivers, MD   Patient coming from:    home Lives   With family    Chief Complaint:   Chief Complaint  Patient presents with   Chest Pain   Alcohol Intoxication    HPI: Danielle Reilly is a 48 y.o. female with medical history significant of alcohol abuse, GERD, anxiety  Presented with chest pain shortness of breath nausea vomiting  Patient presents states she is going for alcohol withdrawal last drink was yesterday afternoon Has been having significant nausea vomiting unable to keep anything down Reports chest pain and pressure Heart rate in 120s 130s when ambulating.  Patient reports that she has been drinking approximately fifth of hard liquor per day for the past 7 days Denies having any withdrawal seizures Reports abdominal pain nausea vomiting exertional chest pain sore throat burping History of GERD and depression as well as gastritis Patient was given IV fluids 5 mg of Valium  and Zofran       significant ETOH intake   Does not smoke      Regarding pertinent Chronic problems:   Depression on Prozac  and trazodone     Liver disease Computed MELD 3.0 unavailable. One or more values for this score either were not found within the given timeframe or did not fit some other criterion. Computed MELD-Na unavailable. One or more values for this score either were not found within the given timeframe or did not fit some other criterion.  Hepatic Function Panel     Component Value Date/Time   PROT 8.7 (H) 12/30/2023 1647   ALBUMIN 4.8 12/30/2023 1647   AST 78 (H) 12/30/2023 1647   ALT 35 12/30/2023 1647   ALKPHOS 73 12/30/2023 1647   BILITOT 2.2 (H) 12/30/2023 1647   BILIDIR 0.1 10/28/2019 0646   IBILI 1.2 (H) 10/28/2019 0646      While in ER:     Noted to be tachycardic likely secondary to  dehydration also found to have AKI CTA negative for PE    Lab Orders         Comprehensive metabolic panel         Ethanol         cbc         Rapid urine drug screen (hospital performed)         Lipase, blood         Magnesium        CXR -  NON acute  CTabd/pelvis -mild fullness in the right collecting system no hydronephrosis could be secondary to recent passed calculus versus chronic changes secondary to uropathy versus reflux Could not rule out cholelithiasis May benefit from right upper quadrant ultraSound Hepatic steatosis  CTA chest -  non-acute, no PE, no evidence of infiltrate  Following Medications were ordered in ER: Medications  famotidine  (PEPCID ) IVPB 20 mg premix (has no administration in time range)  alum & mag hydroxide-simeth (MAALOX/MYLANTA) 200-200-20 MG/5ML suspension 30 mL (has no administration in time range)    And  lidocaine  (XYLOCAINE ) 2 % viscous mouth solution 15 mL (has no administration in time range)  diazepam  (VALIUM ) injection 5 mg (has no administration in time range)  lactated ringers  bolus 1,000 mL (0 mLs Intravenous Stopped 12/30/23 1925)  diazepam  (VALIUM ) injection 5 mg (5 mg Intravenous Given 12/30/23 1726)  ondansetron  (ZOFRAN ) injection 4 mg (4 mg Intravenous Given 12/30/23 1803)  diazepam  (VALIUM ) injection 5 mg (5 mg Intravenous Given 12/30/23 1830)  potassium chloride  10 mEq in 100 mL IVPB (10 mEq Intravenous New Bag/Given 12/30/23 1836)  lactated ringers  bolus 1,000 mL (1,000 mLs Intravenous New Bag/Given 12/30/23 1833)  iohexol  (OMNIPAQUE ) 350 MG/ML injection 75 mL (75 mLs Intravenous Contrast Given 12/30/23 2007)        ED Triage Vitals  Encounter Vitals Group     BP 12/30/23 1628 127/85     Girls Systolic BP Percentile --      Girls Diastolic BP Percentile --      Boys Systolic BP Percentile --      Boys Diastolic BP Percentile --      Pulse Rate 12/30/23 1628 (!) 137     Resp 12/30/23 1628 16     Temp 12/30/23 1628 98.4 F (36.9  C)     Temp src --      SpO2 12/30/23 1628 99 %     Weight --      Height --      Head Circumference --      Peak Flow --      Pain Score 12/30/23 1630 10     Pain Loc --      Pain Education --      Exclude from Growth Chart --   UFJK(75)@     _________________________________________ Significant initial  Findings: Abnormal Labs Reviewed  COMPREHENSIVE METABOLIC PANEL WITH GFR - Abnormal; Notable for the following components:      Result Value   Sodium 133 (*)    Potassium 3.3 (*)    Chloride 92 (*)    CO2 14 (*)    Glucose, Bld 114 (*)    Creatinine, Ser 1.49 (*)    Total Protein 8.7 (*)    AST 78 (*)    Total Bilirubin 2.2 (*)    GFR, Estimated 43 (*)    Anion gap 27 (*)    All other components within normal limits  CBC - Abnormal; Notable for the following components:   WBC 10.8 (*)    All other components within normal limits  TROPONIN I (HIGH SENSITIVITY) - Abnormal; Notable for the following components:   Troponin I (High Sensitivity) 22 (*)    All other components within normal limits  TROPONIN I (HIGH SENSITIVITY) - Abnormal; Notable for the following components:   Troponin I (High Sensitivity) 24 (*)    All other components within normal limits    _________________________ Troponin  ordered Cardiac Panel (last 3 results) Recent Labs    12/30/23 1647 12/30/23 2046  TROPONINIHS 22* 24*     ECG: Ordered Personally reviewed and interpreted by me showing: HR : 118 Rhythm:Sinus tachycardia Borderline T abnormalities, anterior leads QTC 473    The recent clinical data is shown below. Vitals:   12/30/23 1920 12/30/23 1921 12/30/23 1924 12/30/23 2100  BP: (!) 158/88  (!) 158/88 121/85  Pulse:  (!) 124 (!) 124 (!) 123  Resp:  (!) 25  (!) 27  Temp:      SpO2:  100%  100%      WBC     Component Value Date/Time   WBC 10.8 (H) 12/30/2023 1647   LYMPHSABS 1.3 10/10/2023 1530   MONOABS 0.4 10/10/2023 1530   EOSABS 0.0 10/10/2023 1530   BASOSABS 0.0  10/10/2023 1530    Lactic Acid, Venous    Component Value  Date/Time   LATICACIDVEN 1.0 12/21/2021 0303     Lactic Acid, Venous    Component Value Date/Time   LATICACIDVEN 1.0 12/21/2021 0303    Procalcitonin   Ordered      UA   ordered     Results for orders placed or performed during the hospital encounter of 10/10/23  Urine Culture     Status: None   Collection Time: 10/10/23 11:08 PM   Specimen: Urine, Clean Catch  Result Value Ref Range Status   Specimen Description URINE, CLEAN CATCH  Final   Special Requests NONE  Final   Culture   Final    NO GROWTH Performed at [facility], [address], [place], [place] [address]    Report Status 10/11/2023 FINAL  Final   VBG pending __________________________________________________________ Recent Labs  Lab 12/30/23 1647 12/30/23 2046  NA 133*  --   K 3.3*  --   CO2 14*  --   GLUCOSE 114*  --   BUN 6  --   CREATININE 1.49*  --   CALCIUM 10.0  --   MG  --  1.7    Cr   Up from baseline see below Lab Results  Component Value Date   CREATININE 1.49 (H) 12/30/2023   CREATININE 0.90 10/10/2023   CREATININE 0.88 10/10/2023    Recent Labs  Lab 12/30/23 1647  AST 78*  ALT 35  ALKPHOS 73  BILITOT 2.2*  PROT 8.7*  ALBUMIN 4.8   Lab Results  Component Value Date   CALCIUM 10.0 12/30/2023   PHOS 2.8 06/17/2019        Plt: Lab Results  Component Value Date   PLT 178 12/30/2023       Recent Labs  Lab 12/30/23 1647  WBC 10.8*  HGB 12.7  HCT 38.6  MCV 85.2  PLT 178    HG/HCT  stable,       Component Value Date/Time   HGB 12.7 12/30/2023 1647   HCT 38.6 12/30/2023 1647   MCV 85.2 12/30/2023 1647      Recent Labs  Lab 12/30/23 1647  LIPASE 31   No results for input(s): AMMONIA in the last 168 hours.    _______________________________________________ Hospitalist was called for admission for dehydration and AKI metabolic acidosis and alcohol withdrawal  The following Work up  has been ordered so far:  Orders Placed This Encounter  Procedures   DG Chest Port 1 View   CT ABDOMEN PELVIS W CONTRAST   CT Angio Chest PE W and/or Wo Contrast   Comprehensive metabolic panel   Ethanol   cbc   Rapid urine drug screen (hospital performed)   Lipase, blood   Magnesium    Clinical institute withdrawal assessment   Notify Physician if patient has NO ativan  orders and CIWA score >/=5   Notify Physician if patient HAS Ativan  orders and CIWA score >15   Consult to hospitalist   EKG 12-Lead   EKG 12-Lead   EKG 12-Lead     OTHER Significant initial  Findings:  labs showing:     DM  labs:  HbA1C: Recent Labs    08/07/23 1127  HGBA1C 5.9       CBG (last 3)  No results for input(s): GLUCAP in the last 72 hours.        Cultures:    Component Value Date/Time   SDES URINE, CLEAN CATCH 10/10/2023 2308   SPECREQUEST NONE 10/10/2023 2308   CULT  10/10/2023 2308  NO GROWTH Performed at [facility], [address], [place], [place] [address]    REPTSTATUS 10/11/2023 FINAL 10/10/2023 2308     Radiological Exams on Admission: CT Angio Chest PE W and/or Wo Contrast Result Date: 12/30/2023 EXAM: CTA of the Chest with contrast for PE 12/30/2023 09:53:21 PM TECHNIQUE: CTA of the chest was performed after the administration of intravenous contrast. Multiplanar reformatted images are provided for review. MIP images are provided for review. Automated exposure control, iterative reconstruction, and/or weight based adjustment of the mA/kV was utilized to reduce the radiation dose to as low as reasonably achievable. COMPARISON: 07/27/2018 CLINICAL HISTORY: High suspicion for PE. Chest pain, alcohol withdrawal. FINDINGS: PULMONARY ARTERIES: Pulmonary arteries are adequately opacified for evaluation. No pulmonary embolism. Main pulmonary artery is normal in caliber. MEDIASTINUM: The heart and pericardium demonstrate no acute abnormality. There is no acute abnormality  of the thoracic aorta. LYMPH NODES: No mediastinal, hilar or axillary lymphadenopathy. LUNGS AND PLEURA: The lungs are without acute process. No focal consolidation or pulmonary edema. No pleural effusion or pneumothorax. UPPER ABDOMEN: Severe hepatic steatosis. SOFT TISSUES AND BONES: No acute bone or soft tissue abnormality. IMPRESSION: 1. No pulmonary embolism. 2. Severe hepatic steatosis. Electronically signed by: Dorethia Molt MD 12/30/2023 10:01 PM EDT RP Workstation: [id number]   CT ABDOMEN PELVIS W CONTRAST Result Date: 12/30/2023 CLINICAL DATA:  RLQ abdominal pain and anorexia. Evaluate for appendicits EXAM: CT ABDOMEN AND PELVIS WITH CONTRAST TECHNIQUE: Multidetector CT imaging of the abdomen and pelvis was performed using the standard protocol following bolus administration of intravenous contrast. RADIATION DOSE REDUCTION: This exam was performed according to the departmental dose-optimization program which includes automated exposure control, adjustment of the mA and/or kV according to patient size and/or use of iterative reconstruction technique. CONTRAST:  75mL OMNIPAQUE  IOHEXOL  350 MG/ML SOLN COMPARISON:  CT abdomen pelvis 10/10/2023 FINDINGS: Lower chest: No acute abnormality. Hepatobiliary: The hepatic parenchyma is diffusely hypodense compared to the splenic parenchyma consistent with fatty infiltration. No focal liver abnormality. Question gallstones within the gallbladder lumen. No CT evidence of gallbladder wall thickening. No pericholecystic fluid. No biliary dilatation. Pancreas: Diffusely atrophic. No focal lesion. Otherwise normal pancreatic contour. No surrounding inflammatory changes. No main pancreatic ductal dilatation. Spleen: Normal in size without focal abnormality. Adrenals/Urinary Tract: No adrenal nodule bilaterally. Bilateral kidneys enhance symmetrically. Mild fullness of the right collecting system with no frank hydronephrosis. No hydronephrosis. No hydroureter. No  nephroureterolithiasis. The urinary bladder is unremarkable. Stomach/Bowel: Stomach is within normal limits. No evidence of bowel wall thickening or dilatation. Appendix appears normal. Vascular/Lymphatic: No abdominal aorta or iliac aneurysm. No abdominal, pelvic, or inguinal lymphadenopathy. Reproductive: Status post hysterectomy. No adnexal masses. Other: No intraperitoneal free fluid. No intraperitoneal free gas. No organized fluid collection. Musculoskeletal: No abdominal wall hernia or abnormality. No suspicious lytic or blastic osseous lesions. No acute displaced fracture. Multilevel degenerative changes of the spine. IMPRESSION: 1. Mild fullness of the right collecting system with no frank hydronephrosis. This may reflect the residual of a recently passed calculus or reflect chronic changes of obstructive uropathy or reflux. 2. Question cholelithiasis. Recommend correlation with liver function tests ir consider right upper quadrant ultrasound for further evaluation. 3. Hepatic steatosis. 4. Status post hysterectomy. Electronically Signed   By: Morgane  Naveau M.D.   On: 12/30/2023 20:16   DG Chest Port 1 View Result Date: 12/30/2023 CLINICAL DATA:  Chest pain EXAM: PORTABLE CHEST 1 VIEW COMPARISON:  Chest x-ray 07/12/2023 FINDINGS: The heart size and mediastinal contours  are within normal limits. Both lungs are clear. The visualized skeletal structures are unremarkable. IMPRESSION: No active disease. Electronically Signed   By: Greig Pique M.D.   On: 12/30/2023 18:30   _______________________________________________________________________________________________________ Latest  Blood pressure 121/85, pulse (!) 123, temperature 98.4 F (36.9 C), resp. rate (!) 27, last menstrual period 06/19/2013, SpO2 100%.   Vitals  labs and radiology finding personally reviewed  Review of Systems:    Pertinent positives include:  abdominal pain, nausea, vomiting, diarrhea  Constitutional: fatigue, No  weight loss, night sweats, Fevers, chills,  weight loss  HEENT:  No headaches, Difficulty swallowing,Tooth/dental problems,Sore throat,  No sneezing, itching, ear ache, nasal congestion, post nasal drip,  Cardio-vascular:  No chest pain, Orthopnea, PND, anasarca, dizziness, palpitations.no Bilateral lower extremity swelling  GI:  No heartburn, indigestion, , change in bowel habits, loss of appetite, melena, blood in stool, hematemesis Resp:  no shortness of breath at rest. No dyspnea on exertion, No excess mucus, no productive cough, No non-productive cough, No coughing up of blood.No change in color of mucus.No wheezing. Skin:  no rash or lesions. No jaundice GU:  no dysuria, change in color of urine, no urgency or frequency. No straining to urinate.  No flank pain.  Musculoskeletal:  No joint pain or no joint swelling. No decreased range of motion. No back pain.  Psych:  No change in mood or affect. No depression or anxiety. No memory loss.  Neuro: no localizing neurological complaints, no tingling, no weakness, no double vision, no gait abnormality, no slurred speech, no confusion  All systems reviewed and apart from HOPI all are negative _______________________________________________________________________________________________ Past Medical History:   Past Medical History:  Diagnosis Date   Acute pancreatitis    Allergy    SEASONAL   Anemia    Anxiety    A LITTLE   Depression    Gastritis    GERD (gastroesophageal reflux disease)    tums as needed   Hemoperitoneum 12/25/2010   Nausea and vomiting 06/17/2019   Substance abuse (HCC)    ETOH ABUSE   SVD (spontaneous vaginal delivery)    x 1   Toothache 01/29/2013     Past Surgical History:  Procedure Laterality Date   BILATERAL SALPINGECTOMY N/A 06/19/2013   Procedure: BILATERAL SALPINGECTOMY;  Surgeon: Elveria Mungo, MD;  Location: [facility];  Service: Gynecology;  Laterality: N/A;   DILITATION &  CURRETTAGE/HYSTROSCOPY WITH NOVASURE ABLATION N/A 04/04/2013   Procedure: DILATATION & CURETTAGE/HYSTEROSCOPY WITH Attempted NOVASURE ABLATION ;  Surgeon: Elveria Mungo, MD;  Location: [facility];  Service: Gynecology;  Laterality: N/A;   HYSTEROSCOPY N/A 05/26/2013   Procedure: UNSUCCESSFUL HYSTEROSCOPY WITH HYDROTHERMAL ABLATION;  Surgeon: Elveria Mungo, MD;  Location: [facility];  Service: Gynecology;  Laterality: N/A;   LAPAROSCOPY  12/25/2010   Procedure: LAPAROSCOPY OPERATIVE;  Surgeon: Olam DELENA Mill, MD;  Location: [facility];  Service: Gynecology;  Laterality: N/A;  Operative laparoscopy /cauterization of right hemorrhagic cyst wall. Removal of belly rings.   LAPAROSCOPY FOR ECTOPIC PREGNANCY     NOVASURE ABLATION N/A 05/26/2013   Procedure: UNSUCCESSFUL NOVASURE ABLATION;  Surgeon: Elveria Mungo, MD;  Location: [facility];  Service: Gynecology;  Laterality: N/A;   uterine biopsy     VAGINAL HYSTERECTOMY N/A 06/19/2013   Procedure:  TOTAL VAGINAL HYSTERECTOMY with bilateral salpingectomy;  Surgeon: Elveria Mungo, MD;  Location: [facility];  Service: Gynecology;  Laterality: N/A;    Social History:  Ambulatory   independently      reports that  she has never smoked. She has never used smokeless tobacco. She reports that she does not currently use alcohol. She reports that she does not currently use drugs.   Family History:   Family History  Problem Relation Age of Onset   Drug abuse Mother    Arthritis Maternal Aunt    Cancer Maternal Aunt    Drug abuse Maternal Aunt    Arthritis Maternal Uncle    Alcohol abuse Maternal Uncle    Cancer Maternal Uncle    Drug abuse Maternal Uncle    Cancer Maternal Grandmother    Alcohol abuse Maternal Grandfather    Cancer Maternal Grandfather    Colon cancer Neg Hx    Colon polyps Neg Hx    Crohn's disease Neg Hx    Esophageal cancer Neg Hx    Rectal cancer Neg Hx    Stomach cancer Neg Hx    Ulcerative colitis Neg Hx     ______________________________________________________________________________________________ Allergies: Allergies  Allergen Reactions   Fluconazole  Hives   Hydrocodone  Nausea And Vomiting     Prior to Admission medications   Medication Sig Start Date End Date Taking? Authorizing Provider  Azelastine  HCl 137 MCG/SPRAY SOLN PLACE 2 SPRAYS INTO BOTH NOSTRILS 2 (TWO) TIMES DAILY. USE IN EACH NOSTRIL AS DIRECTED 12/03/23   Purcell Emil Schanz, MD  cephALEXin  (KEFLEX ) 500 MG capsule Take 1 capsule (500 mg total) by mouth 4 (four) times daily. 10/10/23   Zackowski, Scott, MD  FLUoxetine  (PROZAC ) 20 MG capsule Take 1 capsule (20 mg total) by mouth daily. 11/29/23   Arfeen, Leni DASEN, MD  fluticasone  (FLONASE ) 50 MCG/ACT nasal spray Place 1 spray into both nostrils daily as needed for allergies or rhinitis.    [provider]  LORazepam  (ATIVAN ) 1 MG tablet Take 1 tablet (1 mg total) by mouth 3 (three) times daily as needed for anxiety. 10/10/23   Zackowski, Scott, MD  Multiple Vitamin (MULTIVITAMINS PO) Take 1 tablet by mouth daily.    [provider]  naltrexone  (DEPADE) 50 MG tablet Take 1 tablet (50 mg total) by mouth daily. 11/29/23   Arfeen, Leni DASEN, MD  pantoprazole  (PROTONIX ) 40 MG tablet Take 1 tablet (40 mg total) by mouth daily. 07/13/23   Mesner, Selinda, MD  traZODone  (DESYREL ) 50 MG tablet Take 1-2 tablets (50-100 mg total) by mouth at bedtime. 11/29/23   Arfeen, Leni DASEN, MD  famotidine  (PEPCID ) 20 MG tablet Take 1 tablet (20 mg total) by mouth daily. Patient not taking: Reported on 10/28/2019 05/19/19 04/06/20  Dean Clarity, MD    ___________________________________________________________________________________________________ Physical Exam:    12/30/2023    9:00 PM 12/30/2023    7:24 PM 12/30/2023    7:21 PM  Vitals with BMI  Systolic 121 158   Diastolic 85 88   Pulse 123 124 124     1. General:  in No  Acute distress   Ill -appearing 2. Psychological: Alert  and   Oriented 3. Head/ENT:   Dry Mucous Membranes                          Head Non traumatic, neck supple                        Poor Dentition 4. SKIN decreased Skin turgor,  Skin clean Dry and intact no rash    5. Heart: Regular rate and rhythm no  Murmur, no Rub or gallop 6.  Lungs:  no wheezes or crackles   7. Abdomen: Soft, slight epigastric/RUQ -tenderness, Non distended  obese  bowel sounds present 8. Lower extremities: no clubbing, cyanosis, no  edema 9. Neurologically Grossly intact, moving all 4 extremities equally  mild tremor 10. MSK: Normal range of motion    Chart has been reviewed  ______________________________________________________________________________________________  Assessment/Plan 48 y.o. female with medical history significant of alcohol abuse, GERD, anxiety   Admitted for AKI dehydration alcohol withdrawal  Present on Admission:  AKI (acute kidney injury) (HCC)  Alcohol abuse with other alcohol-induced disorder (HCC)  GERD (gastroesophageal reflux disease)  Dehydration  Chest pain  Hypokalemia  Tachycardia  RUQ abdominal tenderness     AKI (acute kidney injury) (HCC) -  evidence of acute renal failure due to presence of following: Cr increased >0.3 from baseline   likely secondary to dehydration,        check FeNA       Rehydrate with IV fluids       History does not suggest urinary retention or obstruction      Alcohol abuse with other alcohol-induced disorder (HCC) CIWA protocol  GERD (gastroesophageal reflux disease) Protonix  orderd  Dehydration Will rehydrate  Follow fluid status  Chest pain In the setting of nausea vomiting order Protonix  Given slight elevated troponin Borderline T wave abnormality will order echogram and monitor on telemetry  Hypokalemia - will replace electrolytes and repeat  check Mg, phos and Ca level and replace as needed Monitor on telemetry   Lab Results  Component Value Date   K 3.3 (L)  12/30/2023     Lab Results  Component Value Date   CREATININE 1.49 (H) 12/30/2023   Lab Results  Component Value Date   MG 1.7 12/30/2023   Lab Results  Component Value Date   CALCIUM 10.0 12/30/2023   PHOS 2.8 06/17/2019     Tachycardia In the setting of dehydration And alcohol withdrawal CTA negative for PE TSH unremarkable We will rehydrate and follow Give slightly elevated troponin order echo  RUQ abdominal tenderness Given the right upper quadrant tenderness nausea and vomiting with slight LFT elevations and equivocal CT will obtain right upper quadrant ultrasound   Other plan as per orders.  DVT prophylaxis:  SCD        Code Status:    Code Status: Prior FULL CODE   as per patient   I had personally discussed CODE STATUS with patient   ACP   none    Family Communication:   Family not at  Bedside    Diet clear   Disposition Plan:        To home once workup is complete and patient is stable   Following barriers for discharge:                             Chest pain work up is complete                            Electrolytes corrected                                                            Pain controlled with PO medications  Will need to be able to tolerate PO                                    Consult Orders  (From admission, onward)           Start     Ordered   12/30/23 2106  Consult to hospitalist  PG SENT BY DELORIS  [facility]  Once       Provider:  (Not yet assigned)  Question Answer Comment  Place call to: [facility]   Reason for Consult Admit      12/30/23 2105                              Transition of care consulted                 Consults called:    NONE   Admission status:  ED Disposition     ED Disposition  Admit   Condition  --   Comment  The patient appears reasonably stabilized for admission considering the current resources, flow, and  capabilities available in the ED at this time, and I doubt any other [facility] requiring further screening and/or treatment in the ED prior to admission is  present.            inpatient     I Expect 2 midnight stay secondary to severity of patient's current illness need for inpatient interventions justified by the following:  hemodynamic instability despite optimal treatment (tachycardia )   Severe lab/radiological/exam abnormalities including:   aki and extensive comorbidities including:  substance abuse     That are currently affecting medical management.   I expect  patient to be hospitalized for 2 midnights requiring inpatient medical care.  Patient is at high risk for adverse outcome (such as loss of life or disability) if not treated.  Indication for inpatient stay as follows:   Hemodynamic instability despite maximal medical therapy,    inability to maintain oral hydration   persistent chest pain despite medical management    Need for   IV fluids,, IV pain medications     Level of care      progressive     Elijahjames Fuelling 12/31/2023, 12:54 AM    [facility]     after 2 AM please page floor coverage   If 7AM-7PM, please contact the day team taking care of the patient using [email or web address]

## 2023-12-30 NOTE — ED Triage Notes (Signed)
 Pt states she is going through alcohol withdrawal. She states she last drank yesterday afternoon. She is unable to hold any type of food or drink down. She is also complaining of chest pain and pressure. She endorses that chest pain started yesterday afternoon as well.

## 2023-12-30 NOTE — Assessment & Plan Note (Signed)
 CIWA protocol

## 2023-12-30 NOTE — Subjective & Objective (Addendum)
 Patient presents states she is going for alcohol withdrawal last drink was yesterday afternoon Has been having significant nausea vomiting unable to keep anything down Reports chest pain and pressure Heart rate in 120s 130s when ambulating.  Patient reports that she has been drinking approximately fifth of hard liquor per day for the past 7 days Denies having any withdrawal seizures Reports abdominal pain nausea vomiting exertional chest pain sore throat burping History of GERD and depression as well as gastritis Patient was given IV fluids 5 mg of Valium  and Zofran 

## 2023-12-31 ENCOUNTER — Inpatient Hospital Stay (HOSPITAL_COMMUNITY): Payer: MEDICAID

## 2023-12-31 ENCOUNTER — Encounter (HOSPITAL_COMMUNITY): Payer: Self-pay | Admitting: Internal Medicine

## 2023-12-31 DIAGNOSIS — N179 Acute kidney failure, unspecified: Secondary | ICD-10-CM | POA: Diagnosis not present

## 2023-12-31 DIAGNOSIS — R079 Chest pain, unspecified: Secondary | ICD-10-CM

## 2023-12-31 DIAGNOSIS — R10811 Right upper quadrant abdominal tenderness: Secondary | ICD-10-CM | POA: Diagnosis present

## 2023-12-31 LAB — PHOSPHORUS
Phosphorus: <1 mg/dL — CL (ref 2.5–4.6)
Phosphorus: <1 mg/dL — CL (ref 2.5–4.6)
Phosphorus: 1.9 mg/dL — ABNORMAL LOW (ref 2.5–4.6)

## 2023-12-31 LAB — BLOOD GAS, VENOUS
Acid-Base Excess: 1.9 mmol/L (ref 0.0–2.0)
Bicarbonate: 26.6 mmol/L (ref 20.0–28.0)
O2 Saturation: 90.4 %
Patient temperature: 37
pCO2, Ven: 41 mmHg — ABNORMAL LOW (ref 44–60)
pH, Ven: 7.42 (ref 7.25–7.43)
pO2, Ven: 56 mmHg — ABNORMAL HIGH (ref 32–45)

## 2023-12-31 LAB — URINALYSIS, ROUTINE W REFLEX MICROSCOPIC
Bacteria, UA: NONE SEEN
Bilirubin Urine: NEGATIVE
Glucose, UA: NEGATIVE mg/dL
Ketones, ur: 20 mg/dL — AB
Nitrite: NEGATIVE
Protein, ur: 30 mg/dL — AB
Specific Gravity, Urine: 1.032 — ABNORMAL HIGH (ref 1.005–1.030)
pH: 5 (ref 5.0–8.0)

## 2023-12-31 LAB — RAPID URINE DRUG SCREEN, HOSP PERFORMED
Amphetamines: NOT DETECTED
Barbiturates: NOT DETECTED
Benzodiazepines: POSITIVE — AB
Cocaine: NOT DETECTED
Opiates: NOT DETECTED
Tetrahydrocannabinol: NOT DETECTED

## 2023-12-31 LAB — ECHOCARDIOGRAM COMPLETE
AR max vel: 2.68 cm2
AV Area VTI: 2.56 cm2
AV Area mean vel: 2.44 cm2
AV Mean grad: 4 mmHg
AV Peak grad: 6.2 mmHg
Ao pk vel: 1.24 m/s
Area-P 1/2: 6.71 cm2
S' Lateral: 3.2 cm

## 2023-12-31 LAB — COMPREHENSIVE METABOLIC PANEL WITH GFR
ALT: 25 U/L (ref 0–44)
AST: 63 U/L — ABNORMAL HIGH (ref 15–41)
Albumin: 3.6 g/dL (ref 3.5–5.0)
Alkaline Phosphatase: 57 U/L (ref 38–126)
Anion gap: 11 (ref 5–15)
BUN: 5 mg/dL — ABNORMAL LOW (ref 6–20)
CO2: 22 mmol/L (ref 22–32)
Calcium: 9.3 mg/dL (ref 8.9–10.3)
Chloride: 102 mmol/L (ref 98–111)
Creatinine, Ser: 1.25 mg/dL — ABNORMAL HIGH (ref 0.44–1.00)
GFR, Estimated: 53 mL/min — ABNORMAL LOW (ref >60)
Glucose, Bld: 104 mg/dL — ABNORMAL HIGH (ref 70–99)
Potassium: 4.2 mmol/L (ref 3.5–5.1)
Sodium: 135 mmol/L (ref 135–145)
Total Bilirubin: 1.3 mg/dL — ABNORMAL HIGH (ref 0.0–1.2)
Total Protein: 6.8 g/dL (ref 6.5–8.1)

## 2023-12-31 LAB — PROCALCITONIN: Procalcitonin: <0.1 ng/mL

## 2023-12-31 LAB — T4, FREE: Free T4: 0.56 ng/dL — ABNORMAL LOW (ref 0.61–1.12)

## 2023-12-31 LAB — PROTIME-INR
INR: 1 (ref 0.8–1.2)
Prothrombin Time: 14.2 s (ref 11.4–15.2)

## 2023-12-31 LAB — CBC
HCT: 32.3 % — ABNORMAL LOW (ref 36.0–46.0)
Hemoglobin: 10.7 g/dL — ABNORMAL LOW (ref 12.0–15.0)
MCH: 28.5 pg (ref 26.0–34.0)
MCHC: 33.1 g/dL (ref 30.0–36.0)
MCV: 86.1 fL (ref 80.0–100.0)
Platelets: 113 K/uL — ABNORMAL LOW (ref 150–400)
RBC: 3.75 MIL/uL — ABNORMAL LOW (ref 3.87–5.11)
RDW: 14.7 % (ref 11.5–15.5)
WBC: 7.3 K/uL (ref 4.0–10.5)
nRBC: 0.3 % — ABNORMAL HIGH (ref 0.0–0.2)

## 2023-12-31 LAB — AMMONIA: Ammonia: 32 umol/L (ref 9–35)

## 2023-12-31 LAB — TROPONIN I (HIGH SENSITIVITY): Troponin I (High Sensitivity): 12 ng/L (ref <18)

## 2023-12-31 LAB — PREALBUMIN: Prealbumin: 23 mg/dL (ref 18–38)

## 2023-12-31 LAB — CK: Total CK: 510 U/L — ABNORMAL HIGH (ref 38–234)

## 2023-12-31 LAB — MAGNESIUM: Magnesium: 1.8 mg/dL (ref 1.7–2.4)

## 2023-12-31 LAB — HIV ANTIBODY (ROUTINE TESTING W REFLEX): HIV Screen 4th Generation wRfx: NONREACTIVE

## 2023-12-31 LAB — OSMOLALITY: Osmolality: 292 mosm/kg (ref 275–295)

## 2023-12-31 LAB — LACTIC ACID, PLASMA: Lactic Acid, Venous: 1.2 mmol/L (ref 0.5–1.9)

## 2023-12-31 MED ORDER — POTASSIUM & SODIUM PHOSPHATES 280-160-250 MG PO PACK
2.0000 | PACK | Freq: Once | ORAL | Status: AC
Start: 2023-12-31 — End: 2023-12-31
  Administered 2023-12-31: 2 via ORAL
  Filled 2023-12-31: qty 2

## 2023-12-31 MED ORDER — MENTHOL 3 MG MT LOZG
1.0000 | LOZENGE | OROMUCOSAL | Status: DC | PRN
  Administered 2023-12-31 – 2024-01-02 (×2): 3 mg via ORAL
  Filled 2023-12-31: qty 9

## 2023-12-31 MED ORDER — POTASSIUM & SODIUM PHOSPHATES 280-160-250 MG PO PACK: 1.0000 | PACK | Freq: Once | ORAL | Status: DC

## 2023-12-31 MED ORDER — ALUM & MAG HYDROXIDE-SIMETH 200-200-20 MG/5ML PO SUSP
30.0000 mL | Freq: Four times a day (QID) | ORAL | Status: DC | PRN
  Administered 2024-01-02 – 2024-01-04 (×4): 30 mL via ORAL
  Filled 2023-12-31 (×4): qty 30

## 2023-12-31 MED ORDER — POTASSIUM & SODIUM PHOSPHATES 280-160-250 MG PO PACK
2.0000 | PACK | Freq: Once | ORAL | Status: AC
  Administered 2023-12-31: 2 via ORAL
  Filled 2023-12-31: qty 2

## 2023-12-31 MED ORDER — ONDANSETRON HCL 4 MG/2ML IJ SOLN: 4.0000 mg | Freq: Four times a day (QID) | INTRAMUSCULAR | Status: DC | PRN

## 2023-12-31 MED ORDER — GUAIFENESIN ER 600 MG PO TB12
600.0000 mg | ORAL_TABLET | Freq: Two times a day (BID) | ORAL | Status: DC
  Administered 2023-12-31 – 2024-01-01 (×2): 600 mg via ORAL
  Filled 2023-12-31 (×2): qty 1

## 2023-12-31 MED ORDER — POTASSIUM & SODIUM PHOSPHATES 280-160-250 MG PO PACK
2.0000 | PACK | Freq: Three times a day (TID) | ORAL | Status: AC
  Administered 2023-12-31 (×4): 2 via ORAL
  Filled 2023-12-31 (×6): qty 2

## 2023-12-31 MED ORDER — FLUOXETINE HCL 20 MG PO CAPS
20.0000 mg | ORAL_CAPSULE | Freq: Every day | ORAL | Status: DC
Start: 2023-12-31 — End: 2024-01-04
  Administered 2023-12-31 – 2024-01-04 (×4): 20 mg via ORAL
  Filled 2023-12-31 (×4): qty 1

## 2023-12-31 MED ORDER — SODIUM CHLORIDE 0.9 % IV SOLN: INTRAVENOUS | Status: AC

## 2023-12-31 MED ORDER — SODIUM PHOSPHATES 45 MMOLE/15ML IV SOLN
30.0000 mmol | Freq: Once | INTRAVENOUS | Status: AC
  Administered 2023-12-31: 30 mmol via INTRAVENOUS
  Filled 2023-12-31: qty 10

## 2023-12-31 MED ORDER — ONDANSETRON HCL 4 MG PO TABS
4.0000 mg | ORAL_TABLET | Freq: Four times a day (QID) | ORAL | Status: DC | PRN
  Administered 2023-12-31: 4 mg via ORAL
  Filled 2023-12-31: qty 1

## 2023-12-31 MED ORDER — SODIUM CHLORIDE 0.9 % IV SOLN: INTRAVENOUS | Status: DC

## 2023-12-31 NOTE — Plan of Care (Signed)

## 2023-12-31 NOTE — Plan of Care (Signed)
 Persistently low phosphate level.  Starting oral Phos-Nak 3 times daily with meal for 1 day.  Mikhaela Zaugg, MD [facility] 12/31/2023, 5:46 AM

## 2023-12-31 NOTE — Plan of Care (Signed)
 Hepatic ultrasound no evidence of acute cholecystitis.  Normal gallbladder and fatty liver.  Low phosphorus less than 1.  Hypophosphatemia in the setting of chronic alcohol abuse.  Replating with oral Phos-Nak.  Kiona Blume, MD [facility] 12/31/2023, 2:13 AM

## 2023-12-31 NOTE — ED Notes (Signed)
 Patient transported to Ultrasound

## 2023-12-31 NOTE — ED Notes (Signed)
 Lab notified of VBG being sent

## 2023-12-31 NOTE — Progress Notes (Signed)
 PROGRESS NOTE  Danielle Reilly FMW:[id number] DOB: 1975/06/11 DOA: 12/30/2023 PCP: [facility] Emil Schanz, MD  HPI/Recap of past 24 hours: Danielle Reilly is a 48 y.o. female with medical history significant of alcohol abuse, GERD, anxiety. Presented with chest pain/indigestion, SOB, significant N/V, also reports she may be in alcohol withdrawal.  Last drink was on 12/29/2023. Patient reports that she has been drinking approximately fifth of hard liquor per day for the past 7 days. Denies having any withdrawal seizures.  In the ED, patient noted to be significantly tachycardic with generalized tremors, otherwise stable.  Labs showed K-->3.3, creatinine 1.49, bicarb 14, WBC 10.8, AST 78, ALT 35, T. bili 2.2, troponin 22--> 24. CTA chest with no evidence of PE, noted severe hepatic steatosis.  CT abdomen/pelvis noted for possible residual of a recently passed calculus, questionable cholelithiasis.  Right upper quadrant ultrasound showed no gallstones or wall thickening visualized, just fatty liver.  Patient admitted for further management.    Today, patient still reporting some midsternal/indigestion, still noted generalized tremors, some nausea, but denies any recent vomiting.   Assessment/Plan: Principal Problem:   AKI (acute kidney injury) (HCC) Active Problems:   Hypokalemia   Tachycardia   GERD (gastroesophageal reflux disease)   Alcohol abuse with other alcohol-induced disorder (HCC)   Dehydration   Chest pain   RUQ abdominal tenderness   Alcohol abuse/withdrawal Patient reports that she has been drinking approximately fifth of hard liquor per day for the past 7 days. Last drink was on 12/29/2023 CIWA protocol Advised to quit  AKI Likely 2/2 above, in addition to poor oral intake nausea/vomiting Continue IV fluids Daily CMP  Mild transaminitis AST elevated, T. bili elevated at 1.3, both improving Noted hepatic steatosis on CT ABD/pelvis Daily  CMP  Thrombocytopenia/normocytic anemia Likely 2/2 alcohol abuse Daily CBC  Hypophosphatemia Replace as needed  Mild rhabdomyolysis CK 510 Continue IV fluids Daily CK  Mildly elevated troponin Likely demand ischemia Now trended down Echo pending  Low free T4 Free T4-->0.56, TSH 1.496 Repeat as outpatient  GERD Continue PPI    Estimated body mass index is 28.89 kg/m as calculated from the following:   Height as of 10/10/23: 5' 6 (1.676 m).   Weight as of 11/29/23: 81.2 kg.     Code Status: Full  Family Communication: None at bedside  Disposition Plan: Status is: Inpatient Remains inpatient appropriate because: Level of care      Consultants: None  Procedures: None  Antimicrobials: None  DVT prophylaxis: SCDs   Objective: Vitals:   12/31/23 0951 12/31/23 0953 12/31/23 0958 12/31/23 1100  BP: 125/86 125/86  121/80  Pulse: (!) 109 (!) 122  (!) 111  Resp: 12   (!) 22  Temp:   98.5 F (36.9 C) 98.4 F (36.9 C)  TempSrc:   Oral Oral  SpO2: 100%   100%    Intake/Output Summary (Last 24 hours) at 12/31/2023 1220 Last data filed at 12/31/2023 0459 Gross per 24 hour  Intake 1400 ml  Output --  Net 1400 ml   There were no vitals filed for this visit.  Exam:  General: NAD, generalized tremor Cardiovascular: S1, S2 present Respiratory: CTAB Abdomen: Soft, nontender, nondistended, bowel sounds present Musculoskeletal: No bilateral pedal edema noted Skin: Normal Psychiatry: Fair mood   Data Reviewed: CBC: Recent Labs  Lab 12/30/23 1647 12/31/23 0415  WBC 10.8* 7.3  HGB 12.7 10.7*  HCT 38.6 32.3*  MCV 85.2 86.1  PLT 178 113*   Basic  Metabolic Panel: Recent Labs  Lab 12/30/23 1647 12/30/23 2046 12/31/23 0047 12/31/23 0415  NA 133*  --   --  135  K 3.3*  --   --  4.2  CL 92*  --   --  102  CO2 14*  --   --  22  GLUCOSE 114*  --   --  104*  BUN 6  --   --  5*  CREATININE 1.49*  --   --  1.25*  CALCIUM 10.0  --   --  9.3  MG   --  1.7  --  1.8  PHOS  --   --  <1.0* <1.0*   GFR: CrCl cannot be calculated (Unknown ideal weight.). Liver Function Tests: Recent Labs  Lab 12/30/23 1647 12/31/23 0415  AST 78* 63*  ALT 35 25  ALKPHOS 73 57  BILITOT 2.2* 1.3*  PROT 8.7* 6.8  ALBUMIN 4.8 3.6   Recent Labs  Lab 12/30/23 1647  LIPASE 31   Recent Labs  Lab 12/31/23 0047  AMMONIA 32   Coagulation Profile: Recent Labs  Lab 12/31/23 0047  INR 1.0   Cardiac Enzymes: Recent Labs  Lab 12/31/23 0047  CKTOTAL 510*   BNP (last 3 results) No results for input(s): PROBNP in the last 8760 hours. HbA1C: No results for input(s): HGBA1C in the last 72 hours. CBG: No results for input(s): GLUCAP in the last 168 hours. Lipid Profile: No results for input(s): CHOL, HDL, LDLCALC, TRIG, CHOLHDL, LDLDIRECT in the last 72 hours. Thyroid  Function Tests: Recent Labs    12/30/23 2046 12/31/23 0047  TSH 1.496  --   FREET4  --  0.56*   Anemia Panel: No results for input(s): VITAMINB12, FOLATE, FERRITIN, TIBC, IRON, RETICCTPCT in the last 72 hours. Urine analysis:    Component Value Date/Time   COLORURINE YELLOW 10/10/2023 1518   APPEARANCEUR CLOUDY (A) 10/10/2023 1518   LABSPEC 1.011 10/10/2023 1518   PHURINE 6.0 10/10/2023 1518   GLUCOSEU NEGATIVE 10/10/2023 1518   HGBUR SMALL (A) 10/10/2023 1518   BILIRUBINUR NEGATIVE 10/10/2023 1518   KETONESUR 5 (A) 10/10/2023 1518   PROTEINUR NEGATIVE 10/10/2023 1518   UROBILINOGEN 0.2 11/14/2014 1755   NITRITE NEGATIVE 10/10/2023 1518   LEUKOCYTESUR TRACE (A) 10/10/2023 1518   Sepsis Labs: @LABRCNTIP (procalcitonin:4,lacticidven:4)  ) Recent Results (from the past 240 hours)  Resp panel by RT-PCR (RSV, Flu A&B, Covid) Anterior Nasal Swab     Status: None   Collection Time: 12/30/23 10:36 PM   Specimen: Anterior Nasal Swab  Result Value Ref Range Status   SARS Coronavirus 2 by RT PCR NEGATIVE NEGATIVE Final   Influenza A by PCR  NEGATIVE NEGATIVE Final   Influenza B by PCR NEGATIVE NEGATIVE Final    Comment: (NOTE) The Xpert Xpress SARS-CoV-2/FLU/RSV plus assay is intended as an aid in the diagnosis of influenza from Nasopharyngeal swab specimens and should not be used as a sole basis for treatment. Nasal washings and aspirates are unacceptable for Xpert Xpress SARS-CoV-2/FLU/RSV testing.  Fact Sheet for Patients: BloggerCourse.com  Fact Sheet for Healthcare Providers: SeriousBroker.it  This test is not yet approved or cleared by the [place]  FDA and has been authorized for detection and/or diagnosis of SARS-CoV-2 by FDA under an Emergency Use Authorization (EUA). This EUA will remain in effect (meaning this test can be used) for the duration of the COVID-19 declaration under Section 564(b)(1) of the Act, 21 U.S.C. section 360bbb-3(b)(1), unless the authorization is terminated or revoked.  Resp Syncytial Virus by PCR NEGATIVE NEGATIVE Final    Comment: (NOTE) Fact Sheet for Patients: BloggerCourse.com  Fact Sheet for Healthcare Providers: SeriousBroker.it  This test is not yet approved or cleared by the [place]  FDA and has been authorized for detection and/or diagnosis of SARS-CoV-2 by FDA under an Emergency Use Authorization (EUA). This EUA will remain in effect (meaning this test can be used) for the duration of the COVID-19 declaration under Section 564(b)(1) of the Act, 21 U.S.C. section 360bbb-3(b)(1), unless the authorization is terminated or revoked.  Performed at [facility], [address], [place], [place] [address]       Studies: US  Abdomen Limited RUQ (LIVER/GB) Result Date: 12/31/2023 CLINICAL DATA:  Right upper quadrant pain EXAM: ULTRASOUND ABDOMEN LIMITED RIGHT UPPER QUADRANT COMPARISON:  CT from the previous day. FINDINGS: Gallbladder: No gallstones or wall  thickening visualized. No sonographic Murphy sign noted by sonographer. Common bile duct: Diameter: 2.3 mm. Liver: Diffusely increased in echogenicity consistent with fatty infiltration. Portal vein is patent on color Doppler imaging with normal direction of blood flow towards the liver. Other: None. IMPRESSION: Fatty liver. No gallbladder abnormality is noted. Electronically Signed   By: Oneil Devonshire M.D.   On: 12/31/2023 02:06   CT Angio Chest PE W and/or Wo Contrast Result Date: 12/30/2023 EXAM: CTA of the Chest with contrast for PE 12/30/2023 09:53:21 PM TECHNIQUE: CTA of the chest was performed after the administration of intravenous contrast. Multiplanar reformatted images are provided for review. MIP images are provided for review. Automated exposure control, iterative reconstruction, and/or weight based adjustment of the mA/kV was utilized to reduce the radiation dose to as low as reasonably achievable. COMPARISON: 07/27/2018 CLINICAL HISTORY: High suspicion for PE. Chest pain, alcohol withdrawal. FINDINGS: PULMONARY ARTERIES: Pulmonary arteries are adequately opacified for evaluation. No pulmonary embolism. Main pulmonary artery is normal in caliber. MEDIASTINUM: The heart and pericardium demonstrate no acute abnormality. There is no acute abnormality of the thoracic aorta. LYMPH NODES: No mediastinal, hilar or axillary lymphadenopathy. LUNGS AND PLEURA: The lungs are without acute process. No focal consolidation or pulmonary edema. No pleural effusion or pneumothorax. UPPER ABDOMEN: Severe hepatic steatosis. SOFT TISSUES AND BONES: No acute bone or soft tissue abnormality. IMPRESSION: 1. No pulmonary embolism. 2. Severe hepatic steatosis. Electronically signed by: Dorethia Molt MD 12/30/2023 10:01 PM EDT RP Workstation: [id number]   CT ABDOMEN PELVIS W CONTRAST Result Date: 12/30/2023 CLINICAL DATA:  RLQ abdominal pain and anorexia. Evaluate for appendicits EXAM: CT ABDOMEN AND PELVIS WITH CONTRAST  TECHNIQUE: Multidetector CT imaging of the abdomen and pelvis was performed using the standard protocol following bolus administration of intravenous contrast. RADIATION DOSE REDUCTION: This exam was performed according to the departmental dose-optimization program which includes automated exposure control, adjustment of the mA and/or kV according to patient size and/or use of iterative reconstruction technique. CONTRAST:  75mL OMNIPAQUE  IOHEXOL  350 MG/ML SOLN COMPARISON:  CT abdomen pelvis 10/10/2023 FINDINGS: Lower chest: No acute abnormality. Hepatobiliary: The hepatic parenchyma is diffusely hypodense compared to the splenic parenchyma consistent with fatty infiltration. No focal liver abnormality. Question gallstones within the gallbladder lumen. No CT evidence of gallbladder wall thickening. No pericholecystic fluid. No biliary dilatation. Pancreas: Diffusely atrophic. No focal lesion. Otherwise normal pancreatic contour. No surrounding inflammatory changes. No main pancreatic ductal dilatation. Spleen: Normal in size without focal abnormality. Adrenals/Urinary Tract: No adrenal nodule bilaterally. Bilateral kidneys enhance symmetrically. Mild fullness of the right collecting system with no frank hydronephrosis. No hydronephrosis. No  hydroureter. No nephroureterolithiasis. The urinary bladder is unremarkable. Stomach/Bowel: Stomach is within normal limits. No evidence of bowel wall thickening or dilatation. Appendix appears normal. Vascular/Lymphatic: No abdominal aorta or iliac aneurysm. No abdominal, pelvic, or inguinal lymphadenopathy. Reproductive: Status post hysterectomy. No adnexal masses. Other: No intraperitoneal free fluid. No intraperitoneal free gas. No organized fluid collection. Musculoskeletal: No abdominal wall hernia or abnormality. No suspicious lytic or blastic osseous lesions. No acute displaced fracture. Multilevel degenerative changes of the spine. IMPRESSION: 1. Mild fullness of the  right collecting system with no frank hydronephrosis. This may reflect the residual of a recently passed calculus or reflect chronic changes of obstructive uropathy or reflux. 2. Question cholelithiasis. Recommend correlation with liver function tests ir consider right upper quadrant ultrasound for further evaluation. 3. Hepatic steatosis. 4. Status post hysterectomy. Electronically Signed   By: Morgane  Naveau M.D.   On: 12/30/2023 20:16   DG Chest Port 1 View Result Date: 12/30/2023 CLINICAL DATA:  Chest pain EXAM: PORTABLE CHEST 1 VIEW COMPARISON:  Chest x-ray 07/12/2023 FINDINGS: The heart size and mediastinal contours are within normal limits. Both lungs are clear. The visualized skeletal structures are unremarkable. IMPRESSION: No active disease. Electronically Signed   By: Greig Pique M.D.   On: 12/30/2023 18:30    Scheduled Meds:  folic acid   1 mg Oral Daily   multivitamin with minerals  1 tablet Oral Daily   pantoprazole  (PROTONIX ) IV  40 mg Intravenous Q12H   potassium & sodium phosphates   2 packet Oral TID AC & HS   thiamine   100 mg Oral Daily   Or   thiamine   100 mg Intravenous Daily    Continuous Infusions:  sodium chloride  100 mL/hr at 12/31/23 9062   sodium PHOSPHATE  IVPB (in mmol) 30 mmol (12/31/23 0949)     LOS: 1 day     Lebron JINNY Cage, MD [facility]  If 7PM-7AM, please contact night-coverage [email or web address] 12/31/2023, 12:20 PM

## 2023-12-31 NOTE — ED Notes (Signed)
 CCMD called to report transport to [facility] [phone]

## 2023-12-31 NOTE — Assessment & Plan Note (Signed)
 Given the right upper quadrant tenderness nausea and vomiting with slight LFT elevations and equivocal CT will obtain right upper quadrant ultrasound

## 2024-01-01 DIAGNOSIS — R1110 Vomiting, unspecified: Secondary | ICD-10-CM

## 2024-01-01 DIAGNOSIS — N179 Acute kidney failure, unspecified: Secondary | ICD-10-CM | POA: Diagnosis not present

## 2024-01-01 DIAGNOSIS — R1310 Dysphagia, unspecified: Secondary | ICD-10-CM

## 2024-01-01 LAB — COMPREHENSIVE METABOLIC PANEL WITH GFR
ALT: 25 U/L (ref 0–44)
AST: 49 U/L — ABNORMAL HIGH (ref 15–41)
Albumin: 3.1 g/dL — ABNORMAL LOW (ref 3.5–5.0)
Alkaline Phosphatase: 48 U/L (ref 38–126)
Anion gap: 10 (ref 5–15)
BUN: 5 mg/dL — ABNORMAL LOW (ref 6–20)
CO2: 23 mmol/L (ref 22–32)
Calcium: 8.4 mg/dL — ABNORMAL LOW (ref 8.9–10.3)
Chloride: 105 mmol/L (ref 98–111)
Creatinine, Ser: 0.93 mg/dL (ref 0.44–1.00)
GFR, Estimated: >60 mL/min (ref >60)
Glucose, Bld: 90 mg/dL (ref 70–99)
Potassium: 3 mmol/L — ABNORMAL LOW (ref 3.5–5.1)
Sodium: 138 mmol/L (ref 135–145)
Total Bilirubin: 1.1 mg/dL (ref 0.0–1.2)
Total Protein: 5.9 g/dL — ABNORMAL LOW (ref 6.5–8.1)

## 2024-01-01 LAB — CBC WITH DIFFERENTIAL/PLATELET
Abs Immature Granulocytes: 0.01 K/uL (ref 0.00–0.07)
Basophils Absolute: 0 K/uL (ref 0.0–0.1)
Basophils Relative: 0 %
Eosinophils Absolute: 0 K/uL (ref 0.0–0.5)
Eosinophils Relative: 0 %
HCT: 30.3 % — ABNORMAL LOW (ref 36.0–46.0)
Hemoglobin: 10 g/dL — ABNORMAL LOW (ref 12.0–15.0)
Immature Granulocytes: 0 %
Lymphocytes Relative: 26 %
Lymphs Abs: 1.2 K/uL (ref 0.7–4.0)
MCH: 28.2 pg (ref 26.0–34.0)
MCHC: 33 g/dL (ref 30.0–36.0)
MCV: 85.6 fL (ref 80.0–100.0)
Monocytes Absolute: 0.3 K/uL (ref 0.1–1.0)
Monocytes Relative: 6 %
Neutro Abs: 3.2 K/uL (ref 1.7–7.7)
Neutrophils Relative %: 68 %
Platelets: 85 K/uL — ABNORMAL LOW (ref 150–400)
RBC: 3.54 MIL/uL — ABNORMAL LOW (ref 3.87–5.11)
RDW: 14.7 % (ref 11.5–15.5)
WBC: 4.7 K/uL (ref 4.0–10.5)
nRBC: 0 % (ref 0.0–0.2)

## 2024-01-01 LAB — CK: Total CK: 209 U/L (ref 38–234)

## 2024-01-01 LAB — PHOSPHORUS: Phosphorus: 2.4 mg/dL — ABNORMAL LOW (ref 2.5–4.6)

## 2024-01-01 LAB — T3: T3, Total: 62 ng/dL — ABNORMAL LOW (ref 71–180)

## 2024-01-01 MED ORDER — GUAIFENESIN 100 MG/5ML PO LIQD
5.0000 mL | ORAL | Status: DC | PRN
  Administered 2024-01-02 – 2024-01-03 (×3): 5 mL via ORAL
  Filled 2024-01-01 (×4): qty 5

## 2024-01-01 MED ORDER — SUCRALFATE 1 GM/10ML PO SUSP
1.0000 g | Freq: Three times a day (TID) | ORAL | Status: DC
  Administered 2024-01-01 – 2024-01-04 (×8): 1 g via ORAL
  Filled 2024-01-01 (×8): qty 10

## 2024-01-01 MED ORDER — POTASSIUM CHLORIDE CRYS ER 20 MEQ PO TBCR
20.0000 meq | EXTENDED_RELEASE_TABLET | Freq: Once | ORAL | Status: AC
  Administered 2024-01-01: 20 meq via ORAL
  Filled 2024-01-01: qty 1

## 2024-01-01 MED ORDER — TRAZODONE HCL 50 MG PO TABS
50.0000 mg | ORAL_TABLET | Freq: Every day | ORAL | Status: DC
  Administered 2024-01-01 – 2024-01-03 (×3): 50 mg via ORAL
  Filled 2024-01-01 (×3): qty 1

## 2024-01-01 MED ORDER — FLUTICASONE PROPIONATE 50 MCG/ACT NA SUSP
2.0000 | Freq: Every day | NASAL | Status: DC
  Administered 2024-01-01 – 2024-01-03 (×3): 2 via NASAL
  Filled 2024-01-01: qty 16

## 2024-01-01 MED ORDER — MAGNESIUM SULFATE IN D5W 1-5 GM/100ML-% IV SOLN
1.0000 g | Freq: Once | INTRAVENOUS | Status: AC
  Administered 2024-01-01: 1 g via INTRAVENOUS
  Filled 2024-01-01: qty 100

## 2024-01-01 MED ORDER — SODIUM PHOSPHATES 45 MMOLE/15ML IV SOLN
30.0000 mmol | Freq: Once | INTRAVENOUS | Status: AC
  Administered 2024-01-01: 30 mmol via INTRAVENOUS
  Filled 2024-01-01: qty 10

## 2024-01-01 MED ORDER — POTASSIUM CHLORIDE CRYS ER 20 MEQ PO TBCR
40.0000 meq | EXTENDED_RELEASE_TABLET | Freq: Once | ORAL | Status: AC
  Administered 2024-01-01: 40 meq via ORAL
  Filled 2024-01-01: qty 2

## 2024-01-01 MED ORDER — LORATADINE 10 MG PO TABS
10.0000 mg | ORAL_TABLET | Freq: Every day | ORAL | Status: DC
  Administered 2024-01-01 – 2024-01-04 (×3): 10 mg via ORAL
  Filled 2024-01-01 (×4): qty 1

## 2024-01-01 NOTE — H&P (View-Only) (Signed)
 Consultation Note   Referring Provider:   [facility] PCP: Purcell Emil Schanz, MD Primary Gastroenterologist:    Rosario JAYSON Kidney, MD Reason for Consultation: Odynophagia DOA: 12/30/2023         Hospital Day: 3   ASSESSMENT    48 yo female admitted with Etoh abuse  /  Etoh withdrawal, acute chest pain / vomiting / odynophagia No acute findings on chest CTA.  High-sensitivity troponin elevated but with flat trend (22 >> 24 >> 12 ) and felt likely secondary to demand ischemia related to dehydration / hypotension.  Chest pain and odynophagia could be secondary to reflux esophagitis (exacerbated by recent vomiting).  She could have candida esophagitis as suspected endoscopically on her last EGD in 2022 though not seen esophageal biopsies.   Hstory of GERD Not taking acid blockers at home other than may be once a month for abdominal pain rather than reflux symptoms   History of mild chronic gastritis ( non H.pylori related) Takes occasional PPI for abdominal pain  AKI  / mild rhabdomyolysis( POA)  AKI resolved  Hypokalemia /hypophosphatemia Improving with electrolyte repletion.  Today potassium still low at 3, phosphorus 2.4.   Etoh abuse / Elevated liver chemistries ( mixed pattern). Severe hepatic steatosis (on CT angio) MDF < 32 Mildly elevated liver enzymes in pattern consistent with EtOH. Tbili elevated at 2.2.    History of colon polyps ( 2024) Up to date on surveillance colonoscopy    Principal Problem:   AKI (acute kidney injury) (HCC) Active Problems:   Hypokalemia   Tachycardia   GERD (gastroesophageal reflux disease)   Alcohol abuse with other alcohol-induced disorder (HCC)   Dehydration   Chest pain   RUQ abdominal tenderness    PLAN:   -CIWA protocol is in place.  - Continue twice daily IV pantoprazole  - Continue as needed Zofran  - Trial of Carafate  AC and at bedtime - Will consider repeating EGD if  symptoms not improving.   HPI   Patient admitted  with chest pain, vomiting, Etoh withdrawal.  She has been vomiting 2-3 times a day over the last few days while trying to stop drinking alcohol.  Along with the vomiting she has also had intermittent non-radiating mid chest pain  and has pain in throat and chest when taking PO.  She reports a 10 pound weight loss over the last few days.  She has not recently taken any antibiotics, or used any steroids ( but flonase  on home med list) . No hematemesis. No blood in stools or melena  `Pertinent GI Studies   Nov 2024 Screening colonoscopy - The examined portion of the ileum was normal. - One 5 mm polyp in the cecum, removed with a cold snare. Resected and retrieved. - Non-bleeding internal hemorrhoids. 1. Surgical [P], colon, cecum, polyp (1) :       -  SESSILE SERRATED LESION WITH ACTIVE INFLAMMATION AND REACTIVE/REPARATIVE       CHANGE, NEGATIVE FOR DYSPLASIA.    Dec 2022 EGD for dysphagia / abdominal pain and heartburn - White nummular lesions in esophageal mucosa. Biopsied. - Erythematous mucosa in the antrum. Biopsied. - Normal examined duodenum. Biopsied.  Surgical [P], duodenum TANGENTIALLY SECTIONED FRAGMENTS OF DUODENAL MUCOSA WITH  APPARENTLY NORMAL VILLOUS AND CRYPT ARCHITECTURE. MILD INFLAMMATION IS SEEN BUT NO INCREASE IN INTRAEPITHELIAL LYMPHOCYTES IS NOTED. NO DYSPLASIA OR MALIGNANCY IS SEEN. 2. Surgical [P], gastric antrum and gastric body MILD CHRONIC GASTRITIS. NO SIGNIFICANT ACTIVITY, DYSPLASIA OR MALIGNANCY IS SEEN. ALCIAN BLUE STAIN WITH APPROPRIATE CONTROLS DOES NOT HIGHLIGHT ANY INTESTINAL METAPLASIA. IMMUNOHISTOCHEMISTRY WITH APPROPRIATE CONTROLS FOR SINGLE ANTIBODY FOR H. PYLORI IS NEGATIVE FOR H. PYLORI ORGANISMS. 3. Surgical [P], esophagus FRAGMENTS OF SQUAMOUS MUCOSA WITH NO SIGNIFICANT INFLAMMATION, BASAL CELL HYPERPLASIA, DYSPLASIA OR MALIGNANCY SEEN. NO EOSINOPHILS ARE SEEN.   Labs and Imaging:  Recent  Labs    12/30/23 1647 12/31/23 0415 01/01/24 0348  PROT 8.7* 6.8 5.9*  ALBUMIN 4.8 3.6 3.1*  AST 78* 63* 49*  ALT 35 25 25  ALKPHOS 73 57 48  BILITOT 2.2* 1.3* 1.1   Recent Labs    12/30/23 1647 12/31/23 0415 01/01/24 0348  WBC 10.8* 7.3 4.7  HGB 12.7 10.7* 10.0*  HCT 38.6 32.3* 30.3*  MCV 85.2 86.1 85.6  PLT 178 113* 85*   Recent Labs    12/30/23 1647 12/31/23 0415 01/01/24 0348  NA 133* 135 138  K 3.3* 4.2 3.0*  CL 92* 102 105  CO2 14* 22 23  GLUCOSE 114* 104* 90  BUN 6 5* 5*  CREATININE 1.49* 1.25* 0.93  CALCIUM 10.0 9.3 8.4*     ECHOCARDIOGRAM COMPLETE    ECHOCARDIOGRAM REPORT       Patient Name:   [name] Date of Exam: 12/31/2023 Medical Rec #:  [id number]        Height:       66.0 in Accession #:    [id number]       Weight:       179.0 lb Date of Birth:  1975-06-19         BSA:          1.908 m Patient Age:    48 years         BP:           125/86 mmHg Patient Gender: F                HR:           104 bpm. Exam Location:  Inpatient  Procedure: 2D Echo, Cardiac Doppler and Color Doppler (Both Spectral and Color            Flow Doppler were utilized during procedure).  Indications:    Chest Pain R07.9   History:        Patient has no prior history of Echocardiogram examinations.   Sonographer:    Jayson Gaskins Referring Phys: [id number] ANASTASSIA DOUTOVA  IMPRESSIONS   1. Left ventricular ejection fraction, by estimation, is 60 to 65%. The left ventricle has normal function. The left ventricle has no regional wall motion abnormalities. Left ventricular diastolic parameters were normal.  2. Right ventricular systolic function is normal. The right ventricular size is normal.  3. The mitral valve is normal in structure. No evidence of mitral valve regurgitation. No evidence of mitral stenosis.  4. The aortic valve is normal in structure. Aortic valve regurgitation is not visualized. No aortic stenosis is present.  5. The inferior vena cava is  normal in size with greater than 50% respiratory variability, suggesting right atrial pressure of 3 mmHg.  FINDINGS  Left Ventricle: Left ventricular ejection fraction, by estimation, is 60 to 65%. The left ventricle has normal function. The left ventricle has no  regional wall motion abnormalities. The left ventricular internal cavity size was normal in size. There is  no left ventricular hypertrophy. Left ventricular diastolic parameters were normal.  Right Ventricle: The right ventricular size is normal. No increase in right ventricular wall thickness. Right ventricular systolic function is normal.  Left Atrium: Left atrial size was normal in size.  Right Atrium: Right atrial size was normal in size.  Pericardium: There is no evidence of pericardial effusion.  Mitral Valve: The mitral valve is normal in structure. No evidence of mitral valve regurgitation. No evidence of mitral valve stenosis.  Tricuspid Valve: The tricuspid valve is normal in structure. Tricuspid valve regurgitation is not demonstrated. No evidence of tricuspid stenosis.  Aortic Valve: The aortic valve is normal in structure. Aortic valve regurgitation is not visualized. No aortic stenosis is present. Aortic valve mean gradient measures 4.0 mmHg. Aortic valve peak gradient measures 6.2 mmHg. Aortic valve area, by VTI  measures 2.56 cm.  Pulmonic Valve: The pulmonic valve was normal in structure. Pulmonic valve regurgitation is not visualized. No evidence of pulmonic stenosis.  Aorta: The aortic root is normal in size and structure.  Venous: The inferior vena cava is normal in size with greater than 50% respiratory variability, suggesting right atrial pressure of 3 mmHg.  IAS/Shunts: No atrial level shunt detected by color flow Doppler.    LEFT VENTRICLE PLAX 2D LVIDd:         4.50 cm   Diastology LVIDs:         3.20 cm   LV e' medial:    8.92 cm/s LV PW:         1.00 cm   LV E/e' medial:  7.6 LV IVS:         0.80 cm   LV e' lateral:   12.40 cm/s LVOT diam:     1.90 cm   LV E/e' lateral: 5.5 LV SV:         56 LV SV Index:   29 LVOT Area:     2.84 cm    RIGHT VENTRICLE RV S prime:     18.10 cm/s TAPSE (M-mode): 3.5 cm  LEFT ATRIUM             Index        RIGHT ATRIUM           Index LA Vol (A2C):   34.8 ml 18.24 ml/m  RA Area:     12.00 cm LA Vol (A4C):   24.5 ml 12.84 ml/m  RA Volume:   23.40 ml  12.27 ml/m LA Biplane Vol: 30.1 ml 15.78 ml/m  AORTIC VALVE AV Area (Vmax):    2.68 cm AV Area (Vmean):   2.44 cm AV Area (VTI):     2.56 cm AV Vmax:           124.00 cm/s AV Vmean:          95.700 cm/s AV VTI:            0.217 m AV Peak Grad:      6.2 mmHg AV Mean Grad:      4.0 mmHg LVOT Vmax:         117.00 cm/s LVOT Vmean:        82.500 cm/s LVOT VTI:          0.196 m LVOT/AV VTI ratio: 0.90   AORTA Ao Root diam: 2.90 cm  MITRAL VALVE MV Area (PHT): 6.71 cm    SHUNTS MV  Decel Time: 113 msec    Systemic VTI:  0.20 m MV E velocity: 67.60 cm/s  Systemic Diam: 1.90 cm MV A velocity: 88.00 cm/s MV E/A ratio:  0.77  Aditya Sabharwal Electronically signed by Ria Commander Signature Date/Time: 12/31/2023/5:34:09 PM      Final   US  Abdomen Limited RUQ (LIVER/GB) CLINICAL DATA:  Right upper quadrant pain  EXAM: ULTRASOUND ABDOMEN LIMITED RIGHT UPPER QUADRANT  COMPARISON:  CT from the previous day.  FINDINGS: Gallbladder:  No gallstones or wall thickening visualized. No sonographic Murphy sign noted by sonographer.  Common bile duct:  Diameter: 2.3 mm.  Liver:  Diffusely increased in echogenicity consistent with fatty infiltration. Portal vein is patent on color Doppler imaging with normal direction of blood flow towards the liver.  Other: None.  IMPRESSION: Fatty liver.  No gallbladder abnormality is noted.  Electronically Signed   By: Oneil Devonshire M.D.   On: 12/31/2023 02:06     Past Medical History:  Diagnosis Date   Acute pancreatitis     Allergy    SEASONAL   Anemia    Anxiety    A LITTLE   Depression    Gastritis    GERD (gastroesophageal reflux disease)    tums as needed   Hemoperitoneum 12/25/2010   Nausea and vomiting 06/17/2019   Substance abuse (HCC)    ETOH ABUSE   SVD (spontaneous vaginal delivery)    x 1   Toothache 01/29/2013    Past Surgical History:  Procedure Laterality Date   BILATERAL SALPINGECTOMY N/A 06/19/2013   Procedure: BILATERAL SALPINGECTOMY;  Surgeon: Elveria Mungo, MD;  Location: [facility];  Service: Gynecology;  Laterality: N/A;   DILITATION & CURRETTAGE/HYSTROSCOPY WITH NOVASURE ABLATION N/A 04/04/2013   Procedure: DILATATION & CURETTAGE/HYSTEROSCOPY WITH Attempted NOVASURE ABLATION ;  Surgeon: Elveria Mungo, MD;  Location: [facility];  Service: Gynecology;  Laterality: N/A;   HYSTEROSCOPY N/A 05/26/2013   Procedure: UNSUCCESSFUL HYSTEROSCOPY WITH HYDROTHERMAL ABLATION;  Surgeon: Elveria Mungo, MD;  Location: [facility];  Service: Gynecology;  Laterality: N/A;   LAPAROSCOPY  12/25/2010   Procedure: LAPAROSCOPY OPERATIVE;  Surgeon: Olam DELENA Mill, MD;  Location: [facility];  Service: Gynecology;  Laterality: N/A;  Operative laparoscopy /cauterization of right hemorrhagic cyst wall. Removal of belly rings.   LAPAROSCOPY FOR ECTOPIC PREGNANCY     NOVASURE ABLATION N/A 05/26/2013   Procedure: UNSUCCESSFUL NOVASURE ABLATION;  Surgeon: Elveria Mungo, MD;  Location: [facility];  Service: Gynecology;  Laterality: N/A;   uterine biopsy     VAGINAL HYSTERECTOMY N/A 06/19/2013   Procedure:  TOTAL VAGINAL HYSTERECTOMY with bilateral salpingectomy;  Surgeon: Elveria Mungo, MD;  Location: [facility];  Service: Gynecology;  Laterality: N/A;    Family History  Problem Relation Age of Onset   Drug abuse Mother    Arthritis Maternal Aunt    Cancer Maternal Aunt    Drug abuse Maternal Aunt    Arthritis Maternal Uncle    Alcohol abuse Maternal Uncle    Cancer Maternal Uncle     Drug abuse Maternal Uncle    Cancer Maternal Grandmother    Alcohol abuse Maternal Grandfather    Cancer Maternal Grandfather    Colon cancer Neg Hx    Colon polyps Neg Hx    Crohn's disease Neg Hx    Esophageal cancer Neg Hx    Rectal cancer Neg Hx    Stomach cancer Neg Hx    Ulcerative colitis Neg Hx     Prior to Admission medications  Medication Sig Start Date End Date Taking? Authorizing Provider  Azelastine  HCl 137 MCG/SPRAY SOLN PLACE 2 SPRAYS INTO BOTH NOSTRILS 2 (TWO) TIMES DAILY. USE IN EACH NOSTRIL AS DIRECTED 12/03/23  Yes [name], Emil Schanz, MD  FLUoxetine  (PROZAC ) 20 MG capsule Take 1 capsule (20 mg total) by mouth daily. 11/29/23  Yes Arfeen, Leni DASEN, MD  fluticasone  (FLONASE ) 50 MCG/ACT nasal spray Place 1 spray into both nostrils daily as needed for allergies or rhinitis.   Yes [provider]  Multiple Vitamin (MULTIVITAMINS PO) Take 1 tablet by mouth daily.   Yes [provider]  naltrexone  (DEPADE) 50 MG tablet Take 1 tablet (50 mg total) by mouth daily. 11/29/23  Yes Arfeen, Leni DASEN, MD  traZODone  (DESYREL ) 50 MG tablet Take 1-2 tablets (50-100 mg total) by mouth at bedtime. 11/29/23  Yes Arfeen, Leni DASEN, MD  LORazepam  (ATIVAN ) 1 MG tablet Take 1 tablet (1 mg total) by mouth 3 (three) times daily as needed for anxiety. 10/10/23   Zackowski, Jaydalee Bardwell, MD  famotidine  (PEPCID ) 20 MG tablet Take 1 tablet (20 mg total) by mouth daily. Patient not taking: Reported on 10/28/2019 05/19/19 04/06/20  Dean Clarity, MD    Current Facility-Administered Medications  Medication Dose Route Frequency Provider Last Rate Last Admin   alum & mag hydroxide-simeth (MAALOX/MYLANTA) 200-200-20 MG/5ML suspension 30 mL  30 mL Oral Q6H PRN Ezenduka, Nkeiruka J, MD       FLUoxetine  (PROZAC ) capsule 20 mg  20 mg Oral Daily Ezenduka, Nkeiruka J, MD   20 mg at 12/31/23 1426   fluticasone  (FLONASE ) 50 MCG/ACT nasal spray 2 spray  2 spray Each Nare Daily Singh, Prashant K, MD        folic acid  (FOLVITE ) tablet 1 mg  1 mg Oral Daily Doutova, Anastassia, MD   1 mg at 12/31/23 9063   guaiFENesin  (MUCINEX ) 12 hr tablet 600 mg  600 mg Oral BID Sundil, Subrina, MD   600 mg at 12/31/23 2348   loratadine  (CLARITIN ) tablet 10 mg  10 mg Oral Daily Singh, Prashant K, MD       LORazepam  (ATIVAN ) tablet 1-4 mg  1-4 mg Oral Q1H PRN Doutova, Anastassia, MD   2 mg at 12/31/23 2204   Or   LORazepam  (ATIVAN ) injection 1-4 mg  1-4 mg Intravenous Q1H PRN Doutova, Anastassia, MD       magnesium  sulfate IVPB 1 g 100 mL  1 g Intravenous Once Singh, Prashant K, MD       menthol  (CEPACOL) lozenge 3 mg  1 lozenge Oral PRN Sundil, Subrina, MD   3 mg at 12/31/23 2348   multivitamin with minerals tablet 1 tablet  1 tablet Oral Daily Doutova, Anastassia, MD   1 tablet at 12/31/23 9063   ondansetron  (ZOFRAN ) tablet 4 mg  4 mg Oral Q6H PRN Doutova, Anastassia, MD   4 mg at 12/31/23 1020   Or   ondansetron  (ZOFRAN ) injection 4 mg  4 mg Intravenous Q6H PRN Doutova, Anastassia, MD       pantoprazole  (PROTONIX ) injection 40 mg  40 mg Intravenous Q12H Doutova, Anastassia, MD   40 mg at 12/31/23 2204   potassium chloride  SA (KLOR-CON  M) CR tablet 20 mEq  20 mEq Oral Once Singh, Prashant K, MD       potassium chloride  SA (KLOR-CON  M) CR tablet 40 mEq  40 mEq Oral Once Singh, Prashant K, MD       sodium phosphate  30 mmol in sodium chloride  0.9 %  250 mL infusion  30 mmol Intravenous Once Dennise Lavada POUR, MD 43 mL/hr at 01/01/24 0634 30 mmol at 01/01/24 9365   thiamine  (VITAMIN B1) tablet 100 mg  100 mg Oral Daily Doutova, Anastassia, MD   100 mg at 12/31/23 9063   Or   thiamine  (VITAMIN B1) injection 100 mg  100 mg Intravenous Daily Doutova, Anastassia, MD        Allergies as of 12/30/2023 - Review Complete 12/30/2023  Allergen Reaction Noted   Fluconazole  Hives 03/18/2021   Hydrocodone  Nausea And Vomiting 10/22/2020    Social History   Socioeconomic History   Marital status: Divorced    Spouse name:  Not on file   Number of children: Not on file   Years of education: Not on file   Highest education level: Some college, no degree  Occupational History   Not on file  Tobacco Use   Smoking status: Never   Smokeless tobacco: Never  Vaping Use   Vaping status: Never Used  Substance and Sexual Activity   Alcohol use: Not Currently    Comment: STOPPED 6 MONTHS AGO   Drug use: Not Currently   Sexual activity: Yes    Partners: Male    Birth control/protection: Surgical, Condom  Other Topics Concern   Not on file  Social History Narrative   Pt lives in [place] with her son.  She is unemployed.  She is not followed by a psychiatrist.   Social Drivers of Health   Financial Resource Strain: Medium Risk (08/06/2023)   Overall Financial Resource Strain (CARDIA)    Difficulty of Paying Living Expenses: Somewhat hard  Food Insecurity: No Food Insecurity (12/31/2023)   Hunger Vital Sign    Worried About Running Out of Food in the Last Year: Never true    Ran Out of Food in the Last Year: Never true  Transportation Needs: No Transportation Needs (12/31/2023)   PRAPARE - Administrator, Civil Service (Medical): No    Lack of Transportation (Non-Medical): No  Physical Activity: Insufficiently Active (08/06/2023)   Exercise Vital Sign    Days of Exercise per Week: 2 days    Minutes of Exercise per Session: 50 min  Stress: Stress Concern Present (08/06/2023)   [employer] of Occupational Health - Occupational Stress Questionnaire    Feeling of Stress : To some extent  Social Connections: Unknown (12/31/2023)   Social Connection and Isolation Panel    Frequency of Communication with Friends and Family: Never    Frequency of Social Gatherings with Friends and Family: Once a week    Attends Religious Services: More than 4 times per year    Active Member of [employer] or Organizations: No    Attends Banker Meetings: Never    Marital Status: Not on file  Intimate  Partner Violence: Not At Risk (12/31/2023)   Humiliation, Afraid, Rape, and Kick questionnaire    Fear of Current or Ex-Partner: No    Emotionally Abused: No    Physically Abused: No    Sexually Abused: No     Code Status   Code Status: Full Code  Review of Systems: All systems reviewed and negative except where noted in HPI.  Physical Exam: Vital signs in last 24 hours: Temp:  [97.6 F (36.4 C)-98.6 F (37 C)] 97.6 F (36.4 C) (09/16 0040) Pulse Rate:  [98-122] 101 (09/16 0040) Resp:  [12-22] 17 (09/16 0040) BP: (90-133)/(61-93) 109/80 (09/16 0040) SpO2:  [94 %-100 %] 94 % (  09/16 0040) Last BM Date : 12/31/23  General:  Pleasant female in NAD Psych:  Cooperative. Normal mood and affect Eyes: Pupils equal Ears:  Normal auditory acuity Nose: No deformity, discharge or lesions Neck:  Supple, no masses felt Lungs:  Clear to auscultation.  Heart:  Regular rate, regular rhythm.  Abdomen:  Soft, nondistended, nontender, active bowel sounds, no masses felt Rectal :  Deferred Msk: Symmetrical without gross deformities.  No chest wall tenderness to palpation.   Neurologic:  Alert, oriented, grossly normal neurologically Extremities : No edema Skin:  Intact without significant lesions.    Intake/Output from previous day: 09/15 0701 - 09/16 0700 In: 240 [P.O.:240] Out: -  Intake/Output this shift:  No intake/output data recorded.   Vina Dasen, NP-C   01/01/2024, 9:34 AM  --------------------------------------------------------------------  I have taken a history, reviewed the chart and examined the patient. I performed a substantive portion of this encounter, including complete performance of at least one of the key components, in conjunction with the APP. I agree with the APP's note, impression and recommendations   48 year old female with history of chronic alcohol abuse, admitted with symptoms of chest pain/odynophagia, weight loss, p.o. intolerance.  Patient  states that she is unable to eat solid food and has significant pain when swallowing. Low suspicion for high-grade esophageal stricture or significant esophageal dysmotility.  Suspect symptoms are functional, possibly related to alcohol withdrawal, but agreed to proceed with upper endoscopy to exclude significant esophagitis/stricture while admitted.  We also discussed her alcohol use disorder and liver disease.  We discussed that her labs are concerning for possible underlying cirrhosis given the elevated bilirubin and thrombocytopenia.  Would recommend outpatient elastography for further evaluation.  She understands that further alcohol consumption increases her risk for cirrhosis (if she does not already have it) and that this is not reversible.  Kenzel Ruesch E. Stacia, MD [facility]  Moderate complex medical decision making (this includes chart review, review of results, face-to-face time used for counseling as well as treatment plan and follow-up. The patient was provided an opportunity to ask questions and all were answered. The patient agreed with the plan and demonstrated an understanding of the instructions

## 2024-01-01 NOTE — Progress Notes (Signed)
 PROGRESS NOTE                                                                                                                                                                                                             Patient Demographics:    Laronica Bhagat, is a 48 y.o. female, DOB - 07-Jun-1975, FMW:[id number]  Outpatient Primary MD for the patient is [name], [name], MD    LOS - 2  Admit date - 12/30/2023    Chief Complaint  Patient presents with   Chest Pain   Alcohol Intoxication       Brief Narrative (HPI from H&P)   48 y.o. female with medical history significant of alcohol abuse, GERD, anxiety. Presented with chest pain/indigestion, SOB, significant N/V, also reports she may be in alcohol withdrawal.  Last drink was on 12/29/2023. Patient reports that she has been drinking approximately fifth of hard liquor per day for the past 7 days. Denies having any withdrawal seizures.  In the ED, patient noted to be significantly tachycardic with generalized tremors, otherwise stable.    Labs showed K-->3.3, creatinine 1.49, bicarb 14, WBC 10.8, AST 78, ALT 35, T. bili 2.2, troponin 22--> 24. CTA chest with no evidence of PE, noted severe hepatic steatosis.  CT abdomen/pelvis noted for possible residual of a recently passed calculus, questionable cholelithiasis.  Right upper quadrant ultrasound showed no gallstones or wall thickening visualized, just fatty liver.  Patient admitted for further management.    Subjective:    Darrin Barrington today has, No headache, No chest pain, No abdominal pain - No Nausea, No new weakness tingling or numbness, some pressure in the left ear, ongoing problems swallowing food & liquids.   Assessment  & Plan :   Alcohol abuse/withdrawal Patient reports that she has been drinking approximately fifth of hard liquor per day for the past 7 days.  In mild DTs, counseled to quit, continue CIWA protocol and  monitor.   Ongoing odynophagia.  History of gastritis, GI consulted may require EGD.  Continue PPI.  Left ear fullness.  Likely blocked eustachian tube, Claritin  and Flonase , monitor.    AKI with hypokalemia, hypophosphatemia Hydrate, AKI improving electrolytes replaced   Mild transaminitis AST elevated, T. bili elevated at 1.3, right upper quadrant ultrasound shows fatty liver, to abstain from alcohol.  GI consulted.  Question  if she has underlying cirrhosis as she already has thrombocytopenia   Thrombocytopenia/normocytic anemia Likely 2/2 alcohol abuse Daily CBC   Mild rhabdomyolysis Hydrated   Mildly elevated troponin Mild from demand ischemia caused by dehydration and hypotension, trend is flat and in non-ACS pattern, chest pain-free stable echocardiogram.   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  No PE - 1. Mild fullness of the right collecting system with no frank hydronephrosis. This may reflect the residual of a recently passed calculus or reflect chronic changes of obstructive uropathy or reflux. 2. Question cholelithiasis. Recommend correlation with liver function tests ir consider right upper quadrant ultrasound for further evaluation. 3. Hepatic steatosis. 4. Status post hysterectomy.  Right upper quadrant ultrasound.  Fatty liver  Echocardiogram - 1. Left ventricular ejection fraction, by estimation, is 60 to 65%. The left ventricle has normal function. The left ventricle has no regional wall motion abnormalities. Left ventricular diastolic parameters were normal.  2. Right ventricular systolic function is normal. The right ventricular size is normal.  3. The mitral valve is normal in structure.  No evidence of mitral valve regurgitation. No evidence of mitral stenosis.  4. The aortic valve is normal in structure. Aortic valve regurgitation is not visualized. No aortic stenosis is present.  5. The inferior vena cava is normal in size with greater than 50% respiratory variability, suggesting right atrial pressure of 3 mmHg.       Disposition Plan  :    Status is: Inpatient  DVT Prophylaxis  :    SCDs Start: 12/31/23 0110   Lab Results  Component Value Date   PLT 85 (L) 01/01/2024    Diet :  Diet Order             Diet Heart Room service appropriate? Yes; Fluid consistency: Thin  Diet effective now                    Inpatient Medications  Scheduled Meds:  FLUoxetine   20 mg Oral Daily   fluticasone   2 spray Each Nare Daily   folic acid   1 mg Oral Daily   guaiFENesin   600 mg Oral BID   loratadine   10 mg Oral Daily   multivitamin with minerals  1 tablet Oral Daily   pantoprazole  (PROTONIX ) IV  40 mg Intravenous Q12H   potassium chloride   20 mEq Oral Once   potassium chloride   40 mEq Oral Once   thiamine   100 mg Oral Daily   Or   thiamine   100 mg Intravenous Daily   Continuous Infusions:  sodium chloride  100 mL/hr at 12/31/23 2208   magnesium  sulfate bolus IVPB     sodium PHOSPHATE  IVPB (in mmol) 30 mmol (01/01/24 0634)   PRN Meds:.alum & mag hydroxide-simeth, LORazepam  **OR** LORazepam , menthol , ondansetron  **OR** ondansetron  (ZOFRAN ) IV  Antibiotics  :    Anti-infectives (From admission, onward)    None         Objective:   Vitals:   12/31/23 2100 12/31/23 2208 12/31/23 2350 01/01/24 0040  BP: (!) 133/90 132/63 (!) 132/93 109/80  Pulse: 99 (!) 110 (!) 111 (!) 101  Resp:    17  Temp:    97.6 F (36.4 C)  TempSrc:    Oral  SpO2:    94%    Wt Readings from Last 3 Encounters:  11/29/23 81.2 kg  10/10/23 81.6 kg  08/30/23 81.6 kg     Intake/Output Summary (Last 24 hours) at 01/01/2024 0830 Last data filed at 12/31/2023 1430 Gross per  24 hour  Intake 240 ml  Output --  Net 240 ml     Physical Exam  Awake Alert, No new F.N deficits, Normal affect [place].AT,PERRAL Supple Neck, No JVD,   Symmetrical Chest wall movement, Good air movement bilaterally, CTAB RRR,No Gallops,Rubs or new Murmurs,  +ve B.Sounds, Abd Soft, No tenderness,   No Cyanosis, Clubbing or edema        Data Review:    Recent Labs  Lab 12/30/23 1647 12/31/23 0415 01/01/24 0348  WBC 10.8* 7.3 4.7  HGB 12.7 10.7* 10.0*  HCT 38.6 32.3* 30.3*  PLT 178 113* 85*  MCV 85.2 86.1 85.6  MCH 28.0 28.5 28.2  MCHC 32.9 33.1 33.0  RDW 14.6 14.7 14.7  LYMPHSABS  --   --  1.2  MONOABS  --   --  0.3  EOSABS  --   --  0.0  BASOSABS  --   --  0.0    Recent Labs  Lab 12/30/23 1647 12/30/23 2046 12/31/23 0047 12/31/23 0415 12/31/23 2045 01/01/24 0348  NA 133*  --   --  135  --  138  K 3.3*  --   --  4.2  --  3.0*  CL 92*  --   --  102  --  105  CO2 14*  --   --  22  --  23  ANIONGAP 27*  --   --  11  --  10  GLUCOSE 114*  --   --  104*  --  90  BUN 6  --   --  5*  --  5*  CREATININE 1.49*  --   --  1.25*  --  0.93  AST 78*  --   --  63*  --  49*  ALT 35  --   --  25  --  25  ALKPHOS 73  --   --  57  --  48  BILITOT 2.2*  --   --  1.3*  --  1.1  ALBUMIN 4.8  --   --  3.6  --  3.1*  PROCALCITON  --   --  <0.10  --   --   --   LATICACIDVEN  --   --  1.2  --   --   --   INR  --   --  1.0  --   --   --   TSH  --  1.496  --   --   --   --   AMMONIA  --   --  32  --   --   --   MG  --  1.7  --  1.8  --   --   PHOS  --   --  <1.0* <1.0* 1.9* 2.4*  CALCIUM 10.0  --   --  9.3  --  8.4*      Recent Labs  Lab 12/30/23 1647 12/30/23 2046 12/31/23 0047 12/31/23 0415 01/01/24 0348  PROCALCITON  --   --  <0.10  --   --  LATICACIDVEN  --   --  1.2  --   --   INR  --   --  1.0  --   --   TSH  --  1.496  --   --   --   AMMONIA  --   --  32  --   --   MG  --  1.7  --  1.8  --   CALCIUM 10.0  --   --  9.3 8.4*     --------------------------------------------------------------------------------------------------------------- Lab Results  Component Value Date   CHOL 190 08/07/2023   HDL 78.50 08/07/2023   LDLCALC 91 08/07/2023   TRIG 100.0 08/07/2023   CHOLHDL 2 08/07/2023    Lab Results  Component Value Date   HGBA1C 5.9 08/07/2023   Recent Labs    12/30/23 2046 12/31/23 0047  TSH 1.496  --   FREET4  --  0.56*   No results for input(s): VITAMINB12, FOLATE, FERRITIN, TIBC, IRON, RETICCTPCT in the last 72 hours. ------------------------------------------------------------------------------------------------------------------ Cardiac Enzymes No results for input(s): CKMB, TROPONINI, MYOGLOBIN in the last 168 hours.  Invalid input(s): CK  Micro Results Recent Results (from the past 240 hours)  Resp panel by RT-PCR (RSV, Flu A&B, Covid) Anterior Nasal Swab     Status: None   Collection Time: 12/30/23 10:36 PM   Specimen: Anterior Nasal Swab  Result Value Ref Range Status   SARS Coronavirus 2 by RT PCR NEGATIVE NEGATIVE Final   Influenza A by PCR NEGATIVE NEGATIVE Final   Influenza B by PCR NEGATIVE NEGATIVE Final    Comment: (NOTE) The Xpert Xpress SARS-CoV-2/FLU/RSV plus assay is intended as an aid in the diagnosis of influenza from Nasopharyngeal swab specimens and should not be used as a sole basis for treatment. Nasal washings and aspirates are unacceptable for Xpert Xpress SARS-CoV-2/FLU/RSV testing.  Fact Sheet for Patients: BloggerCourse.com  Fact Sheet for Healthcare Providers: SeriousBroker.it  This test is not yet approved or cleared by the [place]  FDA and has been authorized for detection and/or diagnosis of SARS-CoV-2 by FDA under an Emergency Use Authorization (EUA). This EUA will remain in effect (meaning this test can be used) for the duration of the COVID-19 declaration under Section  564(b)(1) of the Act, 21 U.S.C. section 360bbb-3(b)(1), unless the authorization is terminated or revoked.     Resp Syncytial Virus by PCR NEGATIVE NEGATIVE Final    Comment: (NOTE) Fact Sheet for Patients: BloggerCourse.com  Fact Sheet for Healthcare Providers: SeriousBroker.it  This test is not yet approved or cleared by the [place]  FDA and has been authorized for detection and/or diagnosis of SARS-CoV-2 by FDA under an Emergency Use Authorization (EUA). This EUA will remain in effect (meaning this test can be used) for the duration of the COVID-19 declaration under Section 564(b)(1) of the Act, 21 U.S.C. section 360bbb-3(b)(1), unless the authorization is terminated or revoked.  Performed at [facility], [address], [place], [place] [address]     Radiology Report ECHOCARDIOGRAM COMPLETE Result Date: 12/31/2023    ECHOCARDIOGRAM REPORT   Patient Name:   [facility] Date of Exam: 12/31/2023 Medical Rec #:  [id number]        Height:       66.0 in Accession #:    [id number]       Weight:       179.0 lb Date of Birth:  1975-09-21         BSA:  1.908 m Patient Age:    48 years         BP:           125/86 mmHg Patient Gender: F                HR:           104 bpm. Exam Location:  Inpatient Procedure: 2D Echo, Cardiac Doppler and Color Doppler (Both Spectral and Color            Flow Doppler were utilized during procedure). Indications:    Chest Pain R07.9  History:        Patient has no prior history of Echocardiogram examinations.  Sonographer:    Jayson Gaskins Referring Phys: [id number] ANASTASSIA DOUTOVA IMPRESSIONS  1. Left ventricular ejection fraction, by estimation, is 60 to 65%. The left ventricle has normal function. The left ventricle has no regional wall motion abnormalities. Left ventricular diastolic parameters were normal.  2. Right ventricular systolic function is normal. The right ventricular size is  normal.  3. The mitral valve is normal in structure. No evidence of mitral valve regurgitation. No evidence of mitral stenosis.  4. The aortic valve is normal in structure. Aortic valve regurgitation is not visualized. No aortic stenosis is present.  5. The inferior vena cava is normal in size with greater than 50% respiratory variability, suggesting right atrial pressure of 3 mmHg. FINDINGS  Left Ventricle: Left ventricular ejection fraction, by estimation, is 60 to 65%. The left ventricle has normal function. The left ventricle has no regional wall motion abnormalities. The left ventricular internal cavity size was normal in size. There is  no left ventricular hypertrophy. Left ventricular diastolic parameters were normal. Right Ventricle: The right ventricular size is normal. No increase in right ventricular wall thickness. Right ventricular systolic function is normal. Left Atrium: Left atrial size was normal in size. Right Atrium: Right atrial size was normal in size. Pericardium: There is no evidence of pericardial effusion. Mitral Valve: The mitral valve is normal in structure. No evidence of mitral valve regurgitation. No evidence of mitral valve stenosis. Tricuspid Valve: The tricuspid valve is normal in structure. Tricuspid valve regurgitation is not demonstrated. No evidence of tricuspid stenosis. Aortic Valve: The aortic valve is normal in structure. Aortic valve regurgitation is not visualized. No aortic stenosis is present. Aortic valve mean gradient measures 4.0 mmHg. Aortic valve peak gradient measures 6.2 mmHg. Aortic valve area, by VTI measures 2.56 cm. Pulmonic Valve: The pulmonic valve was normal in structure. Pulmonic valve regurgitation is not visualized. No evidence of pulmonic stenosis. Aorta: The aortic root is normal in size and structure. Venous: The inferior vena cava is normal in size with greater than 50% respiratory variability, suggesting right atrial pressure of 3 mmHg. IAS/Shunts:  No atrial level shunt detected by color flow Doppler.  LEFT VENTRICLE PLAX 2D LVIDd:         4.50 cm   Diastology LVIDs:         3.20 cm   LV e' medial:    8.92 cm/s LV PW:         1.00 cm   LV E/e' medial:  7.6 LV IVS:        0.80 cm   LV e' lateral:   12.40 cm/s LVOT diam:     1.90 cm   LV E/e' lateral: 5.5 LV SV:         56 LV SV Index:   29 LVOT Area:  2.84 cm  RIGHT VENTRICLE RV S prime:     18.10 cm/s TAPSE (M-mode): 3.5 cm LEFT ATRIUM             Index        RIGHT ATRIUM           Index LA Vol (A2C):   34.8 ml 18.24 ml/m  RA Area:     12.00 cm LA Vol (A4C):   24.5 ml 12.84 ml/m  RA Volume:   23.40 ml  12.27 ml/m LA Biplane Vol: 30.1 ml 15.78 ml/m  AORTIC VALVE AV Area (Vmax):    2.68 cm AV Area (Vmean):   2.44 cm AV Area (VTI):     2.56 cm AV Vmax:           124.00 cm/s AV Vmean:          95.700 cm/s AV VTI:            0.217 m AV Peak Grad:      6.2 mmHg AV Mean Grad:      4.0 mmHg LVOT Vmax:         117.00 cm/s LVOT Vmean:        82.500 cm/s LVOT VTI:          0.196 m LVOT/AV VTI ratio: 0.90  AORTA Ao Root diam: 2.90 cm MITRAL VALVE MV Area (PHT): 6.71 cm    SHUNTS MV Decel Time: 113 msec    Systemic VTI:  0.20 m MV E velocity: 67.60 cm/s  Systemic Diam: 1.90 cm MV A velocity: 88.00 cm/s MV E/A ratio:  0.77 Aditya Sabharwal Electronically signed by Ria Commander Signature Date/Time: 12/31/2023/5:34:09 PM    Final    US  Abdomen Limited RUQ (LIVER/GB) Result Date: 12/31/2023 CLINICAL DATA:  Right upper quadrant pain EXAM: ULTRASOUND ABDOMEN LIMITED RIGHT UPPER QUADRANT COMPARISON:  CT from the previous day. FINDINGS: Gallbladder: No gallstones or wall thickening visualized. No sonographic Murphy sign noted by sonographer. Common bile duct: Diameter: 2.3 mm. Liver: Diffusely increased in echogenicity consistent with fatty infiltration. Portal vein is patent on color Doppler imaging with normal direction of blood flow towards the liver. Other: None. IMPRESSION: Fatty liver. No gallbladder  abnormality is noted. Electronically Signed   By: Oneil Devonshire M.D.   On: 12/31/2023 02:06   CT Angio Chest PE W and/or Wo Contrast Result Date: 12/30/2023 EXAM: CTA of the Chest with contrast for PE 12/30/2023 09:53:21 PM TECHNIQUE: CTA of the chest was performed after the administration of intravenous contrast. Multiplanar reformatted images are provided for review. MIP images are provided for review. Automated exposure control, iterative reconstruction, and/or weight based adjustment of the mA/kV was utilized to reduce the radiation dose to as low as reasonably achievable. COMPARISON: 07/27/2018 CLINICAL HISTORY: High suspicion for PE. Chest pain, alcohol withdrawal. FINDINGS: PULMONARY ARTERIES: Pulmonary arteries are adequately opacified for evaluation. No pulmonary embolism. Main pulmonary artery is normal in caliber. MEDIASTINUM: The heart and pericardium demonstrate no acute abnormality. There is no acute abnormality of the thoracic aorta. LYMPH NODES: No mediastinal, hilar or axillary lymphadenopathy. LUNGS AND PLEURA: The lungs are without acute process. No focal consolidation or pulmonary edema. No pleural effusion or pneumothorax. UPPER ABDOMEN: Severe hepatic steatosis. SOFT TISSUES AND BONES: No acute bone or soft tissue abnormality. IMPRESSION: 1. No pulmonary embolism. 2. Severe hepatic steatosis. Electronically signed by: Dorethia Molt MD 12/30/2023 10:01 PM EDT RP Workstation: [id number]   CT ABDOMEN PELVIS W CONTRAST Result Date: 12/30/2023 CLINICAL DATA:  RLQ abdominal pain and anorexia. Evaluate for appendicits EXAM: CT ABDOMEN AND PELVIS WITH CONTRAST TECHNIQUE: Multidetector CT imaging of the abdomen and pelvis was performed using the standard protocol following bolus administration of intravenous contrast. RADIATION DOSE REDUCTION: This exam was performed according to the departmental dose-optimization program which includes automated exposure control, adjustment of the mA and/or kV  according to patient size and/or use of iterative reconstruction technique. CONTRAST:  75mL OMNIPAQUE  IOHEXOL  350 MG/ML SOLN COMPARISON:  CT abdomen pelvis 10/10/2023 FINDINGS: Lower chest: No acute abnormality. Hepatobiliary: The hepatic parenchyma is diffusely hypodense compared to the splenic parenchyma consistent with fatty infiltration. No focal liver abnormality. Question gallstones within the gallbladder lumen. No CT evidence of gallbladder wall thickening. No pericholecystic fluid. No biliary dilatation. Pancreas: Diffusely atrophic. No focal lesion. Otherwise normal pancreatic contour. No surrounding inflammatory changes. No main pancreatic ductal dilatation. Spleen: Normal in size without focal abnormality. Adrenals/Urinary Tract: No adrenal nodule bilaterally. Bilateral kidneys enhance symmetrically. Mild fullness of the right collecting system with no frank hydronephrosis. No hydronephrosis. No hydroureter. No nephroureterolithiasis. The urinary bladder is unremarkable. Stomach/Bowel: Stomach is within normal limits. No evidence of bowel wall thickening or dilatation. Appendix appears normal. Vascular/Lymphatic: No abdominal aorta or iliac aneurysm. No abdominal, pelvic, or inguinal lymphadenopathy. Reproductive: Status post hysterectomy. No adnexal masses. Other: No intraperitoneal free fluid. No intraperitoneal free gas. No organized fluid collection. Musculoskeletal: No abdominal wall hernia or abnormality. No suspicious lytic or blastic osseous lesions. No acute displaced fracture. Multilevel degenerative changes of the spine. IMPRESSION: 1. Mild fullness of the right collecting system with no frank hydronephrosis. This may reflect the residual of a recently passed calculus or reflect chronic changes of obstructive uropathy or reflux. 2. Question cholelithiasis. Recommend correlation with liver function tests ir consider right upper quadrant ultrasound for further evaluation. 3. Hepatic steatosis. 4.  Status post hysterectomy. Electronically Signed   By: Morgane  Naveau M.D.   On: 12/30/2023 20:16   DG Chest Port 1 View Result Date: 12/30/2023 CLINICAL DATA:  Chest pain EXAM: PORTABLE CHEST 1 VIEW COMPARISON:  Chest x-ray 07/12/2023 FINDINGS: The heart size and mediastinal contours are within normal limits. Both lungs are clear. The visualized skeletal structures are unremarkable. IMPRESSION: No active disease. Electronically Signed   By: Greig Pique M.D.   On: 12/30/2023 18:30     Signature  -   Lavada Stank M.D on 01/01/2024 at 8:30 AM   -  To page go to [email or web address]

## 2024-01-01 NOTE — Progress Notes (Addendum)
   01/01/24 1515  Mobility  Activity Ambulated independently  Level of Assistance Independent  Assistive Device Other (Comment) (IV Pole)  Distance Ambulated (ft) 400 ft  Activity Response Tolerated fair  Mobility Referral Yes  Mobility visit 1 Mobility  Mobility Specialist Start Time (ACUTE ONLY) 1515  Mobility Specialist Stop Time (ACUTE ONLY) 1530  Mobility Specialist Time Calculation (min) (ACUTE ONLY) 15 min   Mobility Specialist: Progress Note  During Mobility:               SpO2 97% RA Post-Mobility:    HR 113, SpO2 97% RA  Pt agreeable to mobility session - received standing in room. C/o chest pain, R ankle pain, and winded, VSS. Also c/o pain in chest when swallowing. Returned to EOB with all needs met - call bell within reach.  Virgle Boards, BS Mobility Specialist Please contact via SecureChat or  Rehab office at [phone].

## 2024-01-01 NOTE — Consult Note (Addendum)
 Consultation Note   Referring Provider:   [facility] PCP: Danielle Emil Schanz, MD Primary Gastroenterologist:    Danielle Danielle Kidney, MD Reason for Consultation: Odynophagia DOA: 12/30/2023         Hospital Day: 3   ASSESSMENT    48 yo female admitted with Etoh abuse  /  Etoh withdrawal, acute chest pain / vomiting / odynophagia No acute findings on chest CTA.  High-sensitivity troponin elevated but with flat trend (22 >> 24 >> 12 ) and felt likely secondary to demand ischemia related to dehydration / hypotension.  Chest pain and odynophagia could be secondary to reflux esophagitis (exacerbated by recent vomiting).  She could have candida esophagitis as suspected endoscopically on her last EGD in 2022 though not seen esophageal biopsies.   Hstory of GERD Not taking acid blockers at home other than may be once a month for abdominal pain rather than reflux symptoms   History of mild chronic gastritis ( non H.pylori related) Takes occasional PPI for abdominal pain  AKI  / mild rhabdomyolysis( POA)  AKI resolved  Hypokalemia /hypophosphatemia Improving with electrolyte repletion.  Today potassium still low at 3, phosphorus 2.4.   Etoh abuse / Elevated liver chemistries ( mixed pattern). Severe hepatic steatosis (on CT angio) MDF < 32 Mildly elevated liver enzymes in pattern consistent with EtOH. Tbili elevated at 2.2.    History of colon polyps ( 2024) Up to date on surveillance colonoscopy    Principal Problem:   AKI (acute Reilly injury) (HCC) Active Problems:   Hypokalemia   Tachycardia   GERD (gastroesophageal reflux disease)   Alcohol abuse with other alcohol-induced disorder (HCC)   Dehydration   Chest pain   RUQ abdominal tenderness    PLAN:   -CIWA protocol is in place.  - Continue twice daily IV pantoprazole  - Continue as needed Zofran  - Trial of Carafate  AC and at bedtime - Will consider repeating EGD if  symptoms not improving.   HPI   Patient admitted  with chest pain, vomiting, Etoh withdrawal.  She has been vomiting 2-3 times a day over the last few days while trying to stop drinking alcohol.  Along with the vomiting she has also had intermittent non-radiating mid chest pain  and has pain in throat and chest when taking PO.  She reports a 10 pound weight loss over the last few days.  She has not recently taken any antibiotics, or used any steroids ( but flonase  on home med list) . No hematemesis. No blood in stools or melena  `Pertinent GI Studies   Nov 2024 Screening colonoscopy - The examined portion of the ileum was normal. - One 5 mm polyp in the cecum, removed with a cold snare. Resected and retrieved. - Non-bleeding internal hemorrhoids. 1. Surgical [P], colon, cecum, polyp (1) :       -  SESSILE SERRATED LESION WITH ACTIVE INFLAMMATION AND REACTIVE/REPARATIVE       CHANGE, NEGATIVE FOR DYSPLASIA.    Dec 2022 EGD for dysphagia / abdominal pain and heartburn - White nummular lesions in esophageal mucosa. Biopsied. - Erythematous mucosa in the antrum. Biopsied. - Normal examined duodenum. Biopsied.  Surgical [P], duodenum TANGENTIALLY SECTIONED FRAGMENTS OF DUODENAL MUCOSA WITH  APPARENTLY NORMAL VILLOUS AND CRYPT ARCHITECTURE. MILD INFLAMMATION IS SEEN BUT NO INCREASE IN INTRAEPITHELIAL LYMPHOCYTES IS NOTED. NO DYSPLASIA OR MALIGNANCY IS SEEN. 2. Surgical [P], gastric antrum and gastric body MILD CHRONIC GASTRITIS. NO SIGNIFICANT ACTIVITY, DYSPLASIA OR MALIGNANCY IS SEEN. ALCIAN BLUE STAIN WITH APPROPRIATE CONTROLS DOES NOT HIGHLIGHT ANY INTESTINAL METAPLASIA. IMMUNOHISTOCHEMISTRY WITH APPROPRIATE CONTROLS FOR SINGLE ANTIBODY FOR H. PYLORI IS NEGATIVE FOR H. PYLORI ORGANISMS. 3. Surgical [P], esophagus FRAGMENTS OF SQUAMOUS MUCOSA WITH NO SIGNIFICANT INFLAMMATION, BASAL CELL HYPERPLASIA, DYSPLASIA OR MALIGNANCY SEEN. NO EOSINOPHILS ARE SEEN.   Labs and Imaging:  Recent  Labs    12/30/23 1647 12/31/23 0415 01/01/24 0348  PROT 8.7* 6.8 5.9*  ALBUMIN 4.8 3.6 3.1*  AST 78* 63* 49*  ALT 35 25 25  ALKPHOS 73 57 48  BILITOT 2.2* 1.3* 1.1   Recent Labs    12/30/23 1647 12/31/23 0415 01/01/24 0348  WBC 10.8* 7.3 4.7  HGB 12.7 10.7* 10.0*  HCT 38.6 32.3* 30.3*  MCV 85.2 86.1 85.6  PLT 178 113* 85*   Recent Labs    12/30/23 1647 12/31/23 0415 01/01/24 0348  NA 133* 135 138  K 3.3* 4.2 3.0*  CL 92* 102 105  CO2 14* 22 23  GLUCOSE 114* 104* 90  BUN 6 5* 5*  CREATININE 1.49* 1.25* 0.93  CALCIUM 10.0 9.3 8.4*     ECHOCARDIOGRAM COMPLETE    ECHOCARDIOGRAM REPORT       Patient Name:   Danielle Reilly Date of Exam: 12/31/2023 Medical Rec #:  [id number]        Height:       66.0 in Accession #:    [id number]       Weight:       179.0 lb Date of Birth:  1975-06-19         BSA:          1.908 m Patient Age:    48 years         BP:           125/86 mmHg Patient Gender: F                HR:           104 bpm. Exam Location:  Inpatient  Procedure: 2D Echo, Cardiac Doppler and Color Doppler (Both Spectral and Color            Flow Doppler were utilized during procedure).  Indications:    Chest Pain R07.9   History:        Patient has no prior history of Echocardiogram examinations.   Sonographer:    Danielle Reilly Referring Phys: [id number] Danielle Reilly  IMPRESSIONS   1. Left ventricular ejection fraction, by estimation, is 60 to 65%. The left ventricle has normal function. The left ventricle has no regional wall motion abnormalities. Left ventricular diastolic parameters were normal.  2. Right ventricular systolic function is normal. The right ventricular size is normal.  3. The mitral valve is normal in structure. No evidence of mitral valve regurgitation. No evidence of mitral stenosis.  4. The aortic valve is normal in structure. Aortic valve regurgitation is not visualized. No aortic stenosis is present.  5. The inferior vena cava is  normal in size with greater than 50% respiratory variability, suggesting right atrial pressure of 3 mmHg.  FINDINGS  Left Ventricle: Left ventricular ejection fraction, by estimation, is 60 to 65%. The left ventricle has normal function. The left ventricle has no  regional wall motion abnormalities. The left ventricular internal cavity size was normal in size. There is  no left ventricular hypertrophy. Left ventricular diastolic parameters were normal.  Right Ventricle: The right ventricular size is normal. No increase in right ventricular wall thickness. Right ventricular systolic function is normal.  Left Danielle: Left atrial size was normal in size.  Right Danielle: Right atrial size was normal in size.  Pericardium: There is no evidence of pericardial effusion.  Mitral Valve: The mitral valve is normal in structure. No evidence of mitral valve regurgitation. No evidence of mitral valve stenosis.  Tricuspid Valve: The tricuspid valve is normal in structure. Tricuspid valve regurgitation is not demonstrated. No evidence of tricuspid stenosis.  Aortic Valve: The aortic valve is normal in structure. Aortic valve regurgitation is not visualized. No aortic stenosis is present. Aortic valve mean gradient measures 4.0 mmHg. Aortic valve peak gradient measures 6.2 mmHg. Aortic valve area, by VTI  measures 2.56 cm.  Pulmonic Valve: The pulmonic valve was normal in structure. Pulmonic valve regurgitation is not visualized. No evidence of pulmonic stenosis.  Aorta: The aortic root is normal in size and structure.  Venous: The inferior vena cava is normal in size with greater than 50% respiratory variability, suggesting right atrial pressure of 3 mmHg.  IAS/Shunts: No atrial level shunt detected by color flow Doppler.    LEFT VENTRICLE PLAX 2D LVIDd:         4.50 cm   Diastology LVIDs:         3.20 cm   LV e' medial:    8.92 cm/s LV PW:         1.00 cm   LV E/e' medial:  7.6 LV IVS:         0.80 cm   LV e' lateral:   12.40 cm/s LVOT diam:     1.90 cm   LV E/e' lateral: 5.5 LV SV:         56 LV SV Index:   29 LVOT Area:     2.84 cm    RIGHT VENTRICLE RV S prime:     18.10 cm/s TAPSE (M-mode): 3.5 cm  LEFT Danielle             Index        RIGHT Danielle           Index LA Vol (A2C):   34.8 ml 18.24 ml/m  RA Area:     12.00 cm LA Vol (A4C):   24.5 ml 12.84 ml/m  RA Volume:   23.40 ml  12.27 ml/m LA Biplane Vol: 30.1 ml 15.78 ml/m  AORTIC VALVE AV Area (Vmax):    2.68 cm AV Area (Vmean):   2.44 cm AV Area (VTI):     2.56 cm AV Vmax:           124.00 cm/s AV Vmean:          95.700 cm/s AV VTI:            0.217 m AV Peak Grad:      6.2 mmHg AV Mean Grad:      4.0 mmHg LVOT Vmax:         117.00 cm/s LVOT Vmean:        82.500 cm/s LVOT VTI:          0.196 m LVOT/AV VTI ratio: 0.90   AORTA Ao Root diam: 2.90 cm  MITRAL VALVE MV Area (PHT): 6.71 cm    SHUNTS MV  Decel Time: 113 msec    Systemic VTI:  0.20 m MV E velocity: 67.60 cm/s  Systemic Diam: 1.90 cm MV A velocity: 88.00 cm/s MV E/A ratio:  0.77  Aditya Sabharwal Electronically signed by Ria Commander Signature Date/Time: 12/31/2023/5:34:09 PM      Final   US  Abdomen Limited RUQ (LIVER/GB) CLINICAL DATA:  Right upper quadrant pain  EXAM: ULTRASOUND ABDOMEN LIMITED RIGHT UPPER QUADRANT  COMPARISON:  CT from the previous day.  FINDINGS: Gallbladder:  No gallstones or wall thickening visualized. No sonographic Murphy sign noted by sonographer.  Common bile duct:  Diameter: 2.3 mm.  Liver:  Diffusely increased in echogenicity consistent with fatty infiltration. Portal vein is patent on color Doppler imaging with normal direction of blood flow towards the liver.  Other: None.  IMPRESSION: Fatty liver.  No gallbladder abnormality is noted.  Electronically Signed   By: Oneil Devonshire M.D.   On: 12/31/2023 02:06     Past Medical History:  Diagnosis Date   Acute pancreatitis     Allergy    SEASONAL   Anemia    Anxiety    A LITTLE   Depression    Gastritis    GERD (gastroesophageal reflux disease)    tums as needed   Hemoperitoneum 12/25/2010   Nausea and vomiting 06/17/2019   Substance abuse (HCC)    ETOH ABUSE   SVD (spontaneous vaginal delivery)    x 1   Toothache 01/29/2013    Past Surgical History:  Procedure Laterality Date   BILATERAL SALPINGECTOMY N/A 06/19/2013   Procedure: BILATERAL SALPINGECTOMY;  Surgeon: Elveria Mungo, MD;  Location: [facility];  Service: Gynecology;  Laterality: N/A;   DILITATION & CURRETTAGE/HYSTROSCOPY WITH NOVASURE ABLATION N/A 04/04/2013   Procedure: DILATATION & CURETTAGE/HYSTEROSCOPY WITH Attempted NOVASURE ABLATION ;  Surgeon: Elveria Mungo, MD;  Location: [facility];  Service: Gynecology;  Laterality: N/A;   HYSTEROSCOPY N/A 05/26/2013   Procedure: UNSUCCESSFUL HYSTEROSCOPY WITH HYDROTHERMAL ABLATION;  Surgeon: Elveria Mungo, MD;  Location: [facility];  Service: Gynecology;  Laterality: N/A;   LAPAROSCOPY  12/25/2010   Procedure: LAPAROSCOPY OPERATIVE;  Surgeon: Olam DELENA Mill, MD;  Location: [facility];  Service: Gynecology;  Laterality: N/A;  Operative laparoscopy /cauterization of right hemorrhagic cyst wall. Removal of belly rings.   LAPAROSCOPY FOR ECTOPIC PREGNANCY     NOVASURE ABLATION N/A 05/26/2013   Procedure: UNSUCCESSFUL NOVASURE ABLATION;  Surgeon: Elveria Mungo, MD;  Location: [facility];  Service: Gynecology;  Laterality: N/A;   uterine biopsy     VAGINAL HYSTERECTOMY N/A 06/19/2013   Procedure:  TOTAL VAGINAL HYSTERECTOMY with bilateral salpingectomy;  Surgeon: Elveria Mungo, MD;  Location: [facility];  Service: Gynecology;  Laterality: N/A;    Family History  Problem Relation Age of Onset   Drug abuse Mother    Arthritis Maternal Aunt    Cancer Maternal Aunt    Drug abuse Maternal Aunt    Arthritis Maternal Uncle    Alcohol abuse Maternal Uncle    Cancer Maternal Uncle     Drug abuse Maternal Uncle    Cancer Maternal Grandmother    Alcohol abuse Maternal Grandfather    Cancer Maternal Grandfather    Colon cancer Neg Hx    Colon polyps Neg Hx    Crohn's disease Neg Hx    Esophageal cancer Neg Hx    Rectal cancer Neg Hx    Stomach cancer Neg Hx    Ulcerative colitis Neg Hx     Prior to Admission medications  Medication Sig Start Date End Date Taking? Authorizing Provider  Azelastine  HCl 137 MCG/SPRAY SOLN PLACE 2 SPRAYS INTO BOTH NOSTRILS 2 (TWO) TIMES DAILY. USE IN EACH NOSTRIL AS DIRECTED 12/03/23  Yes [name], Emil Schanz, MD  FLUoxetine  (PROZAC ) 20 MG capsule Take 1 capsule (20 mg total) by mouth daily. 11/29/23  Yes Arfeen, Leni DASEN, MD  fluticasone  (FLONASE ) 50 MCG/ACT nasal spray Place 1 spray into both nostrils daily as needed for allergies or rhinitis.   Yes [provider]  Multiple Vitamin (MULTIVITAMINS PO) Take 1 tablet by mouth daily.   Yes [provider]  naltrexone  (DEPADE) 50 MG tablet Take 1 tablet (50 mg total) by mouth daily. 11/29/23  Yes Arfeen, Leni DASEN, MD  traZODone  (DESYREL ) 50 MG tablet Take 1-2 tablets (50-100 mg total) by mouth at bedtime. 11/29/23  Yes Arfeen, Leni DASEN, MD  LORazepam  (ATIVAN ) 1 MG tablet Take 1 tablet (1 mg total) by mouth 3 (three) times daily as needed for anxiety. 10/10/23   Zackowski, Jaydalee Bardwell, MD  famotidine  (PEPCID ) 20 MG tablet Take 1 tablet (20 mg total) by mouth daily. Patient not taking: Reported on 10/28/2019 05/19/19 04/06/20  Dean Clarity, MD    Current Facility-Administered Medications  Medication Dose Route Frequency Provider Last Rate Last Admin   alum & mag hydroxide-simeth (MAALOX/MYLANTA) 200-200-20 MG/5ML suspension 30 mL  30 mL Oral Q6H PRN Ezenduka, Nkeiruka J, MD       FLUoxetine  (PROZAC ) capsule 20 mg  20 mg Oral Daily Ezenduka, Nkeiruka J, MD   20 mg at 12/31/23 1426   fluticasone  (FLONASE ) 50 MCG/ACT nasal spray 2 spray  2 spray Each Nare Daily Singh, Prashant K, MD        folic acid  (FOLVITE ) tablet 1 mg  1 mg Oral Daily Reilly, Anastassia, MD   1 mg at 12/31/23 9063   guaiFENesin  (MUCINEX ) 12 hr tablet 600 mg  600 mg Oral BID Sundil, Subrina, MD   600 mg at 12/31/23 2348   loratadine  (CLARITIN ) tablet 10 mg  10 mg Oral Daily Singh, Prashant K, MD       LORazepam  (ATIVAN ) tablet 1-4 mg  1-4 mg Oral Q1H PRN Reilly, Anastassia, MD   2 mg at 12/31/23 2204   Or   LORazepam  (ATIVAN ) injection 1-4 mg  1-4 mg Intravenous Q1H PRN Reilly, Anastassia, MD       magnesium  sulfate IVPB 1 g 100 mL  1 g Intravenous Once Singh, Prashant K, MD       menthol  (CEPACOL) lozenge 3 mg  1 lozenge Oral PRN Sundil, Subrina, MD   3 mg at 12/31/23 2348   multivitamin with minerals tablet 1 tablet  1 tablet Oral Daily Reilly, Anastassia, MD   1 tablet at 12/31/23 9063   ondansetron  (ZOFRAN ) tablet 4 mg  4 mg Oral Q6H PRN Reilly, Anastassia, MD   4 mg at 12/31/23 1020   Or   ondansetron  (ZOFRAN ) injection 4 mg  4 mg Intravenous Q6H PRN Reilly, Anastassia, MD       pantoprazole  (PROTONIX ) injection 40 mg  40 mg Intravenous Q12H Reilly, Anastassia, MD   40 mg at 12/31/23 2204   potassium chloride  SA (KLOR-CON  M) CR tablet 20 mEq  20 mEq Oral Once Singh, Prashant K, MD       potassium chloride  SA (KLOR-CON  M) CR tablet 40 mEq  40 mEq Oral Once Singh, Prashant K, MD       sodium phosphate  30 mmol in sodium chloride  0.9 %  250 mL infusion  30 mmol Intravenous Once Dennise Lavada POUR, MD 43 mL/hr at 01/01/24 0634 30 mmol at 01/01/24 9365   thiamine  (VITAMIN B1) tablet 100 mg  100 mg Oral Daily Reilly, Anastassia, MD   100 mg at 12/31/23 9063   Or   thiamine  (VITAMIN B1) injection 100 mg  100 mg Intravenous Daily Reilly, Anastassia, MD        Allergies as of 12/30/2023 - Review Complete 12/30/2023  Allergen Reaction Noted   Fluconazole  Hives 03/18/2021   Hydrocodone  Nausea And Vomiting 10/22/2020    Social History   Socioeconomic History   Marital status: Divorced    Spouse name:  Not on file   Number of children: Not on file   Years of education: Not on file   Highest education level: Some college, no degree  Occupational History   Not on file  Tobacco Use   Smoking status: Never   Smokeless tobacco: Never  Vaping Use   Vaping status: Never Used  Substance and Sexual Activity   Alcohol use: Not Currently    Comment: STOPPED 6 MONTHS AGO   Drug use: Not Currently   Sexual activity: Yes    Partners: Male    Birth control/protection: Surgical, Condom  Other Topics Concern   Not on file  Social History Narrative   Pt lives in [place] with her son.  She is unemployed.  She is not followed by a psychiatrist.   Social Drivers of Health   Financial Resource Strain: Medium Risk (08/06/2023)   Overall Financial Resource Strain (CARDIA)    Difficulty of Paying Living Expenses: Somewhat hard  Food Insecurity: No Food Insecurity (12/31/2023)   Hunger Vital Sign    Worried About Running Out of Food in the Last Year: Never true    Ran Out of Food in the Last Year: Never true  Transportation Needs: No Transportation Needs (12/31/2023)   PRAPARE - Administrator, Civil Service (Medical): No    Lack of Transportation (Non-Medical): No  Physical Activity: Insufficiently Active (08/06/2023)   Exercise Vital Sign    Days of Exercise per Week: 2 days    Minutes of Exercise per Session: 50 min  Stress: Stress Concern Present (08/06/2023)   [employer] of Occupational Health - Occupational Stress Questionnaire    Feeling of Stress : To some extent  Social Connections: Unknown (12/31/2023)   Social Connection and Isolation Panel    Frequency of Communication with Friends and Family: Never    Frequency of Social Gatherings with Friends and Family: Once a week    Attends Religious Services: More than 4 times per year    Active Member of [employer] or Organizations: No    Attends Banker Meetings: Never    Marital Status: Not on file  Intimate  Partner Violence: Not At Risk (12/31/2023)   Humiliation, Afraid, Rape, and Kick questionnaire    Fear of Current or Ex-Partner: No    Emotionally Abused: No    Physically Abused: No    Sexually Abused: No     Code Status   Code Status: Full Code  Review of Systems: All systems reviewed and negative except where noted in HPI.  Physical Exam: Vital signs in last 24 hours: Temp:  [97.6 F (36.4 C)-98.6 F (37 C)] 97.6 F (36.4 C) (09/16 0040) Pulse Rate:  [98-122] 101 (09/16 0040) Resp:  [12-22] 17 (09/16 0040) BP: (90-133)/(61-93) 109/80 (09/16 0040) SpO2:  [94 %-100 %] 94 % (  09/16 0040) Last BM Date : 12/31/23  General:  Pleasant female in NAD Psych:  Cooperative. Normal mood and affect Eyes: Pupils equal Ears:  Normal auditory acuity Nose: No deformity, discharge or lesions Neck:  Supple, no masses felt Lungs:  Clear to auscultation.  Heart:  Regular rate, regular rhythm.  Abdomen:  Soft, nondistended, nontender, active bowel sounds, no masses felt Rectal :  Deferred Msk: Symmetrical without gross deformities.  No chest wall tenderness to palpation.   Neurologic:  Alert, oriented, grossly normal neurologically Extremities : No edema Skin:  Intact without significant lesions.    Intake/Output from previous day: 09/15 0701 - 09/16 0700 In: 240 [P.O.:240] Out: -  Intake/Output this shift:  No intake/output data recorded.   Vina Dasen, NP-C   01/01/2024, 9:34 AM  --------------------------------------------------------------------  I have taken a history, reviewed the chart and examined the patient. I performed a substantive portion of this encounter, including complete performance of at least one of the key components, in conjunction with the APP. I agree with the APP's note, impression and recommendations   48 year old female with history of chronic alcohol abuse, admitted with symptoms of chest pain/odynophagia, weight loss, p.o. intolerance.  Patient  states that she is unable to eat solid food and has significant pain when swallowing. Low suspicion for high-grade esophageal stricture or significant esophageal dysmotility.  Suspect symptoms are functional, possibly related to alcohol withdrawal, but agreed to proceed with upper endoscopy to exclude significant esophagitis/stricture while admitted.  We also discussed her alcohol use disorder and liver disease.  We discussed that her labs are concerning for possible underlying cirrhosis given the elevated bilirubin and thrombocytopenia.  Would recommend outpatient elastography for further evaluation.  She understands that further alcohol consumption increases her risk for cirrhosis (if she does not already have it) and that this is not reversible.  Kenzel Ruesch E. Stacia, MD [facility]  Moderate complex medical decision making (this includes chart review, review of results, face-to-face time used for counseling as well as treatment plan and follow-up. The patient was provided an opportunity to ask questions and all were answered. The patient agreed with the plan and demonstrated an understanding of the instructions

## 2024-01-01 NOTE — Evaluation (Signed)
 Physical Therapy Brief Evaluation and Discharge Note Patient Details Name: Danielle Reilly MRN: [id number] DOB: [birth year] Today's Date: 01/01/2024   History of Present Illness  48 y.o. female presented with chest pain/indigestion, SOB, significant N/V, also reports she may be in alcohol withdrawal. Past medical history significant of alcohol abuse, GERD, anxiety.  Clinical Impression  Pt presents with admitting diagnosis above. Pt today was able to ambulate in hallway independently. PTA pt was fully independent. Pt presents at or near baseline mobility. Pt has no further acute PT needs and will be signing off. Re consult PT if mobility status changes. Pt would benefit from continued mobility with mobility specialist during acute stay.        PT Assessment Patient does not need any further PT services  Assistance Needed at Discharge  PRN    Equipment Recommendations None recommended by PT  Recommendations for Other Services       Precautions/Restrictions Precautions Precautions: Fall Recall of Precautions/Restrictions: Intact Restrictions Weight Bearing Restrictions Per Provider Order: No        Mobility  Bed Mobility   Supine/Sidelying to sit: Modified independent (Device/Increased time) Sit to supine/sidelying: Modified independent (Device/Increased time) General bed mobility comments: HOB elevated  Transfers Overall transfer level: Independent Equipment used: None                    Ambulation/Gait Ambulation/Gait assistance: Independent Gait Distance (Feet): 600 Feet Assistive device: None Gait Pattern/deviations: Decreased stride length, Step-through pattern Gait Speed: Below normal General Gait Details: Slowed step through pattern however pt gained speed the further she ambulated.  Home Activity Instructions    Stairs            Modified Rankin (Stroke Patients Only)        Balance Overall balance assessment: Independent                         Pertinent Vitals/Pain PT - Brief Vital Signs All Vital Signs Stable: Yes Pain Assessment Pain Assessment: 0-10 Pain Score: 10-Worst pain ever Pain Location: Chest and stomach Pain Descriptors / Indicators: Sharp, Shooting Pain Intervention(s): Monitored during session, Limited activity within patient's tolerance     Home Living Family/patient expects to be discharged to:: Private residence Living Arrangements: Children Available Help at Discharge: Family;Available PRN/intermittently Home Environment: Level entry   Home Equipment: None   Additional Comments: Lives in 1st floor apartment with son.    Prior Function Level of Independence: Independent      UE/LE Assessment   UE ROM/Strength/Tone/Coordination: WFL    LE ROM/Strength/Tone/Coordination: [facility]      Communication   Communication Communication: No apparent difficulties     Cognition Overall Cognitive Status: Appears within functional limits for tasks assessed/performed       General Comments General comments (skin integrity, edema, etc.): HR in the 100s during ambulation    Exercises     Assessment/Plan    PT Problem List         PT Visit Diagnosis Other abnormalities of gait and mobility (R26.89)    No Skilled PT     Co-evaluation                AMPAC 6 Clicks Help needed turning from your back to your side while in a flat bed without using bedrails?: None Help needed moving from lying on your back to sitting on the side of a flat bed without using bedrails?: None Help needed moving  to and from a bed to a chair (including a wheelchair)?: None Help needed standing up from a chair using your arms (e.g., wheelchair or bedside chair)?: None Help needed to walk in hospital room?: None Help needed climbing 3-5 steps with a railing? : None 6 Click Score: 24      End of Session Equipment Utilized During Treatment: Gait belt Activity Tolerance: Patient tolerated  treatment well Patient left: in bed;with call bell/phone within reach Nurse Communication: Mobility status PT Visit Diagnosis: Other abnormalities of gait and mobility (R26.89)     Time: 9086-9071 PT Time Calculation (min) (ACUTE ONLY): 15 min  Charges:   PT Evaluation $PT Eval Low Complexity: 1 Low      Sueellen NOVAK, PT, DPT Acute Rehab Services [phone]   Cheyane Ayon  01/01/2024, 9:40 AM

## 2024-01-01 NOTE — Plan of Care (Signed)

## 2024-01-02 ENCOUNTER — Encounter (HOSPITAL_COMMUNITY): Payer: Self-pay | Admitting: Internal Medicine

## 2024-01-02 ENCOUNTER — Inpatient Hospital Stay (HOSPITAL_COMMUNITY): Payer: MEDICAID | Admitting: Anesthesiology

## 2024-01-02 ENCOUNTER — Ambulatory Visit (HOSPITAL_COMMUNITY): Payer: MEDICAID | Admitting: Licensed Clinical Social Worker

## 2024-01-02 ENCOUNTER — Encounter (HOSPITAL_COMMUNITY)
Admission: EM | Disposition: A | Discharge status: Home / Self Care | Payer: Self-pay | Source: Home / Self Care | Attending: Internal Medicine

## 2024-01-02 DIAGNOSIS — K449 Diaphragmatic hernia without obstruction or gangrene: Secondary | ICD-10-CM

## 2024-01-02 DIAGNOSIS — K3189 Other diseases of stomach and duodenum: Secondary | ICD-10-CM

## 2024-01-02 DIAGNOSIS — K2100 Gastro-esophageal reflux disease with esophagitis, without bleeding: Secondary | ICD-10-CM

## 2024-01-02 DIAGNOSIS — F418 Other specified anxiety disorders: Secondary | ICD-10-CM

## 2024-01-02 DIAGNOSIS — N179 Acute kidney failure, unspecified: Secondary | ICD-10-CM | POA: Diagnosis not present

## 2024-01-02 HISTORY — PX: BIOPSY OF SKIN SUBCUTANEOUS TISSUE AND/OR MUCOUS MEMBRANE: SHX6741

## 2024-01-02 HISTORY — PX: ESOPHAGOGASTRODUODENOSCOPY: SHX5428

## 2024-01-02 LAB — CBC WITH DIFFERENTIAL/PLATELET
Abs Immature Granulocytes: 0.02 K/uL (ref 0.00–0.07)
Basophils Absolute: 0 K/uL (ref 0.0–0.1)
Basophils Relative: 1 %
Eosinophils Absolute: 0 K/uL (ref 0.0–0.5)
Eosinophils Relative: 0 %
HCT: 29.9 % — ABNORMAL LOW (ref 36.0–46.0)
Hemoglobin: 10 g/dL — ABNORMAL LOW (ref 12.0–15.0)
Immature Granulocytes: 1 %
Lymphocytes Relative: 26 %
Lymphs Abs: 1.1 K/uL (ref 0.7–4.0)
MCH: 28.8 pg (ref 26.0–34.0)
MCHC: 33.4 g/dL (ref 30.0–36.0)
MCV: 86.2 fL (ref 80.0–100.0)
Monocytes Absolute: 0.2 K/uL (ref 0.1–1.0)
Monocytes Relative: 6 %
Neutro Abs: 2.9 K/uL (ref 1.7–7.7)
Neutrophils Relative %: 66 %
Platelets: 93 K/uL — ABNORMAL LOW (ref 150–400)
RBC: 3.47 MIL/uL — ABNORMAL LOW (ref 3.87–5.11)
RDW: 14.3 % (ref 11.5–15.5)
WBC: 4.3 K/uL (ref 4.0–10.5)
nRBC: 0 % (ref 0.0–0.2)

## 2024-01-02 LAB — COMPREHENSIVE METABOLIC PANEL WITH GFR
ALT: 28 U/L (ref 0–44)
AST: 51 U/L — ABNORMAL HIGH (ref 15–41)
Albumin: 3 g/dL — ABNORMAL LOW (ref 3.5–5.0)
Alkaline Phosphatase: 45 U/L (ref 38–126)
Anion gap: 7 (ref 5–15)
BUN: <5 mg/dL — ABNORMAL LOW (ref 6–20)
CO2: 25 mmol/L (ref 22–32)
Calcium: 8.3 mg/dL — ABNORMAL LOW (ref 8.9–10.3)
Chloride: 105 mmol/L (ref 98–111)
Creatinine, Ser: 0.79 mg/dL (ref 0.44–1.00)
GFR, Estimated: >60 mL/min (ref >60)
Glucose, Bld: 104 mg/dL — ABNORMAL HIGH (ref 70–99)
Potassium: 2.8 mmol/L — ABNORMAL LOW (ref 3.5–5.1)
Sodium: 137 mmol/L (ref 135–145)
Total Bilirubin: 0.6 mg/dL (ref 0.0–1.2)
Total Protein: 5.7 g/dL — ABNORMAL LOW (ref 6.5–8.1)

## 2024-01-02 LAB — POCT I-STAT, CHEM 8
BUN: <3 mg/dL — ABNORMAL LOW (ref 6–20)
Calcium, Ion: 1.15 mmol/L (ref 1.15–1.40)
Chloride: 104 mmol/L (ref 98–111)
Creatinine, Ser: 0.8 mg/dL (ref 0.44–1.00)
Glucose, Bld: 107 mg/dL — ABNORMAL HIGH (ref 70–99)
HCT: 36 % (ref 36.0–46.0)
Hemoglobin: 12.2 g/dL (ref 12.0–15.0)
Potassium: 3.7 mmol/L (ref 3.5–5.1)
Sodium: 139 mmol/L (ref 135–145)
TCO2: 24 mmol/L (ref 22–32)

## 2024-01-02 LAB — MAGNESIUM: Magnesium: 1.9 mg/dL (ref 1.7–2.4)

## 2024-01-02 LAB — PHOSPHORUS: Phosphorus: 2.8 mg/dL (ref 2.5–4.6)

## 2024-01-02 SURGERY — EGD (ESOPHAGOGASTRODUODENOSCOPY)
Anesthesia: Monitor Anesthesia Care

## 2024-01-02 MED ORDER — PROPOFOL 500 MG/50ML IV EMUL
INTRAVENOUS | Status: DC | PRN
  Administered 2024-01-02: 40 mg via INTRAVENOUS
  Administered 2024-01-02 (×3): 20 mg via INTRAVENOUS
  Administered 2024-01-02: 30 mg via INTRAVENOUS
  Administered 2024-01-02: 125 ug/kg/min via INTRAVENOUS

## 2024-01-02 MED ORDER — POTASSIUM CHLORIDE 20 MEQ PO PACK
40.0000 meq | PACK | Freq: Once | ORAL | Status: AC
  Administered 2024-01-02: 40 meq via ORAL
  Filled 2024-01-02: qty 2

## 2024-01-02 MED ORDER — POTASSIUM CHLORIDE 10 MEQ/100ML IV SOLN
10.0000 meq | INTRAVENOUS | Status: AC
Start: 2024-01-02 — End: 2024-01-02
  Administered 2024-01-02 (×2): 10 meq via INTRAVENOUS
  Filled 2024-01-02 (×2): qty 100

## 2024-01-02 MED ORDER — GLYCOPYRROLATE 0.2 MG/ML IJ SOLN
INTRAMUSCULAR | Status: DC | PRN
  Administered 2024-01-02: .2 mg via INTRAVENOUS

## 2024-01-02 MED ORDER — LIDOCAINE 2% (20 MG/ML) 5 ML SYRINGE
INTRAMUSCULAR | Status: DC | PRN
  Administered 2024-01-02: 80 mg via INTRAVENOUS
  Administered 2024-01-02: 20 mg via INTRAVENOUS

## 2024-01-02 MED ORDER — POTASSIUM CHLORIDE 10 MEQ/100ML IV SOLN
10.0000 meq | INTRAVENOUS | Status: AC
  Administered 2024-01-02 (×4): 10 meq via INTRAVENOUS
  Filled 2024-01-02 (×4): qty 100

## 2024-01-02 MED ORDER — SODIUM CHLORIDE 0.9 % IV SOLN
INTRAVENOUS | Status: AC | PRN
Start: 2024-01-02 — End: 2024-01-02
  Administered 2024-01-02: 500 mL via INTRAVENOUS

## 2024-01-02 NOTE — Progress Notes (Addendum)
   01/02/24 1123  Mobility  Activity Ambulated independently  Level of Assistance Independent  Assistive Device None  Distance Ambulated (ft) 250 ft  Activity Response Tolerated fair  Mobility Referral Yes  Mobility visit 1 Mobility  Mobility Specialist Start Time (ACUTE ONLY) 1123  Mobility Specialist Stop Time (ACUTE ONLY) 1131  Mobility Specialist Time Calculation (min) (ACUTE ONLY) 8 min   Mobility Specialist: Progress Note  Pt agreeable to mobility session - received in bed. Pt was asymptomatic throughout session c/o being hungry d/t NPO status. Returned to EOB with all needs met - call bell within reach.   _____________________________________________________________________________  During Mobility: HR 118 Post Mobility: HR 107  Pt agreeable to mobility session - received in bed. Pt was asymptomatic throughout session. Returned to bed with all needs met - call bell within reach.   Virgle Boards, BS Mobility Specialist Please contact via SecureChat or  Rehab office at [phone].

## 2024-01-02 NOTE — Anesthesia Preprocedure Evaluation (Signed)
 Anesthesia Evaluation  Patient identified by MRN, date of birth, ID band Patient awake    Reviewed: Allergy & Precautions, NPO status , Patient's Chart, lab work & pertinent test results  History of Anesthesia Complications Negative for: history of anesthetic complications  Airway Mallampati: II  TM Distance: >3 FB Neck ROM: Full    Dental no notable dental hx. (+) Teeth Intact   Pulmonary neg pulmonary ROS, neg sleep apnea, neg COPD, Patient abstained from smoking.Not current smoker   Pulmonary exam normal breath sounds clear to auscultation       Cardiovascular Exercise Tolerance: Good METS(-) hypertension(-) CAD and (-) Past MI negative cardio ROS (-) dysrhythmias  Rhythm:Regular Rate:Normal - Systolic murmurs    Neuro/Psych  PSYCHIATRIC DISORDERS Anxiety Depression    negative neurological ROS     GI/Hepatic ,GERD  ,,(+)     substance abuse  alcohol useNo vomiting since 4 days prior   Endo/Other  neg diabetes    Renal/GU negative Renal ROS     Musculoskeletal   Abdominal   Peds  Hematology  (+) Blood dyscrasia, anemia   Anesthesia Other Findings Past Medical History: No date: Acute pancreatitis No date: Allergy     Comment:  SEASONAL No date: Anemia No date: Anxiety     Comment:  A LITTLE No date: Depression No date: Gastritis No date: GERD (gastroesophageal reflux disease)     Comment:  tums as needed 12/25/2010: Hemoperitoneum 06/17/2019: Nausea and vomiting No date: Substance abuse (HCC)     Comment:  ETOH ABUSE No date: SVD (spontaneous vaginal delivery)     Comment:  x 1 01/29/2013: Toothache  Reproductive/Obstetrics                              Anesthesia Physical Anesthesia Plan  ASA: 2  Anesthesia Plan: MAC   Post-op Pain Management: Minimal or no pain anticipated   Induction: Intravenous  PONV Risk Score and Plan: 2 and Propofol  infusion, TIVA and  Ondansetron   Airway Management Planned: Nasal Cannula  Additional Equipment: None  Intra-op Plan:   Post-operative Plan:   Informed Consent: I have reviewed the patients History and Physical, chart, labs and discussed the procedure including the risks, benefits and alternatives for the proposed anesthesia with the patient or authorized representative who has indicated his/her understanding and acceptance.     Dental advisory given  Plan Discussed with: CRNA and Surgeon  Anesthesia Plan Comments: (Discussed risks of anesthesia with patient, including possibility of difficulty with spontaneous ventilation under anesthesia necessitating airway intervention, PONV, and rare risks such as cardiac or respiratory or neurological events, and allergic reactions. Discussed the role of CRNA in patient's perioperative care. Patient understands.)        Anesthesia Quick Evaluation

## 2024-01-02 NOTE — TOC Initial Note (Signed)
 Transition of Care [facility]) - Initial/Assessment Note    Patient Details  Name: Danielle Reilly MRN: [id number] Date of Birth: August 13, 1975  Transition of Care [facility]) CM/SW Contact:    Inocente GORMAN Kindle, LCSW Phone Number: 01/02/2024, 11:45 AM  Clinical Narrative:                 CSW met with patient who confirmed she lives at home with her son and has a PCP and transportation. CSW discussed ETOH consult and she reported that she is already seeing a Veterinary surgeon. She declined needing any additional resources. No further needs at this time.   Expected Discharge Plan: Home/Self Care Barriers to Discharge: Continued Medical Work up   Patient Goals and CMS Choice            Expected Discharge Plan and Services In-house Referral: Clinical Social Work                                            Prior Living Arrangements/Services   Lives with:: Adult Children Patient language and need for interpreter reviewed:: Yes Do you feel safe going back to the place where you live?: Yes      Need for Family Participation in Patient Care: No (Comment) Care giver support system in place?: Yes (comment)   Criminal Activity/Legal Involvement Pertinent to Current Situation/Hospitalization: No - Comment as needed  Activities of Daily Living   ADL Screening (condition at time of admission) Independently performs ADLs?: Yes (appropriate for developmental age) Is the patient deaf or have difficulty hearing?: No Does the patient have difficulty seeing, even when wearing glasses/contacts?: No Does the patient have difficulty concentrating, remembering, or making decisions?: No  Permission Sought/Granted                  Emotional Assessment Appearance:: Appears stated age Attitude/Demeanor/Rapport: Engaged, Gracious Affect (typically observed): Accepting, Pleasant Orientation: : Oriented to Self, Oriented to Place, Oriented to  Time, Oriented to Situation Alcohol / Substance Use:  Alcohol Use Psych Involvement: No (comment)  Admission diagnosis:  Sore throat [J02.9] Generalized abdominal pain [R10.84] High anion gap metabolic acidosis [E87.29] AKI (acute kidney injury) (HCC) [N17.9] Nausea and vomiting, unspecified vomiting type [R11.2] Patient Active Problem List   Diagnosis Date Noted   RUQ abdominal tenderness 12/31/2023   AKI (acute kidney injury) (HCC) 12/30/2023   Dehydration 12/30/2023   Chest pain 12/30/2023   Chronic rhinitis 08/07/2023   Alcohol intoxication in relapsed alcoholic (HCC) 01/24/2023   Alcohol abuse with other alcohol-induced disorder (HCC) 01/23/2023   Chronic post-traumatic stress disorder (PTSD) 01/23/2023   Major depressive disorder, single episode, severe without psychotic features (HCC) 01/23/2023   Generalized anxiety disorder 12/06/2020   History of gastritis 12/06/2020   History of alcohol abuse 12/06/2020   Hypokalemia 06/17/2019   Tachycardia 06/17/2019   GERD (gastroesophageal reflux disease) 06/17/2019   PCP:  Purcell Emil Schanz, MD Pharmacy:   [facility] [phone] [place] [place], [place] - [address] [place] [address] Phone: [phone] Fax: [phone]     Social Drivers of Health (SDOH) Social History: SDOH Screenings   Food Insecurity: No Food Insecurity (12/31/2023)  Housing: Low Risk  (12/31/2023)  Transportation Needs: No Transportation Needs (12/31/2023)  Utilities: Not At Risk (12/31/2023)  Alcohol Screen: High Risk (08/06/2023)  Depression (PHQ2-9): Low Risk  (08/07/2023)  Financial Resource Strain:  Medium Risk (08/06/2023)  Physical Activity: Insufficiently Active (08/06/2023)  Social Connections: Unknown (12/31/2023)  Stress: Stress Concern Present (08/06/2023)  Tobacco Use: Low Risk  (12/31/2023)   SDOH Interventions:     Readmission Risk Interventions    12/21/2021    2:59 PM  Readmission Risk Prevention Plan  Post Dischage Appt Complete  Medication Screening  Complete  Transportation Screening Complete

## 2024-01-02 NOTE — Plan of Care (Signed)

## 2024-01-02 NOTE — Op Note (Signed)
 [facility] Patient Name: Danielle Reilly Procedure Date : 01/02/2024 MRN: [id number] Attending MD: Glendia BRAVO. Stacia , MD, [id number] Date of Birth: 1975-09-13 CSN: [id number] Age: 48 Admit Type: Inpatient Procedure:                Upper GI endoscopy Indications:              Odynophagia, Esophageal dysphagia, Nausea with                            vomiting Providers:                Glendia E. Stacia, MD, Gregoria Pierce, RN,                            Haskel Chris, Technician Referring MD:              Medicines:                Monitored Anesthesia Care Complications:            No immediate complications. Estimated Blood Loss:     Estimated blood loss was minimal. Procedure:                Pre-Anesthesia Assessment:                           - Prior to the procedure, a History and Physical                            was performed, and patient medications and                            allergies were reviewed. The patient's tolerance of                            previous anesthesia was also reviewed. The risks                            and benefits of the procedure and the sedation                            options and risks were discussed with the patient.                            All questions were answered, and informed consent                            was obtained. Prior Anticoagulants: The patient has                            taken no anticoagulant or antiplatelet agents. ASA                            Grade Assessment: II - A patient with mild systemic  disease. After reviewing the risks and benefits,                            the patient was deemed in satisfactory condition to                            undergo the procedure.                           After obtaining informed consent, the endoscope was                            passed under direct vision. Throughout the                            procedure, the patient's  blood pressure, pulse, and                            oxygen saturations were monitored continuously. The                            [id number]-[id number] ([id number]) Olympus endoscope was introduced                            through the mouth, and advanced to the second part                            of duodenum. The upper GI endoscopy was                            accomplished without difficulty. The patient                            tolerated the procedure fairly well. Scope In: Scope Out: Findings:      The examined portions of the nasopharynx, oropharynx and larynx were       normal.      LA Grade D (one or more mucosal breaks involving at least 75% of       esophageal circumference) esophagitis with no bleeding was found 30 to       35 cm from the incisors. This inflamed segment was narrowed and friable.       Contact bleeding occured with passage of the endoscope. Biopsies were       taken with a cold forceps for histology. Estimated blood loss was       minimal. The biopsy site oozed for a few seconds but stopped       spontaneously      The exam of the esophagus was otherwise normal.      A 5 cm hiatal hernia was present.      A few dispersed diminutive erosions with no bleeding and no stigmata of       recent bleeding were found in the prepyloric region of the stomach.       Biopsies were taken with a cold forceps for Helicobacter pylori testing.       Estimated blood loss was minimal.  The exam of the stomach was otherwise normal.      Diffuse mildly erythematous mucosa without active bleeding and with no       stigmata of bleeding was found in the duodenal bulb.      The exam of the duodenum was otherwise normal. Impression:               - The examined portions of the nasopharynx,                            oropharynx and larynx were normal.                           - LA Grade D reflux esophagitis with luminal                            narrowing and friability with contact  bleeding.                            Biopsied.                           - Erosive gastropathy with no bleeding and no                            stigmata of recent bleeding. Biopsied.                           - 5 cm hiatal hernia.                           - Erythematous duodenopathy. Moderate Sedation:      N/A Recommendation:           - Return patient to hospital ward for ongoing care.                           - Advance diet as tolerated.                           - Use Protonix  (pantoprazole ) 40 mg PO BID for 8                            weeks, then once daily indefinite.                           - Use sucralfate  suspension 1 gram PO QID for 2                            weeks.                           - Repeat upper endoscopy in 8 weeks to check                            healing.                           -  Ok to discharge patient home today if able to                            tolerate PO.                           - Will arrange outpatient follow up to set up                            repeat EGD                           - GI will sign off at this time. Procedure Code(s):        --- Professional ---                           [phone], Esophagogastroduodenoscopy, flexible,                            transoral; with biopsy, single or multiple Diagnosis Code(s):        --- Professional ---                           K21.00, Gastro-esophageal reflux disease with                            esophagitis, without bleeding                           K31.89, Other diseases of stomach and duodenum                           K44.9, Diaphragmatic hernia without obstruction or                            gangrene                           R13.14, Dysphagia, pharyngoesophageal phase                           R11.2, Nausea with vomiting, unspecified CPT copyright 2022 American Medical Association. All rights reserved. The codes documented in this report are preliminary and upon coder review  may  be revised to meet current compliance requirements. Cassity Christian E. Stacia, MD 01/02/2024 3:04:45 PM This report has been signed electronically. Number of Addenda: 0

## 2024-01-02 NOTE — Anesthesia Postprocedure Evaluation (Signed)
 Anesthesia Post Note  Patient: Danielle Reilly  Procedure(s) Performed: EGD (ESOPHAGOGASTRODUODENOSCOPY) BIOPSY, SKIN, SUBCUTANEOUS TISSUE, OR MUCOUS MEMBRANE     Patient location during evaluation: PACU Anesthesia Type: MAC Level of consciousness: awake and alert Pain management: pain level controlled Vital Signs Assessment: post-procedure vital signs reviewed and stable Respiratory status: spontaneous breathing and nonlabored ventilation Cardiovascular status: stable and blood pressure returned to baseline Postop Assessment: no apparent nausea or vomiting Anesthetic complications: no   No notable events documented.  Last Vitals:  Vitals:   01/02/24 1500 01/02/24 1510  BP: 107/77 109/78  Pulse: (!) 116 (!) 105  Resp: (!) 25 17  Temp:    SpO2: 97% 94%    Last Pain:  Vitals:   01/02/24 1510  TempSrc:   PainSc: 0-No pain                 Lauraine KATHEE [place]

## 2024-01-02 NOTE — Transfer of Care (Signed)
 Immediate Anesthesia Transfer of Care Note  Patient: Danielle Reilly  Procedure(s) Performed: EGD (ESOPHAGOGASTRODUODENOSCOPY) BIOPSY, SKIN, SUBCUTANEOUS TISSUE, OR MUCOUS MEMBRANE  Patient Location: PACU  Anesthesia Type:MAC  Level of Consciousness: awake and alert   Airway & Oxygen Therapy: Patient Spontanous Breathing  Post-op Assessment: Report given to RN and Post -op Vital signs reviewed and stable  Post vital signs: Reviewed and stable  Last Vitals:  Vitals Value Taken Time  BP 138/74 01/02/24 14:55  Temp 36.3 C 01/02/24 14:55  Pulse 121 01/02/24 14:56  Resp 11 01/02/24 14:56  SpO2 97 % 01/02/24 14:56  Vitals shown include unfiled device data.  Last Pain:  Vitals:   01/02/24 1455  TempSrc: Temporal  PainSc: 0-No pain         Complications: No notable events documented.

## 2024-01-02 NOTE — Interval H&P Note (Signed)
 History and Physical Interval Note:  01/02/2024 2:19 PM  Danielle Reilly  has presented today for surgery, with the diagnosis of nausea, vomiting, chest pain, odynophagia.  The various methods of treatment have been discussed with the patient and family. After consideration of risks, benefits and other options for treatment, the patient has consented to  Procedure(s): EGD (ESOPHAGOGASTRODUODENOSCOPY) (N/A) as a surgical intervention.  The patient's history has been reviewed, patient examined, no change in status, stable for surgery.  I have reviewed the patient's chart and labs.  Questions were answered to the patient's satisfaction.     Glendia FORBES Holt

## 2024-01-02 NOTE — Plan of Care (Signed)

## 2024-01-02 NOTE — Progress Notes (Addendum)
 PROGRESS NOTE                                                                                                                                                                                                             Patient Demographics:    Danielle Reilly, is a 48 y.o. female, DOB - 03/03/1976, FMW:[id number]  Outpatient Primary MD for the patient is Reilly, Danielle Jose, MD    LOS - 3  Admit date - 12/30/2023    Chief Complaint  Patient presents with   Chest Pain   Alcohol Intoxication       Brief Narrative (HPI from H&P)   48 y.o. female with medical history significant of alcohol abuse, GERD, anxiety. Presented with chest pain/indigestion, SOB, significant N/V, also reports she may be in alcohol withdrawal.  Last drink was on 12/29/2023. Patient reports that she has been drinking approximately fifth of hard liquor per day for the past 7 days. Denies having any withdrawal seizures.  In the ED, patient noted to be significantly tachycardic with generalized tremors, otherwise stable.    Labs showed K-->3.3, creatinine 1.49, bicarb 14, WBC 10.8, AST 78, ALT 35, T. bili 2.2, troponin 22--> 24. CTA chest with no evidence of PE, noted severe hepatic steatosis.  CT abdomen/pelvis noted for possible residual of a recently passed calculus, questionable cholelithiasis.  Right upper quadrant ultrasound showed no gallstones or wall thickening visualized, just fatty liver.  Patient admitted for further management.    Subjective:    [name] today ports she has been n.p.o. since midnight, but overall reports she is feeling better.   Assessment  & Plan :   Alcohol abuse/withdrawal Patient reports that she has been drinking approximately fifth of hard liquor per day for the past 7 days.  In mild DTs, counseled to quit, continue CIWA protocol and monitor.   Ongoing odynophagia.   With known history of gastritis, continue with PPI . - GI  input greatly appreciated, plan for endoscopy today    Left ear fullness.  Likely blocked eustachian tube, Claritin  and Flonase , monitor.    AKI with hypokalemia, hypophosphatemia Hydrate, AKI improving electrolytes replaced  Severe hypokalemia -2.8 today, replaced both IV and p.o.   Mild transaminitis AST elevated, T. bili elevated at 1.3, right upper quadrant ultrasound shows fatty liver, to abstain from alcohol.  GI consulted.  Question if she has underlying cirrhosis as she already has thrombocytopenia   Thrombocytopenia/normocytic anemia Likely 2/2 alcohol abuse Daily CBC   Mild rhabdomyolysis Hydrated   Mildly elevated troponin Mild from demand ischemia caused by dehydration and hypotension, trend is flat and in non-ACS pattern, chest pain-free stable echocardiogram.   Low free T4 Free T4-->0.56, TSH 1.496 Repeat as outpatient in 4 to 8 weeks likely sick euthyroid syndrome   GERD Continue PPI           Condition - Fair  Family Communication  :  None present  Code Status :  Full  Consults  :  GI  PUD Prophylaxis :  PPI   Procedures  :     CT chest abdomen pelvis.  No PE - 1. Mild fullness of the right collecting system with no frank hydronephrosis. This may reflect the residual of a recently passed calculus or reflect chronic changes of obstructive uropathy or reflux. 2. Question cholelithiasis. Recommend correlation with liver function tests ir consider right upper quadrant ultrasound for further evaluation. 3. Hepatic steatosis. 4. Status post hysterectomy.  Right upper quadrant ultrasound.  Fatty liver  Echocardiogram - 1. Left ventricular ejection fraction, by estimation, is 60 to 65%. The left ventricle has normal function. The left ventricle has no regional wall motion abnormalities. Left ventricular diastolic parameters were normal.  2. Right ventricular systolic function is normal. The right ventricular size is normal.  3. The mitral valve is normal in  structure. No evidence of mitral valve regurgitation. No evidence of mitral stenosis.  4. The aortic valve is normal in structure. Aortic valve regurgitation is not visualized. No aortic stenosis is present.  5. The inferior vena cava is normal in size with greater than 50% respiratory variability, suggesting right atrial pressure of 3 mmHg.       Disposition Plan  :    Status is: Inpatient  DVT Prophylaxis  :    SCDs Start: 12/31/23 0110   Lab Results  Component Value Date   PLT 93 (L) 01/02/2024    Diet :  Diet Order             Diet NPO time specified Except for: Sips with Meds  Diet effective midnight                    Inpatient Medications  Scheduled Meds:  FLUoxetine   20 mg Oral Daily   fluticasone   2 spray Each Nare Daily   folic acid   1 mg Oral Daily   loratadine   10 mg Oral Daily   multivitamin with minerals  1 tablet Oral Daily   pantoprazole  (PROTONIX ) IV  40 mg Intravenous Q12H   sucralfate   1 g Oral TID   thiamine   100 mg Oral Daily   Or   thiamine   100 mg Intravenous Daily   traZODone   50 mg Oral QHS   Continuous Infusions:  potassium chloride  10 mEq (01/02/24 1105)   PRN Meds:.alum & mag hydroxide-simeth, guaiFENesin , LORazepam  **OR** LORazepam , menthol , ondansetron  **OR** ondansetron  (ZOFRAN ) IV  Antibiotics  :    Anti-infectives (From admission, onward)    None         Objective:   Vitals:   01/02/24 0000 01/02/24 0400 01/02/24 0700 01/02/24 0715  BP: 125/80 116/77 115/70 110/78  Pulse: (!) 111 85 82 85  Resp:  10 12 20   Temp:  98.8 F (37.1 C)  97.8 F (36.6 C)  TempSrc:  Oral  SpO2:  99% 100% 100%    Wt Readings from Last 3 Encounters:  11/29/23 81.2 kg  10/10/23 81.6 kg  08/30/23 81.6 kg    No intake or output data in the 24 hours ending 01/02/24 1131    Physical Exam  Awake Alert, Oriented X 3, No new F.N deficits, Normal affect Symmetrical Chest wall movement, Good air movement bilaterally, CTAB RRR,No  Gallops,Rubs or new Murmurs, No Parasternal Heave +ve B.Sounds, Abd Soft, No tenderness, No rebound - guarding or rigidity. No Cyanosis, Clubbing or edema, No new Rash or bruise       Data Review:    Recent Labs  Lab 12/30/23 1647 12/31/23 0415 01/01/24 0348 01/02/24 0237  WBC 10.8* 7.3 4.7 4.3  HGB 12.7 10.7* 10.0* 10.0*  HCT 38.6 32.3* 30.3* 29.9*  PLT 178 113* 85* 93*  MCV 85.2 86.1 85.6 86.2  MCH 28.0 28.5 28.2 28.8  MCHC 32.9 33.1 33.0 33.4  RDW 14.6 14.7 14.7 14.3  LYMPHSABS  --   --  1.2 1.1  MONOABS  --   --  0.3 0.2  EOSABS  --   --  0.0 0.0  BASOSABS  --   --  0.0 0.0    Recent Labs  Lab 12/30/23 1647 12/30/23 2046 12/31/23 0047 12/31/23 0415 12/31/23 2045 01/01/24 0348 01/02/24 0237  NA 133*  --   --  135  --  138 137  K 3.3*  --   --  4.2  --  3.0* 2.8*  CL 92*  --   --  102  --  105 105  CO2 14*  --   --  22  --  23 25  ANIONGAP 27*  --   --  11  --  10 7  GLUCOSE 114*  --   --  104*  --  90 104*  BUN 6  --   --  5*  --  5* <5*  CREATININE 1.49*  --   --  1.25*  --  0.93 0.79  AST 78*  --   --  63*  --  49* 51*  ALT 35  --   --  25  --  25 28  ALKPHOS 73  --   --  57  --  48 45  BILITOT 2.2*  --   --  1.3*  --  1.1 0.6  ALBUMIN 4.8  --   --  3.6  --  3.1* 3.0*  PROCALCITON  --   --  <0.10  --   --   --   --   LATICACIDVEN  --   --  1.2  --   --   --   --   INR  --   --  1.0  --   --   --   --   TSH  --  1.496  --   --   --   --   --   AMMONIA  --   --  32  --   --   --   --   MG  --  1.7  --  1.8  --   --  1.9  PHOS  --   --  <1.0* <1.0* 1.9* 2.4* 2.8  CALCIUM 10.0  --   --  9.3  --  8.4* 8.3*      Recent Labs  Lab 12/30/23 1647 12/30/23 2046 12/31/23 0047 12/31/23 0415 01/01/24 0348 01/02/24 0237  PROCALCITON  --   --  <  0.10  --   --   --   LATICACIDVEN  --   --  1.2  --   --   --   INR  --   --  1.0  --   --   --   TSH  --  1.496  --   --   --   --   AMMONIA  --   --  32  --   --   --   MG  --  1.7  --  1.8  --  1.9  CALCIUM  10.0  --   --  9.3 8.4* 8.3*    --------------------------------------------------------------------------------------------------------------- Lab Results  Component Value Date   CHOL 190 08/07/2023   HDL 78.50 08/07/2023   LDLCALC 91 08/07/2023   TRIG 100.0 08/07/2023   CHOLHDL 2 08/07/2023    Lab Results  Component Value Date   HGBA1C 5.9 08/07/2023   Recent Labs    12/30/23 2046 12/31/23 0047  TSH 1.496  --   FREET4  --  0.56*   No results for input(s): VITAMINB12, FOLATE, FERRITIN, TIBC, IRON, RETICCTPCT in the last 72 hours. ------------------------------------------------------------------------------------------------------------------ Cardiac Enzymes No results for input(s): CKMB, TROPONINI, MYOGLOBIN in the last 168 hours.  Invalid input(s): CK  Micro Results Recent Results (from the past 240 hours)  Resp panel by RT-PCR (RSV, Flu A&B, Covid) Anterior Nasal Swab     Status: None   Collection Time: 12/30/23 10:36 PM   Specimen: Anterior Nasal Swab  Result Value Ref Range Status   SARS Coronavirus 2 by RT PCR NEGATIVE NEGATIVE Final   Influenza A by PCR NEGATIVE NEGATIVE Final   Influenza B by PCR NEGATIVE NEGATIVE Final    Comment: (NOTE) The Xpert Xpress SARS-CoV-2/FLU/RSV plus assay is intended as an aid in the diagnosis of influenza from Nasopharyngeal swab specimens and should not be used as a sole basis for treatment. Nasal washings and aspirates are unacceptable for Xpert Xpress SARS-CoV-2/FLU/RSV testing.  Fact Sheet for Patients: BloggerCourse.com  Fact Sheet for Healthcare Providers: SeriousBroker.it  This test is not yet approved or cleared by the [place]  FDA and has been authorized for detection and/or diagnosis of SARS-CoV-2 by FDA under an Emergency Use Authorization (EUA). This EUA will remain in effect (meaning this test can be used) for the duration of  the COVID-19 declaration under Section 564(b)(1) of the Act, 21 U.S.C. section 360bbb-3(b)(1), unless the authorization is terminated or revoked.     Resp Syncytial Virus by PCR NEGATIVE NEGATIVE Final    Comment: (NOTE) Fact Sheet for Patients: BloggerCourse.com  Fact Sheet for Healthcare Providers: SeriousBroker.it  This test is not yet approved or cleared by the [place]  FDA and has been authorized for detection and/or diagnosis of SARS-CoV-2 by FDA under an Emergency Use Authorization (EUA). This EUA will remain in effect (meaning this test can be used) for the duration of the COVID-19 declaration under Section 564(b)(1) of the Act, 21 U.S.C. section 360bbb-3(b)(1), unless the authorization is terminated or revoked.  Performed at [facility], [address] [place], [place] [address]     Radiology Report No results found.    Signature  -   Brayton Lye M.D on 01/02/2024 at 11:31 AM   -  To page go to [email or web address]

## 2024-01-03 DIAGNOSIS — N179 Acute kidney failure, unspecified: Secondary | ICD-10-CM | POA: Diagnosis not present

## 2024-01-03 LAB — CBC WITH DIFFERENTIAL/PLATELET
Abs Immature Granulocytes: 0.03 K/uL (ref 0.00–0.07)
Basophils Absolute: 0 K/uL (ref 0.0–0.1)
Basophils Relative: 0 %
Eosinophils Absolute: 0 K/uL (ref 0.0–0.5)
Eosinophils Relative: 0 %
HCT: 30.6 % — ABNORMAL LOW (ref 36.0–46.0)
Hemoglobin: 9.9 g/dL — ABNORMAL LOW (ref 12.0–15.0)
Immature Granulocytes: 0 %
Lymphocytes Relative: 16 %
Lymphs Abs: 1.4 K/uL (ref 0.7–4.0)
MCH: 28 pg (ref 26.0–34.0)
MCHC: 32.4 g/dL (ref 30.0–36.0)
MCV: 86.4 fL (ref 80.0–100.0)
Monocytes Absolute: 0.5 K/uL (ref 0.1–1.0)
Monocytes Relative: 6 %
Neutro Abs: 7 K/uL (ref 1.7–7.7)
Neutrophils Relative %: 78 %
Platelets: 106 K/uL — ABNORMAL LOW (ref 150–400)
RBC: 3.54 MIL/uL — ABNORMAL LOW (ref 3.87–5.11)
RDW: 14.2 % (ref 11.5–15.5)
WBC: 9 K/uL (ref 4.0–10.5)
nRBC: 0 % (ref 0.0–0.2)

## 2024-01-03 LAB — COMPREHENSIVE METABOLIC PANEL WITH GFR
ALT: 41 U/L (ref 0–44)
AST: 73 U/L — ABNORMAL HIGH (ref 15–41)
Albumin: 3.2 g/dL — ABNORMAL LOW (ref 3.5–5.0)
Alkaline Phosphatase: 47 U/L (ref 38–126)
Anion gap: 10 (ref 5–15)
BUN: <5 mg/dL — ABNORMAL LOW (ref 6–20)
CO2: 25 mmol/L (ref 22–32)
Calcium: 8.6 mg/dL — ABNORMAL LOW (ref 8.9–10.3)
Chloride: 100 mmol/L (ref 98–111)
Creatinine, Ser: 0.88 mg/dL (ref 0.44–1.00)
GFR, Estimated: >60 mL/min (ref >60)
Glucose, Bld: 97 mg/dL (ref 70–99)
Potassium: 3.4 mmol/L — ABNORMAL LOW (ref 3.5–5.1)
Sodium: 135 mmol/L (ref 135–145)
Total Bilirubin: 0.8 mg/dL (ref 0.0–1.2)
Total Protein: 6 g/dL — ABNORMAL LOW (ref 6.5–8.1)

## 2024-01-03 LAB — MAGNESIUM: Magnesium: 1.9 mg/dL (ref 1.7–2.4)

## 2024-01-03 LAB — PHOSPHORUS: Phosphorus: 2.3 mg/dL — ABNORMAL LOW (ref 2.5–4.6)

## 2024-01-03 MED ORDER — K PHOS MONO-SOD PHOS DI & MONO 155-852-130 MG PO TABS
500.0000 mg | ORAL_TABLET | Freq: Three times a day (TID) | ORAL | Status: DC
  Administered 2024-01-03: 500 mg via ORAL
  Filled 2024-01-03: qty 2

## 2024-01-03 MED ORDER — ENSURE PLUS HIGH PROTEIN PO LIQD: 237.0000 mL | Freq: Three times a day (TID) | ORAL | Status: DC

## 2024-01-03 MED ORDER — SODIUM PHOSPHATES 45 MMOLE/15ML IV SOLN
30.0000 mmol | Freq: Once | INTRAVENOUS | Status: AC
  Administered 2024-01-03: 30 mmol via INTRAVENOUS
  Filled 2024-01-03: qty 10

## 2024-01-03 MED ORDER — POTASSIUM CHLORIDE 20 MEQ PO PACK
60.0000 meq | PACK | Freq: Once | ORAL | Status: AC
Start: 2024-01-03 — End: 2024-01-03
  Administered 2024-01-03: 60 meq via ORAL
  Filled 2024-01-03: qty 3

## 2024-01-03 NOTE — Progress Notes (Addendum)
   01/03/24 0842  Mobility  Activity Ambulated independently  Level of Assistance Independent  Assistive Device None  Distance Ambulated (ft) 400 ft  Activity Response Tolerated fair  Mobility Referral Yes  Mobility visit 1 Mobility  Mobility Specialist Start Time (ACUTE ONLY) [id number]  Mobility Specialist Stop Time (ACUTE ONLY) 0848  Mobility Specialist Time Calculation (min) (ACUTE ONLY) 6 min   Mobility Specialist: Progress Note  Pre-Mobility:      HR 90 Post-Mobility:    HR 101  Pt agreeable to mobility session - received in bed. Pt with coughing towards EOS, stated there goes my acid reflux. Returned to standing in room with all needs met - call bell within reach.   __________________________________________________________________________ During Mobility: HR 99   Pt agreeable to mobility session - received in bed. C/o stomach upset.  Returned to bed with all needs met - call bell within reach.   Virgle Boards, BS Mobility Specialist Please contact via SecureChat or  Rehab office at [phone].

## 2024-01-03 NOTE — Plan of Care (Signed)

## 2024-01-03 NOTE — Progress Notes (Signed)
 PROGRESS NOTE                                                                                                                                                                                                             Patient Demographics:    Danielle Reilly, is a 48 y.o. female, DOB - [birth year], FMW:[id number]  Outpatient Primary MD for the patient is [name], [name], MD    LOS - 4  Admit date - 12/30/2023    Chief Complaint  Patient presents with   Chest Pain   Alcohol Intoxication       Brief Narrative (HPI from H&P)   48 y.o. female with medical history significant of alcohol abuse, GERD, anxiety. Presented with chest pain/indigestion, SOB, significant N/V, also reports she may be in alcohol withdrawal.  Last drink was on 12/29/2023. Patient reports that she has been drinking approximately fifth of hard liquor per day for the past 7 days. Denies having any withdrawal seizures.  In the ED, patient noted to be significantly tachycardic with generalized tremors, otherwise stable.    Labs showed K-->3.3, creatinine 1.49, bicarb 14, WBC 10.8, AST 78, ALT 35, T. bili 2.2, troponin 22--> 24. CTA chest with no evidence of PE, noted severe hepatic steatosis.  CT abdomen/pelvis noted for possible residual of a recently passed calculus, questionable cholelithiasis.  Right upper quadrant ultrasound showed no gallstones or wall thickening visualized, just fatty liver.  Patient admitted for further management.    Subjective:    Danielle Reilly today still reports difficulty with swallowing, poor appetite.    Assessment  & Plan :   Alcohol abuse/withdrawal Patient reports that she has been drinking approximately fifth of hard liquor per day for the past 7 days.  In mild DTs, counseled to quit, continue CIWA protocol and monitor.   Ongoing odynophagia.  Severe erosive esophagitis -Input greatly appreciated, status post endoscopy 9/17  significant for erosive gastritis, on Carafate  and Protonix , remains with difficulty swallowing liquid diet, will hold on advancing   Left ear fullness.  Likely blocked eustachian tube, Claritin  and Flonase , monitor.    AKI with hypokalemia, hypophosphatemia Hydrate, AKI improving electrolytes replaced  Severe hypokalemia Hypophosphatemia - replaced   Mild transaminitis AST elevated, T. bili elevated at 1.3, right upper quadrant ultrasound shows fatty liver, to abstain from alcohol.  GI consulted.  Question if she has underlying cirrhosis as she already has thrombocytopenia   Thrombocytopenia/normocytic anemia Likely 2/2 alcohol abuse Daily CBC, improving   Mild rhabdomyolysis Hydrated   Mildly elevated troponin Mild from demand ischemia caused by dehydration and hypotension, trend is flat and in non-ACS pattern, chest pain-free stable echocardiogram.   Low free T4 Free T4-->0.56, TSH 1.496 Repeat as outpatient in 4 to 8 weeks likely sick euthyroid syndrome   GERD Continue PPI           Condition - Fair  Family Communication  :  None present  Code Status :  Full  Consults  :  GI  PUD Prophylaxis :  PPI   Procedures  :     CT chest abdomen pelvis.  No PE - 1. Mild fullness of the right collecting system with no frank hydronephrosis. This may reflect the residual of a recently passed calculus or reflect chronic changes of obstructive uropathy or reflux. 2. Question cholelithiasis. Recommend correlation with liver function tests ir consider right upper quadrant ultrasound for further evaluation. 3. Hepatic steatosis. 4. Status post hysterectomy.  Right upper quadrant ultrasound.  Fatty liver  Echocardiogram - 1. Left ventricular ejection fraction, by estimation, is 60 to 65%. The left ventricle has normal function. The left ventricle has no regional wall motion abnormalities. Left ventricular diastolic parameters were normal.  2. Right ventricular systolic function  is normal. The right ventricular size is normal.  3. The mitral valve is normal in structure. No evidence of mitral valve regurgitation. No evidence of mitral stenosis.  4. The aortic valve is normal in structure. Aortic valve regurgitation is not visualized. No aortic stenosis is present.  5. The inferior vena cava is normal in size with greater than 50% respiratory variability, suggesting right atrial pressure of 3 mmHg.       Disposition Plan  :    Status is: Inpatient  DVT Prophylaxis  :    SCDs Start: 12/31/23 0110   Lab Results  Component Value Date   PLT 106 (L) 01/03/2024    Diet :  Diet Order             Diet full liquid Room service appropriate? Yes; Fluid consistency: Thin  Diet effective now                    Inpatient Medications  Scheduled Meds:  feeding supplement  237 mL Oral TID BM   FLUoxetine   20 mg Oral Daily   fluticasone   2 spray Each Nare Daily   folic acid   1 mg Oral Daily   loratadine   10 mg Oral Daily   multivitamin with minerals  1 tablet Oral Daily   pantoprazole  (PROTONIX ) IV  40 mg Intravenous Q12H   sucralfate   1 g Oral TID   thiamine   100 mg Oral Daily   Or   thiamine   100 mg Intravenous Daily   traZODone   50 mg Oral QHS   Continuous Infusions:  sodium PHOSPHATE  IVPB (in mmol) 30 mmol (01/03/24 1107)   PRN Meds:.alum & mag hydroxide-simeth, guaiFENesin , menthol , ondansetron  **OR** ondansetron  (ZOFRAN ) IV  Antibiotics  :    Anti-infectives (From admission, onward)    None         Objective:   Vitals:   01/03/24 0409 01/03/24 0517 01/03/24 0737 01/03/24 0738  BP: 131/74   119/81  Pulse: 91     Resp: 19  20 18   Temp: 98.5 F (36.9 C) 98.1 F (  36.7 C)    TempSrc: Oral     SpO2: 96%  92%   Weight:      Height:        Wt Readings from Last 3 Encounters:  01/02/24 77.1 kg  11/29/23 81.2 kg  10/10/23 81.6 kg     Intake/Output Summary (Last 24 hours) at 01/03/2024 1330 Last data filed at 01/02/2024 2025 Gross  per 24 hour  Intake 540 ml  Output --  Net 540 ml      Physical Exam  Awake Alert, Oriented X 3,  Symmetrical Chest wall movement, Good air movement bilaterally, CTAB RRR,No Gallops,Rubs or new Murmurs, No Parasternal Heave +ve B.Sounds, Abd Soft, No tenderness, No rebound - guarding or rigidity. No Cyanosis, Clubbing or edema, No new Rash or bruise        Data Review:    Recent Labs  Lab 12/30/23 1647 12/31/23 0415 01/01/24 0348 01/02/24 0237 01/02/24 1401 01/03/24 0228  WBC 10.8* 7.3 4.7 4.3  --  9.0  HGB 12.7 10.7* 10.0* 10.0* 12.2 9.9*  HCT 38.6 32.3* 30.3* 29.9* 36.0 30.6*  PLT 178 113* 85* 93*  --  106*  MCV 85.2 86.1 85.6 86.2  --  86.4  MCH 28.0 28.5 28.2 28.8  --  28.0  MCHC 32.9 33.1 33.0 33.4  --  32.4  RDW 14.6 14.7 14.7 14.3  --  14.2  LYMPHSABS  --   --  1.2 1.1  --  1.4  MONOABS  --   --  0.3 0.2  --  0.5  EOSABS  --   --  0.0 0.0  --  0.0  BASOSABS  --   --  0.0 0.0  --  0.0    Recent Labs  Lab 12/30/23 1647 12/30/23 1647 12/30/23 2046 12/31/23 0047 12/31/23 0415 12/31/23 2045 01/01/24 0348 01/02/24 0237 01/02/24 1401 01/03/24 0228  NA 133*  --   --   --  135  --  138 137 139 135  K 3.3*  --   --   --  4.2  --  3.0* 2.8* 3.7 3.4*  CL 92*  --   --   --  102  --  105 105 104 100  CO2 14*  --   --   --  22  --  23 25  --  25  ANIONGAP 27*  --   --   --  11  --  10 7  --  10  GLUCOSE 114*  --   --   --  104*  --  90 104* 107* 97  BUN 6  --   --   --  5*  --  5* <5* <3* <5*  CREATININE 1.49*  --   --   --  1.25*  --  0.93 0.79 0.80 0.88  AST 78*  --   --   --  63*  --  49* 51*  --  73*  ALT 35  --   --   --  25  --  25 28  --  41  ALKPHOS 73  --   --   --  57  --  48 45  --  47  BILITOT 2.2*  --   --   --  1.3*  --  1.1 0.6  --  0.8  ALBUMIN 4.8  --   --   --  3.6  --  3.1* 3.0*  --  3.2*  PROCALCITON  --   --   --  <  0.10  --   --   --   --   --   --   LATICACIDVEN  --   --   --  1.2  --   --   --   --   --   --   INR  --   --   --  1.0   --   --   --   --   --   --   TSH  --   --  1.496  --   --   --   --   --   --   --   AMMONIA  --   --   --  32  --   --   --   --   --   --   MG  --   --  1.7  --  1.8  --   --  1.9  --  1.9  PHOS  --    < >  --  <1.0* <1.0* 1.9* 2.4* 2.8  --  2.3*  CALCIUM 10.0  --   --   --  9.3  --  8.4* 8.3*  --  8.6*   < > = values in this interval not displayed.      Recent Labs  Lab 12/30/23 1647 12/30/23 2046 12/31/23 0047 12/31/23 0415 01/01/24 0348 01/02/24 0237 01/03/24 0228  PROCALCITON  --   --  <0.10  --   --   --   --   LATICACIDVEN  --   --  1.2  --   --   --   --   INR  --   --  1.0  --   --   --   --   TSH  --  1.496  --   --   --   --   --   AMMONIA  --   --  32  --   --   --   --   MG  --  1.7  --  1.8  --  1.9 1.9  CALCIUM 10.0  --   --  9.3 8.4* 8.3* 8.6*    --------------------------------------------------------------------------------------------------------------- Lab Results  Component Value Date   CHOL 190 08/07/2023   HDL 78.50 08/07/2023   LDLCALC 91 08/07/2023   TRIG 100.0 08/07/2023   CHOLHDL 2 08/07/2023    Lab Results  Component Value Date   HGBA1C 5.9 08/07/2023   No results for input(s): TSH, T4TOTAL, FREET4, T3FREE, THYROIDAB in the last 72 hours.  No results for input(s): VITAMINB12, FOLATE, FERRITIN, TIBC, IRON, RETICCTPCT in the last 72 hours. ------------------------------------------------------------------------------------------------------------------ Cardiac Enzymes No results for input(s): CKMB, TROPONINI, MYOGLOBIN in the last 168 hours.  Invalid input(s): CK  Micro Results Recent Results (from the past 240 hours)  Resp panel by RT-PCR (RSV, Flu A&B, Covid) Anterior Nasal Swab     Status: None   Collection Time: 12/30/23 10:36 PM   Specimen: Anterior Nasal Swab  Result Value Ref Range Status   SARS Coronavirus 2 by RT PCR NEGATIVE NEGATIVE Final   Influenza A by PCR NEGATIVE NEGATIVE Final    Influenza B by PCR NEGATIVE NEGATIVE Final    Comment: (NOTE) The Xpert Xpress SARS-CoV-2/FLU/RSV plus assay is intended as an aid in the diagnosis of influenza from Nasopharyngeal swab specimens and should not be used as a sole basis for treatment. Nasal washings and aspirates are unacceptable for Xpert Xpress SARS-CoV-2/FLU/RSV testing.  Fact  Sheet for Patients: BloggerCourse.com  Fact Sheet for Healthcare Providers: SeriousBroker.it  This test is not yet approved or cleared by the [place]  FDA and has been authorized for detection and/or diagnosis of SARS-CoV-2 by FDA under an Emergency Use Authorization (EUA). This EUA will remain in effect (meaning this test can be used) for the duration of the COVID-19 declaration under Section 564(b)(1) of the Act, 21 U.S.C. section 360bbb-3(b)(1), unless the authorization is terminated or revoked.     Resp Syncytial Virus by PCR NEGATIVE NEGATIVE Final    Comment: (NOTE) Fact Sheet for Patients: BloggerCourse.com  Fact Sheet for Healthcare Providers: SeriousBroker.it  This test is not yet approved or cleared by the [place]  FDA and has been authorized for detection and/or diagnosis of SARS-CoV-2 by FDA under an Emergency Use Authorization (EUA). This EUA will remain in effect (meaning this test can be used) for the duration of the COVID-19 declaration under Section 564(b)(1) of the Act, 21 U.S.C. section 360bbb-3(b)(1), unless the authorization is terminated or revoked.  Performed at [facility], [address], [place], [place] [address]     Radiology Report No results found.    Signature  -   Brayton Lye M.D on 01/03/2024 at 1:30 PM   -  To page go to [email or web address]

## 2024-01-04 ENCOUNTER — Other Ambulatory Visit (HOSPITAL_COMMUNITY): Payer: Self-pay

## 2024-01-04 DIAGNOSIS — N179 Acute kidney failure, unspecified: Secondary | ICD-10-CM | POA: Diagnosis not present

## 2024-01-04 LAB — CBC WITH DIFFERENTIAL/PLATELET
Abs Immature Granulocytes: 0.03 K/uL (ref 0.00–0.07)
Basophils Absolute: 0 K/uL (ref 0.0–0.1)
Basophils Relative: 0 %
Eosinophils Absolute: 0.1 K/uL (ref 0.0–0.5)
Eosinophils Relative: 1 %
HCT: 31.2 % — ABNORMAL LOW (ref 36.0–46.0)
Hemoglobin: 10.2 g/dL — ABNORMAL LOW (ref 12.0–15.0)
Immature Granulocytes: 1 %
Lymphocytes Relative: 24 %
Lymphs Abs: 1.4 K/uL (ref 0.7–4.0)
MCH: 28.3 pg (ref 26.0–34.0)
MCHC: 32.7 g/dL (ref 30.0–36.0)
MCV: 86.4 fL (ref 80.0–100.0)
Monocytes Absolute: 0.6 K/uL (ref 0.1–1.0)
Monocytes Relative: 11 %
Neutro Abs: 3.6 K/uL (ref 1.7–7.7)
Neutrophils Relative %: 63 %
Platelets: 140 K/uL — ABNORMAL LOW (ref 150–400)
RBC: 3.61 MIL/uL — ABNORMAL LOW (ref 3.87–5.11)
RDW: 14.5 % (ref 11.5–15.5)
WBC: 5.7 K/uL (ref 4.0–10.5)
nRBC: 0 % (ref 0.0–0.2)

## 2024-01-04 LAB — COMPREHENSIVE METABOLIC PANEL WITH GFR
ALT: 41 U/L (ref 0–44)
AST: 57 U/L — ABNORMAL HIGH (ref 15–41)
Albumin: 3.1 g/dL — ABNORMAL LOW (ref 3.5–5.0)
Alkaline Phosphatase: 46 U/L (ref 38–126)
Anion gap: 8 (ref 5–15)
BUN: <5 mg/dL — ABNORMAL LOW (ref 6–20)
CO2: 26 mmol/L (ref 22–32)
Calcium: 8.8 mg/dL — ABNORMAL LOW (ref 8.9–10.3)
Chloride: 104 mmol/L (ref 98–111)
Creatinine, Ser: 0.9 mg/dL (ref 0.44–1.00)
GFR, Estimated: >60 mL/min (ref >60)
Glucose, Bld: 100 mg/dL — ABNORMAL HIGH (ref 70–99)
Potassium: 3.3 mmol/L — ABNORMAL LOW (ref 3.5–5.1)
Sodium: 138 mmol/L (ref 135–145)
Total Bilirubin: 0.5 mg/dL (ref 0.0–1.2)
Total Protein: 5.9 g/dL — ABNORMAL LOW (ref 6.5–8.1)

## 2024-01-04 LAB — MAGNESIUM: Magnesium: 1.9 mg/dL (ref 1.7–2.4)

## 2024-01-04 LAB — SURGICAL PATHOLOGY

## 2024-01-04 LAB — PHOSPHORUS: Phosphorus: 3.2 mg/dL (ref 2.5–4.6)

## 2024-01-04 MED ORDER — THIAMINE HCL 100 MG PO TABS
100.0000 mg | ORAL_TABLET | Freq: Every day | ORAL | 0 refills | Status: AC
  Filled 2024-01-04: qty 30, 30d supply, fill #0

## 2024-01-04 MED ORDER — SUCRALFATE 1 GM/10ML PO SUSP
1.0000 g | Freq: Three times a day (TID) | ORAL | 0 refills | Status: AC
  Filled 2024-01-04: qty 833, 28d supply, fill #0

## 2024-01-04 MED ORDER — PANTOPRAZOLE SODIUM 40 MG PO TBEC
40.0000 mg | DELAYED_RELEASE_TABLET | Freq: Two times a day (BID) | ORAL | 0 refills | Status: DC
  Filled 2024-01-04: qty 120, 60d supply, fill #0

## 2024-01-04 MED ORDER — FOLIC ACID 1 MG PO TABS
1.0000 mg | ORAL_TABLET | Freq: Every day | ORAL | 0 refills | Status: AC
  Filled 2024-01-04: qty 30, 30d supply, fill #0

## 2024-01-04 NOTE — Discharge Summary (Signed)
 Physician Discharge Summary  Danielle Reilly FMW:[id number] DOB: 31-May-1975 DOA: 12/30/2023  PCP: Purcell Emil Schanz, MD  Admit date: 12/30/2023 Discharge date: 01/04/2024  Admitted From: (Home) Disposition:  (Home)  Recommendations for Outpatient Follow-up:  Follow up with PCP in 1-2 weeks Please obtain BMP/CBC/LFT in one week Please follow up on the final results of her biopsy obtained during endoscopy, as well she will need repeat endoscopy to ensure resolution of her severe esophagitis    Diet recommendation: Full liquid diet, she can advance as tolerated Brief/Interim Summary:    48 y.o. female with medical history significant of alcohol abuse, GERD, anxiety. Presented with chest pain/indigestion, SOB, significant N/V, also reports she may be in alcohol withdrawal.  Last drink was on 12/29/2023. Patient reports that she has been drinking approximately fifth of hard liquor per day for the past 7 days. Denies having any withdrawal seizures.  In the ED, patient noted to be significantly tachycardic with generalized tremors, otherwise stable.     Labs showed K-->3.3, creatinine 1.49, bicarb 14, WBC 10.8, AST 78, ALT 35, T. bili 2.2, troponin 22--> 24. CTA chest with no evidence of PE, noted severe hepatic steatosis.  CT abdomen/pelvis noted for possible residual of a recently passed calculus, questionable cholelithiasis.  Right upper quadrant ultrasound showed no gallstones or wall thickening visualized, just fatty liver.  Patient admitted for further management.   Alcohol abuse/withdrawal Patient reports that she has been drinking approximately fifth of hard liquor per day for the past 7 days.  In mild DTs, counseled to quit, was treated with CIWA protocol, no further withdrawals, discharged on thiamine  and folic acid     Odynophagia.  Severe erosive esophagitis Erosive gastropathy Erythematous duodenopathy -Ports odynophagia, status post endoscopy, significant for above,  recommendation for Carafate , and Protonix  40 mg p.o. twice daily for 8 weeks, she will will need repeat endoscopy in 8 weeks to ensure healing.  Discussed with her that she will need to follow-up final results of biopsies obtained during endoscopy - Will be discharged on full liquid diet and she can advance as tolerated.     Left ear fullness.  Likely blocked eustachian tube, Claritin  and Flonase , monitor.     AKI with hypokalemia, hypophosphatemia Resolved   Severe hypokalemia Hypophosphatemia - replaced   Mild transaminitis AST elevated, T. bili elevated at 1.3, right upper quadrant ultrasound shows fatty liver, to abstain from alcohol.     Thrombocytopenia/normocytic anemia Likely 2/2 alcohol abuse Daily CBC, improving   Mild rhabdomyolysis Hydrated   Mildly elevated troponin Mild from demand ischemia caused by dehydration and hypotension, trend is flat and in non-ACS pattern, chest pain-free stable echocardiogram.   Low free T4 Free T4-->0.56, TSH 1.496 Repeat as outpatient in 4 to 8 weeks likely sick euthyroid syndrome   GERD Continue PPI      Discharge Diagnoses:  Principal Problem:   AKI (acute kidney injury) (HCC) Active Problems:   Hypokalemia   Nausea and vomiting   Tachycardia   GERD (gastroesophageal reflux disease)   Alcohol abuse with other alcohol-induced disorder (HCC)   Dehydration   Chest pain   RUQ abdominal tenderness    Discharge Instructions  Discharge Instructions     Diet - low sodium heart healthy   Complete by: As directed    Discharge instructions   Complete by: As directed    Follow with Primary MD Purcell Emil Schanz, MD in 7 days   Get CBC, CMP, checked  by Primary MD next visit.  Activity: As tolerated with Full fall precautions use walker/cane & assistance as needed   Disposition Home    Diet: Full liquid diet, advance as tolerated   On your next visit with your primary care physician please Get Medicines  reviewed and adjusted.   Please request your Prim.MD to go over all Hospital Tests and Procedure/Radiological results at the follow up, please get all Hospital records sent to your Prim MD by signing hospital release before you go home.   If you experience worsening of your admission symptoms, develop shortness of breath, life threatening emergency, suicidal or homicidal thoughts you must seek medical attention immediately by calling 911 or calling your MD immediately  if symptoms less severe.  You Must read complete instructions/literature along with all the possible adverse reactions/side effects for all the Medicines you take and that have been prescribed to you. Take any new Medicines after you have completely understood and accpet all the possible adverse reactions/side effects.   Do not drive, operating heavy machinery, perform activities at heights, swimming or participation in water activities or provide baby sitting services if your were admitted for syncope or siezures until you have seen by Primary MD or a Neurologist and advised to do so again.  Do not drive when taking Pain medications.    Do not take more than prescribed Pain, Sleep and Anxiety Medications  Special Instructions: If you have smoked or chewed Tobacco  in the last 2 yrs please stop smoking, stop any regular Alcohol  and or any Recreational drug use.  Wear Seat belts while driving.   Please note  You were cared for by a hospitalist during your hospital stay. If you have any questions about your discharge medications or the care you received while you were in the hospital after you are discharged, you can call the unit and asked to speak with the hospitalist on call if the hospitalist that took care of you is not available. Once you are discharged, your primary care physician will handle any further medical issues. Please note that NO REFILLS for any discharge medications will be authorized once you are discharged,  as it is imperative that you return to your primary care physician (or establish a relationship with a primary care physician if you do not have one) for your aftercare needs so that they can reassess your need for medications and monitor your lab values.   Increase activity slowly   Complete by: As directed       Allergies as of 01/04/2024       Reactions   Fluconazole  Hives   Hydrocodone  Nausea And Vomiting        Medication List     TAKE these medications    Azelastine  HCl 137 MCG/SPRAY Soln PLACE 2 SPRAYS INTO BOTH NOSTRILS 2 (TWO) TIMES DAILY. USE IN EACH NOSTRIL AS DIRECTED   FLUoxetine  20 MG capsule Commonly known as: PROZAC  Take 1 capsule (20 mg total) by mouth daily.   fluticasone  50 MCG/ACT nasal spray Commonly known as: FLONASE  Place 1 spray into both nostrils daily as needed for allergies or rhinitis.   folic acid  1 MG tablet Commonly known as: FOLVITE  Take 1 tablet (1 mg total) by mouth daily. Start taking on: January 05, 2024   LORazepam  1 MG tablet Commonly known as: Ativan  Take 1 tablet (1 mg total) by mouth 3 (three) times daily as needed for anxiety.   MULTIVITAMINS PO Take 1 tablet by mouth daily.   naltrexone  50 MG  tablet Commonly known as: DEPADE Take 1 tablet (50 mg total) by mouth daily.   pantoprazole  40 MG tablet Commonly known as: PROTONIX  Take 1 tablet (40 mg total) by mouth 2 (two) times daily. Switch for any other PPI at similar dose and frequency   sucralfate  1 GM/10ML suspension Commonly known as: CARAFATE  Take 10 mLs (1 g total) by mouth 3 (three) times daily.   thiamine  100 MG tablet Commonly known as: Vitamin B-1 Take 1 tablet (100 mg total) by mouth daily. Start taking on: January 05, 2024   traZODone  50 MG tablet Commonly known as: DESYREL  Take 1-2 tablets (50-100 mg total) by mouth at bedtime.        Allergies  Allergen Reactions   Fluconazole  Hives   Hydrocodone  Nausea And Vomiting     Consultations: GI  Procedures/Studies: ECHOCARDIOGRAM COMPLETE Result Date: 12/31/2023    ECHOCARDIOGRAM REPORT   Patient Name:   [facility] [place] Date of Exam: 12/31/2023 Medical Rec #:  [id number]        Height:       66.0 in Accession #:    [id number]       Weight:       179.0 lb Date of Birth:  July 10, 1975         BSA:          1.908 m Patient Age:    48 years         BP:           125/86 mmHg Patient Gender: F                HR:           104 bpm. Exam Location:  Inpatient Procedure: 2D Echo, Cardiac Doppler and Color Doppler (Both Spectral and Color            Flow Doppler were utilized during procedure). Indications:    Chest Pain R07.9  History:        Patient has no prior history of Echocardiogram examinations.  Sonographer:    Jayson Gaskins Referring Phys: [id number] ANASTASSIA DOUTOVA IMPRESSIONS  1. Left ventricular ejection fraction, by estimation, is 60 to 65%. The left ventricle has normal function. The left ventricle has no regional wall motion abnormalities. Left ventricular diastolic parameters were normal.  2. Right ventricular systolic function is normal. The right ventricular size is normal.  3. The mitral valve is normal in structure. No evidence of mitral valve regurgitation. No evidence of mitral stenosis.  4. The aortic valve is normal in structure. Aortic valve regurgitation is not visualized. No aortic stenosis is present.  5. The inferior vena cava is normal in size with greater than 50% respiratory variability, suggesting right atrial pressure of 3 mmHg. FINDINGS  Left Ventricle: Left ventricular ejection fraction, by estimation, is 60 to 65%. The left ventricle has normal function. The left ventricle has no regional wall motion abnormalities. The left ventricular internal cavity size was normal in size. There is  no left ventricular hypertrophy. Left ventricular diastolic parameters were normal. Right Ventricle: The right ventricular size is normal. No increase in right ventricular wall  thickness. Right ventricular systolic function is normal. Left Atrium: Left atrial size was normal in size. Right Atrium: Right atrial size was normal in size. Pericardium: There is no evidence of pericardial effusion. Mitral Valve: The mitral valve is normal in structure. No evidence of mitral valve regurgitation. No evidence of mitral valve stenosis. Tricuspid Valve: The tricuspid valve is normal  in structure. Tricuspid valve regurgitation is not demonstrated. No evidence of tricuspid stenosis. Aortic Valve: The aortic valve is normal in structure. Aortic valve regurgitation is not visualized. No aortic stenosis is present. Aortic valve mean gradient measures 4.0 mmHg. Aortic valve peak gradient measures 6.2 mmHg. Aortic valve area, by VTI measures 2.56 cm. Pulmonic Valve: The pulmonic valve was normal in structure. Pulmonic valve regurgitation is not visualized. No evidence of pulmonic stenosis. Aorta: The aortic root is normal in size and structure. Venous: The inferior vena cava is normal in size with greater than 50% respiratory variability, suggesting right atrial pressure of 3 mmHg. IAS/Shunts: No atrial level shunt detected by color flow Doppler.  LEFT VENTRICLE PLAX 2D LVIDd:         4.50 cm   Diastology LVIDs:         3.20 cm   LV e' medial:    8.92 cm/s LV PW:         1.00 cm   LV E/e' medial:  7.6 LV IVS:        0.80 cm   LV e' lateral:   12.40 cm/s LVOT diam:     1.90 cm   LV E/e' lateral: 5.5 LV SV:         56 LV SV Index:   29 LVOT Area:     2.84 cm  RIGHT VENTRICLE RV S prime:     18.10 cm/s TAPSE (M-mode): 3.5 cm LEFT ATRIUM             Index        RIGHT ATRIUM           Index LA Vol (A2C):   34.8 ml 18.24 ml/m  RA Area:     12.00 cm LA Vol (A4C):   24.5 ml 12.84 ml/m  RA Volume:   23.40 ml  12.27 ml/m LA Biplane Vol: 30.1 ml 15.78 ml/m  AORTIC VALVE AV Area (Vmax):    2.68 cm AV Area (Vmean):   2.44 cm AV Area (VTI):     2.56 cm AV Vmax:           124.00 cm/s AV Vmean:          95.700  cm/s AV VTI:            0.217 m AV Peak Grad:      6.2 mmHg AV Mean Grad:      4.0 mmHg LVOT Vmax:         117.00 cm/s LVOT Vmean:        82.500 cm/s LVOT VTI:          0.196 m LVOT/AV VTI ratio: 0.90  AORTA Ao Root diam: 2.90 cm MITRAL VALVE MV Area (PHT): 6.71 cm    SHUNTS MV Decel Time: 113 msec    Systemic VTI:  0.20 m MV E velocity: 67.60 cm/s  Systemic Diam: 1.90 cm MV A velocity: 88.00 cm/s MV E/A ratio:  0.77 Aditya Sabharwal Electronically signed by Ria Commander Signature Date/Time: 12/31/2023/5:34:09 PM    Final    US  Abdomen Limited RUQ (LIVER/GB) Result Date: 12/31/2023 CLINICAL DATA:  Right upper quadrant pain EXAM: ULTRASOUND ABDOMEN LIMITED RIGHT UPPER QUADRANT COMPARISON:  CT from the previous day. FINDINGS: Gallbladder: No gallstones or wall thickening visualized. No sonographic Murphy sign noted by sonographer. Common bile duct: Diameter: 2.3 mm. Liver: Diffusely increased in echogenicity consistent with fatty infiltration. Portal vein is patent on color Doppler imaging with normal direction of  blood flow towards the liver. Other: None. IMPRESSION: Fatty liver. No gallbladder abnormality is noted. Electronically Signed   By: Oneil Devonshire M.D.   On: 12/31/2023 02:06   CT Angio Chest PE W and/or Wo Contrast Result Date: 12/30/2023 EXAM: CTA of the Chest with contrast for PE 12/30/2023 09:53:21 PM TECHNIQUE: CTA of the chest was performed after the administration of intravenous contrast. Multiplanar reformatted images are provided for review. MIP images are provided for review. Automated exposure control, iterative reconstruction, and/or weight based adjustment of the mA/kV was utilized to reduce the radiation dose to as low as reasonably achievable. COMPARISON: 07/27/2018 CLINICAL HISTORY: High suspicion for PE. Chest pain, alcohol withdrawal. FINDINGS: PULMONARY ARTERIES: Pulmonary arteries are adequately opacified for evaluation. No pulmonary embolism. Main pulmonary artery is normal in  caliber. MEDIASTINUM: The heart and pericardium demonstrate no acute abnormality. There is no acute abnormality of the thoracic aorta. LYMPH NODES: No mediastinal, hilar or axillary lymphadenopathy. LUNGS AND PLEURA: The lungs are without acute process. No focal consolidation or pulmonary edema. No pleural effusion or pneumothorax. UPPER ABDOMEN: Severe hepatic steatosis. SOFT TISSUES AND BONES: No acute bone or soft tissue abnormality. IMPRESSION: 1. No pulmonary embolism. 2. Severe hepatic steatosis. Electronically signed by: Dorethia Molt MD 12/30/2023 10:01 PM EDT RP Workstation: [id number]   CT ABDOMEN PELVIS W CONTRAST Result Date: 12/30/2023 CLINICAL DATA:  RLQ abdominal pain and anorexia. Evaluate for appendicits EXAM: CT ABDOMEN AND PELVIS WITH CONTRAST TECHNIQUE: Multidetector CT imaging of the abdomen and pelvis was performed using the standard protocol following bolus administration of intravenous contrast. RADIATION DOSE REDUCTION: This exam was performed according to the departmental dose-optimization program which includes automated exposure control, adjustment of the mA and/or kV according to patient size and/or use of iterative reconstruction technique. CONTRAST:  75mL OMNIPAQUE  IOHEXOL  350 MG/ML SOLN COMPARISON:  CT abdomen pelvis 10/10/2023 FINDINGS: Lower chest: No acute abnormality. Hepatobiliary: The hepatic parenchyma is diffusely hypodense compared to the splenic parenchyma consistent with fatty infiltration. No focal liver abnormality. Question gallstones within the gallbladder lumen. No CT evidence of gallbladder wall thickening. No pericholecystic fluid. No biliary dilatation. Pancreas: Diffusely atrophic. No focal lesion. Otherwise normal pancreatic contour. No surrounding inflammatory changes. No main pancreatic ductal dilatation. Spleen: Normal in size without focal abnormality. Adrenals/Urinary Tract: No adrenal nodule bilaterally. Bilateral kidneys enhance symmetrically. Mild  fullness of the right collecting system with no frank hydronephrosis. No hydronephrosis. No hydroureter. No nephroureterolithiasis. The urinary bladder is unremarkable. Stomach/Bowel: Stomach is within normal limits. No evidence of bowel wall thickening or dilatation. Appendix appears normal. Vascular/Lymphatic: No abdominal aorta or iliac aneurysm. No abdominal, pelvic, or inguinal lymphadenopathy. Reproductive: Status post hysterectomy. No adnexal masses. Other: No intraperitoneal free fluid. No intraperitoneal free gas. No organized fluid collection. Musculoskeletal: No abdominal wall hernia or abnormality. No suspicious lytic or blastic osseous lesions. No acute displaced fracture. Multilevel degenerative changes of the spine. IMPRESSION: 1. Mild fullness of the right collecting system with no frank hydronephrosis. This may reflect the residual of a recently passed calculus or reflect chronic changes of obstructive uropathy or reflux. 2. Question cholelithiasis. Recommend correlation with liver function tests ir consider right upper quadrant ultrasound for further evaluation. 3. Hepatic steatosis. 4. Status post hysterectomy. Electronically Signed   By: Morgane  Naveau M.D.   On: 12/30/2023 20:16   DG Chest Port 1 View Result Date: 12/30/2023 CLINICAL DATA:  Chest pain EXAM: PORTABLE CHEST 1 VIEW COMPARISON:  Chest x-ray 07/12/2023 FINDINGS: The heart size and mediastinal contours  are within normal limits. Both lungs are clear. The visualized skeletal structures are unremarkable. IMPRESSION: No active disease. Electronically Signed   By: Greig Pique M.D.   On: 12/30/2023 18:30      Subjective: She is tolerating full liquid diet, reports she tried some rice with soup yesterday with mild discomfort so have encouraged her to take it easy on advancing her diet  Discharge Exam: Vitals:   01/04/24 0427 01/04/24 0812  BP: 117/69 119/67  Pulse: 77 82  Resp: 17 (!) 21  Temp: 97.6 F (36.4 C) 97.7 F  (36.5 C)  SpO2: 94% 96%   Vitals:   01/03/24 2001 01/04/24 0036 01/04/24 0427 01/04/24 0812  BP:  115/72 117/69 119/67  Pulse: 94 71 77 82  Resp: 15 17 17  (!) 21  Temp:  98.4 F (36.9 C) 97.6 F (36.4 C) 97.7 F (36.5 C)  TempSrc: Oral Axillary Oral Oral  SpO2: 98% 95% 94% 96%  Weight:      Height:        General: Pt is alert, awake, not in acute distress Respiratory: CTA bilaterally, no wheezing, no rhonchi Abdominal: Soft, NT, ND, bowel sounds + Extremities: no edema, no cyanosis    The results of significant diagnostics from this hospitalization (including imaging, microbiology, ancillary and laboratory) are listed below for reference.     Microbiology: Recent Results (from the past 240 hours)  Resp panel by RT-PCR (RSV, Flu A&B, Covid) Anterior Nasal Swab     Status: None   Collection Time: 12/30/23 10:36 PM   Specimen: Anterior Nasal Swab  Result Value Ref Range Status   SARS Coronavirus 2 by RT PCR NEGATIVE NEGATIVE Final   Influenza A by PCR NEGATIVE NEGATIVE Final   Influenza B by PCR NEGATIVE NEGATIVE Final    Comment: (NOTE) The Xpert Xpress SARS-CoV-2/FLU/RSV plus assay is intended as an aid in the diagnosis of influenza from Nasopharyngeal swab specimens and should not be used as a sole basis for treatment. Nasal washings and aspirates are unacceptable for Xpert Xpress SARS-CoV-2/FLU/RSV testing.  Fact Sheet for Patients: BloggerCourse.com  Fact Sheet for Healthcare Providers: SeriousBroker.it  This test is not yet approved or cleared by the [place]  FDA and has been authorized for detection and/or diagnosis of SARS-CoV-2 by FDA under an Emergency Use Authorization (EUA). This EUA will remain in effect (meaning this test can be used) for the duration of the COVID-19 declaration under Section 564(b)(1) of the Act, 21 U.S.C. section 360bbb-3(b)(1), unless the authorization is terminated  or revoked.     Resp Syncytial Virus by PCR NEGATIVE NEGATIVE Final    Comment: (NOTE) Fact Sheet for Patients: BloggerCourse.com  Fact Sheet for Healthcare Providers: SeriousBroker.it  This test is not yet approved or cleared by the [place]  FDA and has been authorized for detection and/or diagnosis of SARS-CoV-2 by FDA under an Emergency Use Authorization (EUA). This EUA will remain in effect (meaning this test can be used) for the duration of the COVID-19 declaration under Section 564(b)(1) of the Act, 21 U.S.C. section 360bbb-3(b)(1), unless the authorization is terminated or revoked.  Performed at [facility], [address], [place], [place] [address]      Labs: BNP (last 3 results) No results for input(s): BNP in the last 8760 hours. Basic Metabolic Panel: Recent Labs  Lab 12/30/23 2046 12/31/23 0047 12/31/23 0415 12/31/23 2045 01/01/24 0348 01/02/24 0237 01/02/24 1401 01/03/24 0228 01/04/24 0653  NA  --   --  135  --  138 137 139 135 138  K  --   --  4.2  --  3.0* 2.8* 3.7 3.4* 3.3*  CL  --   --  102  --  105 105 104 100 104  CO2  --   --  22  --  23 25  --  25 26  GLUCOSE  --   --  104*  --  90 104* 107* 97 100*  BUN  --   --  5*  --  5* <5* <3* <5* <5*  CREATININE  --   --  1.25*  --  0.93 0.79 0.80 0.88 0.90  CALCIUM  --   --  9.3  --  8.4* 8.3*  --  8.6* 8.8*  MG 1.7  --  1.8  --   --  1.9  --  1.9 1.9  PHOS  --    < > <1.0* 1.9* 2.4* 2.8  --  2.3* 3.2   < > = values in this interval not displayed.   Liver Function Tests: Recent Labs  Lab 12/31/23 0415 01/01/24 0348 01/02/24 0237 01/03/24 0228 01/04/24 0653  AST 63* 49* 51* 73* 57*  ALT 25 25 28  41 41  ALKPHOS 57 48 45 47 46  BILITOT 1.3* 1.1 0.6 0.8 0.5  PROT 6.8 5.9* 5.7* 6.0* 5.9*  ALBUMIN 3.6 3.1* 3.0* 3.2* 3.1*   Recent Labs  Lab 12/30/23 1647  LIPASE 31   Recent Labs  Lab 12/31/23 0047  AMMONIA 32    CBC: Recent Labs  Lab 12/31/23 0415 01/01/24 0348 01/02/24 0237 01/02/24 1401 01/03/24 0228 01/04/24 0653  WBC 7.3 4.7 4.3  --  9.0 5.7  NEUTROABS  --  3.2 2.9  --  7.0 3.6  HGB 10.7* 10.0* 10.0* 12.2 9.9* 10.2*  HCT 32.3* 30.3* 29.9* 36.0 30.6* 31.2*  MCV 86.1 85.6 86.2  --  86.4 86.4  PLT 113* 85* 93*  --  106* 140*   Cardiac Enzymes: Recent Labs  Lab 12/31/23 0047 01/01/24 0348  CKTOTAL 510* 209   BNP: Invalid input(s): POCBNP CBG: No results for input(s): GLUCAP in the last 168 hours. D-Dimer No results for input(s): DDIMER in the last 72 hours. Hgb A1c No results for input(s): HGBA1C in the last 72 hours. Lipid Profile No results for input(s): CHOL, HDL, LDLCALC, TRIG, CHOLHDL, LDLDIRECT in the last 72 hours. Thyroid  function studies No results for input(s): TSH, T4TOTAL, T3FREE, THYROIDAB in the last 72 hours.  Invalid input(s): FREET3 Anemia work up No results for input(s): VITAMINB12, FOLATE, FERRITIN, TIBC, IRON, RETICCTPCT in the last 72 hours. Urinalysis    Component Value Date/Time   COLORURINE YELLOW 12/31/2023 1332   APPEARANCEUR TURBID (A) 12/31/2023 1332   LABSPEC 1.032 (H) 12/31/2023 1332   PHURINE 5.0 12/31/2023 1332   GLUCOSEU NEGATIVE 12/31/2023 1332   HGBUR MODERATE (A) 12/31/2023 1332   BILIRUBINUR NEGATIVE 12/31/2023 1332   KETONESUR 20 (A) 12/31/2023 1332   PROTEINUR 30 (A) 12/31/2023 1332   UROBILINOGEN 0.2 11/14/2014 1755   NITRITE NEGATIVE 12/31/2023 1332   LEUKOCYTESUR TRACE (A) 12/31/2023 1332   Sepsis Labs Recent Labs  Lab 01/01/24 0348 01/02/24 0237 01/03/24 0228 01/04/24 0653  WBC 4.7 4.3 9.0 5.7   Microbiology Recent Results (from the past 240 hours)  Resp panel by RT-PCR (RSV, Flu A&B, Covid) Anterior Nasal Swab     Status: None   Collection Time: 12/30/23 10:36 PM   Specimen: Anterior Nasal Swab  Result Value Ref Range Status   SARS Coronavirus 2 by RT PCR NEGATIVE  NEGATIVE Final   Influenza A by PCR NEGATIVE NEGATIVE Final   Influenza B by PCR NEGATIVE NEGATIVE Final    Comment: (NOTE) The Xpert Xpress SARS-CoV-2/FLU/RSV plus assay is intended as an aid in the diagnosis of influenza from Nasopharyngeal swab specimens and should not be used as a sole basis for treatment. Nasal washings and aspirates are unacceptable for Xpert Xpress SARS-CoV-2/FLU/RSV testing.  Fact Sheet for Patients: BloggerCourse.com  Fact Sheet for Healthcare Providers: SeriousBroker.it  This test is not yet approved or cleared by the [place]  FDA and has been authorized for detection and/or diagnosis of SARS-CoV-2 by FDA under an Emergency Use Authorization (EUA). This EUA will remain in effect (meaning this test can be used) for the duration of the COVID-19 declaration under Section 564(b)(1) of the Act, 21 U.S.C. section 360bbb-3(b)(1), unless the authorization is terminated or revoked.     Resp Syncytial Virus by PCR NEGATIVE NEGATIVE Final    Comment: (NOTE) Fact Sheet for Patients: BloggerCourse.com  Fact Sheet for Healthcare Providers: SeriousBroker.it  This test is not yet approved or cleared by the [place]  FDA and has been authorized for detection and/or diagnosis of SARS-CoV-2 by FDA under an Emergency Use Authorization (EUA). This EUA will remain in effect (meaning this test can be used) for the duration of the COVID-19 declaration under Section 564(b)(1) of the Act, 21 U.S.C. section 360bbb-3(b)(1), unless the authorization is terminated or revoked.  Performed at [facility], [address] [place], [place] [address]      Time coordinating discharge: Over 30 minutes  SIGNED:   Brayton Lye, MD  [facility] 01/04/2024, 10:15 AM Pager   If 7PM-7AM, please contact night-coverage [email or web address]

## 2024-01-04 NOTE — Discharge Instructions (Signed)
 Follow with Primary MD Purcell Emil Schanz, MD in 7 days   Get CBC, CMP, checked  by Primary MD next visit.    Activity: As tolerated with Full fall precautions use walker/cane & assistance as needed   Disposition Home    Diet: Full liquid diet, advance as tolerated   On your next visit with your primary care physician please Get Medicines reviewed and adjusted.   Please request your Prim.MD to go over all Hospital Tests and Procedure/Radiological results at the follow up, please get all Hospital records sent to your Prim MD by signing hospital release before you go home.   If you experience worsening of your admission symptoms, develop shortness of breath, life threatening emergency, suicidal or homicidal thoughts you must seek medical attention immediately by calling 911 or calling your MD immediately  if symptoms less severe.  You Must read complete instructions/literature along with all the possible adverse reactions/side effects for all the Medicines you take and that have been prescribed to you. Take any new Medicines after you have completely understood and accpet all the possible adverse reactions/side effects.   Do not drive, operating heavy machinery, perform activities at heights, swimming or participation in water activities or provide baby sitting services if your were admitted for syncope or siezures until you have seen by Primary MD or a Neurologist and advised to do so again.  Do not drive when taking Pain medications.    Do not take more than prescribed Pain, Sleep and Anxiety Medications  Special Instructions: If you have smoked or chewed Tobacco  in the last 2 yrs please stop smoking, stop any regular Alcohol  and or any Recreational drug use.  Wear Seat belts while driving.   Please note  You were cared for by a hospitalist during your hospital stay. If you have any questions about your discharge medications or the care you received while you were in the  hospital after you are discharged, you can call the unit and asked to speak with the hospitalist on call if the hospitalist that took care of you is not available. Once you are discharged, your primary care physician will handle any further medical issues. Please note that NO REFILLS for any discharge medications will be authorized once you are discharged, as it is imperative that you return to your primary care physician (or establish a relationship with a primary care physician if you do not have one) for your aftercare needs so that they can reassess your need for medications and monitor your lab values.

## 2024-01-04 NOTE — Progress Notes (Signed)
   01/04/24 0829  Mobility  Activity Ambulated independently  Level of Assistance Independent after set-up  Assistive Device None  Distance Ambulated (ft) 400 ft  Activity Response Tolerated fair  Mobility Referral Yes  Mobility visit 1 Mobility  Mobility Specialist Start Time (ACUTE ONLY) 0829  Mobility Specialist Stop Time (ACUTE ONLY) 0836  Mobility Specialist Time Calculation (min) (ACUTE ONLY) 7 min   Mobility Specialist: Progress Note  Post Mobility: HR 99    Pt agreeable to mobility session - received in bed. Pt was asymptomatic throughout session with no complaints. Returned to bed with all needs met - call bell within reach.    Virgle Boards, BS Mobility Specialist Please contact via SecureChat or  Rehab office at [phone].

## 2024-01-04 NOTE — Progress Notes (Signed)
 Reviewed AVS, patient expressed understanding of medications, MD follow up reviewed.   Removed IV, Site clean, dry and intact.   CCMD contacted and informed patients is being discharged.   Patient states all belongings brought to the hospital at time of admission are accounted for and packed to take home.  Picked up medications from [facility] pharmacy. Patient Transported self entrance A where family member was waiting in vehicle to transport home.

## 2024-01-07 ENCOUNTER — Telehealth: Payer: Self-pay

## 2024-01-07 NOTE — Transitions of Care (Post Inpatient/ED Visit) (Signed)
 01/07/2024  Name: Danielle Reilly MRN: [id number] DOB: Sep 12, 1975  Today's TOC FU Call Status: Today's TOC FU Call Status:: Successful TOC FU Call Completed TOC FU Call Complete Date: 01/07/24 Patient's Name and Date of Birth confirmed.  Transition Care Management Follow-up Telephone Call Date of Discharge: 01/04/24 Discharge Facility: [facility]) Type of Discharge: Inpatient Admission Primary Inpatient Discharge Diagnosis:: Acute Kidney Injury How have you been since you were released from the hospital?: Better Any questions or concerns?: No  Items Reviewed: Did you receive and understand the discharge instructions provided?: Yes Medications obtained,verified, and reconciled?: Yes (Medications Reviewed) Any new allergies since your discharge?: No Dietary orders reviewed?: Yes Type of Diet Ordered:: Full Liquid Diet; advance as tolerated  Medications Reviewed Today: Medications Reviewed Today     Reviewed by Lavelle Charmaine NOVAK, LPN (Licensed Practical Nurse) on 01/07/24 at 1214  Med List Status: <None>   Medication Order Taking? Sig Documenting Provider Last Dose Status Informant  Azelastine  HCl 137 MCG/SPRAY SOLN [id number] Yes PLACE 2 SPRAYS INTO BOTH NOSTRILS 2 (TWO) TIMES DAILY. USE IN EACH NOSTRIL AS DIRECTED Purcell Emil Schanz, MD  Active    Patient not taking:   Discontinued 04/06/20 0628 FLUoxetine  (PROZAC ) 20 MG capsule [id number] Yes Take 1 capsule (20 mg total) by mouth daily. Arfeen, Syed T, MD  Active   fluticasone  (FLONASE ) 50 MCG/ACT nasal spray [id number] Yes Place 1 spray into both nostrils daily as needed for allergies or rhinitis. [provider]  Active Self  folic acid  (FOLVITE ) 1 MG tablet [id number] Yes Take 1 tablet (1 mg total) by mouth daily. Elgergawy, Brayton RAMAN, MD  Active   LORazepam  (ATIVAN ) 1 MG tablet [id number] Yes Take 1 tablet (1 mg total) by mouth 3 (three) times daily as needed for anxiety. Zackowski, Scott, MD  Active   Multiple  Vitamin (MULTIVITAMINS PO) [id number] Yes Take 1 tablet by mouth daily. [provider]  Active Self  naltrexone  (DEPADE) 50 MG tablet [id number] Yes Take 1 tablet (50 mg total) by mouth daily. Arfeen, Syed T, MD  Active   pantoprazole  (PROTONIX ) 40 MG tablet [id number] Yes Take 1 tablet (40 mg total) by mouth 2 (two) times daily. Switch for any other PPI at similar dose and frequency Elgergawy, Brayton RAMAN, MD  Active   sucralfate  (CARAFATE ) 1 GM/10ML suspension [id number] Yes Take 10 mLs (1 g total) by mouth 3 (three) times daily. Elgergawy, Brayton RAMAN, MD  Active   thiamine  (VITAMIN B1) 100 MG tablet [id number] Yes Take 1 tablet (100 mg total) by mouth daily. Elgergawy, Brayton RAMAN, MD  Active   traZODone  (DESYREL ) 50 MG tablet [id number] Yes Take 1-2 tablets (50-100 mg total) by mouth at bedtime. Curry Leni DASEN, MD  Active             Home Care and Equipment/Supplies: Were Home Health Services Ordered?: NA Any new equipment or medical supplies ordered?: NA  Functional Questionnaire: Do you need assistance with bathing/showering or dressing?: No Do you need assistance with meal preparation?: No Do you need assistance with eating?: No Do you have difficulty maintaining continence: No Do you need assistance with getting out of bed/getting out of a chair/moving?: No Do you have difficulty managing or taking your medications?: No  Follow up appointments reviewed: PCP Follow-up appointment confirmed?: Yes Date of PCP follow-up appointment?: 01/10/24 Follow-up Provider: Dr. Purcell Specialist [facility] Follow-up appointment confirmed?: NA Do you need transportation to your follow-up appointment?: No Do you understand care options if  your condition(s) worsen?: Yes-patient verbalized understanding    SIGNATURE Charmaine Bloodgood, LPN [facility] Health Advisor [place] [facility] You Are. We Are. [facility] Direct Dial [phone]

## 2024-01-10 ENCOUNTER — Encounter: Payer: Self-pay | Admitting: Emergency Medicine

## 2024-01-10 ENCOUNTER — Ambulatory Visit: Payer: Self-pay | Admitting: Gastroenterology

## 2024-01-10 ENCOUNTER — Ambulatory Visit: Payer: Self-pay | Admitting: Emergency Medicine

## 2024-01-10 ENCOUNTER — Ambulatory Visit: Payer: MEDICAID | Admitting: Emergency Medicine

## 2024-01-10 VITALS — BP 114/82 | HR 81 | Temp 98.4°F | Ht 66.0 in | Wt 167.0 lb

## 2024-01-10 DIAGNOSIS — K760 Fatty (change of) liver, not elsewhere classified: Secondary | ICD-10-CM

## 2024-01-10 DIAGNOSIS — F10188 Alcohol abuse with other alcohol-induced disorder: Secondary | ICD-10-CM | POA: Diagnosis not present

## 2024-01-10 DIAGNOSIS — F322 Major depressive disorder, single episode, severe without psychotic features: Secondary | ICD-10-CM

## 2024-01-10 DIAGNOSIS — Z23 Encounter for immunization: Secondary | ICD-10-CM | POA: Diagnosis not present

## 2024-01-10 DIAGNOSIS — K2210 Ulcer of esophagus without bleeding: Secondary | ICD-10-CM

## 2024-01-10 DIAGNOSIS — F411 Generalized anxiety disorder: Secondary | ICD-10-CM

## 2024-01-10 DIAGNOSIS — Z09 Encounter for follow-up examination after completed treatment for conditions other than malignant neoplasm: Secondary | ICD-10-CM

## 2024-01-10 LAB — COMPREHENSIVE METABOLIC PANEL WITH GFR
ALT: 64 U/L — ABNORMAL HIGH (ref 0–35)
AST: 49 U/L — ABNORMAL HIGH (ref 0–37)
Albumin: 4 g/dL (ref 3.5–5.2)
Alkaline Phosphatase: 45 U/L (ref 39–117)
BUN: 5 mg/dL — ABNORMAL LOW (ref 6–23)
CO2: 28 meq/L (ref 19–32)
Calcium: 9.7 mg/dL (ref 8.4–10.5)
Chloride: 104 meq/L (ref 96–112)
Creatinine, Ser: 0.92 mg/dL (ref 0.40–1.20)
GFR: 73.87 mL/min (ref >60.00)
Glucose, Bld: 93 mg/dL (ref 70–99)
Potassium: 3.5 meq/L (ref 3.5–5.1)
Sodium: 140 meq/L (ref 135–145)
Total Bilirubin: 0.4 mg/dL (ref 0.2–1.2)
Total Protein: 6.8 g/dL (ref 6.0–8.3)

## 2024-01-10 LAB — CBC WITH DIFFERENTIAL/PLATELET
Basophils Absolute: 0 K/uL (ref 0.0–0.1)
Basophils Relative: 0.8 % (ref 0.0–3.0)
Eosinophils Absolute: 0 K/uL (ref 0.0–0.7)
Eosinophils Relative: 0.6 % (ref 0.0–5.0)
HCT: 33.4 % — ABNORMAL LOW (ref 36.0–46.0)
Hemoglobin: 10.8 g/dL — ABNORMAL LOW (ref 12.0–15.0)
Lymphocytes Relative: 24.6 % (ref 12.0–46.0)
Lymphs Abs: 1.3 K/uL (ref 0.7–4.0)
MCHC: 32.4 g/dL (ref 30.0–36.0)
MCV: 87.6 fl (ref 78.0–100.0)
Monocytes Absolute: 0.7 K/uL (ref 0.1–1.0)
Monocytes Relative: 14.3 % — ABNORMAL HIGH (ref 3.0–12.0)
Neutro Abs: 3.1 K/uL (ref 1.4–7.7)
Neutrophils Relative %: 59.7 % (ref 43.0–77.0)
Platelets: 390 K/uL (ref 150.0–400.0)
RBC: 3.81 Mil/uL — ABNORMAL LOW (ref 3.87–5.11)
RDW: 15.1 % (ref 11.5–15.5)
WBC: 5.2 K/uL (ref 4.0–10.5)

## 2024-01-10 MED ORDER — PANTOPRAZOLE SODIUM 40 MG PO TBEC: 40.0000 mg | DELAYED_RELEASE_TABLET | Freq: Two times a day (BID) | ORAL | 0 refills | Status: AC

## 2024-01-10 NOTE — Assessment & Plan Note (Signed)
 Much improved today Continues pantoprazole  40 mg twice a day Plan is to continue for 8 weeks and repeat upper endoscopy Upper endoscopy biopsies negative for cancer Patient very much aware she cannot drink alcohol Diet and nutrition discussed Advised to stay well-hydrated

## 2024-01-10 NOTE — Progress Notes (Signed)
 Danielle Reilly 48 y.o.   Chief Complaint  Patient presents with   Follow-up    Pt states that her labs were abnormal and they wanted her to have them repeated and for the provider to take over her medication for the severe acid reflux     HISTORY OF PRESENT ILLNESS: This is a 48 y.o. female here for hospital discharge follow-up Feeling much better today. Has no complaints or medical concerns today.  Hospital discharge summary as follows: Physician Discharge Summary  Katelyn Broadnax FMW:[id number] DOB: 1976/01/25 DOA: 12/30/2023   PCP: Purcell Emil Schanz, MD   Admit date: 12/30/2023 Discharge date: 01/04/2024   Admitted From: (Home) Disposition:  (Home)   Recommendations for Outpatient Follow-up:  Follow up with PCP in 1-2 weeks Please obtain BMP/CBC/LFT in one week Please follow up on the final results of her biopsy obtained during endoscopy, as well she will need repeat endoscopy to ensure resolution of her severe esophagitis       Diet recommendation: Full liquid diet, she can advance as tolerated Brief/Interim Summary:     48 y.o. female with medical history significant of alcohol abuse, GERD, anxiety. Presented with chest pain/indigestion, SOB, significant N/V, also reports she may be in alcohol withdrawal.  Last drink was on 12/29/2023. Patient reports that she has been drinking approximately fifth of hard liquor per day for the past 7 days. Denies having any withdrawal seizures.  In the ED, patient noted to be significantly tachycardic with generalized tremors, otherwise stable.     Labs showed K-->3.3, creatinine 1.49, bicarb 14, WBC 10.8, AST 78, ALT 35, T. bili 2.2, troponin 22--> 24. CTA chest with no evidence of PE, noted severe hepatic steatosis.  CT abdomen/pelvis noted for possible residual of a recently passed calculus, questionable cholelithiasis.  Right upper quadrant ultrasound showed no gallstones or wall thickening visualized, just fatty liver.  Patient  admitted for further management.    Alcohol abuse/withdrawal Patient reports that she has been drinking approximately fifth of hard liquor per day for the past 7 days.  In mild DTs, counseled to quit, was treated with CIWA protocol, no further withdrawals, discharged on thiamine  and folic acid      Odynophagia.  Severe erosive esophagitis Erosive gastropathy Erythematous duodenopathy -Ports odynophagia, status post endoscopy, significant for above, recommendation for Carafate , and Protonix  40 mg p.o. twice daily for 8 weeks, she will will need repeat endoscopy in 8 weeks to ensure healing.  Discussed with her that she will need to follow-up final results of biopsies obtained during endoscopy - Will be discharged on full liquid diet and she can advance as tolerated.     Left ear fullness.  Likely blocked eustachian tube, Claritin  and Flonase , monitor.     AKI with hypokalemia, hypophosphatemia Resolved   Severe hypokalemia Hypophosphatemia - replaced   Mild transaminitis AST elevated, T. bili elevated at 1.3, right upper quadrant ultrasound shows fatty liver, to abstain from alcohol.     Thrombocytopenia/normocytic anemia Likely 2/2 alcohol abuse Daily CBC, improving   Mild rhabdomyolysis Hydrated   Mildly elevated troponin Mild from demand ischemia caused by dehydration and hypotension, trend is flat and in non-ACS pattern, chest pain-free stable echocardiogram.   Low free T4 Free T4-->0.56, TSH 1.496 Repeat as outpatient in 4 to 8 weeks likely sick euthyroid syndrome   GERD Continue PPI       Discharge Diagnoses:  Principal Problem:   AKI (acute kidney injury) (HCC) Active Problems:   Hypokalemia  Nausea and vomiting   Tachycardia   GERD (gastroesophageal reflux disease)   Alcohol abuse with other alcohol-induced disorder (HCC)   Dehydration   Chest pain   RUQ abdominal tenderness      HPI   Prior to Admission medications   Medication Sig Start Date  End Date Taking? Authorizing Provider  Azelastine  HCl 137 MCG/SPRAY SOLN PLACE 2 SPRAYS INTO BOTH NOSTRILS 2 (TWO) TIMES DAILY. USE IN EACH NOSTRIL AS DIRECTED 12/03/23   Purcell Emil Schanz, MD  FLUoxetine  (PROZAC ) 20 MG capsule Take 1 capsule (20 mg total) by mouth daily. 11/29/23   Arfeen, Leni DASEN, MD  fluticasone  (FLONASE ) 50 MCG/ACT nasal spray Place 1 spray into both nostrils daily as needed for allergies or rhinitis.    [provider]  folic acid  (FOLVITE ) 1 MG tablet Take 1 tablet (1 mg total) by mouth daily. 01/05/24   Elgergawy, Brayton RAMAN, MD  LORazepam  (ATIVAN ) 1 MG tablet Take 1 tablet (1 mg total) by mouth 3 (three) times daily as needed for anxiety. 10/10/23   Zackowski, Scott, MD  Multiple Vitamin (MULTIVITAMINS PO) Take 1 tablet by mouth daily.    [provider]  naltrexone  (DEPADE) 50 MG tablet Take 1 tablet (50 mg total) by mouth daily. 11/29/23   Arfeen, Leni DASEN, MD  pantoprazole  (PROTONIX ) 40 MG tablet Take 1 tablet (40 mg total) by mouth 2 (two) times daily. Switch for any other PPI at similar dose and frequency 01/04/24   Elgergawy, Brayton RAMAN, MD  sucralfate  (CARAFATE ) 1 GM/10ML suspension Take 10 mLs (1 g total) by mouth 3 (three) times daily. 01/04/24 02/03/24  Elgergawy, Brayton RAMAN, MD  thiamine  (VITAMIN B1) 100 MG tablet Take 1 tablet (100 mg total) by mouth daily. 01/05/24   Elgergawy, Brayton RAMAN, MD  traZODone  (DESYREL ) 50 MG tablet Take 1-2 tablets (50-100 mg total) by mouth at bedtime. 11/29/23   Arfeen, Leni DASEN, MD  famotidine  (PEPCID ) 20 MG tablet Take 1 tablet (20 mg total) by mouth daily. Patient not taking: Reported on 10/28/2019 05/19/19 04/06/20  Dean Clarity, MD    Allergies  Allergen Reactions   Fluconazole  Hives   Hydrocodone  Nausea And Vomiting    Patient Active Problem List   Diagnosis Date Noted   RUQ abdominal tenderness 12/31/2023   AKI (acute kidney injury) 12/30/2023   Dehydration 12/30/2023   Chest pain 12/30/2023   Chronic rhinitis  08/07/2023   Alcohol intoxication in relapsed alcoholic (HCC) 01/24/2023   Alcohol abuse with other alcohol-induced disorder (HCC) 01/23/2023   Chronic post-traumatic stress disorder (PTSD) 01/23/2023   Major depressive disorder, single episode, severe without psychotic features (HCC) 01/23/2023   Generalized anxiety disorder 12/06/2020   History of gastritis 12/06/2020   History of alcohol abuse 12/06/2020   Hypokalemia 06/17/2019   Nausea and vomiting 06/17/2019   Tachycardia 06/17/2019   GERD (gastroesophageal reflux disease) 06/17/2019    Past Medical History:  Diagnosis Date   Acute pancreatitis    Allergy    SEASONAL   Anemia    Anxiety    A LITTLE   Depression    Gastritis    GERD (gastroesophageal reflux disease)    tums as needed   Hemoperitoneum 12/25/2010   Nausea and vomiting 06/17/2019   Substance abuse (HCC)    ETOH ABUSE   SVD (spontaneous vaginal delivery)    x 1   Toothache 01/29/2013    Past Surgical History:  Procedure Laterality Date   BILATERAL SALPINGECTOMY N/A 06/19/2013  Procedure: BILATERAL SALPINGECTOMY;  Surgeon: Elveria Mungo, MD;  Location: [facility];  Service: Gynecology;  Laterality: N/A;   BIOPSY OF SKIN SUBCUTANEOUS TISSUE AND/OR MUCOUS MEMBRANE  01/02/2024   Procedure: BIOPSY, SKIN, SUBCUTANEOUS TISSUE, OR MUCOUS MEMBRANE;  Surgeon: Stacia Glendia BRAVO, MD;  Location: [facility] ENDOSCOPY;  Service: Gastroenterology;;   DILITATION & CURRETTAGE/HYSTROSCOPY WITH NOVASURE ABLATION N/A 04/04/2013   Procedure: DILATATION & CURETTAGE/HYSTEROSCOPY WITH Attempted NOVASURE ABLATION ;  Surgeon: Elveria Mungo, MD;  Location: [facility];  Service: Gynecology;  Laterality: N/A;   ESOPHAGOGASTRODUODENOSCOPY N/A 01/02/2024   Procedure: EGD (ESOPHAGOGASTRODUODENOSCOPY);  Surgeon: Stacia Glendia BRAVO, MD;  Location: [facility] ENDOSCOPY;  Service: Gastroenterology;  Laterality: N/A;   HYSTEROSCOPY N/A 05/26/2013   Procedure: UNSUCCESSFUL HYSTEROSCOPY WITH  HYDROTHERMAL ABLATION;  Surgeon: Elveria Mungo, MD;  Location: [facility];  Service: Gynecology;  Laterality: N/A;   LAPAROSCOPY  12/25/2010   Procedure: LAPAROSCOPY OPERATIVE;  Surgeon: Olam DELENA Mill, MD;  Location: [facility];  Service: Gynecology;  Laterality: N/A;  Operative laparoscopy /cauterization of right hemorrhagic cyst wall. Removal of belly rings.   LAPAROSCOPY FOR ECTOPIC PREGNANCY     NOVASURE ABLATION N/A 05/26/2013   Procedure: UNSUCCESSFUL NOVASURE ABLATION;  Surgeon: Elveria Mungo, MD;  Location: [facility];  Service: Gynecology;  Laterality: N/A;   uterine biopsy     VAGINAL HYSTERECTOMY N/A 06/19/2013   Procedure:  TOTAL VAGINAL HYSTERECTOMY with bilateral salpingectomy;  Surgeon: Elveria Mungo, MD;  Location: [facility];  Service: Gynecology;  Laterality: N/A;    Social History   Socioeconomic History   Marital status: Divorced    Spouse name: Not on file   Number of children: Not on file   Years of education: Not on file   Highest education level: Some college, no degree  Occupational History   Not on file  Tobacco Use   Smoking status: Never   Smokeless tobacco: Never  Vaping Use   Vaping status: Never Used  Substance and Sexual Activity   Alcohol use: Not Currently    Comment: STOPPED 6 MONTHS AGO   Drug use: Not Currently   Sexual activity: Yes    Partners: Male    Birth control/protection: Surgical, Condom  Other Topics Concern   Not on file  Social History Narrative   Pt lives in [place] with her son.  She is unemployed.  She is not followed by a psychiatrist.   Social Drivers of Health   Financial Resource Strain: Medium Risk (08/06/2023)   Overall Financial Resource Strain (CARDIA)    Difficulty of Paying Living Expenses: Somewhat hard  Food Insecurity: No Food Insecurity (12/31/2023)   Hunger Vital Sign    Worried About Running Out of Food in the Last Year: Never true    Ran Out of Food in the Last Year: Never true   Transportation Needs: No Transportation Needs (12/31/2023)   PRAPARE - Administrator, Civil Service (Medical): No    Lack of Transportation (Non-Medical): No  Physical Activity: Insufficiently Active (08/06/2023)   Exercise Vital Sign    Days of Exercise per Week: 2 days    Minutes of Exercise per Session: 50 min  Stress: Stress Concern Present (08/06/2023)   [employer] of Occupational Health - Occupational Stress Questionnaire    Feeling of Stress : To some extent  Social Connections: Unknown (12/31/2023)   Social Connection and Isolation Panel    Frequency of Communication with Friends and Family: Never    Frequency of Social Gatherings with Friends and Family:  Once a week    Attends Religious Services: More than 4 times per year    Active Member of Clubs or Organizations: No    Attends Banker Meetings: Never    Marital Status: Not on file  Intimate Partner Violence: Not At Risk (12/31/2023)   Humiliation, Afraid, Rape, and Kick questionnaire    Fear of Current or Ex-Partner: No    Emotionally Abused: No    Physically Abused: No    Sexually Abused: No    Family History  Problem Relation Age of Onset   Drug abuse Mother    Arthritis Maternal Aunt    Cancer Maternal Aunt    Drug abuse Maternal Aunt    Arthritis Maternal Uncle    Alcohol abuse Maternal Uncle    Cancer Maternal Uncle    Drug abuse Maternal Uncle    Cancer Maternal Grandmother    Alcohol abuse Maternal Grandfather    Cancer Maternal Grandfather    Colon cancer Neg Hx    Colon polyps Neg Hx    Crohn's disease Neg Hx    Esophageal cancer Neg Hx    Rectal cancer Neg Hx    Stomach cancer Neg Hx    Ulcerative colitis Neg Hx      Review of Systems  Constitutional: Negative.  Negative for chills and fever.  HENT: Negative.  Negative for congestion and sore throat.   Respiratory: Negative.  Negative for cough and shortness of breath.   Cardiovascular: Negative.  Negative for  chest pain and palpitations.  Gastrointestinal:  Negative for abdominal pain, diarrhea, nausea and vomiting.  Genitourinary: Negative.  Negative for dysuria and hematuria.  Skin: Negative.  Negative for rash.  Neurological: Negative.  Negative for dizziness and headaches.  All other systems reviewed and are negative.   Today's Vitals   01/10/24 0945  BP: 114/82  Pulse: 81  Temp: 98.4 F (36.9 C)  TempSrc: Oral  SpO2: 99%  Weight: 167 lb (75.8 kg)  Height: 5' 6 (1.676 m)   Body mass index is 26.95 kg/m.   Physical Exam Vitals reviewed.  Constitutional:      Appearance: Normal appearance.  HENT:     Head: Normocephalic.     Mouth/Throat:     Mouth: Mucous membranes are moist.     Pharynx: Oropharynx is clear.  Eyes:     Extraocular Movements: Extraocular movements intact.     Conjunctiva/sclera: Conjunctivae normal.     Pupils: Pupils are equal, round, and reactive to light.  Cardiovascular:     Rate and Rhythm: Normal rate and regular rhythm.     Pulses: Normal pulses.     Heart sounds: Normal heart sounds.  Pulmonary:     Effort: Pulmonary effort is normal.     Breath sounds: Normal breath sounds.  Abdominal:     Palpations: Abdomen is soft.     Tenderness: There is no abdominal tenderness.  Musculoskeletal:     Cervical back: No tenderness.  Lymphadenopathy:     Cervical: No cervical adenopathy.  Skin:    General: Skin is warm and dry.     Capillary Refill: Capillary refill takes less than 2 seconds.  Neurological:     General: No focal deficit present.     Mental Status: She is alert and oriented to person, place, and time.  Psychiatric:        Mood and Affect: Mood normal.        Behavior: Behavior normal.  ASSESSMENT & PLAN: A total of 44 minutes was spent with the patient and counseling/coordination of care regarding preparing for this visit, review of most recent office visit notes, review of most recent hospital discharge summary, definite  need to stay away from alcohol, review of multiple chronic medical conditions and their management, review of all medications, review of most recent bloodwork results, review of health maintenance items, education on nutrition, prognosis, documentation, and need for follow up.   Problem List Items Addressed This Visit       Digestive   Erosive esophagitis - Primary   Much improved today Continues pantoprazole  40 mg twice a day Plan is to continue for 8 weeks and repeat upper endoscopy Upper endoscopy biopsies negative for cancer Patient very much aware she cannot drink alcohol Diet and nutrition discussed Advised to stay well-hydrated      Relevant Medications   pantoprazole  (PROTONIX ) 40 MG tablet   Other Relevant Orders   Comprehensive metabolic panel with GFR   CBC with Differential/Platelet   Hepatic steatosis   Relevant Orders   Comprehensive metabolic panel with GFR     Other   Generalized anxiety disorder   Stable.  Sees psychiatrist on a regular basis      Alcohol abuse with other alcohol-induced disorder (HCC)   Alcohol abstinence need addressed       Major depressive disorder, single episode, severe without psychotic features (HCC)   Clinically stable. No clinical depression at present time Well-controlled Continues Prozac  20 mg daily and trazodone  50 mg at bedtime Sees psychiatrist on a regular basis      Other Visit Diagnoses       Hospital discharge follow-up          Patient Instructions  Health Maintenance, Female Adopting a healthy lifestyle and getting preventive care are important in promoting health and wellness. Ask your health care provider about: The right schedule for you to have regular tests and exams. Things you can do on your own to prevent diseases and keep yourself healthy. What should I know about diet, weight, and exercise? Eat a healthy diet  Eat a diet that includes plenty of vegetables, fruits, low-fat dairy products, and  lean protein. Do not eat a lot of foods that are high in solid fats, added sugars, or sodium. Maintain a healthy weight Body mass index (BMI) is used to identify weight problems. It estimates body fat based on height and weight. Your health care provider can help determine your BMI and help you achieve or maintain a healthy weight. Get regular exercise Get regular exercise. This is one of the most important things you can do for your health. Most adults should: Exercise for at least 150 minutes each week. The exercise should increase your heart rate and make you sweat (moderate-intensity exercise). Do strengthening exercises at least twice a week. This is in addition to the moderate-intensity exercise. Spend less time sitting. Even light physical activity can be beneficial. Watch cholesterol and blood lipids Have your blood tested for lipids and cholesterol at 48 years of age, then have this test every 5 years. Have your cholesterol levels checked more often if: Your lipid or cholesterol levels are high. You are older than 48 years of age. You are at high risk for heart disease. What should I know about cancer screening? Depending on your health history and family history, you may need to have cancer screening at various ages. This may include screening for: Breast cancer. Cervical cancer. Colorectal  cancer. Skin cancer. Lung cancer. What should I know about heart disease, diabetes, and high blood pressure? Blood pressure and heart disease High blood pressure causes heart disease and increases the risk of stroke. This is more likely to develop in people who have high blood pressure readings or are overweight. Have your blood pressure checked: Every 3-5 years if you are 49-60 years of age. Every year if you are 11 years old or older. Diabetes Have regular diabetes screenings. This checks your fasting blood sugar level. Have the screening done: Once every three years after age 36 if you  are at a normal weight and have a low risk for diabetes. More often and at a younger age if you are overweight or have a high risk for diabetes. What should I know about preventing infection? Hepatitis B If you have a higher risk for hepatitis B, you should be screened for this virus. Talk with your health care provider to find out if you are at risk for hepatitis B infection. Hepatitis C Testing is recommended for: Everyone born from [birth year] through 1965. Anyone with known risk factors for hepatitis C. Sexually transmitted infections (STIs) Get screened for STIs, including gonorrhea and chlamydia, if: You are sexually active and are younger than 48 years of age. You are older than 48 years of age and your health care provider tells you that you are at risk for this type of infection. Your sexual activity has changed since you were last screened, and you are at increased risk for chlamydia or gonorrhea. Ask your health care provider if you are at risk. Ask your health care provider about whether you are at high risk for HIV. Your health care provider may recommend a prescription medicine to help prevent HIV infection. If you choose to take medicine to prevent HIV, you should first get tested for HIV. You should then be tested every 3 months for as long as you are taking the medicine. Pregnancy If you are about to stop having your period (premenopausal) and you may become pregnant, seek counseling before you get pregnant. Take 400 to 800 micrograms (mcg) of folic acid  every day if you become pregnant. Ask for birth control (contraception) if you want to prevent pregnancy. Osteoporosis and menopause Osteoporosis is a disease in which the bones lose minerals and strength with aging. This can result in bone fractures. If you are 50 years old or older, or if you are at risk for osteoporosis and fractures, ask your health care provider if you should: Be screened for bone loss. Take a calcium or vitamin  D supplement to lower your risk of fractures. Be given hormone replacement therapy (HRT) to treat symptoms of menopause. Follow these instructions at home: Alcohol use Do not drink alcohol if: Your health care provider tells you not to drink. You are pregnant, may be pregnant, or are planning to become pregnant. If you drink alcohol: Limit how much you have to: 0-1 drink a day. Know how much alcohol is in your drink. In the [place] one drink equals one 12 oz bottle of beer (355 mL), one 5 oz glass of wine (148 mL), or one 1 oz glass of hard liquor (44 mL). Lifestyle Do not use any products that contain nicotine or tobacco. These products include cigarettes, chewing tobacco, and vaping devices, such as e-cigarettes. If you need help quitting, ask your health care provider. Do not use street drugs. Do not share needles. Ask your health care provider for help if  you need support or information about quitting drugs. General instructions Schedule regular health, dental, and eye exams. Stay current with your vaccines. Tell your health care provider if: You often feel depressed. You have ever been abused or do not feel safe at home. Summary Adopting a healthy lifestyle and getting preventive care are important in promoting health and wellness. Follow your health care provider's instructions about healthy diet, exercising, and getting tested or screened for diseases. Follow your health care provider's instructions on monitoring your cholesterol and blood pressure. This information is not intended to replace advice given to you by your health care provider. Make sure you discuss any questions you have with your health care provider. Document Revised: 08/23/2020 Document Reviewed: 08/23/2020 Elsevier Patient Education  2024 Elsevier Inc.    Emil Schaumann, MD [facility]

## 2024-01-10 NOTE — Assessment & Plan Note (Signed)
 Clinically stable. No clinical depression at present time Well-controlled Continues Prozac  20 mg daily and trazodone  50 mg at bedtime Sees psychiatrist on a regular basis

## 2024-01-10 NOTE — Progress Notes (Signed)
 Danielle Reilly,  The biopsies taken from your stomach were notable for mild reactive gastropathy which is a common finding and often related to use of certain medications (usually NSAIDs), but there was no evidence of Helicobacter pylori infection.  The biopsy specimens from the esophagus did not survive processing, so no results available.  The esophagitis is most likely from severe acid reflux.  Please continue to the twice daily pantoprazole  until your repeat upper endoscopy.  You have a follow up visit scheduled in our office next month.

## 2024-01-10 NOTE — Assessment & Plan Note (Signed)
 Alcohol abstinence need addressed

## 2024-01-10 NOTE — Assessment & Plan Note (Signed)
Stable.  Sees psychiatrist on a regular basis. 

## 2024-01-10 NOTE — Patient Instructions (Signed)

## 2024-01-11 NOTE — Telephone Encounter (Signed)
 You should be if you continue drinking.  Ultrasound shows fatty liver.  You need to pay more attention to your nutrition and abstain from alcohol completely.  Fatty liver can turn into cirrhosis over time.

## 2024-01-15 ENCOUNTER — Ambulatory Visit (INDEPENDENT_AMBULATORY_CARE_PROVIDER_SITE_OTHER): Payer: MEDICAID | Admitting: Licensed Clinical Social Worker

## 2024-01-15 ENCOUNTER — Ambulatory Visit: Payer: MEDICAID | Admitting: Emergency Medicine

## 2024-01-15 DIAGNOSIS — F4312 Post-traumatic stress disorder, chronic: Secondary | ICD-10-CM

## 2024-01-15 DIAGNOSIS — F331 Major depressive disorder, recurrent, moderate: Secondary | ICD-10-CM | POA: Diagnosis not present

## 2024-01-15 DIAGNOSIS — F4310 Post-traumatic stress disorder, unspecified: Secondary | ICD-10-CM

## 2024-01-15 DIAGNOSIS — F4321 Adjustment disorder with depressed mood: Secondary | ICD-10-CM | POA: Diagnosis not present

## 2024-01-15 DIAGNOSIS — F1020 Alcohol dependence, uncomplicated: Secondary | ICD-10-CM

## 2024-01-15 NOTE — Progress Notes (Signed)
 Virtual Visit via Video Note   I connected with Danielle Reilly on 01/15/24 at 3:00pm by video enabled telemedicine application and verified that I am speaking with the correct person using two identifiers.   I discussed the limitations, risks, security and privacy concerns of performing an evaluation and management service by video and the availability of in person appointments. I also discussed with the patient that there may be a patient responsible charge related to this service. The patient expressed understanding and agreed to proceed.   I discussed the assessment and treatment plan with the patient. The patient was provided an opportunity to ask questions and all were answered. The patient agreed with the plan and demonstrated an understanding of the instructions.   The patient was advised to call back or seek an in-person evaluation if the symptoms worsen or if the condition fails to improve as anticipated.   I provided 47 minutes of non-face-to-face time during this encounter.     Danielle Ricker, LCSW, LCAS ______________________________  THERAPIST PROGRESS NOTE   Session Time:  3:00pm - 3:47pm   Location: Patient: Patient home  Provider: [facility]   Participation Level: Active    Behavioral Response: Alert, casually dressed, euthymic mood/affect   Type of Therapy:  Individual Therapy   Treatment Goals addressed: Depression/anxiety management; Medication compliance; Monitoring alcohol use  Progress Towards Goals: Progressing   Interventions: CBT, motivational interviewing, relapse prevention    Summary: Danielle Reilly is a 48 year old single AA female that presented for therapy appointment today with diagnosis of PTSD; Major depressive disorder, recurrent, moderate with anxious distress; Alcohol Use Disorder, moderate; and Unresolved Grief.  Suicidal/Homicidal: None; without intent or plan.     Therapist Response:  Clinician met with Danielle Reilly for virtual therapy session and  assessed for safety, sobriety, and medication compliance.  Danielle Reilly presented for appointment on time and was alert, oriented x5, with no evidence or self-report of active SI/HI or A/V H.  Danielle Reilly reported that she has relapsed on alcohol since our last session.  She reported compliance with medication.  Clinician inquired about Danielle Reilly's emotional ratings today, as well as any significant changes in thoughts, feelings, or behavior since previous check-in.  Danielle Reilly reported scores of 3/10 for depression, 3/10 for anxiety, and 0/10 for anger/irritability.  Danielle Reilly denied any panic attacks or outbursts.  Danielle Reilly reported that a recent struggle was relapsing on her birthday, and ending up in the hospital for 5 days to recover.  Danielle Reilly reported that this was a result of waking up on her birthday feeling sad, and missing her parents.  Danielle Reilly reported that her friends brought alcohol over to celebrate, and this triggered the binge episode.  Clinician utilized a handout with Danielle Reilly today on holiday preparedness.  This handout explained how holidays can be especially difficult for individuals struggling with substance abuse and mental health issues, and listed several typical stressors which can influence the experience negatively, including reminders of significant losses, interactions with problematic family members, feelings of loneliness, and anxiety. Helpful strategies were also offered for coping with challenges like these effectively without relying upon substances like alcohol or drugs, including setting aside adequate time for self-care, socialization with positive supports, and practice of relaxation skills.  Clinician assisted Danielle Reilly in strengthening her relapse prevention plan to cope with upcoming holidays such as Thanksgiving and Christmas without giving into urge to binge drink.  Intervention was effective, as evidenced by Danielle Reilly's active engagement in discussion on subject, reporting that there  are triggers to be mindful of  due to upcoming holidays, including grief toward her deceased parents, boredom from excess time, and being around other people that have alcohol or might encourage her drinking.  Danielle Reilly reported that since the hospitalization, she has opened up to close friends about the fact that she is an addict and needs to stay away from alcohol.  She reported that she plans to start attending in person AA meetings, approach social events with greater awareness of relapse triggers, and consider getting a sponsor for greater accountability.  She reported that she remains resistant to accepting referrals to a substance abuse treatment program such as [facility] despite encouragement from clinician.  Clinician will continue to monitor.                     Plan: Follow up in 1 week.   Diagnosis: PTSD; Major depressive disorder, recurrent, moderate with anxious distress; Alcohol Use Disorder, moderate; and Unresolved Grief.  Collaboration of Care:   None required at this time.                                                   Patient/Guardian was advised Release of Information must be obtained prior to any record release in order to collaborate their care with an outside provider. Patient/Guardian was advised if they have not already done so to contact the registration department to sign all necessary forms in order for us  to release information regarding their care.    Consent: Patient/Guardian gives verbal consent for treatment and assignment of benefits for services provided during this visit. Patient/Guardian expressed understanding and agreed to proceed.   Danielle Ricker, LCSW, LCAS 01/15/24

## 2024-01-29 ENCOUNTER — Ambulatory Visit (HOSPITAL_COMMUNITY): Payer: MEDICAID | Admitting: Licensed Clinical Social Worker

## 2024-02-05 ENCOUNTER — Encounter (HOSPITAL_COMMUNITY): Payer: Self-pay

## 2024-02-05 ENCOUNTER — Telehealth (HOSPITAL_BASED_OUTPATIENT_CLINIC_OR_DEPARTMENT_OTHER): Payer: MEDICAID | Admitting: Psychiatry

## 2024-02-05 DIAGNOSIS — Z91199 Patient's noncompliance with other medical treatment and regimen due to unspecified reason: Secondary | ICD-10-CM

## 2024-02-05 NOTE — Progress Notes (Signed)
 Patient is no-show on video platform

## 2024-02-06 NOTE — Progress Notes (Deleted)
 02/06/2024 Danielle Reilly [id number] February 17, 1976  Referring provider: Purcell Emil Schanz, * Primary GI doctor: Dr. Federico  ASSESSMENT AND PLAN:  Grade D esophagitis in setting of alcohol use 01/02/2024 EGD LA grade D  esophagitis with luminal narrowing 5 cm HH erosive gastropathy no stigmata of recent bleeding, erythematous duodenopathy.  Negative H. pylori  discharged on pantoprazole  40 mg twice daily 8 weeks then once daily indefinitely, Carafate  1 g 4 times daily  2 weeks.  Set up for repeat endoscopy 8 weeks out to evaluate for healing   Severe hepatic steatosis with elevated liver function  in setting of alcohol use 12/31/2023 RUQ US  fatty liver unremarkable gallbladder CTAP W9/14/2025 diffusely hypodense hepatic parenchyma fatty infiltration question gallstones within gallbladder lumen no gallbladder wall thickening diffusely atrophic pancreas but otherwise unremarkable normal spleen    Latest Ref Rng & Units 01/10/2024   10:14 AM 01/04/2024    6:53 AM 01/03/2024    2:28 AM  Hepatic Function  Total Protein 6.0 - 8.3 g/dL 6.8  5.9  6.0   Albumin 3.5 - 5.2 g/dL 4.0  3.1  3.2   AST 0 - 37 U/L 49  57  73   ALT 0 - 35 U/L 64  41  41   Alk Phosphatase 39 - 117 U/L 45  46  47   Total Bilirubin 0.2 - 1.2 mg/dL 0.4  0.5  0.8    Platelets 390.0  INR 12/31/2023 1.0  MDF < 32    History of colon polyps ( 2024) Up to date on surveillance colonoscopy   Alcohol Use -Alcohol Abstinence counseling discussed with patient as continued use is strongly associated with worsening liver disease progression - get on MVIT - discuss with PCP about resources and medications, discussed briefly with patient    Patient Care Team: Purcell Emil Schanz, MD as PCP - General (Internal Medicine)  HISTORY OF PRESENT ILLNESS: 48 y.o. female with a past medical history listed below presents for evaluation of ***.   *** Discussed the use of AI scribe software for clinical note transcription with  the patient, who gave verbal consent to proceed.  History of Present Illness            She  reports that she has never smoked. She has never used smokeless tobacco. She reports that she does not currently use alcohol. She reports that she does not currently use drugs.  RELEVANT GI HISTORY, IMAGING AND LABS: Results         Nov 2024 Screening colonoscopy - The examined portion of the ileum was normal. - One 5 mm polyp in the cecum, removed with a cold snare. Resected and retrieved. - Non-bleeding internal hemorrhoids. 1. Surgical [P], colon, cecum, polyp (1) :       -  SESSILE SERRATED LESION WITH ACTIVE INFLAMMATION AND REACTIVE/REPARATIVE       CHANGE, NEGATIVE FOR DYSPLASIA.      Dec 2022 EGD for dysphagia / abdominal pain and heartburn - White nummular lesions in esophageal mucosa. Biopsied. - Erythematous mucosa in the antrum. Biopsied. - Normal examined duodenum. Biopsied.   Surgical [P], duodenum TANGENTIALLY SECTIONED FRAGMENTS OF DUODENAL MUCOSA WITH APPARENTLY NORMAL VILLOUS AND CRYPT ARCHITECTURE. MILD INFLAMMATION IS SEEN BUT NO INCREASE IN INTRAEPITHELIAL LYMPHOCYTES IS NOTED. NO DYSPLASIA OR MALIGNANCY IS SEEN. 2. Surgical [P], gastric antrum and gastric body MILD CHRONIC GASTRITIS. NO SIGNIFICANT ACTIVITY, DYSPLASIA OR MALIGNANCY IS SEEN. ALCIAN BLUE STAIN WITH APPROPRIATE CONTROLS DOES NOT HIGHLIGHT  ANY INTESTINAL METAPLASIA. IMMUNOHISTOCHEMISTRY WITH APPROPRIATE CONTROLS FOR SINGLE ANTIBODY FOR H. PYLORI IS NEGATIVE FOR H. PYLORI ORGANISMS. 3. Surgical [P], esophagus FRAGMENTS OF SQUAMOUS MUCOSA WITH NO SIGNIFICANT INFLAMMATION, BASAL CELL HYPERPLASIA, DYSPLASIA OR MALIGNANCY SEEN. NO EOSINOPHILS ARE SEEN. CBC    Component Value Date/Time   WBC 5.2 01/10/2024 1014   RBC 3.81 (L) 01/10/2024 1014   HGB 10.8 (L) 01/10/2024 1014   HCT 33.4 (L) 01/10/2024 1014   PLT 390.0 01/10/2024 1014   MCV 87.6 01/10/2024 1014   MCH 28.3 01/04/2024 0653   MCHC 32.4  01/10/2024 1014   RDW 15.1 01/10/2024 1014   LYMPHSABS 1.3 01/10/2024 1014   MONOABS 0.7 01/10/2024 1014   EOSABS 0.0 01/10/2024 1014   BASOSABS 0.0 01/10/2024 1014   Recent Labs    10/10/23 1252 10/10/23 1530 12/30/23 1647 12/31/23 0415 01/01/24 0348 01/02/24 0237 01/02/24 1401 01/03/24 0228 01/04/24 0653 01/10/24 1014  HGB 12.8 13.0 12.7 10.7* 10.0* 10.0* 12.2 9.9* 10.2* 10.8*    CMP     Component Value Date/Time   NA 140 01/10/2024 1014   K 3.5 01/10/2024 1014   CL 104 01/10/2024 1014   CO2 28 01/10/2024 1014   GLUCOSE 93 01/10/2024 1014   BUN 5 (L) 01/10/2024 1014   CREATININE 0.92 01/10/2024 1014   CALCIUM 9.7 01/10/2024 1014   PROT 6.8 01/10/2024 1014   ALBUMIN 4.0 01/10/2024 1014   AST 49 (H) 01/10/2024 1014   ALT 64 (H) 01/10/2024 1014   ALKPHOS 45 01/10/2024 1014   BILITOT 0.4 01/10/2024 1014   GFRNONAA >60 01/04/2024 0653   GFRAA >60 01/08/2020 1721      Latest Ref Rng & Units 01/10/2024   10:14 AM 01/04/2024    6:53 AM 01/03/2024    2:28 AM  Hepatic Function  Total Protein 6.0 - 8.3 g/dL 6.8  5.9  6.0   Albumin 3.5 - 5.2 g/dL 4.0  3.1  3.2   AST 0 - 37 U/L 49  57  73   ALT 0 - 35 U/L 64  41  41   Alk Phosphatase 39 - 117 U/L 45  46  47   Total Bilirubin 0.2 - 1.2 mg/dL 0.4  0.5  0.8       Current Medications:     Current Outpatient Medications (Respiratory):    Azelastine  HCl 137 MCG/SPRAY SOLN, PLACE 2 SPRAYS INTO BOTH NOSTRILS 2 (TWO) TIMES DAILY. USE IN EACH NOSTRIL AS DIRECTED   fluticasone  (FLONASE ) 50 MCG/ACT nasal spray, Place 1 spray into both nostrils daily as needed for allergies or rhinitis.   Current Outpatient Medications (Hematological):    folic acid  (FOLVITE ) 1 MG tablet, Take 1 tablet (1 mg total) by mouth daily.  Current Outpatient Medications (Other):    FLUoxetine  (PROZAC ) 20 MG capsule, Take 1 capsule (20 mg total) by mouth daily.   LORazepam  (ATIVAN ) 1 MG tablet, Take 1 tablet (1 mg total) by mouth 3 (three) times  daily as needed for anxiety.   Multiple Vitamin (MULTIVITAMINS PO), Take 1 tablet by mouth daily.   naltrexone  (DEPADE) 50 MG tablet, Take 1 tablet (50 mg total) by mouth daily.   pantoprazole  (PROTONIX ) 40 MG tablet, Take 1 tablet (40 mg total) by mouth 2 (two) times daily. Switch for any other PPI at similar dose and frequency   sucralfate  (CARAFATE ) 1 GM/10ML suspension, Take 10 mLs (1 g total) by mouth 3 (three) times daily.   thiamine  (VITAMIN B1) 100 MG tablet, Take  1 tablet (100 mg total) by mouth daily.   traZODone  (DESYREL ) 50 MG tablet, Take 1-2 tablets (50-100 mg total) by mouth at bedtime.  Medical History:  Past Medical History:  Diagnosis Date   Acute pancreatitis    Allergy    SEASONAL   Anemia    Anxiety    A LITTLE   Depression    Gastritis    GERD (gastroesophageal reflux disease)    tums as needed   Hemoperitoneum 12/25/2010   Nausea and vomiting 06/17/2019   Substance abuse (HCC)    ETOH ABUSE   SVD (spontaneous vaginal delivery)    x 1   Toothache 01/29/2013   Allergies:  Allergies  Allergen Reactions   Fluconazole  Hives   Hydrocodone  Nausea And Vomiting     Surgical History:  She  has a past surgical history that includes Laparoscopy for ectopic pregnancy; laparoscopy (12/25/2010); uterine biopsy; Dilatation & currettage/hysteroscopy with novasure ablation (N/A, 04/04/2013); Novasure ablation (N/A, 05/26/2013); Hysteroscopy (N/A, 05/26/2013); Vaginal hysterectomy (N/A, 06/19/2013); Bilateral salpingectomy (N/A, 06/19/2013); Esophagogastroduodenoscopy (N/A, 01/02/2024); and Biopsy of skin subcutaneous tissue and/or mucous membrane (01/02/2024). Family History:  Her family history includes Alcohol abuse in her maternal grandfather and maternal uncle; Arthritis in her maternal aunt and maternal uncle; Cancer in her maternal aunt, maternal grandfather, maternal grandmother, and maternal uncle; Drug abuse in her maternal aunt, maternal uncle, and mother.  REVIEW OF  SYSTEMS  : All other systems reviewed and negative except where noted in the History of Present Illness.  PHYSICAL EXAM: LMP 06/19/2013  Physical Exam          Alan JONELLE Coombs, PA-C 1:07 PM

## 2024-02-07 ENCOUNTER — Ambulatory Visit: Payer: MEDICAID | Admitting: Physician Assistant

## 2024-02-09 ENCOUNTER — Emergency Department (HOSPITAL_COMMUNITY): Payer: MEDICAID

## 2024-02-09 ENCOUNTER — Other Ambulatory Visit: Payer: Self-pay

## 2024-02-09 ENCOUNTER — Inpatient Hospital Stay (HOSPITAL_COMMUNITY)
Admission: EM | Admit: 2024-02-09 | Discharge: 2024-02-13 | DRG: 897 | Disposition: A | Discharge status: Home / Self Care | Payer: MEDICAID | Attending: Internal Medicine | Admitting: Internal Medicine

## 2024-02-09 ENCOUNTER — Encounter (HOSPITAL_COMMUNITY): Payer: Self-pay | Admitting: Pharmacy Technician

## 2024-02-09 DIAGNOSIS — S025XXA Fracture of tooth (traumatic), initial encounter for closed fracture: Secondary | ICD-10-CM | POA: Diagnosis present

## 2024-02-09 DIAGNOSIS — F39 Unspecified mood [affective] disorder: Secondary | ICD-10-CM | POA: Diagnosis present

## 2024-02-09 DIAGNOSIS — K2100 Gastro-esophageal reflux disease with esophagitis, without bleeding: Secondary | ICD-10-CM | POA: Diagnosis present

## 2024-02-09 DIAGNOSIS — F10939 Alcohol use, unspecified with withdrawal, unspecified: Secondary | ICD-10-CM | POA: Diagnosis present

## 2024-02-09 DIAGNOSIS — R9431 Abnormal electrocardiogram [ECG] [EKG]: Secondary | ICD-10-CM | POA: Diagnosis present

## 2024-02-09 DIAGNOSIS — R55 Syncope and collapse: Secondary | ICD-10-CM | POA: Diagnosis present

## 2024-02-09 DIAGNOSIS — F419 Anxiety disorder, unspecified: Secondary | ICD-10-CM | POA: Diagnosis present

## 2024-02-09 DIAGNOSIS — K2920 Alcoholic gastritis without bleeding: Secondary | ICD-10-CM | POA: Diagnosis present

## 2024-02-09 DIAGNOSIS — M25512 Pain in left shoulder: Secondary | ICD-10-CM | POA: Diagnosis present

## 2024-02-09 DIAGNOSIS — Z888 Allergy status to other drugs, medicaments and biological substances status: Secondary | ICD-10-CM

## 2024-02-09 DIAGNOSIS — E860 Dehydration: Secondary | ICD-10-CM | POA: Diagnosis present

## 2024-02-09 DIAGNOSIS — K760 Fatty (change of) liver, not elsewhere classified: Secondary | ICD-10-CM | POA: Diagnosis present

## 2024-02-09 DIAGNOSIS — E869 Volume depletion, unspecified: Secondary | ICD-10-CM | POA: Diagnosis present

## 2024-02-09 DIAGNOSIS — R296 Repeated falls: Secondary | ICD-10-CM | POA: Diagnosis present

## 2024-02-09 DIAGNOSIS — M4802 Spinal stenosis, cervical region: Secondary | ICD-10-CM | POA: Diagnosis present

## 2024-02-09 DIAGNOSIS — K709 Alcoholic liver disease, unspecified: Secondary | ICD-10-CM | POA: Diagnosis present

## 2024-02-09 DIAGNOSIS — Z79899 Other long term (current) drug therapy: Secondary | ICD-10-CM

## 2024-02-09 DIAGNOSIS — Z90722 Acquired absence of ovaries, bilateral: Secondary | ICD-10-CM

## 2024-02-09 DIAGNOSIS — D649 Anemia, unspecified: Secondary | ICD-10-CM | POA: Diagnosis present

## 2024-02-09 DIAGNOSIS — M25561 Pain in right knee: Secondary | ICD-10-CM | POA: Diagnosis present

## 2024-02-09 DIAGNOSIS — E876 Hypokalemia: Secondary | ICD-10-CM | POA: Diagnosis present

## 2024-02-09 DIAGNOSIS — K2980 Duodenitis without bleeding: Secondary | ICD-10-CM | POA: Diagnosis present

## 2024-02-09 DIAGNOSIS — W19XXXA Unspecified fall, initial encounter: Secondary | ICD-10-CM | POA: Diagnosis present

## 2024-02-09 DIAGNOSIS — Z885 Allergy status to narcotic agent status: Secondary | ICD-10-CM | POA: Diagnosis not present

## 2024-02-09 DIAGNOSIS — F10230 Alcohol dependence with withdrawal, uncomplicated: Principal | ICD-10-CM | POA: Diagnosis present

## 2024-02-09 DIAGNOSIS — S01511A Laceration without foreign body of lip, initial encounter: Secondary | ICD-10-CM | POA: Diagnosis present

## 2024-02-09 DIAGNOSIS — F10930 Alcohol use, unspecified with withdrawal, uncomplicated: Principal | ICD-10-CM

## 2024-02-09 DIAGNOSIS — N951 Menopausal and female climacteric states: Secondary | ICD-10-CM | POA: Diagnosis present

## 2024-02-09 DIAGNOSIS — Z813 Family history of other psychoactive substance abuse and dependence: Secondary | ICD-10-CM

## 2024-02-09 DIAGNOSIS — Z90710 Acquired absence of both cervix and uterus: Secondary | ICD-10-CM

## 2024-02-09 DIAGNOSIS — Z9079 Acquired absence of other genital organ(s): Secondary | ICD-10-CM

## 2024-02-09 DIAGNOSIS — D696 Thrombocytopenia, unspecified: Secondary | ICD-10-CM | POA: Diagnosis present

## 2024-02-09 LAB — CBC WITH DIFFERENTIAL/PLATELET
Abs Immature Granulocytes: 0.05 K/uL (ref 0.00–0.07)
Basophils Absolute: 0.1 K/uL (ref 0.0–0.1)
Basophils Relative: 1 %
Eosinophils Absolute: 0 K/uL (ref 0.0–0.5)
Eosinophils Relative: 0 %
HCT: 43.5 % (ref 36.0–46.0)
Hemoglobin: 14.4 g/dL (ref 12.0–15.0)
Immature Granulocytes: 1 %
Lymphocytes Relative: 28 %
Lymphs Abs: 2.3 K/uL (ref 0.7–4.0)
MCH: 28 pg (ref 26.0–34.0)
MCHC: 33.1 g/dL (ref 30.0–36.0)
MCV: 84.5 fL (ref 80.0–100.0)
Monocytes Absolute: 0.6 K/uL (ref 0.1–1.0)
Monocytes Relative: 7 %
Neutro Abs: 5.4 K/uL (ref 1.7–7.7)
Neutrophils Relative %: 63 %
Platelets: 202 K/uL (ref 150–400)
RBC: 5.15 MIL/uL — ABNORMAL HIGH (ref 3.87–5.11)
RDW: 13.4 % (ref 11.5–15.5)
WBC: 8.4 K/uL (ref 4.0–10.5)
nRBC: 0 % (ref 0.0–0.2)

## 2024-02-09 LAB — URINALYSIS, ROUTINE W REFLEX MICROSCOPIC
Bacteria, UA: NONE SEEN
Bilirubin Urine: NEGATIVE
Glucose, UA: NEGATIVE mg/dL
Ketones, ur: NEGATIVE mg/dL
Leukocytes,Ua: NEGATIVE
Nitrite: NEGATIVE
Protein, ur: NEGATIVE mg/dL
Specific Gravity, Urine: >1.046 — ABNORMAL HIGH (ref 1.005–1.030)
pH: 6 (ref 5.0–8.0)

## 2024-02-09 LAB — COMPREHENSIVE METABOLIC PANEL WITH GFR
ALT: 68 U/L — ABNORMAL HIGH (ref 0–44)
AST: 128 U/L — ABNORMAL HIGH (ref 15–41)
Albumin: 4.4 g/dL (ref 3.5–5.0)
Alkaline Phosphatase: 89 U/L (ref 38–126)
Anion gap: 17 — ABNORMAL HIGH (ref 5–15)
BUN: 8 mg/dL (ref 6–20)
CO2: 24 mmol/L (ref 22–32)
Calcium: 10.1 mg/dL (ref 8.9–10.3)
Chloride: 91 mmol/L — ABNORMAL LOW (ref 98–111)
Creatinine, Ser: 1.11 mg/dL — ABNORMAL HIGH (ref 0.44–1.00)
GFR, Estimated: >60 mL/min (ref >60)
Glucose, Bld: 110 mg/dL — ABNORMAL HIGH (ref 70–99)
Potassium: 3.3 mmol/L — ABNORMAL LOW (ref 3.5–5.1)
Sodium: 132 mmol/L — ABNORMAL LOW (ref 135–145)
Total Bilirubin: 2 mg/dL — ABNORMAL HIGH (ref 0.0–1.2)
Total Protein: 8.3 g/dL — ABNORMAL HIGH (ref 6.5–8.1)

## 2024-02-09 LAB — LIPASE, BLOOD: Lipase: 38 U/L (ref 11–51)

## 2024-02-09 LAB — PREGNANCY, URINE: Preg Test, Ur: NEGATIVE

## 2024-02-09 LAB — ETHANOL: Alcohol, Ethyl (B): <15 mg/dL (ref <15)

## 2024-02-09 MED ORDER — LACTATED RINGERS IV SOLN: INTRAVENOUS | Status: AC

## 2024-02-09 MED ORDER — THIAMINE HCL 100 MG/ML IJ SOLN
100.0000 mg | Freq: Every day | INTRAMUSCULAR | Status: DC
  Filled 2024-02-09: qty 2

## 2024-02-09 MED ORDER — LIDOCAINE VISCOUS HCL 2 % MT SOLN
15.0000 mL | Freq: Once | OROMUCOSAL | Status: AC
  Administered 2024-02-09: 15 mL via ORAL
  Filled 2024-02-09: qty 15

## 2024-02-09 MED ORDER — IOHEXOL 350 MG/ML SOLN
75.0000 mL | Freq: Once | INTRAVENOUS | Status: AC | PRN
  Administered 2024-02-09: 75 mL via INTRAVENOUS

## 2024-02-09 MED ORDER — FOLIC ACID 1 MG PO TABS
1.0000 mg | ORAL_TABLET | Freq: Every day | ORAL | Status: DC
  Administered 2024-02-09 – 2024-02-13 (×5): 1 mg via ORAL
  Filled 2024-02-09 (×5): qty 1

## 2024-02-09 MED ORDER — ALUM & MAG HYDROXIDE-SIMETH 200-200-20 MG/5ML PO SUSP
30.0000 mL | Freq: Once | ORAL | Status: AC
  Administered 2024-02-09: 30 mL via ORAL
  Filled 2024-02-09: qty 30

## 2024-02-09 MED ORDER — LORAZEPAM 1 MG PO TABS
1.0000 mg | ORAL_TABLET | ORAL | Status: DC | PRN
  Administered 2024-02-09: 2 mg via ORAL
  Filled 2024-02-09: qty 2

## 2024-02-09 MED ORDER — LORAZEPAM 2 MG/ML IJ SOLN
1.0000 mg | INTRAMUSCULAR | Status: DC | PRN
  Administered 2024-02-09: 2 mg via INTRAVENOUS
  Filled 2024-02-09: qty 1

## 2024-02-09 MED ORDER — SODIUM CHLORIDE 0.9 % IV BOLUS
1000.0000 mL | Freq: Once | INTRAVENOUS | Status: AC
  Administered 2024-02-09: 1000 mL via INTRAVENOUS

## 2024-02-09 MED ORDER — LORAZEPAM 1 MG PO TABS
1.0000 mg | ORAL_TABLET | ORAL | Status: AC | PRN
  Administered 2024-02-10: 2 mg via ORAL
  Administered 2024-02-10 (×2): 3 mg via ORAL
  Administered 2024-02-10: 2 mg via ORAL
  Administered 2024-02-11 – 2024-02-12 (×4): 1 mg via ORAL
  Filled 2024-02-09: qty 1
  Filled 2024-02-09: qty 2
  Filled 2024-02-09 (×2): qty 3
  Filled 2024-02-09 (×2): qty 1
  Filled 2024-02-09: qty 2
  Filled 2024-02-09: qty 1

## 2024-02-09 MED ORDER — ONDANSETRON HCL 4 MG/2ML IJ SOLN
4.0000 mg | Freq: Once | INTRAMUSCULAR | Status: AC
  Administered 2024-02-09: 4 mg via INTRAVENOUS
  Filled 2024-02-09: qty 2

## 2024-02-09 MED ORDER — ADULT MULTIVITAMIN W/MINERALS CH
1.0000 | ORAL_TABLET | Freq: Every day | ORAL | Status: DC
  Administered 2024-02-09 – 2024-02-13 (×5): 1 via ORAL
  Filled 2024-02-09 (×5): qty 1

## 2024-02-09 MED ORDER — THIAMINE MONONITRATE 100 MG PO TABS
100.0000 mg | ORAL_TABLET | Freq: Every day | ORAL | Status: DC
  Administered 2024-02-09 – 2024-02-13 (×5): 100 mg via ORAL
  Filled 2024-02-09 (×5): qty 1

## 2024-02-09 MED ORDER — LORAZEPAM 2 MG/ML IJ SOLN
1.0000 mg | INTRAMUSCULAR | Status: AC | PRN
  Administered 2024-02-09: 2 mg via INTRAVENOUS
  Filled 2024-02-09: qty 1

## 2024-02-09 MED ORDER — POTASSIUM CHLORIDE CRYS ER 20 MEQ PO TBCR
40.0000 meq | EXTENDED_RELEASE_TABLET | Freq: Once | ORAL | Status: AC
  Administered 2024-02-09: 40 meq via ORAL
  Filled 2024-02-09: qty 2

## 2024-02-09 NOTE — ED Triage Notes (Signed)
 Pt here with reports of withdrawing from ETOH also having abdominal pain. Reports blacking out twice. Pt reports drinking a pint a day during her binges. Last drink yesterday.

## 2024-02-09 NOTE — ED Provider Triage Note (Signed)
 Emergency Medicine Provider Triage Evaluation Note  Danielle Reilly , a 48 y.o. female  was evaluated in triage.  Pt complains of alcohol withdrawal, last drink yesterday. Patient states that abdominal pain started with alcohol withdrawal. Patient complaining of hot flashes, cannot sit still, anxiety. Never had seizures from withdrawal. Patient walking 3 days ago and 2 days ago and fell out because she blacked out. First time woke up with busted lip and broken tooth, second time fell backwards onto back of head. No fever, but does appreciate chills. Denies chest pain or shortness of breath. Some nausea, no vomiting. Reduced PO intake. Patient complaining of brain fog and pressure.   Review of Systems  Positive: Abdominal pain, syncope, alcohol withdrawal, chills, anxious Negative: Fever, chest pain, shortness of breath, visual disturbances  Physical Exam  BP 128/88   Pulse (!) 140   Temp 98.8 F (37.1 C)   Resp 18   LMP 06/19/2013   SpO2 100%  Gen:   Awake, no distress   Resp:  Normal effort  MSK:   Moves extremities without difficulty  Other:  Abdomen soft, generalized abdominal tenderness with palpation, tenderness of cervical spine and posterior skull, no raccoon eyes, no Battle sign, patient able to look left, right, touching the chest, and look up at the ceiling with significant discomfort, tongue fasciculations present as well as tremors of bilateral upper extremities, tachycardia noted  Medical Decision Making  Medically screening exam initiated at 2:45 PM.  Appropriate orders placed.  Danielle Reilly was informed that the remainder of the evaluation will be completed by another provider, this initial triage assessment does not replace that evaluation, and the importance of remaining in the ED until their evaluation is complete.  Orders: CBC, CMP, point-of-care CBG, UA, rapid UDS, ethanol, CT head, CT cervical spine, CT abdomen pelvis with contrast, EKG   Janetta Terrall FALCON,  PA-C 02/09/24 1459

## 2024-02-09 NOTE — ED Provider Notes (Signed)
 [place] EMERGENCY DEPARTMENT AT [facility] Provider Note   CSN: [id number] Arrival date & time: 02/09/24  1346     Patient presents with: Withdrawal   Danielle Reilly is a 48 y.o. female with past medical history of GERD, GAD, gastritis, alcohol abuse, MDD presents emergency department for evaluation of concern for alcohol withdrawal.  Reports that she has been drinking 1/5 of tequila a day for 4 days in a row this week.  She reports that she typically binge drinks for 1 week every month.  While drinking this week she reports 2 episodes of blacking out causing her to fall.  Her first fall occurred where she fell forward and chipped her right tooth and endured a laceration to her lip.  Her second fall she was attempting to walk at the bathroom when she suddenly blacked out and fell backwards.  Unknown how long blackout episodes lasted.  Unwitnessed.  Last drink was yesterday.  Was initially evaluated in triage.  CIWA 14 and provided Ativan  1 mg  Currently complaining of anxiety, skin paresthesia, headache, nausea, epigastric pain.  Last BM was today and loose.  No vomiting.  Headache is mainly in the back of her head with pressure on the front of her head.  No visual disturbances nor auditory nor visual hallucinations.   {Add pertinent medical, surgical, social history, OB history to HPI:32947} HPI     Prior to Admission medications   Medication Sig Start Date End Date Taking? Authorizing Provider  Azelastine  HCl 137 MCG/SPRAY SOLN PLACE 2 SPRAYS INTO BOTH NOSTRILS 2 (TWO) TIMES DAILY. USE IN EACH NOSTRIL AS DIRECTED 12/03/23   Purcell Emil Schanz, MD  FLUoxetine  (PROZAC ) 20 MG capsule Take 1 capsule (20 mg total) by mouth daily. 11/29/23   Arfeen, Leni DASEN, MD  fluticasone  (FLONASE ) 50 MCG/ACT nasal spray Place 1 spray into both nostrils daily as needed for allergies or rhinitis.    [provider]  folic acid  (FOLVITE ) 1 MG tablet Take 1 tablet (1 mg total) by  mouth daily. 01/05/24   Elgergawy, Brayton RAMAN, MD  LORazepam  (ATIVAN ) 1 MG tablet Take 1 tablet (1 mg total) by mouth 3 (three) times daily as needed for anxiety. 10/10/23   Zackowski, Scott, MD  Multiple Vitamin (MULTIVITAMINS PO) Take 1 tablet by mouth daily.    [provider]  naltrexone  (DEPADE) 50 MG tablet Take 1 tablet (50 mg total) by mouth daily. 11/29/23   Arfeen, Leni DASEN, MD  pantoprazole  (PROTONIX ) 40 MG tablet Take 1 tablet (40 mg total) by mouth 2 (two) times daily. Switch for any other PPI at similar dose and frequency 01/10/24   [name], Emil Schanz, MD  sucralfate  (CARAFATE ) 1 GM/10ML suspension Take 10 mLs (1 g total) by mouth 3 (three) times daily. 01/04/24 02/03/24  Elgergawy, Brayton RAMAN, MD  thiamine  (VITAMIN B1) 100 MG tablet Take 1 tablet (100 mg total) by mouth daily. 01/05/24   Elgergawy, Brayton RAMAN, MD  traZODone  (DESYREL ) 50 MG tablet Take 1-2 tablets (50-100 mg total) by mouth at bedtime. 11/29/23   Arfeen, Leni DASEN, MD  famotidine  (PEPCID ) 20 MG tablet Take 1 tablet (20 mg total) by mouth daily. Patient not taking: Reported on 10/28/2019 05/19/19 04/06/20  Dean Clarity, MD    Allergies: Fluconazole  and Hydrocodone     Review of Systems  Psychiatric/Behavioral:  The patient is nervous/anxious.     Updated Vital Signs BP 106/66   Pulse (!) 114   Temp 98.6 F (37 C) (  Oral)   Resp 20   Ht 5' 6 (1.676 m)   Wt 75.8 kg   LMP 06/19/2013   SpO2 98%   BMI 26.97 kg/m   Physical Exam Vitals and nursing note reviewed.  Constitutional:      General: She is not in acute distress.    Appearance: Normal appearance.     Comments: Tremulous  HENT:     Head: Normocephalic and atraumatic.  Eyes:     General: Lids are normal. Vision grossly intact. No visual field deficit.    Extraocular Movements:     Right eye: Normal extraocular motion and no nystagmus.     Left eye: Normal extraocular motion and no nystagmus.     Conjunctiva/sclera: Conjunctivae normal.   Cardiovascular:     Rate and Rhythm: Normal rate.  Pulmonary:     Effort: Pulmonary effort is normal. No respiratory distress.     Breath sounds: Normal breath sounds.  Abdominal:     General: Bowel sounds are normal. There is no distension.     Palpations: Abdomen is soft.     Tenderness: There is abdominal tenderness in the epigastric area. There is no guarding or rebound.     Comments: Nonsurgical abdomen with no peritoneal signs  Musculoskeletal:     Cervical back: Normal range of motion and neck supple. No rigidity.  Skin:    Coloration: Skin is not jaundiced or pale.  Neurological:     Mental Status: She is alert and oriented to person, place, and time. Mental status is at baseline.     GCS: GCS eye subscore is 4. GCS verbal subscore is 5. GCS motor subscore is 6.     Cranial Nerves: No cranial nerve deficit, dysarthria or facial asymmetry.     Sensory: Sensation is intact. No sensory deficit.     Motor: No weakness, tremor, atrophy, abnormal muscle tone, seizure activity or pronator drift.     Coordination: Coordination normal. Finger-Nose-Finger Test and Heel to [place] Test normal.     Gait: Gait is intact.  Psychiatric:        Attention and Perception: Attention normal. She does not perceive auditory or visual hallucinations.        Mood and Affect: Mood normal.        Speech: Speech normal.        Behavior: Behavior normal. Behavior is cooperative.        Thought Content: Thought content normal.     Comments: Does not appear to be responding to internal stimuli     (all labs ordered are listed, but only abnormal results are displayed) Labs Reviewed  CBC WITH DIFFERENTIAL/PLATELET - Abnormal; Notable for the following components:      Result Value   RBC 5.15 (*)    All other components within normal limits  COMPREHENSIVE METABOLIC PANEL WITH GFR - Abnormal; Notable for the following components:   Sodium 132 (*)    Potassium 3.3 (*)    Chloride 91 (*)    Glucose, Bld  110 (*)    Creatinine, Ser 1.11 (*)    Total Protein 8.3 (*)    AST 128 (*)    ALT 68 (*)    Total Bilirubin 2.0 (*)    Anion gap 17 (*)    All other components within normal limits  ETHANOL  LIPASE, BLOOD  URINALYSIS, ROUTINE W REFLEX MICROSCOPIC  RAPID URINE DRUG SCREEN, HOSP PERFORMED  PREGNANCY, URINE  CBG MONITORING, ED  EKG: None  Radiology: CT ABDOMEN PELVIS W CONTRAST Result Date: 02/09/2024 CLINICAL DATA:  Abdominal pain.  Alcohol withdrawal. EXAM: CT ABDOMEN AND PELVIS WITH CONTRAST TECHNIQUE: Multidetector CT imaging of the abdomen and pelvis was performed using the standard protocol following bolus administration of intravenous contrast. RADIATION DOSE REDUCTION: This exam was performed according to the departmental dose-optimization program which includes automated exposure control, adjustment of the mA and/or kV according to patient size and/or use of iterative reconstruction technique. CONTRAST:  75mL OMNIPAQUE  IOHEXOL  350 MG/ML SOLN COMPARISON:  CT scan 12/30/2023 FINDINGS: Lower chest: The lung bases are clear of acute process. No pleural effusion or pulmonary lesions. The heart is normal in size. No pericardial effusion. The distal esophagus and aorta are unremarkable. Hepatobiliary: Diffuse fatty infiltration of the liver but no worrisome hepatic lesions or intrahepatic biliary dilatation. The gallbladder is unremarkable. No common bile duct dilatation. The portal and hepatic veins are patent. Pancreas: Unremarkable. No pancreatic ductal dilatation or surrounding inflammatory changes. Spleen: Normal in size without focal abnormality. Adrenals/Urinary Tract: Adrenal glands and kidneys are unremarkable. No renal lesions, renal calculi or hydronephrosis. No findings for pyelonephritis. The bladder is unremarkable. Stomach/Bowel: The stomach, duodenum, small bowel and colon are grossly normal without oral contrast. No inflammatory changes, mass lesions or obstructive findings.  The appendix is normal. Vascular/Lymphatic: The aorta is normal in caliber. No dissection. The branch vessels are patent. The major venous structures are patent. Retroaortic left renal vein noted. No mesenteric or retroperitoneal mass or adenopathy. Small scattered lymph nodes are noted. Reproductive: The uterus is surgically absent. Both ovaries are still present and appear normal. Other: No pelvic mass or adenopathy. No free pelvic fluid collections. No inguinal mass or adenopathy. No abdominal wall hernia or subcutaneous lesions. Musculoskeletal: No significant bony findings. IMPRESSION: 1. No acute abdominal/pelvic findings, mass lesions or adenopathy. 2. Diffuse fatty infiltration of the liver. Electronically Signed   By: MYRTIS Stammer M.D.   On: 02/09/2024 17:29   CT Cervical Spine Wo Contrast Result Date: 02/09/2024 EXAM: CT CERVICAL SPINE WITHOUT CONTRAST 02/09/2024 04:53:15 PM TECHNIQUE: CT of the cervical spine was performed without the administration of intravenous contrast. Multiplanar reformatted images are provided for review. Automated exposure control, iterative reconstruction, and/or weight based adjustment of the mA/kV was utilized to reduce the radiation dose to as low as reasonably achievable. COMPARISON: None available. CLINICAL HISTORY: Neck pain, fall. FINDINGS: CERVICAL SPINE: BONES AND ALIGNMENT: No acute fracture or traumatic malalignment. DEGENERATIVE CHANGES: Endplate degenerative change and uncovertebral spurring is greatest at C3-C4 and C5-C6. Moderate left foraminal stenosis is present at C5-C6. SOFT TISSUES: No prevertebral soft tissue swelling. IMPRESSION: 1. No acute abnormality of the cervical spine related to the fall. 2. Moderate left neural foraminal stenosis at C5-6. Electronically signed by: Lonni Necessary MD 02/09/2024 05:11 PM EDT RP Workstation: [id number]   CT Head Wo Contrast Result Date: 02/09/2024 EXAM: CT HEAD WITHOUT CONTRAST 02/09/2024 04:53:15 PM  TECHNIQUE: CT of the head was performed without the administration of intravenous contrast. Automated exposure control, iterative reconstruction, and/or weight based adjustment of the mA/kV was utilized to reduce the radiation dose to as low as reasonably achievable. COMPARISON: None available. CLINICAL HISTORY: Fall, head injury, brain fog. FINDINGS: BRAIN AND VENTRICLES: No acute hemorrhage. No evidence of acute infarct. No hydrocephalus. No extra-axial collection. No mass effect or midline shift. ORBITS: No acute abnormality. SINUSES: No acute abnormality. SOFT TISSUES AND SKULL: No acute soft tissue abnormality. No skull fracture. IMPRESSION: 1. No acute intracranial  abnormality. Electronically signed by: Lonni Necessary MD 02/09/2024 05:10 PM EDT RP Workstation: [id number]    {Document cardiac monitor, telemetry assessment procedure when appropriate:32947} .Ultrasound ED Peripheral IV (Provider)  Date/Time: 02/09/2024 4:23 PM  Performed by: Minnie Tinnie BRAVO, PA Authorized by: Minnie Tinnie BRAVO, PA   Procedure details:    Indications: multiple failed IV attempts and poor IV access     Location:  Right AC   Angiocath:  20 G   Bedside Ultrasound Guided: Yes     Images: archived     Patient tolerated procedure without complications: Yes     Dressing applied: Yes   Comments:     Easily compressible vessel on ultrasound. Catheter easily flushes with blood return    Medications Ordered in the ED  thiamine  (VITAMIN B1) tablet 100 mg (100 mg Oral Given 02/09/24 1508)    Or  thiamine  (VITAMIN B1) injection 100 mg ( Intravenous See Alternative 02/09/24 1508)  folic acid  (FOLVITE ) tablet 1 mg (1 mg Oral Given 02/09/24 1508)  multivitamin with minerals tablet 1 tablet (1 tablet Oral Given 02/09/24 1507)  LORazepam  (ATIVAN ) tablet 1-4 mg (has no administration in time range)    Or  LORazepam  (ATIVAN ) injection 1-4 mg (has no administration in time range)  lactated ringers  infusion (has no  administration in time range)  sodium chloride  0.9 % bolus 1,000 mL (0 mLs Intravenous Stopped 02/09/24 1730)  ondansetron  (ZOFRAN ) injection 4 mg (4 mg Intravenous Given 02/09/24 1630)  alum & mag hydroxide-simeth (MAALOX/MYLANTA) 200-200-20 MG/5ML suspension 30 mL (30 mLs Oral Given 02/09/24 1633)    And  lidocaine  (XYLOCAINE ) 2 % viscous mouth solution 15 mL (15 mLs Oral Given 02/09/24 1633)  iohexol  (OMNIPAQUE ) 350 MG/ML injection 75 mL (75 mLs Intravenous Contrast Given 02/09/24 1704)  sodium chloride  0.9 % bolus 1,000 mL (1,000 mLs Intravenous New Bag/Given 02/09/24 1825)    Clinical Course as of 02/09/24 2211  Sat Feb 09, 2024  1845 Anion gap(!): 17 Likely 2/2 ETOH. Provided 2 IVF [LB]    Clinical Course User Index [LB] Minnie Tinnie BRAVO, PA   {Click here for ABCD2, HEART and other calculators REFRESH Note before signing:1}                              Medical Decision Making Amount and/or Complexity of Data Reviewed Labs: ordered. Decision-making details documented in ED Course.  Risk OTC drugs. Prescription drug management. Decision regarding hospitalization.   Patient presents to the ED for concern of anxiety, HA, syncope, this involves an extensive number of treatment options, and is a complaint that carries with it a high risk of complications and morbidity.  The differential diagnosis includes withdrawal, DT, intoxication, electrolyte abnormality    Co morbidities that complicate the patient evaluation  Alcohol abuse   Additional history obtained:  Additional history obtained from Nursing   External records from outside source obtained and reviewed including triage RN note   Lab Tests:  I Ordered, and personally interpreted labs.  The pertinent results include:   Sodium 132 Potassium 3.3 CBG 110 Creatinine 1.11 AST 128 ALT 68 total bili 2 AG 17 Ethanol WNL   Imaging Studies ordered:  I ordered imaging studies including the abdomen pelvis I  independently visualized and interpreted imaging which showed no acute intra-abdominal pathology I agree with the radiologist interpretation   Cardiac Monitoring:  The patient was maintained on a cardiac monitor.  I personally viewed  and interpreted the cardiac monitored which showed an underlying rhythm of: Sinus tachycardia with no ST no T wave abnormalities   Medicines ordered and prescription drug management:  I ordered medication including ativan , thiamine , maalox, zofran , folic acid  NS  for etoh withdrawal, epigastric tenderness Reevaluation of the patient after these medicines showed that the patient improved I have reviewed the patients home medicines and have made adjustments as needed   Consults obtained:   Discussed ED workup, labs with hospitalist Dr. Keturah who accepted patient for admission   Problem List / ED Course:  ETOH withdrawal Ethanol negative Complaining of HA, anxiety, nausea CIWA originally 14 in triage. Very tremulous. Provided 1 mg Ativan  p.o. Since then, patient continues to be tachycardic at 130.  Complains of nausea, headache, tactile disturbances.  No hallucinations Repeat CIWA 11 following Ativan  1 mg an hour later and provided Ativan  2 mg IV Provided to IVF x2 for tachycardia with improvement from 140 bpm down to 117 bpm.  Low suspicion for ACS, PE.  Absolutely no complaints of chest pain, shortness of breath.  Likely secondary to dehydration, withdrawal Current CIWA 4 but remains tachycardic. Will admit for etoh withdrawal  Fall subsequent encounter Fall twice while intoxicated 2 days ago CT head without ICH.  CT cervical spine without fracture, traumatic subluxation to cervical spine No other injuries reported by patient or found on physical exam other than possible head injury Neurologically intact with no motor nor sensory deficits.  No complaints of dizziness at rest.  Epigastric pain No traumatic injury to abdomen nor other acute  abnormalities for epigastric pain No ecchymosis to abdomen Likely having some gastritis, reflux as she has had this in the past Provided Maalox with improvement   Reevaluation:  After the interventions noted above, I reevaluated the patient and found that they have :improved    Dispostion:  After consideration of the diagnostic results and the patients response to treatment, I feel that the patent would benefit from admission for ETOH withdrawal.   Discussed ED workup, disposition with patient expressed understanding with the plan.  Questions answered to her satisfaction.  Final diagnoses:  Alcohol withdrawal syndrome without complication [facility])    ED Discharge Orders     None

## 2024-02-09 NOTE — Care Management (Signed)
SA resources on AVS 

## 2024-02-10 ENCOUNTER — Inpatient Hospital Stay (HOSPITAL_COMMUNITY): Payer: MEDICAID

## 2024-02-10 DIAGNOSIS — F10939 Alcohol use, unspecified with withdrawal, unspecified: Secondary | ICD-10-CM | POA: Diagnosis not present

## 2024-02-10 DIAGNOSIS — E860 Dehydration: Secondary | ICD-10-CM

## 2024-02-10 DIAGNOSIS — R55 Syncope and collapse: Secondary | ICD-10-CM | POA: Diagnosis not present

## 2024-02-10 LAB — BASIC METABOLIC PANEL WITH GFR
Anion gap: 15 (ref 5–15)
BUN: 9 mg/dL (ref 6–20)
CO2: 23 mmol/L (ref 22–32)
Calcium: 8.3 mg/dL — ABNORMAL LOW (ref 8.9–10.3)
Chloride: 100 mmol/L (ref 98–111)
Creatinine, Ser: 0.88 mg/dL (ref 0.44–1.00)
GFR, Estimated: >60 mL/min (ref >60)
Glucose, Bld: 103 mg/dL — ABNORMAL HIGH (ref 70–99)
Potassium: 3.8 mmol/L (ref 3.5–5.1)
Sodium: 138 mmol/L (ref 135–145)

## 2024-02-10 LAB — CBC
HCT: 31.6 % — ABNORMAL LOW (ref 36.0–46.0)
Hemoglobin: 10.4 g/dL — ABNORMAL LOW (ref 12.0–15.0)
MCH: 28.3 pg (ref 26.0–34.0)
MCHC: 32.9 g/dL (ref 30.0–36.0)
MCV: 85.9 fL (ref 80.0–100.0)
Platelets: 140 K/uL — ABNORMAL LOW (ref 150–400)
RBC: 3.68 MIL/uL — ABNORMAL LOW (ref 3.87–5.11)
RDW: 13.8 % (ref 11.5–15.5)
WBC: 5.9 K/uL (ref 4.0–10.5)
nRBC: 0 % (ref 0.0–0.2)

## 2024-02-10 LAB — RAPID URINE DRUG SCREEN, HOSP PERFORMED
Amphetamines: NOT DETECTED
Barbiturates: NOT DETECTED
Benzodiazepines: POSITIVE — AB
Cocaine: NOT DETECTED
Opiates: NOT DETECTED
Tetrahydrocannabinol: POSITIVE — AB

## 2024-02-10 LAB — PHOSPHORUS
Phosphorus: 2.7 mg/dL (ref 2.5–4.6)
Phosphorus: 3.2 mg/dL (ref 2.5–4.6)

## 2024-02-10 LAB — MAGNESIUM
Magnesium: 1.5 mg/dL — ABNORMAL LOW (ref 1.7–2.4)
Magnesium: 1.5 mg/dL — ABNORMAL LOW (ref 1.7–2.4)

## 2024-02-10 LAB — TSH: TSH: 3.196 u[IU]/mL (ref 0.350–4.500)

## 2024-02-10 MED ORDER — FLUOXETINE HCL 20 MG PO CAPS
20.0000 mg | ORAL_CAPSULE | Freq: Every day | ORAL | Status: DC
  Administered 2024-02-10 – 2024-02-13 (×4): 20 mg via ORAL
  Filled 2024-02-10 (×4): qty 1

## 2024-02-10 MED ORDER — CHLORDIAZEPOXIDE HCL 25 MG PO CAPS: 25.0000 mg | ORAL_CAPSULE | Freq: Once | ORAL | Status: DC

## 2024-02-10 MED ORDER — CHLORDIAZEPOXIDE HCL 25 MG PO CAPS: 25.0000 mg | ORAL_CAPSULE | Freq: Three times a day (TID) | ORAL | Status: DC

## 2024-02-10 MED ORDER — CARMEX CLASSIC LIP BALM EX OINT
TOPICAL_OINTMENT | CUTANEOUS | Status: DC | PRN
  Administered 2024-02-10: 1 via TOPICAL
  Filled 2024-02-10: qty 10

## 2024-02-10 MED ORDER — POLYETHYLENE GLYCOL 3350 17 G PO PACK: 17.0000 g | PACK | Freq: Every day | ORAL | Status: DC | PRN

## 2024-02-10 MED ORDER — TRAMADOL HCL 50 MG PO TABS
100.0000 mg | ORAL_TABLET | Freq: Four times a day (QID) | ORAL | Status: DC | PRN
  Administered 2024-02-10 – 2024-02-12 (×2): 100 mg via ORAL
  Filled 2024-02-10 (×2): qty 2

## 2024-02-10 MED ORDER — IOHEXOL 350 MG/ML SOLN
75.0000 mL | Freq: Once | INTRAVENOUS | Status: AC | PRN
  Administered 2024-02-10: 75 mL via INTRAVENOUS

## 2024-02-10 MED ORDER — CHLORDIAZEPOXIDE HCL 25 MG PO CAPS
25.0000 mg | ORAL_CAPSULE | Freq: Two times a day (BID) | ORAL | Status: AC
  Administered 2024-02-11 (×2): 25 mg via ORAL
  Filled 2024-02-10 (×2): qty 1

## 2024-02-10 MED ORDER — PANTOPRAZOLE SODIUM 40 MG PO TBEC
40.0000 mg | DELAYED_RELEASE_TABLET | Freq: Two times a day (BID) | ORAL | Status: DC
  Administered 2024-02-10 – 2024-02-13 (×8): 40 mg via ORAL
  Filled 2024-02-10 (×8): qty 1

## 2024-02-10 MED ORDER — CHLORDIAZEPOXIDE HCL 25 MG PO CAPS: 25.0000 mg | ORAL_CAPSULE | Freq: Two times a day (BID) | ORAL | Status: DC

## 2024-02-10 MED ORDER — MELATONIN 3 MG PO TABS: 6.0000 mg | ORAL_TABLET | Freq: Every evening | ORAL | Status: DC | PRN

## 2024-02-10 MED ORDER — TRIMETHOBENZAMIDE HCL 100 MG/ML IM SOLN
200.0000 mg | Freq: Three times a day (TID) | INTRAMUSCULAR | Status: DC | PRN
  Administered 2024-02-10: 200 mg via INTRAMUSCULAR
  Filled 2024-02-10: qty 2

## 2024-02-10 MED ORDER — ACETAMINOPHEN 500 MG PO TABS
1000.0000 mg | ORAL_TABLET | Freq: Four times a day (QID) | ORAL | Status: DC | PRN
  Administered 2024-02-10 (×2): 1000 mg via ORAL
  Filled 2024-02-10 (×2): qty 2

## 2024-02-10 MED ORDER — MAGNESIUM SULFATE 4 GM/100ML IV SOLN
4.0000 g | Freq: Once | INTRAVENOUS | Status: AC
  Administered 2024-02-10: 4 g via INTRAVENOUS
  Filled 2024-02-10: qty 100

## 2024-02-10 MED ORDER — CHLORDIAZEPOXIDE HCL 25 MG PO CAPS
25.0000 mg | ORAL_CAPSULE | Freq: Once | ORAL | Status: AC
  Administered 2024-02-12: 25 mg via ORAL
  Filled 2024-02-10: qty 1

## 2024-02-10 MED ORDER — CHLORDIAZEPOXIDE HCL 25 MG PO CAPS
25.0000 mg | ORAL_CAPSULE | Freq: Three times a day (TID) | ORAL | Status: AC
  Administered 2024-02-10 (×3): 25 mg via ORAL
  Filled 2024-02-10 (×3): qty 1

## 2024-02-10 MED ORDER — POTASSIUM CHLORIDE 10 MEQ/100ML IV SOLN
10.0000 meq | INTRAVENOUS | Status: AC
  Administered 2024-02-10 (×4): 10 meq via INTRAVENOUS
  Filled 2024-02-10 (×4): qty 100

## 2024-02-10 NOTE — Progress Notes (Signed)
 PT Cancellation Note  Patient Details Name: Danielle Reilly MRN: [id number] DOB: 1975/06/18   Cancelled Treatment:    Reason Eval/Treat Not Completed: Patient at procedure or test/unavailable  Off the floor, en route to CT;   Will follow up for PT eval tomorrow;   Silvano Currier, PT  Acute Rehabilitation Services Office [phone] Secure Chat welcomed    Silvano VEAR Currier 02/10/2024, 5:29 PM

## 2024-02-10 NOTE — H&P (Signed)
 History and Physical    Danielle Reilly FMW:[id number] DOB: April 29, 1975 DOA: 02/09/2024  PCP: Purcell Emil Schanz, MD   Patient coming from: Home   Chief Complaint:  Chief Complaint  Patient presents with   Withdrawal    HPI:  Danielle Reilly is a 48 y.o. female with hx of alcohol use disorder, GERD, alcohol related liver disease, thrombocytopenia, anemia, mood d/o, who presents seeking care for alcohol withdrawal. Reports recent heart racing, tremulous, nauseous + vomiting. Also notes diarrhea. Has associated epigastric pain. Drinks pint of liquor daily. Interested in quitting and interested in meds to reduce cravings like Naltrexone . She is hoping to go home tomorrow. Denies fever, chills, cough / cold, chest pain, SOB. Has had recent falls, not sure about nature of falls can't remember if she passed out. Cut her lip and chipped a tooth, Otherwise notes L neck and shoulder pain, R knee pain.    Review of Systems:  ROS complete and negative except as marked above   Allergies  Allergen Reactions   Fluconazole  Hives   Hydrocodone  Nausea And Vomiting    Prior to Admission medications   Medication Sig Start Date End Date Taking? Authorizing Provider  Azelastine  HCl 137 MCG/SPRAY SOLN PLACE 2 SPRAYS INTO BOTH NOSTRILS 2 (TWO) TIMES DAILY. USE IN EACH NOSTRIL AS DIRECTED 12/03/23   Purcell Emil Schanz, MD  FLUoxetine  (PROZAC ) 20 MG capsule Take 1 capsule (20 mg total) by mouth daily. 11/29/23   Arfeen, Leni DASEN, MD  fluticasone  (FLONASE ) 50 MCG/ACT nasal spray Place 1 spray into both nostrils daily as needed for allergies or rhinitis.    [provider]  folic acid  (FOLVITE ) 1 MG tablet Take 1 tablet (1 mg total) by mouth daily. 01/05/24   Elgergawy, Brayton RAMAN, MD  LORazepam  (ATIVAN ) 1 MG tablet Take 1 tablet (1 mg total) by mouth 3 (three) times daily as needed for anxiety. 10/10/23   Zackowski, Scott, MD  Multiple Vitamin (MULTIVITAMINS PO) Take 1 tablet by mouth daily.     [provider]  naltrexone  (DEPADE) 50 MG tablet Take 1 tablet (50 mg total) by mouth daily. 11/29/23   Arfeen, Leni DASEN, MD  pantoprazole  (PROTONIX ) 40 MG tablet Take 1 tablet (40 mg total) by mouth 2 (two) times daily. Switch for any other PPI at similar dose and frequency 01/10/24   [name], Emil Schanz, MD  sucralfate  (CARAFATE ) 1 GM/10ML suspension Take 10 mLs (1 g total) by mouth 3 (three) times daily. 01/04/24 02/03/24  Elgergawy, Brayton RAMAN, MD  thiamine  (VITAMIN B1) 100 MG tablet Take 1 tablet (100 mg total) by mouth daily. 01/05/24   Elgergawy, Brayton RAMAN, MD  traZODone  (DESYREL ) 50 MG tablet Take 1-2 tablets (50-100 mg total) by mouth at bedtime. 11/29/23   Arfeen, Leni DASEN, MD  famotidine  (PEPCID ) 20 MG tablet Take 1 tablet (20 mg total) by mouth daily. Patient not taking: Reported on 10/28/2019 05/19/19 04/06/20  Dean Clarity, MD    Past Medical History:  Diagnosis Date   Acute pancreatitis    Allergy    SEASONAL   Anemia    Anxiety    A LITTLE   Depression    Gastritis    GERD (gastroesophageal reflux disease)    tums as needed   Hemoperitoneum 12/25/2010   Nausea and vomiting 06/17/2019   Substance abuse (HCC)    ETOH ABUSE   SVD (spontaneous vaginal delivery)    x 1   Toothache 01/29/2013    Past Surgical History:  Procedure Laterality Date   BILATERAL SALPINGECTOMY N/A 06/19/2013   Procedure: BILATERAL SALPINGECTOMY;  Surgeon: Elveria Mungo, MD;  Location: [facility];  Service: Gynecology;  Laterality: N/A;   BIOPSY OF SKIN SUBCUTANEOUS TISSUE AND/OR MUCOUS MEMBRANE  01/02/2024   Procedure: BIOPSY, SKIN, SUBCUTANEOUS TISSUE, OR MUCOUS MEMBRANE;  Surgeon: Stacia Glendia BRAVO, MD;  Location: [facility] ENDOSCOPY;  Service: Gastroenterology;;   DILITATION & CURRETTAGE/HYSTROSCOPY WITH NOVASURE ABLATION N/A 04/04/2013   Procedure: DILATATION & CURETTAGE/HYSTEROSCOPY WITH Attempted NOVASURE ABLATION ;  Surgeon: Elveria Mungo, MD;  Location: [facility];  Service:  Gynecology;  Laterality: N/A;   ESOPHAGOGASTRODUODENOSCOPY N/A 01/02/2024   Procedure: EGD (ESOPHAGOGASTRODUODENOSCOPY);  Surgeon: Stacia Glendia BRAVO, MD;  Location: [facility] ENDOSCOPY;  Service: Gastroenterology;  Laterality: N/A;   HYSTEROSCOPY N/A 05/26/2013   Procedure: UNSUCCESSFUL HYSTEROSCOPY WITH HYDROTHERMAL ABLATION;  Surgeon: Elveria Mungo, MD;  Location: [facility];  Service: Gynecology;  Laterality: N/A;   LAPAROSCOPY  12/25/2010   Procedure: LAPAROSCOPY OPERATIVE;  Surgeon: Olam DELENA Mill, MD;  Location: [facility];  Service: Gynecology;  Laterality: N/A;  Operative laparoscopy /cauterization of right hemorrhagic cyst wall. Removal of belly rings.   LAPAROSCOPY FOR ECTOPIC PREGNANCY     NOVASURE ABLATION N/A 05/26/2013   Procedure: UNSUCCESSFUL NOVASURE ABLATION;  Surgeon: Elveria Mungo, MD;  Location: [facility];  Service: Gynecology;  Laterality: N/A;   uterine biopsy     VAGINAL HYSTERECTOMY N/A 06/19/2013   Procedure:  TOTAL VAGINAL HYSTERECTOMY with bilateral salpingectomy;  Surgeon: Elveria Mungo, MD;  Location: [facility];  Service: Gynecology;  Laterality: N/A;     reports that she has never smoked. She has never used smokeless tobacco. She reports that she does not currently use alcohol. She reports that she does not currently use drugs.  Family History  Problem Relation Age of Onset   Drug abuse Mother    Arthritis Maternal Aunt    Cancer Maternal Aunt    Drug abuse Maternal Aunt    Arthritis Maternal Uncle    Alcohol abuse Maternal Uncle    Cancer Maternal Uncle    Drug abuse Maternal Uncle    Cancer Maternal Grandmother    Alcohol abuse Maternal Grandfather    Cancer Maternal Grandfather    Colon cancer Neg Hx    Colon polyps Neg Hx    Crohn's disease Neg Hx    Esophageal cancer Neg Hx    Rectal cancer Neg Hx    Stomach cancer Neg Hx    Ulcerative colitis Neg Hx      Physical Exam: Vitals:   02/09/24 2030 02/09/24 2230 02/09/24 2321 02/09/24 2334   BP: 106/66 104/70 104/70   Pulse: (!) 114 (!) 115 (!) 115   Resp: 20 (!) 24    Temp:    98.7 F (37.1 C)  TempSrc:    Oral  SpO2: 98% 97%    Weight:      Height:        Gen: Awake, alert, NAD   CV: Regular, tachycardic, normal S1, S2, no murmurs  Resp: Normal WOB, CTAB  Abd: Flat, normoactive, mild epigastric tenderness  MSK: Shoulders are nontender and range without pain. The R knee has some mild swelling and tenderness along the medial joint line. LE Symmetric, no edema  Skin: No rashes or lesions to exposed skin  Neuro: Alert and interactive  Psych: euthymic, appropriate    Data review:   Labs reviewed, notable for:   NA 132 K3.3 Bicarb 24, AG 17 Creatinine 1.1, up from 0.8-0.9  T. bili 2, AST  128, ALT 68, alk phos 89 Hemoglobin 14 up from baseline 9-10 likely hemoconcentrated Micro:  Results for orders placed or performed during the hospital encounter of 12/30/23  Resp panel by RT-PCR (RSV, Flu A&B, Covid) Anterior Nasal Swab     Status: None   Collection Time: 12/30/23 10:36 PM   Specimen: Anterior Nasal Swab  Result Value Ref Range Status   SARS Coronavirus 2 by RT PCR NEGATIVE NEGATIVE Final   Influenza A by PCR NEGATIVE NEGATIVE Final   Influenza B by PCR NEGATIVE NEGATIVE Final    Comment: (NOTE) The Xpert Xpress SARS-CoV-2/FLU/RSV plus assay is intended as an aid in the diagnosis of influenza from Nasopharyngeal swab specimens and should not be used as a sole basis for treatment. Nasal washings and aspirates are unacceptable for Xpert Xpress SARS-CoV-2/FLU/RSV testing.  Fact Sheet for Patients: bloggercourse.com  Fact Sheet for Healthcare Providers: seriousbroker.it  This test is not yet approved or cleared by the [place]  FDA and has been authorized for detection and/or diagnosis of SARS-CoV-2 by FDA under an Emergency Use Authorization (EUA). This EUA will remain in effect (meaning this test  can be used) for the duration of the COVID-19 declaration under Section 564(b)(1) of the Act, 21 U.S.C. section 360bbb-3(b)(1), unless the authorization is terminated or revoked.     Resp Syncytial Virus by PCR NEGATIVE NEGATIVE Final    Comment: (NOTE) Fact Sheet for Patients: bloggercourse.com  Fact Sheet for Healthcare Providers: seriousbroker.it  This test is not yet approved or cleared by the [place]  FDA and has been authorized for detection and/or diagnosis of SARS-CoV-2 by FDA under an Emergency Use Authorization (EUA). This EUA will remain in effect (meaning this test can be used) for the duration of the COVID-19 declaration under Section 564(b)(1) of the Act, 21 U.S.C. section 360bbb-3(b)(1), unless the authorization is terminated or revoked.  Performed at [facility], [address], [place], [place] [address]     Imaging reviewed:  CT ABDOMEN PELVIS W CONTRAST Result Date: 02/09/2024 CLINICAL DATA:  Abdominal pain.  Alcohol withdrawal. EXAM: CT ABDOMEN AND PELVIS WITH CONTRAST TECHNIQUE: Multidetector CT imaging of the abdomen and pelvis was performed using the standard protocol following bolus administration of intravenous contrast. RADIATION DOSE REDUCTION: This exam was performed according to the departmental dose-optimization program which includes automated exposure control, adjustment of the mA and/or kV according to patient size and/or use of iterative reconstruction technique. CONTRAST:  75mL OMNIPAQUE  IOHEXOL  350 MG/ML SOLN COMPARISON:  CT scan 12/30/2023 FINDINGS: Lower chest: The lung bases are clear of acute process. No pleural effusion or pulmonary lesions. The heart is normal in size. No pericardial effusion. The distal esophagus and aorta are unremarkable. Hepatobiliary: Diffuse fatty infiltration of the liver but no worrisome hepatic lesions or intrahepatic biliary dilatation. The gallbladder is  unremarkable. No common bile duct dilatation. The portal and hepatic veins are patent. Pancreas: Unremarkable. No pancreatic ductal dilatation or surrounding inflammatory changes. Spleen: Normal in size without focal abnormality. Adrenals/Urinary Tract: Adrenal glands and kidneys are unremarkable. No renal lesions, renal calculi or hydronephrosis. No findings for pyelonephritis. The bladder is unremarkable. Stomach/Bowel: The stomach, duodenum, small bowel and colon are grossly normal without oral contrast. No inflammatory changes, mass lesions or obstructive findings. The appendix is normal. Vascular/Lymphatic: The aorta is normal in caliber. No dissection. The branch vessels are patent. The major venous structures are patent. Retroaortic left renal vein noted. No mesenteric or retroperitoneal mass or  adenopathy. Small scattered lymph nodes are noted. Reproductive: The uterus is surgically absent. Both ovaries are still present and appear normal. Other: No pelvic mass or adenopathy. No free pelvic fluid collections. No inguinal mass or adenopathy. No abdominal wall hernia or subcutaneous lesions. Musculoskeletal: No significant bony findings. IMPRESSION: 1. No acute abdominal/pelvic findings, mass lesions or adenopathy. 2. Diffuse fatty infiltration of the liver. Electronically Signed   By: MYRTIS Stammer M.D.   On: 02/09/2024 17:29   CT Cervical Spine Wo Contrast Result Date: 02/09/2024 EXAM: CT CERVICAL SPINE WITHOUT CONTRAST 02/09/2024 04:53:15 PM TECHNIQUE: CT of the cervical spine was performed without the administration of intravenous contrast. Multiplanar reformatted images are provided for review. Automated exposure control, iterative reconstruction, and/or weight based adjustment of the mA/kV was utilized to reduce the radiation dose to as low as reasonably achievable. COMPARISON: None available. CLINICAL HISTORY: Neck pain, fall. FINDINGS: CERVICAL SPINE: BONES AND ALIGNMENT: No acute fracture or  traumatic malalignment. DEGENERATIVE CHANGES: Endplate degenerative change and uncovertebral spurring is greatest at C3-C4 and C5-C6. Moderate left foraminal stenosis is present at C5-C6. SOFT TISSUES: No prevertebral soft tissue swelling. IMPRESSION: 1. No acute abnormality of the cervical spine related to the fall. 2. Moderate left neural foraminal stenosis at C5-6. Electronically signed by: Lonni Necessary MD 02/09/2024 05:11 PM EDT RP Workstation: [id number]   CT Head Wo Contrast Result Date: 02/09/2024 EXAM: CT HEAD WITHOUT CONTRAST 02/09/2024 04:53:15 PM TECHNIQUE: CT of the head was performed without the administration of intravenous contrast. Automated exposure control, iterative reconstruction, and/or weight based adjustment of the mA/kV was utilized to reduce the radiation dose to as low as reasonably achievable. COMPARISON: None available. CLINICAL HISTORY: Fall, head injury, brain fog. FINDINGS: BRAIN AND VENTRICLES: No acute hemorrhage. No evidence of acute infarct. No hydrocephalus. No extra-axial collection. No mass effect or midline shift. ORBITS: No acute abnormality. SINUSES: No acute abnormality. SOFT TISSUES AND SKULL: No acute soft tissue abnormality. No skull fracture. IMPRESSION: 1. No acute intracranial abnormality. Electronically signed by: Lonni Necessary MD 02/09/2024 05:10 PM EDT RP Workstation: [id number]    EKG:  Personally reviewed, sinus tachycardia, prolonged QTc 544, diffuse mild ST depression  ED Course:  Treated with Ativan , 2 L NS, GI cocktail, thiamine , multivitamin, folate.   Assessment/Plan:  48 y.o. female with hx alcohol use disorder, GERD, alcohol related liver disease, thrombocytopenia, anemia, mood d/o, who presents seeking care for alcohol withdrawal   Alcohol withdrawal, moderate to severe - Ativan  for CIWA prn -> Since wants to leave [place], will start front loaded and lower dose Librium  with plan to continue taper if she is medically ready   - Will consider for medication assisted treatment for alcohol use disorder, previously has been on naltrexone .  Do not think her mild liver injury would be a contraindication. - Thiamine , multivitamin, folate - TOC for substance use resources  Volume depletion ? Syncope and falls  Significant hemoconcentration on labs, decreased PO may be related to her alcoholic gastritis/GERD in addition to her losses related to alcohol use. Imaging including CT head, C-spine, abdomen pelvis without acute traumatic injuries. - Continue LR at 100 cc an hour - Tigan as needed for nausea vomiting in setting of prolonged Qtc -- Check orthostatics in AM when more volume replete  -- Tele monitoring  -- XR R knee to eval  -- PT eval   Acute on chronic liver injury, hepatocellular pattern Likely alcohol related liver disease. T. bili 2, AST  128, ALT 68, alk phos  89 - Monitor LFT  Prolonged QTc 544 Hypokalemia - Check mag, Phos - Aggressive potassium repletion - Monitor EKG  Incidental findings: Moderate left neural foraminal stenosis at C5-6. Fatty liver  Chronic medical problems: Esophagitis, gastritis, duodenitis: Continue PPI twice daily Thrombocytopenia: Platelets up from baseline may be hemoconcentrated Anemia: Hemoglobin 14 up from baseline 9 -10 likely hemoconcentrated as well.  Anticipate fall back towards 9 with hydration. Mood d/o: Continue home fluoxetine .  Hold her prn trazodone  due to her prolonged QTc   Body mass index is 26.97 kg/m.    DVT prophylaxis:  SCDs Code Status:  Full Code Diet:  Diet Orders (From admission, onward)    None      Family Communication:  None   Consults:  None   Admission status:   Inpatient, Telemetry bed  Severity of Illness: The appropriate patient status for this patient is INPATIENT. Inpatient status is judged to be reasonable and necessary in order to provide the required intensity of service to ensure the patient's safety. The patient's  presenting symptoms, physical exam findings, and initial radiographic and laboratory data in the context of their chronic comorbidities is felt to place them at high risk for further clinical deterioration. Furthermore, it is not anticipated that the patient will be medically stable for discharge from the hospital within 2 midnights of admission.   * I certify that at the point of admission it is my clinical judgment that the patient will require inpatient hospital care spanning beyond 2 midnights from the point of admission due to high intensity of service, high risk for further deterioration and high frequency of surveillance required.*   Dorn Dawson, MD [facility]  How to contact the [facility] Attending or Consulting provider 7A - 7P or covering provider during after hours 7P -7A, for this patient.  Check the care team in [facility] and look for a) attending/consulting [facility] provider listed and b) the [facility] team listed Log into [email or web address] and use [place]'s universal password to access. If you do not have the password, please contact the hospital operator. Locate the [facility] provider you are looking for under [facility] and page to a number that you can be directly reached. If you still have difficulty reaching the provider, please page the [facility] (Director on Call) for the Hospitalists listed on amion for assistance.  02/10/2024, 12:37 AM

## 2024-02-10 NOTE — Plan of Care (Signed)
   Problem: Education: Goal: Knowledge of General Education information will improve Description Including pain rating scale, medication(s)/side effects and non-pharmacologic comfort measures Outcome: Progressing   Problem: Health Behavior/Discharge Planning: Goal: Ability to manage health-related needs will improve Outcome: Progressing

## 2024-02-11 DIAGNOSIS — F10939 Alcohol use, unspecified with withdrawal, unspecified: Secondary | ICD-10-CM | POA: Diagnosis not present

## 2024-02-11 LAB — PHOSPHORUS: Phosphorus: 3 mg/dL (ref 2.5–4.6)

## 2024-02-11 LAB — BASIC METABOLIC PANEL WITH GFR
Anion gap: 9 (ref 5–15)
BUN: 10 mg/dL (ref 6–20)
CO2: 27 mmol/L (ref 22–32)
Calcium: 8.4 mg/dL — ABNORMAL LOW (ref 8.9–10.3)
Chloride: 102 mmol/L (ref 98–111)
Creatinine, Ser: 0.98 mg/dL (ref 0.44–1.00)
GFR, Estimated: >60 mL/min (ref >60)
Glucose, Bld: 100 mg/dL — ABNORMAL HIGH (ref 70–99)
Potassium: 3.3 mmol/L — ABNORMAL LOW (ref 3.5–5.1)
Sodium: 138 mmol/L (ref 135–145)

## 2024-02-11 LAB — CBC WITH DIFFERENTIAL/PLATELET
Abs Immature Granulocytes: 0.03 K/uL (ref 0.00–0.07)
Basophils Absolute: 0 K/uL (ref 0.0–0.1)
Basophils Relative: 0 %
Eosinophils Absolute: 0 K/uL (ref 0.0–0.5)
Eosinophils Relative: 0 %
HCT: 31.1 % — ABNORMAL LOW (ref 36.0–46.0)
Hemoglobin: 10 g/dL — ABNORMAL LOW (ref 12.0–15.0)
Immature Granulocytes: 1 %
Lymphocytes Relative: 38 %
Lymphs Abs: 1.8 K/uL (ref 0.7–4.0)
MCH: 27.9 pg (ref 26.0–34.0)
MCHC: 32.2 g/dL (ref 30.0–36.0)
MCV: 86.9 fL (ref 80.0–100.0)
Monocytes Absolute: 0.4 K/uL (ref 0.1–1.0)
Monocytes Relative: 7 %
Neutro Abs: 2.6 K/uL (ref 1.7–7.7)
Neutrophils Relative %: 54 %
Platelets: 137 K/uL — ABNORMAL LOW (ref 150–400)
RBC: 3.58 MIL/uL — ABNORMAL LOW (ref 3.87–5.11)
RDW: 14.1 % (ref 11.5–15.5)
WBC: 4.8 K/uL (ref 4.0–10.5)
nRBC: 0 % (ref 0.0–0.2)

## 2024-02-11 MED ORDER — POTASSIUM CHLORIDE CRYS ER 20 MEQ PO TBCR
40.0000 meq | EXTENDED_RELEASE_TABLET | Freq: Once | ORAL | Status: AC
  Administered 2024-02-11: 40 meq via ORAL
  Filled 2024-02-11: qty 2

## 2024-02-11 MED ORDER — LIDOCAINE VISCOUS HCL 2 % MT SOLN
15.0000 mL | OROMUCOSAL | Status: DC | PRN
  Administered 2024-02-12: 15 mL via OROMUCOSAL
  Filled 2024-02-11 (×2): qty 15

## 2024-02-11 NOTE — Plan of Care (Signed)
  Problem: Clinical Measurements: Goal: Will remain free from infection Outcome: Progressing   Problem: Coping: Goal: Level of anxiety will decrease Outcome: Progressing   Problem: Pain Managment: Goal: General experience of comfort will improve and/or be controlled Outcome: Progressing

## 2024-02-11 NOTE — Progress Notes (Signed)
 Pt has a blister on inner lower lip.  Pt c/o of pain from blister.  Notified MD.

## 2024-02-11 NOTE — Progress Notes (Signed)
  Progress Note   Patient: Danielle Reilly FMW:[id number] DOB: 10/20/1975 DOA: 02/09/2024     2 DOS: the patient was seen and examined on 02/11/2024   Brief hospital course: The patient is a 48 yr old woman who presented to [facility] ED on 10/25 2025 with complaints of heart racind tremulousness, nausea and vomiting. She also complains of diarrhea and epigastric pain. She has also had recent falls.  She drinks a pint of liquor a day. She wants to stop and wants help with withdrawal. She has expressed an interest in Naltrexone .    She was admitted on  CIWA protocol earlier this morning by my colleague, Dr. Segars.    The patient is resting comfortably. No new complaints. Heart and lung sounds were within normal limits. Abdomen was soft, non-tender, non-distended. She was mildly tremulous.   She is receiving a librium  taper, folic acid , thiamine  and magnesium  supplementation.  Assessment and Plan: No notes have been filed under this hospital service. Service: Hospitalist     {Tip this will not be part of the note when signed Body mass index is 26.97 kg/m. , ,  (Optional):26781}  Subjective: ***  Physical Exam: Vitals:   02/11/24 0819 02/11/24 1000 02/11/24 1430 02/11/24 1608  BP: 103/61 106/65 (!) 95/55 100/62  Pulse: (!) 101 (!) 101 (!) 101 90  Resp: 17  16 14   Temp: 98.9 F (37.2 C)  98.2 F (36.8 C) 98.2 F (36.8 C)  TempSrc:   Oral Oral  SpO2: 100%  98% 100%  Weight:      Height:       *** Data Reviewed: {Tip this will not be part of the note when signed- Document your independent interpretation of telemetry tracing, EKG, lab, Radiology test or any other diagnostic tests. Add any new diagnostic test ordered today. (Optional):26781} {Results:26384}  Family Communication: ***  Disposition: Status is: Inpatient {Inpatient:23812}  Planned Discharge Destination: {DISCHARGE DESTINATION_TRH:27031} {Tip this will not be part of the note when signed  DVT Prophylaxis  ., Scds   (Optional):26781}   Time spent: *** minutes  Author: Hikeem Andersson, DO 02/11/2024 7:04 PM  For on call review [email or web address].

## 2024-02-11 NOTE — Plan of Care (Signed)

## 2024-02-11 NOTE — Evaluation (Signed)
 Physical Therapy Evaluation and Discharge Patient Details Name: Danielle Reilly MRN: [id number] DOB: 1975-05-04 Today's Date: 02/11/2024  History of Present Illness  48 y.o. female presented to [facility] ED on 10/25 2025 with complaints of heart racing tremulousness, nausea and vomiting. Past medical history significant of alcohol abuse, GERD, anxiety.  Clinical Impression  Patient evaluated by Physical Therapy with no further acute PT needs identified. All education has been completed and the patient has no further questions. Previously independent, works as a civil service fast streamer. States she is close to baseline with mobility, has been walking throughout room unassisted. Pt without dizziness during visit (see orthostatics below.) Amble to ambulate slowly with mild instability but self corrects, and is not open to trying [facility]. Educated on safety, symptom awareness, and gradual return to activity as symptoms allow. All questions answered. See below for any follow-up Physical Therapy or equipment needs. PT is signing off. Thank you for this referral.      02/11/24 0900  Orthostatic Lying   BP- Lying 107/64  Pulse- Lying 109  Orthostatic Sitting  BP- Sitting 104/71  Pulse- Sitting 108  Orthostatic Standing at 0 minutes  BP- Standing at 0 minutes 103/74  Pulse- Standing at 0 minutes 103  Orthostatic Standing at 3 minutes  BP- Standing at 3 minutes 100/72  Pulse- Standing at 3 minutes 118         If plan is discharge home, recommend the following: Assist for transportation     Equipment Recommendations None recommended by PT (Declines trying/need for [facility])     Functional Status Assessment Patient has had a recent decline in their functional status and demonstrates the ability to make significant improvements in function in a reasonable and predictable amount of time.     Precautions / Restrictions Precautions Precautions: Fall Recall of Precautions/Restrictions: Intact Restrictions Weight  Bearing Restrictions Per Provider Order: No      Mobility  Bed Mobility Overal bed mobility: Modified Independent             General bed mobility comments: extra time no assist.    Transfers Overall transfer level: Modified independent Equipment used: None               General transfer comment: Declines need for AD. Minimal instability noted, good power up, performed from bed and toilet without assist.    Ambulation/Gait Ambulation/Gait assistance: Modified independent (Device/Increase time) Gait Distance (Feet): 75 Feet Assistive device: None Gait Pattern/deviations: Step-through pattern, Decreased stride length, Narrow base of support Gait velocity: dec Gait velocity interpretation: <1.31 ft/sec, indicative of household ambulator   General Gait Details: Slow and guarded without overt LOB. Declines to try AD but would likely benefit from [facility].  States she feels confident without and feels at baseline with mobility. No dizziness.  Stairs            Wheelchair Mobility     Tilt Bed    Modified Rankin (Stroke Patients Only)       Balance Overall balance assessment: Mild deficits observed, not formally tested                                           Pertinent Vitals/Pain Pain Assessment Pain Assessment: Faces Faces Pain Scale: Hurts whole lot Pain Location: belly Pain Descriptors / Indicators: Aching Pain Intervention(s): Limited activity within patient's tolerance, Monitored during session, Repositioned  Home Living Family/patient expects to be discharged to:: Private residence Living Arrangements: Children Available Help at Discharge: Family;Available PRN/intermittently Type of Home: Apartment Home Access: Level entry       Home Layout: One level Home Equipment: None Additional Comments: Lives in 1st floor apartment with son.    Prior Function Prior Level of Function : Independent/Modified  Independent;Working/employed;Driving             Mobility Comments: Ind ADLs Comments: Ind - works as Secretary/administrator Upper Extremity Assessment: Defer to OT evaluation    Lower Extremity Assessment Lower Extremity Assessment: Overall WFL for tasks assessed       Communication   Communication Communication: No apparent difficulties    Cognition Arousal: Alert Behavior During Therapy: WFL for tasks assessed/performed   PT - Cognitive impairments: No apparent impairments                         Following commands: Intact       Cueing Cueing Techniques: Verbal cues     General Comments General comments (skin integrity, edema, etc.): Orthostatics taken see vitals. No dizziness.    Exercises     Assessment/Plan    PT Assessment Patient does not need any further PT services  PT Problem List         PT Treatment Interventions      PT Goals (Current goals can be found in the Care Plan section)  Acute Rehab PT Goals Patient Stated Goal: Go home PT Goal Formulation: All assessment and education complete, DC therapy    Frequency       Co-evaluation               AM-PAC PT 6 Clicks Mobility  Outcome Measure Help needed turning from your back to your side while in a flat bed without using bedrails?: None Help needed moving from lying on your back to sitting on the side of a flat bed without using bedrails?: None Help needed moving to and from a bed to a chair (including a wheelchair)?: None Help needed standing up from a chair using your arms (e.g., wheelchair or bedside chair)?: None Help needed to walk in hospital room?: None Help needed climbing 3-5 steps with a railing? : A Little 6 Click Score: 23    End of Session   Activity Tolerance: Patient tolerated treatment well Patient left: in bed;with call bell/phone within reach;with bed alarm set Nurse Communication:  Mobility status PT Visit Diagnosis: Unsteadiness on feet (R26.81);Other abnormalities of gait and mobility (R26.89);History of falling (Z91.81);Other symptoms and signs involving the nervous system (R29.898);Pain Pain - part of body:  (abdomen)    Time: 9146-9086 PT Time Calculation (min) (ACUTE ONLY): 20 min   Charges:   PT Evaluation $PT Eval Low Complexity: 1 Low   PT General Charges $$ ACUTE PT VISIT: 1 Visit         Danielle Reilly, PT, DPT [facility]  Rehabilitation Services Physical Therapist Office: [phone] Website: [email or web address]   Danielle Reilly 02/11/2024, 12:23 PM

## 2024-02-12 ENCOUNTER — Ambulatory Visit (HOSPITAL_COMMUNITY): Payer: MEDICAID | Admitting: Licensed Clinical Social Worker

## 2024-02-12 DIAGNOSIS — F10939 Alcohol use, unspecified with withdrawal, unspecified: Secondary | ICD-10-CM | POA: Diagnosis not present

## 2024-02-12 LAB — CBC WITH DIFFERENTIAL/PLATELET
Abs Immature Granulocytes: 0.01 K/uL (ref 0.00–0.07)
Basophils Absolute: 0 K/uL (ref 0.0–0.1)
Basophils Relative: 0 %
Eosinophils Absolute: 0 K/uL (ref 0.0–0.5)
Eosinophils Relative: 1 %
HCT: 31.9 % — ABNORMAL LOW (ref 36.0–46.0)
Hemoglobin: 10.3 g/dL — ABNORMAL LOW (ref 12.0–15.0)
Immature Granulocytes: 0 %
Lymphocytes Relative: 38 %
Lymphs Abs: 1.9 K/uL (ref 0.7–4.0)
MCH: 28 pg (ref 26.0–34.0)
MCHC: 32.3 g/dL (ref 30.0–36.0)
MCV: 86.7 fL (ref 80.0–100.0)
Monocytes Absolute: 0.3 K/uL (ref 0.1–1.0)
Monocytes Relative: 7 %
Neutro Abs: 2.7 K/uL (ref 1.7–7.7)
Neutrophils Relative %: 54 %
Platelets: 153 K/uL (ref 150–400)
RBC: 3.68 MIL/uL — ABNORMAL LOW (ref 3.87–5.11)
RDW: 14.2 % (ref 11.5–15.5)
WBC: 5 K/uL (ref 4.0–10.5)
nRBC: 0 % (ref 0.0–0.2)

## 2024-02-12 LAB — BASIC METABOLIC PANEL WITH GFR
Anion gap: 8 (ref 5–15)
BUN: 9 mg/dL (ref 6–20)
CO2: 28 mmol/L (ref 22–32)
Calcium: 8.7 mg/dL — ABNORMAL LOW (ref 8.9–10.3)
Chloride: 101 mmol/L (ref 98–111)
Creatinine, Ser: 1.09 mg/dL — ABNORMAL HIGH (ref 0.44–1.00)
GFR, Estimated: >60 mL/min (ref >60)
Glucose, Bld: 107 mg/dL — ABNORMAL HIGH (ref 70–99)
Potassium: 4 mmol/L (ref 3.5–5.1)
Sodium: 137 mmol/L (ref 135–145)

## 2024-02-12 MED ORDER — DICLOFENAC SODIUM 1 % EX GEL
2.0000 g | Freq: Four times a day (QID) | CUTANEOUS | Status: DC
  Administered 2024-02-12 – 2024-02-13 (×4): 2 g via TOPICAL
  Filled 2024-02-12: qty 100

## 2024-02-12 NOTE — TOC CAGE-AID Note (Signed)
 Transition of Care [facility]) - CAGE-AID Screening   Patient Details  Name: Danielle Reilly MRN: [id number] Date of Birth: [birth year]  Transition of [facility] CM/SW Contact:    Bridget Cordella Simmonds, LCSW Phone Number: 02/12/2024, 11:36 AM   Clinical Narrative:CSW met with pt for Cage aid.  Pt reports she relapsed one week ago, has been drinking a pint a day since then.  States that she keeps telling her self that she can have just one and this gets her into trouble.  Treatment options discussed and pt is wanting to reengage with AA meetings, does have the online meeting guide.  Pt stating she cannot go away for residential treatment right now.  CSW provided list of outpt treatment providers.  Pt verbalizing understanding that if she cannot maintain sobriety with AA meetings alone, she will need to seek higher level of treatment.  Pt also asking for assistance with rent for this  month, resource list with [employer] and DSS provided.     CAGE-AID Screening:    Have You Ever Felt You Ought to Cut Down on Your Drinking or Drug Use?: Yes Have People Annoyed You By Critizing Your Drinking Or Drug Use?: No Have You Felt Bad Or Guilty About Your Drinking Or Drug Use?: Yes Have You Ever Had a Drink or Used Drugs First Thing In The Morning to Steady Your Nerves or to Get Rid of a Hangover?: No CAGE-AID Score: 2  Substance Abuse Education Offered: Yes  Substance abuse interventions: Patient Counseling, Other (must comment) (provider resource list)

## 2024-02-12 NOTE — Progress Notes (Signed)
 Progress Note   Patient: Danielle Reilly FMW:[id number] DOB: 05/27/1975 DOA: 02/09/2024     3 DOS: the patient was seen and examined on 02/12/2024   Brief hospital course: The patient is a 48 yr old woman who presented to [facility] ED on 10/25 2025 with complaints of heart racind tremulousness, nausea and vomiting. She also complains of diarrhea and epigastric pain. She has also had recent falls.  She drinks a pint of liquor a day. She wants to stop and wants help with withdrawal. She has expressed an interest in Naltrexone .    She was admitted on  CIWA protocol earlier this morning by my colleague, Dr. Segars.    The patient is resting comfortably. No new complaints. Heart and lung sounds were within normal limits. Abdomen was soft, non-tender, non-distended. She was mildly tremulous.   She is receiving a librium  taper, folic acid , thiamine  and magnesium  supplementation.  CIWA's have been in the 6-8 range in the last 24 hours. Her last drink was on Saturday. Continue to monitor.  Assessment and Plan:  48 y.o. female with hx alcohol use disorder, GERD, alcohol related liver disease, thrombocytopenia, anemia, mood d/o, who presents seeking care for alcohol withdrawal    Alcohol withdrawal, moderate to severe - Ativan  for CIWA prn -Continue taper.  - Will consider for medication assisted treatment for alcohol use disorder, previously has been on naltrexone .  Do not think her mild liver injury would be a contraindication. - Thiamine , multivitamin, folate - TOC for substance use resources --Will likely discharge to home on 02/12/2024 if she remains stable.    Volume depletion ? Syncope and falls  Patient is now taking increased PO. Will dc maintenance fluids and recheck orthostatics.   Acute on chronic liver injury, hepatocellular pattern Likely alcohol related liver disease. T. bili 2, AST  128, ALT 68, alk phos 89 - Monitor LFT   Prolonged QTc 544 Hypokalemia - Magnesium  replete --  potassium replete --Phos is stable   Incidental findings: Moderate left neural foraminal stenosis at C5-6. Fatty liver   Chronic medical problems: Esophagitis, gastritis, duodenitis: Continue PPI twice daily Thrombocytopenia: Platelets up from baseline may be hemoconcentrated Anemia: Hemoglobin 14 up from baseline 9 -10 likely hemoconcentrated as well.  Anticipate fall back towards 9 with hydration. Mood d/o: Continue home fluoxetine .  Hold her prn trazodone  due to her prolonged QTc   Body mass index is 26.97 kg/m.     DVT prophylaxis:  SCDs Code Status:  Full Code Diet:  Diet Orders (From admission, onward)      None         Family Communication:  None   Consults:  None   Admission status:   Inpatient, Telemetry bed      Subjective: The patient is resting comfortably. No new complaints.   Physical Exam: Vitals:   02/12/24 0400 02/12/24 0802 02/12/24 1500 02/12/24 1531  BP: 121/81 114/78 (!) 102/56 124/72  Pulse: 99 100 (!) 101 97  Resp: 15 18  16   Temp: 98.1 F (36.7 C) 97.9 F (36.6 C)  97.9 F (36.6 C)  TempSrc: Oral     SpO2: 99% 100%  100%  Weight:      Height:       Exam:  Constitutional:  The patient is awake, alert, and oriented x 3. No acute distress. Eyes:  pupils and irises appear normal Normal lids and conjunctivae ENMT:  grossly normal hearing  Lips appear normal external ears, nose appear normal Oropharynx: mucosa,  tongue,posterior pharynx appear normal Neck:  neck appears normal, no masses, normal ROM, supple no thyromegaly Respiratory:  No increased work of breathing. No wheezes, rales, or rhonchi No tactile fremitus Cardiovascular:  Regular rate and rhythm No murmurs, ectopy, or gallups. No lateral PMI. No thrills. Abdomen:  Abdomen is soft, non-tender, non-distended No hernias, masses, or organomegaly Normoactive bowel sounds.  Musculoskeletal:  No cyanosis, clubbing, or edema Skin:  No rashes, lesions, ulcers palpation  of skin: no induration or nodules Neurologic:  CN 2-12 intact Sensation all 4 extremities intact Psychiatric:  Mental status Mood, affect appropriate Orientation to person, place, time  judgment and insight appear intact  Data Reviewed:  CBC, BMP  Family Communication: None available.  Disposition: Status is: Inpatient Remains inpatient appropriate because: Alcohol withdrawal. Need for electrolyte replacement.  Planned Discharge Destination: Home    Time spent: 32 minutes  Author: Azha Constantin, DO 02/12/2024 5:26 PM  For on call review [email or web address].

## 2024-02-13 ENCOUNTER — Other Ambulatory Visit (HOSPITAL_COMMUNITY): Payer: Self-pay | Admitting: Psychiatry

## 2024-02-13 DIAGNOSIS — F4312 Post-traumatic stress disorder, chronic: Secondary | ICD-10-CM

## 2024-02-13 DIAGNOSIS — Z62819 Personal history of unspecified abuse in childhood: Secondary | ICD-10-CM

## 2024-02-13 DIAGNOSIS — F322 Major depressive disorder, single episode, severe without psychotic features: Secondary | ICD-10-CM

## 2024-02-13 DIAGNOSIS — F10188 Alcohol abuse with other alcohol-induced disorder: Secondary | ICD-10-CM

## 2024-02-13 DIAGNOSIS — F10939 Alcohol use, unspecified with withdrawal, unspecified: Secondary | ICD-10-CM | POA: Diagnosis not present

## 2024-02-13 LAB — CBC WITH DIFFERENTIAL/PLATELET
Abs Immature Granulocytes: 0.02 K/uL (ref 0.00–0.07)
Basophils Absolute: 0 K/uL (ref 0.0–0.1)
Basophils Relative: 0 %
Eosinophils Absolute: 0 K/uL (ref 0.0–0.5)
Eosinophils Relative: 1 %
HCT: 32.6 % — ABNORMAL LOW (ref 36.0–46.0)
Hemoglobin: 10.5 g/dL — ABNORMAL LOW (ref 12.0–15.0)
Immature Granulocytes: 0 %
Lymphocytes Relative: 33 %
Lymphs Abs: 1.9 K/uL (ref 0.7–4.0)
MCH: 28.2 pg (ref 26.0–34.0)
MCHC: 32.2 g/dL (ref 30.0–36.0)
MCV: 87.4 fL (ref 80.0–100.0)
Monocytes Absolute: 0.5 K/uL (ref 0.1–1.0)
Monocytes Relative: 9 %
Neutro Abs: 3.2 K/uL (ref 1.7–7.7)
Neutrophils Relative %: 57 %
Platelets: 169 K/uL (ref 150–400)
RBC: 3.73 MIL/uL — ABNORMAL LOW (ref 3.87–5.11)
RDW: 14.6 % (ref 11.5–15.5)
WBC: 5.6 K/uL (ref 4.0–10.5)
nRBC: 0 % (ref 0.0–0.2)

## 2024-02-13 LAB — BASIC METABOLIC PANEL WITH GFR
Anion gap: 8 (ref 5–15)
BUN: 12 mg/dL (ref 6–20)
CO2: 26 mmol/L (ref 22–32)
Calcium: 8.8 mg/dL — ABNORMAL LOW (ref 8.9–10.3)
Chloride: 101 mmol/L (ref 98–111)
Creatinine, Ser: 1.03 mg/dL — ABNORMAL HIGH (ref 0.44–1.00)
GFR, Estimated: >60 mL/min (ref >60)
Glucose, Bld: 102 mg/dL — ABNORMAL HIGH (ref 70–99)
Potassium: 3.9 mmol/L (ref 3.5–5.1)
Sodium: 135 mmol/L (ref 135–145)

## 2024-02-13 MED ORDER — ADULT MULTIVITAMIN W/MINERALS CH: 1.0000 | ORAL_TABLET | Freq: Every day | ORAL | 0 refills | Status: AC

## 2024-02-13 MED ORDER — LIDOCAINE VISCOUS HCL 2 % MT SOLN: 15.0000 mL | OROMUCOSAL | 0 refills | Status: AC | PRN

## 2024-02-13 MED ORDER — DICLOFENAC SODIUM 1 % EX GEL: 2.0000 g | Freq: Four times a day (QID) | CUTANEOUS | 0 refills | Status: DC

## 2024-02-13 NOTE — Progress Notes (Signed)
 The patient may drive herself home.

## 2024-02-14 ENCOUNTER — Telehealth: Payer: Self-pay

## 2024-02-14 NOTE — Transitions of Care (Post Inpatient/ED Visit) (Unsigned)
   02/14/2024  Name: Danielle Reilly MRN: [id number] DOB: [birth year]  Today's TOC FU Call Status: Today's TOC FU Call Status:: Unsuccessful Call (1st Attempt) Unsuccessful Call (1st Attempt) Date: 02/14/24  Attempted to reach the patient regarding the most recent Inpatient/ED visit.  Follow Up Plan: Additional outreach attempts will be made to reach the patient to complete the Transitions of Care (Post Inpatient/ED visit) call.   Signature Julian Lemmings, LPN [facility] Nurse Health Advisor Direct Dial [phone]

## 2024-02-14 NOTE — Discharge Summary (Signed)
 Physician Discharge Summary   Patient: Danielle Reilly MRN: [id number] DOB: 05/22/1975  Admit date:     02/09/2024  Discharge date: 02/13/2024  Discharge Physician: Brigida Bureau   PCP: Purcell Emil Schanz, MD   Recommendations at discharge:    Discharge to home Resume lantrexone Seek assistance with sobreity from AA. Follow up with PCP in 7-10 days.  Discharge Diagnoses: Active Problems:   Alcohol withdrawal (HCC)  Resolved Problems:   * No resolved hospital problems. [facility] Course: The patient is a 48 yr old woman who presented to [facility] ED on 10/25 2025 with complaints of heart racind tremulousness, nausea and vomiting. She also complains of diarrhea and epigastric pain. She has also had recent falls.  She drinks a pint of liquor a day. She wants to stop and wants help with withdrawal. She has expressed an interest in Naltrexone .    She was admitted on  CIWA protocol earlier this morning by my colleague, Dr. Segars.    The patient is resting comfortably. No new complaints. Heart and lung sounds were within normal limits. Abdomen was soft, non-tender, non-distended. She was mildly tremulous.   She is receiving a librium  taper, folic acid , thiamine  and magnesium  supplementation.   The patient's CIWA scores stabilized. She was discharged to home in stable condition. She intended to pursue her sobriety with the help of AA.  Assessment and Plan: 48 y.o. female with hx alcohol use disorder, GERD, alcohol related liver disease, thrombocytopenia, anemia, mood d/o, who presents seeking care for alcohol withdrawal    Alcohol withdrawal, moderate to severe - Ativan  for CIWA prn -Continue taper.  - Will consider for medication assisted treatment for alcohol use disorder, previously has been on naltrexone .  Do not think her mild liver injury would be a contraindication. - Thiamine , multivitamin, folate - TOC for substance use resources --Patient will be discharged to home in fair  condition   Volume depletion ? Syncope and falls  Pt was given IV fluids. Orthostatics normalized.   Acute on chronic liver injury, hepatocellular pattern Likely alcohol related liver disease. T. bili 2, AST  128, ALT 68, alk phos 89 - LFT's were monitored and stabilized.   Prolonged QTc 544 Hypokalemia - Magnesium  replete -- potassium replete --Phos is stable   Incidental findings: Moderate left neural foraminal stenosis at C5-6. Fatty liver   Chronic medical problems: Esophagitis, gastritis, duodenitis: Continue PPI twice daily Thrombocytopenia: Platelets up from baseline may be hemoconcentrated Anemia: Hemoglobin 14 up from baseline 9 -10 likely hemoconcentrated as well.  Anticipate fall back towards 9 with hydration. Mood d/o: Continue home fluoxetine .  Hold her prn trazodone  due to her prolonged QTc   Body mass index is 26.97 kg/m.     DVT prophylaxis:  SCDs Code Status:  Full Code Diet:  Diet Orders (From admission, onward)          Consultants: None Procedures performed: None  Disposition: Home Diet recommendation:  Discharge Diet Orders (From admission, onward)     Start     Ordered   02/13/24 0000  Diet - low sodium heart healthy        02/13/24 1125           Cardiac diet DISCHARGE MEDICATION: Allergies as of 02/13/2024       Reactions   Fluconazole  Hives   Hydrocodone  Nausea And Vomiting        Medication List     STOP taking these medications    chlorhexidine 0.12 %  solution Commonly known as: PERIDEX   clindamycin 300 MG capsule Commonly known as: CLEOCIN   LORazepam  1 MG tablet Commonly known as: Ativan        TAKE these medications    Azelastine  HCl 137 MCG/SPRAY Soln PLACE 2 SPRAYS INTO BOTH NOSTRILS 2 (TWO) TIMES DAILY. USE IN EACH NOSTRIL AS DIRECTED What changed: See the new instructions.   diclofenac Sodium 1 % Gel Commonly known as: VOLTAREN Apply 2 g topically 4 (four) times daily.   FLUoxetine  20 MG  capsule Commonly known as: PROZAC  Take 1 capsule (20 mg total) by mouth daily.   folic acid  1 MG tablet Commonly known as: FOLVITE  Take 1 tablet (1 mg total) by mouth daily.   lidocaine  2 % solution Commonly known as: XYLOCAINE  Use as directed 15 mLs in the mouth or throat every 3 (three) hours as needed for mouth pain.   multivitamin with minerals Tabs tablet Take 1 tablet by mouth daily.   naltrexone  50 MG tablet Commonly known as: DEPADE Take 1 tablet (50 mg total) by mouth daily.   pantoprazole  40 MG tablet Commonly known as: PROTONIX  Take 1 tablet (40 mg total) by mouth 2 (two) times daily. Switch for any other PPI at similar dose and frequency What changed:  when to take this additional instructions   sucralfate  1 GM/10ML suspension Commonly known as: CARAFATE  Take 10 mLs (1 g total) by mouth 3 (three) times daily. What changed:  when to take this reasons to take this   thiamine  100 MG tablet Commonly known as: VITAMIN B1 Take 1 tablet (100 mg total) by mouth daily.   traZODone  50 MG tablet Commonly known as: DESYREL  Take 1-2 tablets (50-100 mg total) by mouth at bedtime. What changed:  how much to take when to take this reasons to take this        Follow-up Information     [name], Emil Schanz, MD Follow up.   Specialty: Internal Medicine Contact information: [address] [place] [address] [phone]                Discharge Exam: Fredricka Weights   02/09/24 1903  Weight: 75.8 kg   Exam:  Constitutional:  The patient is awake, alert, and oriented x 3. No acute distress. Eyes:  pupils and irises appear normal Normal lids and conjunctivae ENMT:  grossly normal hearing  Lips appear normal external ears, nose appear normal Oropharynx: mucosa, tongue,posterior pharynx appear normal Neck:  neck appears normal, no masses, normal ROM, supple no thyromegaly Respiratory:  No increased work of breathing. No wheezes, rales,  or rhonchi No tactile fremitus Cardiovascular:  Regular rate and rhythm No murmurs, ectopy, or gallups. No lateral PMI. No thrills. Abdomen:  Abdomen is soft, non-tender, non-distended No hernias, masses, or organomegaly Normoactive bowel sounds.  Musculoskeletal:  No cyanosis, clubbing, or edema Skin:  No rashes, lesions, ulcers palpation of skin: no induration or nodules Neurologic:  CN 2-12 intact Sensation all 4 extremities intact Psychiatric:  Mental status Mood, affect appropriate Orientation to person, place, time  judgment and insight appear intact   Condition at discharge: fair  The results of significant diagnostics from this hospitalization (including imaging, microbiology, ancillary and laboratory) are listed below for reference.   Imaging Studies: CT ABDOMEN PELVIS W WO CONTRAST Result Date: 02/11/2024 EXAM: CT ABDOMEN AND PELVIS WITH AND WITHOUT CONTRAST 02/10/2024 05:42:57 PM TECHNIQUE: CT of the abdomen and pelvis was performed with and without the administration of 75 mL of  iohexol  (OMNIPAQUE ) 350 MG/ML injection. Multiplanar reformatted images are provided for review. Automated exposure control, iterative reconstruction, and/or weight-based adjustment of the mA/kV was utilized to reduce the radiation dose to as low as reasonably achievable. COMPARISON: 02/09/2024 CLINICAL HISTORY: RLQ abdominal pain. FINDINGS: LOWER CHEST: No acute abnormality. LIVER: Moderate hepatic steatosis again noted. GALLBLADDER AND BILE DUCTS: Tiny gallstones versus layering gallbladder sludge. No signs of acute cholecystitis or biliary ductal dilatation. SPLEEN: No acute abnormality. PANCREAS: No acute abnormality. ADRENAL GLANDS: No acute abnormality. KIDNEYS, URETERS AND BLADDER: No stones in the kidneys or ureters. No hydronephrosis. No perinephric or periureteral stranding. Contrast noted in the urinary bladder from a prior CT. GI AND BOWEL: Stomach demonstrates no acute abnormality.  Normal appendix visualized. There is no bowel obstruction. PERITONEUM AND RETROPERITONEUM: No ascites. No free air. VASCULATURE: Aorta is normal in caliber. LYMPH NODES: No lymphadenopathy. REPRODUCTIVE ORGANS: No acute abnormality. BONES AND SOFT TISSUES: No acute osseous abnormality. No focal soft tissue abnormality. IMPRESSION: 1. No acute findings in the abdomen or pelvis. 2. Moderate hepatic steatosis. 3. Tiny gallstones versus layering gallbladder sludge. Electronically signed by: Norleen Kil MD 02/11/2024 05:28 AM EDT RP Workstation: [id number]   DG Knee Right Port Result Date: 02/10/2024 EXAM: 4 VIEW(S) XRAY OF THE RIGHT KNEE 02/10/2024 01:22:18 AM COMPARISON: None available. CLINICAL HISTORY: [id number] Knee pain [id number]. Rt knee pain after fall FINDINGS: BONES AND JOINTS: No acute fracture. No focal osseous lesion. No joint dislocation. No significant joint effusion. No significant degenerative changes. SOFT TISSUES: The soft tissues are unremarkable. IMPRESSION: 1. No significant abnormality. Electronically signed by: Pinkie Pebbles MD 02/10/2024 01:24 AM EDT RP Workstation: [id number]   CT ABDOMEN PELVIS W CONTRAST Result Date: 02/09/2024 CLINICAL DATA:  Abdominal pain.  Alcohol withdrawal. EXAM: CT ABDOMEN AND PELVIS WITH CONTRAST TECHNIQUE: Multidetector CT imaging of the abdomen and pelvis was performed using the standard protocol following bolus administration of intravenous contrast. RADIATION DOSE REDUCTION: This exam was performed according to the departmental dose-optimization program which includes automated exposure control, adjustment of the mA and/or kV according to patient size and/or use of iterative reconstruction technique. CONTRAST:  75mL OMNIPAQUE  IOHEXOL  350 MG/ML SOLN COMPARISON:  CT scan 12/30/2023 FINDINGS: Lower chest: The lung bases are clear of acute process. No pleural effusion or pulmonary lesions. The heart is normal in size. No pericardial effusion. The distal esophagus  and aorta are unremarkable. Hepatobiliary: Diffuse fatty infiltration of the liver but no worrisome hepatic lesions or intrahepatic biliary dilatation. The gallbladder is unremarkable. No common bile duct dilatation. The portal and hepatic veins are patent. Pancreas: Unremarkable. No pancreatic ductal dilatation or surrounding inflammatory changes. Spleen: Normal in size without focal abnormality. Adrenals/Urinary Tract: Adrenal glands and kidneys are unremarkable. No renal lesions, renal calculi or hydronephrosis. No findings for pyelonephritis. The bladder is unremarkable. Stomach/Bowel: The stomach, duodenum, small bowel and colon are grossly normal without oral contrast. No inflammatory changes, mass lesions or obstructive findings. The appendix is normal. Vascular/Lymphatic: The aorta is normal in caliber. No dissection. The branch vessels are patent. The major venous structures are patent. Retroaortic left renal vein noted. No mesenteric or retroperitoneal mass or adenopathy. Small scattered lymph nodes are noted. Reproductive: The uterus is surgically absent. Both ovaries are still present and appear normal. Other: No pelvic mass or adenopathy. No free pelvic fluid collections. No inguinal mass or adenopathy. No abdominal wall hernia or subcutaneous lesions. Musculoskeletal: No significant bony findings. IMPRESSION: 1. No acute abdominal/pelvic findings, mass lesions or adenopathy. 2. Diffuse  fatty infiltration of the liver. Electronically Signed   By: MYRTIS Stammer M.D.   On: 02/09/2024 17:29   CT Cervical Spine Wo Contrast Result Date: 02/09/2024 EXAM: CT CERVICAL SPINE WITHOUT CONTRAST 02/09/2024 04:53:15 PM TECHNIQUE: CT of the cervical spine was performed without the administration of intravenous contrast. Multiplanar reformatted images are provided for review. Automated exposure control, iterative reconstruction, and/or weight based adjustment of the mA/kV was utilized to reduce the radiation dose  to as low as reasonably achievable. COMPARISON: None available. CLINICAL HISTORY: Neck pain, fall. FINDINGS: CERVICAL SPINE: BONES AND ALIGNMENT: No acute fracture or traumatic malalignment. DEGENERATIVE CHANGES: Endplate degenerative change and uncovertebral spurring is greatest at C3-C4 and C5-C6. Moderate left foraminal stenosis is present at C5-C6. SOFT TISSUES: No prevertebral soft tissue swelling. IMPRESSION: 1. No acute abnormality of the cervical spine related to the fall. 2. Moderate left neural foraminal stenosis at C5-6. Electronically signed by: Lonni Necessary MD 02/09/2024 05:11 PM EDT RP Workstation: [id number]   CT Head Wo Contrast Result Date: 02/09/2024 EXAM: CT HEAD WITHOUT CONTRAST 02/09/2024 04:53:15 PM TECHNIQUE: CT of the head was performed without the administration of intravenous contrast. Automated exposure control, iterative reconstruction, and/or weight based adjustment of the mA/kV was utilized to reduce the radiation dose to as low as reasonably achievable. COMPARISON: None available. CLINICAL HISTORY: Fall, head injury, brain fog. FINDINGS: BRAIN AND VENTRICLES: No acute hemorrhage. No evidence of acute infarct. No hydrocephalus. No extra-axial collection. No mass effect or midline shift. ORBITS: No acute abnormality. SINUSES: No acute abnormality. SOFT TISSUES AND SKULL: No acute soft tissue abnormality. No skull fracture. IMPRESSION: 1. No acute intracranial abnormality. Electronically signed by: Lonni Necessary MD 02/09/2024 05:10 PM EDT RP Workstation: [id number]    Microbiology: Results for orders placed or performed during the hospital encounter of 12/30/23  Resp panel by RT-PCR (RSV, Flu A&B, Covid) Anterior Nasal Swab     Status: None   Collection Time: 12/30/23 10:36 PM   Specimen: Anterior Nasal Swab  Result Value Ref Range Status   SARS Coronavirus 2 by RT PCR NEGATIVE NEGATIVE Final   Influenza A by PCR NEGATIVE NEGATIVE Final   Influenza B by PCR  NEGATIVE NEGATIVE Final    Comment: (NOTE) The Xpert Xpress SARS-CoV-2/FLU/RSV plus assay is intended as an aid in the diagnosis of influenza from Nasopharyngeal swab specimens and should not be used as a sole basis for treatment. Nasal washings and aspirates are unacceptable for Xpert Xpress SARS-CoV-2/FLU/RSV testing.  Fact Sheet for Patients: bloggercourse.com  Fact Sheet for Healthcare Providers: seriousbroker.it  This test is not yet approved or cleared by the [place]  FDA and has been authorized for detection and/or diagnosis of SARS-CoV-2 by FDA under an Emergency Use Authorization (EUA). This EUA will remain in effect (meaning this test can be used) for the duration of the COVID-19 declaration under Section 564(b)(1) of the Act, 21 U.S.C. section 360bbb-3(b)(1), unless the authorization is terminated or revoked.     Resp Syncytial Virus by PCR NEGATIVE NEGATIVE Final    Comment: (NOTE) Fact Sheet for Patients: bloggercourse.com  Fact Sheet for Healthcare Providers: seriousbroker.it  This test is not yet approved or cleared by the [place]  FDA and has been authorized for detection and/or diagnosis of SARS-CoV-2 by FDA under an Emergency Use Authorization (EUA). This EUA will remain in effect (meaning this test can be used) for the duration of the COVID-19 declaration under Section 564(b)(1) of the Act, 21 U.S.C. section 360bbb-3(b)(1), unless the authorization is  terminated or revoked.  Performed at [facility], [address], [place], [place] [address]     Labs: CBC: Recent Labs  Lab 02/09/24 1510 02/10/24 0410 02/11/24 0615 02/12/24 0513 02/13/24 0339  WBC 8.4 5.9 4.8 5.0 5.6  NEUTROABS 5.4  --  2.6 2.7 3.2  HGB 14.4 10.4* 10.0* 10.3* 10.5*  HCT 43.5 31.6* 31.1* 31.9* 32.6*  MCV 84.5 85.9 86.9 86.7 87.4  PLT 202 140* 137* 153 169    Basic Metabolic Panel: Recent Labs  Lab 02/09/24 1510 02/10/24 0410 02/11/24 0615 02/12/24 0513 02/13/24 0339  NA 132* 138 138 137 135  K 3.3* 3.8 3.3* 4.0 3.9  CL 91* 100 102 101 101  CO2 24 23 27 28 26   GLUCOSE 110* 103* 100* 107* 102*  BUN 8 9 10 9 12   CREATININE 1.11* 0.88 0.98 1.09* 1.03*  CALCIUM 10.1 8.3* 8.4* 8.7* 8.8*  MG 1.5* 1.5*  --   --   --   PHOS 2.7 3.2 3.0  --   --    Liver Function Tests: Recent Labs  Lab 02/09/24 1510  AST 128*  ALT 68*  ALKPHOS 89  BILITOT 2.0*  PROT 8.3*  ALBUMIN 4.4   CBG: No results for input(s): GLUCAP in the last 168 hours.  Discharge time spent: greater than 30 minutes.  Signed: Brando Taves, DO [facility] 02/14/2024

## 2024-02-15 ENCOUNTER — Telehealth (HOSPITAL_COMMUNITY): Payer: MEDICAID | Admitting: Psychiatry

## 2024-02-15 ENCOUNTER — Telehealth (HOSPITAL_COMMUNITY): Payer: Self-pay | Admitting: Licensed Clinical Social Worker

## 2024-02-15 ENCOUNTER — Encounter (HOSPITAL_COMMUNITY): Payer: Self-pay | Admitting: Psychiatry

## 2024-02-15 VITALS — Wt 167.0 lb

## 2024-02-15 DIAGNOSIS — F322 Major depressive disorder, single episode, severe without psychotic features: Secondary | ICD-10-CM

## 2024-02-15 DIAGNOSIS — F5105 Insomnia due to other mental disorder: Secondary | ICD-10-CM | POA: Diagnosis not present

## 2024-02-15 DIAGNOSIS — F4312 Post-traumatic stress disorder, chronic: Secondary | ICD-10-CM | POA: Diagnosis not present

## 2024-02-15 DIAGNOSIS — F10188 Alcohol abuse with other alcohol-induced disorder: Secondary | ICD-10-CM | POA: Diagnosis not present

## 2024-02-15 DIAGNOSIS — F99 Mental disorder, not otherwise specified: Secondary | ICD-10-CM

## 2024-02-15 MED ORDER — FLUOXETINE HCL 20 MG PO CAPS: 20.0000 mg | ORAL_CAPSULE | Freq: Every day | ORAL | 0 refills | Status: DC

## 2024-02-15 MED ORDER — TRAZODONE HCL 50 MG PO TABS: 25.0000 mg | ORAL_TABLET | Freq: Every evening | ORAL | 0 refills | Status: AC | PRN

## 2024-02-15 MED ORDER — NALTREXONE HCL 50 MG PO TABS: 50.0000 mg | ORAL_TABLET | Freq: Every day | ORAL | 0 refills | Status: AC

## 2024-02-15 NOTE — Progress Notes (Signed)
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   Platelets 93 (L) 150 - 400 K/uL    Comment: REPEATED TO VERIFY Immature Platelet Fraction may be clinically indicated, consider ordering this additional test [id number]    nRBC 0.0 0.0 - 0.2 %    Neutrophils Relative % 66 %   Neutro Abs 2.9 1.7 - 7.7 K/uL   Lymphocytes Relative 26 %   Lymphs Abs 1.1 0.7 - 4.0 K/uL   Monocytes Relative 6 %   Monocytes Absolute 0.2 0.1 - 1.0 K/uL   Eosinophils Relative 0 %   Eosinophils Absolute 0.0 0.0 - 0.5 K/uL   Basophils Relative 1 %   Basophils Absolute 0.0 0.0 - 0.1 K/uL   Immature Granulocytes 1 %   Abs Immature Granulocytes 0.02 0.00 - 0.07 K/uL    Comment: Performed at [facility], [address], [place], [place] [address]  I-STAT, nathanael 8     Status: Abnormal   Collection Time: 01/02/24  2:01 PM  Result Value Ref Range   Sodium 139 135 - 145 mmol/L   Potassium 3.7 3.5 - 5.1 mmol/L   Chloride 104 98 - 111 mmol/L   BUN <3 (L) 6 - 20 mg/dL   Creatinine, Ser 9.19 0.44 - 1.00 mg/dL   Glucose, Bld 892 (H) 70 - 99 mg/dL    Comment: Glucose reference range applies only to samples taken after fasting for at least 8 hours.   Calcium, Ion 1.15 1.15 - 1.40 mmol/L   TCO2 24 22 - 32 mmol/L   Hemoglobin 12.2 12.0 - 15.0 g/dL   HCT 63.9 63.9 - 53.9 %  Surgical pathology     Status: None   Collection Time: 01/02/24  2:39 PM  Result Value Ref Range   SURGICAL PATHOLOGY      SURGICAL PATHOLOGY CASE: [phone] PATIENT: Joyel Weinand Surgical Pathology Report     Clinical History: nausea, vomiting, chest pain, odynophagia, R/O malignancy, R/O H. Pylori (cm)     FINAL MICROSCOPIC DIAGNOSIS:  A. ESOPHAGUS, BIOPSY:      No tissue survived from the process.  B. STOMACH, BIOPSY:      Gastric antral mucosal with reactive epithelial changes.      No H. pylori identified on HE stain.  Negative for intestinal metaplasia or dysplasia.   GROSS DESCRIPTION:  A. Received in formalin labeled with the patients name and Esophageal bx is a scant amount of minute, tan soft tissue flecks. Submitted in toto in a single cassette.  B. Received in formalin labeled with the patients name and Gastric bx are four 0.2-0.3 cm pieces  of tan soft tissue, submitted in toto in a single cassette.  (LEF 01/03/2024)  Final Diagnosis performed by Pepper Dutton, MD.   Electronically signed 01/04/2024 Technical and / or Professional components perform ed at [facility]. [facility], [address] [place], [place] [address].  Immunohistochemistry Technical component (if applicable) was performed at [facility]. [address], [place], [place] [address].   IMMUNOHISTOCHEMISTRY DISCLAIMER (if applicable): Some of these immunohistochemical stains may have been developed and the performance characteristics determine by [facility]. Some may not have been cleared or approved by the U.S. Food and Drug Administration. The FDA has determined that such clearance or approval is not necessary. This test is used for clinical purposes. It should not be regarded as investigational or for research. This laboratory is certified under the Clinical Laboratory Improvement Amendments of 1988 (CLIA-88) as qualified to perform high complexity clinical laboratory testing.  The controls stained appropriately.   IHC stains are performed on formalin fixed, paraffin embedded tissue using a 3,3diam inobenzidine (DAB) chromogen and Leica Bond Autostainer System. The staining intensity of the nucleus is score manually and is reported as the percentage of tumor cell nuclei demonstrating specific nuclear staining. The specimens are fixed in 10% Neutral Formalin for at least 6 hours and up to 72hrs. These tests are validated on decalcified tissue. Results should be interpreted with caution given the possibility of false negative results on decalcified specimens. Antibody Clones are as follows ER-clone 29F, PR-clone 16, Ki67- clone MM1. Some of these immunohistochemical stains may have been developed and the performance characteristics determined by [facility].   Phosphorus     Status: Abnormal    Collection Time: 01/03/24  2:28 AM  Result Value Ref Range   Phosphorus 2.3 (L) 2.5 - 4.6 mg/dL    Comment: Performed at [facility], [address], [place], [place] [address]  Comprehensive metabolic panel with GFR     Status: Abnormal   Collection Time: 01/03/24  2:28 AM  Result Value Ref Range   Sodium 135 135 - 145 mmol/L   Potassium 3.4 (L) 3.5 - 5.1 mmol/L   Chloride 100 98 - 111 mmol/L   CO2 25 22 - 32 mmol/L   Glucose, Bld 97 70 - 99 mg/dL    Comment: Glucose reference range applies only to samples taken after fasting for at least 8 hours.   BUN <5 (L) 6 - 20 mg/dL   Creatinine, Ser 9.11 0.44 - 1.00 mg/dL   Calcium 8.6 (L) 8.9 - 10.3 mg/dL   Total Protein 6.0 (L) 6.5 - 8.1 g/dL   Albumin 3.2 (L) 3.5 - 5.0 g/dL   AST 73 (H) 15 - 41 U/L   ALT 41 0 - 44 U/L   Alkaline Phosphatase 47 38 - 126 U/L   Total Bilirubin 0.8 0.0 - 1.2 mg/dL   GFR, Estimated >39 >39 mL/min    Comment: (NOTE) Calculated using the CKD-EPI Creatinine Equation (2021)    Anion gap 10 5 - 15    Comment: Performed at [facility], [address] [place], [place] [address]  Magnesium      Status: None   Collection Time: 01/03/24  2:28 AM  Result Value Ref Range   Magnesium  1.9 1.7 - 2.4 mg/dL    Comment: Performed at [facility], [address] [place], [place] [address]  CBC with Differential/Platelet     Status: Abnormal   Collection Time: 01/03/24  2:28 AM  Result Value Ref Range   WBC 9.0 4.0 - 10.5 K/uL   RBC 3.54 (L) 3.87 - 5.11 MIL/uL   Hemoglobin 9.9 (L) 12.0 - 15.0 g/dL   HCT 69.3 (L) 63.9 - 53.9 %   MCV 86.4 80.0 - 100.0 fL   MCH 28.0 26.0 - 34.0 pg   MCHC 32.4 30.0 - 36.0 g/dL   RDW 85.7 88.4 - 84.4 %   Platelets 106 (L) 150 - 400 K/uL   nRBC 0.0 0.0 - 0.2 %   Neutrophils Relative % 78 %   Neutro Abs 7.0 1.7 - 7.7 K/uL   Lymphocytes Relative 16 %   Lymphs Abs 1.4 0.7 - 4.0 K/uL   Monocytes Relative 6 %   Monocytes Absolute 0.5 0.1 - 1.0 K/uL   Eosinophils  Relative 0 %   Eosinophils Absolute 0.0 0.0 - 0.5 K/uL   Basophils Relative 0 %   Basophils Absolute 0.0 0.0 - 0.1 K/uL   Immature Granulocytes 0 %   Abs Immature Granulocytes 0.03 0.00 - 0.07 K/uL    Comment: Performed at [facility], [address], [place], [place] [address]  Phosphorus     Status: None   Collection Time: 01/04/24  6:53 AM  Result Value Ref Range   Phosphorus 3.2 2.5 - 4.6 mg/dL    Comment: Performed at [facility], [address], [place], [place] [address]  Comprehensive metabolic panel with GFR     Status: Abnormal   Collection Time: 01/04/24  6:53 AM  Result Value Ref Range   Sodium 138 135 - 145 mmol/L   Potassium 3.3 (L) 3.5 - 5.1 mmol/L   Chloride 104 98 - 111 mmol/L   CO2 26 22 - 32 mmol/L   Glucose, Bld 100 (H) 70 - 99 mg/dL    Comment: Glucose reference range applies only to samples taken after fasting for at least 8 hours.   BUN <5 (L) 6 - 20 mg/dL   Creatinine, Ser 9.09 0.44 - 1.00 mg/dL   Calcium 8.8 (L) 8.9 - 10.3 mg/dL   Total Protein 5.9 (L) 6.5 - 8.1 g/dL   Albumin 3.1 (L) 3.5 - 5.0 g/dL   AST 57 (H) 15 - 41 U/L   ALT 41 0 - 44 U/L   Alkaline Phosphatase 46 38 - 126 U/L   Total Bilirubin 0.5 0.0 - 1.2 mg/dL   GFR, Estimated >39 >39 mL/min    Comment: (NOTE) Calculated using the CKD-EPI Creatinine Equation (2021)    Anion gap 8 5 - 15    Comment: Performed at [facility], [address] [place], [place] [address]  Magnesium      Status: None   Collection Time: 01/04/24  6:53 AM  Result Value Ref Range   Magnesium  1.9 1.7 - 2.4 mg/dL    Comment: Performed at [facility], [address] [place], [place] [address]  CBC with Differential/Platelet     Status: Abnormal   Collection Time: 01/04/24  6:53 AM  Result Value Ref Range   WBC 5.7 4.0 - 10.5 K/uL  RBC 3.61 (L) 3.87 - 5.11 MIL/uL   Hemoglobin 10.2 (L) 12.0 - 15.0 g/dL   HCT 68.7 (L) 63.9 - 53.9 %   MCV 86.4 80.0 - 100.0 fL   MCH 28.3 26.0 - 34.0 pg    MCHC 32.7 30.0 - 36.0 g/dL   RDW 85.4 88.4 - 84.4 %   Platelets 140 (L) 150 - 400 K/uL   nRBC 0.0 0.0 - 0.2 %   Neutrophils Relative % 63 %   Neutro Abs 3.6 1.7 - 7.7 K/uL   Lymphocytes Relative 24 %   Lymphs Abs 1.4 0.7 - 4.0 K/uL   Monocytes Relative 11 %   Monocytes Absolute 0.6 0.1 - 1.0 K/uL   Eosinophils Relative 1 %   Eosinophils Absolute 0.1 0.0 - 0.5 K/uL   Basophils Relative 0 %   Basophils Absolute 0.0 0.0 - 0.1 K/uL   Immature Granulocytes 1 %   Abs Immature Granulocytes 0.03 0.00 - 0.07 K/uL    Comment: Performed at [facility], [address] [place], [place] [address]  CBC with Differential/Platelet     Status: Abnormal   Collection Time: 01/10/24 10:14 AM  Result Value Ref Range   WBC 5.2 4.0 - 10.5 K/uL   RBC 3.81 (L) 3.87 - 5.11 Mil/uL   Hemoglobin 10.8 (L) 12.0 - 15.0 g/dL   HCT 66.5 (L) 63.9 - 53.9 %   MCV 87.6 78.0 - 100.0 fl   MCHC 32.4 30.0 - 36.0 g/dL   RDW 84.8 88.4 - 84.4 %   Platelets 390.0 150.0 - 400.0 K/uL   Neutrophils Relative % 59.7 43.0 - 77.0 %   Lymphocytes Relative 24.6 12.0 - 46.0 %   Monocytes Relative 14.3 (H) 3.0 - 12.0 %   Eosinophils Relative 0.6 0.0 - 5.0 %   Basophils Relative 0.8 0.0 - 3.0 %   Neutro Abs 3.1 1.4 - 7.7 K/uL   Lymphs Abs 1.3 0.7 - 4.0 K/uL   Monocytes Absolute 0.7 0.1 - 1.0 K/uL   Eosinophils Absolute 0.0 0.0 - 0.7 K/uL   Basophils Absolute 0.0 0.0 - 0.1 K/uL  Comprehensive metabolic panel with GFR     Status: Abnormal   Collection Time: 01/10/24 10:14 AM  Result Value Ref Range   Sodium 140 135 - 145 mEq/L   Potassium 3.5 3.5 - 5.1 mEq/L   Chloride 104 96 - 112 mEq/L   CO2 28 19 - 32 mEq/L   Glucose, Bld 93 70 - 99 mg/dL   BUN 5 (L) 6 - 23 mg/dL   Creatinine, Ser 9.07 0.40 - 1.20 mg/dL   Total Bilirubin 0.4 0.2 - 1.2 mg/dL   Alkaline Phosphatase 45 39 - 117 U/L   AST 49 (H) 0 - 37 U/L   ALT 64 (H) 0 - 35 U/L   Total Protein 6.8 6.0 - 8.3 g/dL   Albumin 4.0 3.5 - 5.2 g/dL   GFR 26.12 >39.99  mL/min    Comment: Calculated using the CKD-EPI Creatinine Equation (2021)   Calcium 9.7 8.4 - 10.5 mg/dL  CBC with Differential     Status: Abnormal   Collection Time: 02/09/24  3:10 PM  Result Value Ref Range   WBC 8.4 4.0 - 10.5 K/uL   RBC 5.15 (H) 3.87 - 5.11 MIL/uL   Hemoglobin 14.4 12.0 - 15.0 g/dL   HCT 56.4 63.9 - 53.9 %   MCV 84.5 80.0 - 100.0 fL   MCH 28.0 26.0 - 34.0 pg  MCHC 33.1 30.0 - 36.0 g/dL   RDW 86.5 88.4 - 84.4 %   Platelets 202 150 - 400 K/uL   nRBC 0.0 0.0 - 0.2 %   Neutrophils Relative % 63 %   Neutro Abs 5.4 1.7 - 7.7 K/uL   Lymphocytes Relative 28 %   Lymphs Abs 2.3 0.7 - 4.0 K/uL   Monocytes Relative 7 %   Monocytes Absolute 0.6 0.1 - 1.0 K/uL   Eosinophils Relative 0 %   Eosinophils Absolute 0.0 0.0 - 0.5 K/uL   Basophils Relative 1 %   Basophils Absolute 0.1 0.0 - 0.1 K/uL   Immature Granulocytes 1 %   Abs Immature Granulocytes 0.05 0.00 - 0.07 K/uL    Comment: Performed at [facility], [address] [place], [place] [address]  Comprehensive metabolic panel     Status: Abnormal   Collection Time: 02/09/24  3:10 PM  Result Value Ref Range   Sodium 132 (L) 135 - 145 mmol/L   Potassium 3.3 (L) 3.5 - 5.1 mmol/L   Chloride 91 (L) 98 - 111 mmol/L   CO2 24 22 - 32 mmol/L   Glucose, Bld 110 (H) 70 - 99 mg/dL    Comment: Glucose reference range applies only to samples taken after fasting for at least 8 hours.   BUN 8 6 - 20 mg/dL   Creatinine, Ser 8.88 (H) 0.44 - 1.00 mg/dL   Calcium 89.8 8.9 - 89.6 mg/dL   Total Protein 8.3 (H) 6.5 - 8.1 g/dL   Albumin 4.4 3.5 - 5.0 g/dL   AST 871 (H) 15 - 41 U/L   ALT 68 (H) 0 - 44 U/L   Alkaline Phosphatase 89 38 - 126 U/L   Total Bilirubin 2.0 (H) 0.0 - 1.2 mg/dL   GFR, Estimated >39 >39 mL/min    Comment: (NOTE) Calculated using the CKD-EPI Creatinine Equation (2021)    Anion gap 17 (H) 5 - 15    Comment: Performed at [facility], [address] [place], [place] [address]  Ethanol      Status: None   Collection Time: 02/09/24  3:10 PM  Result Value Ref Range   Alcohol, Ethyl (B) <15 <15 mg/dL    Comment: (NOTE) For medical purposes only. Performed at [facility], [address], [place], [place] [address]   Lipase, blood     Status: None   Collection Time: 02/09/24  3:10 PM  Result Value Ref Range   Lipase 38 11 - 51 U/L    Comment: Performed at [facility], [address] [place], [place] [address]  Magnesium      Status: Abnormal   Collection Time: 02/09/24  3:10 PM  Result Value Ref Range   Magnesium  1.5 (L) 1.7 - 2.4 mg/dL    Comment: Performed at [facility], [address], [place], [place] [address]  Phosphorus     Status: None   Collection Time: 02/09/24  3:10 PM  Result Value Ref Range   Phosphorus 2.7 2.5 - 4.6 mg/dL    Comment: Performed at [facility], [address], [place], [place] [address]  Urinalysis, Routine w reflex microscopic -Urine, Clean Catch     Status: Abnormal   Collection Time: 02/09/24 11:02 PM  Result Value Ref Range   Color, Urine YELLOW YELLOW   APPearance CLEAR CLEAR   Specific Gravity, Urine >1.046 (H) 1.005 - 1.030   pH 6.0 5.0 -  8.0   Glucose, UA NEGATIVE NEGATIVE mg/dL   Hgb urine dipstick SMALL (A) NEGATIVE   Bilirubin Urine NEGATIVE NEGATIVE   Ketones, ur NEGATIVE NEGATIVE mg/dL   Protein, ur NEGATIVE NEGATIVE mg/dL   Nitrite NEGATIVE NEGATIVE   Leukocytes,Ua NEGATIVE NEGATIVE   RBC / HPF 0-5 0 - 5 RBC/hpf   WBC, UA 0-5 0 - 5 WBC/hpf   Bacteria, UA NONE SEEN NONE SEEN   Squamous Epithelial / HPF 6-10 0 - 5 /HPF    Comment: Performed at [facility], [address] [place], [place] [address]  Rapid urine drug screen (hospital performed)     Status: Abnormal   Collection Time: 02/09/24 11:02 PM  Result Value Ref Range   Opiates NONE DETECTED NONE DETECTED   Cocaine NONE DETECTED NONE DETECTED   Benzodiazepines POSITIVE (A) NONE DETECTED   Amphetamines NONE DETECTED NONE  DETECTED   Tetrahydrocannabinol POSITIVE (A) NONE DETECTED   Barbiturates NONE DETECTED NONE DETECTED    Comment: (NOTE) DRUG SCREEN FOR MEDICAL PURPOSES ONLY.  IF CONFIRMATION IS NEEDED FOR ANY PURPOSE, NOTIFY LAB WITHIN 5 DAYS.  LOWEST DETECTABLE LIMITS FOR URINE DRUG SCREEN Drug Class                     Cutoff (ng/mL) Amphetamine and metabolites    1000 Barbiturate and metabolites    200 Benzodiazepine                 200 Opiates and metabolites        300 Cocaine and metabolites        300 THC                            50 Performed at [facility], [address], [place], [place] [address]   Pregnancy, urine     Status: None   Collection Time: 02/09/24 11:02 PM  Result Value Ref Range   Preg Test, Ur NEGATIVE NEGATIVE    Comment:        THE SENSITIVITY OF THIS METHODOLOGY IS >20 mIU/mL. Performed at [facility], [address], [place], [place] [address]   Basic metabolic panel with GFR     Status: Abnormal   Collection Time: 02/10/24  4:10 AM  Result Value Ref Range   Sodium 138 135 - 145 mmol/L   Potassium 3.8 3.5 - 5.1 mmol/L   Chloride 100 98 - 111 mmol/L   CO2 23 22 - 32 mmol/L   Glucose, Bld 103 (H) 70 - 99 mg/dL    Comment: Glucose reference range applies only to samples taken after fasting for at least 8 hours.   BUN 9 6 - 20 mg/dL   Creatinine, Ser 9.11 0.44 - 1.00 mg/dL   Calcium 8.3 (L) 8.9 - 10.3 mg/dL   GFR, Estimated >39 >39 mL/min    Comment: (NOTE) Calculated using the CKD-EPI Creatinine Equation (2021)    Anion gap 15 5 - 15    Comment: Performed at [facility], [address], [place], [place] [address]  CBC     Status: Abnormal   Collection Time: 02/10/24  4:10 AM  Result Value Ref Range   WBC 5.9 4.0 - 10.5 K/uL   RBC 3.68 (L) 3.87 - 5.11 MIL/uL   Hemoglobin 10.4 (L) 12.0 - 15.0 g/dL    Comment: REPEATED TO VERIFY   HCT 31.6 (L) 36.0 -  46.0 %   MCV 85.9 80.0 - 100.0 fL   MCH 28.3 26.0 - 34.0 pg   MCHC 32.9 30.0 -  36.0 g/dL   RDW 86.1 88.4 - 84.4 %   Platelets 140 (L) 150 - 400 K/uL    Comment: SPECIMEN CHECKED FOR CLOTS REPEATED TO VERIFY    nRBC 0.0 0.0 - 0.2 %    Comment: Performed at [facility], [address] [place], [place] [address]  Magnesium      Status: Abnormal   Collection Time: 02/10/24  4:10 AM  Result Value Ref Range   Magnesium  1.5 (L) 1.7 - 2.4 mg/dL    Comment: Performed at [facility], [address] [place], [place] [address]  Phosphorus     Status: None   Collection Time: 02/10/24  4:10 AM  Result Value Ref Range   Phosphorus 3.2 2.5 - 4.6 mg/dL    Comment: Performed at [facility], [address], [place], [place] [address]  TSH     Status: None   Collection Time: 02/10/24  4:10 AM  Result Value Ref Range   TSH 3.196 0.350 - 4.500 uIU/mL    Comment: Performed by a 3rd Generation assay with a functional sensitivity of <=0.01 uIU/mL. Performed at [facility], [address], [place], [place] [address]   Phosphorus     Status: None   Collection Time: 02/11/24  6:15 AM  Result Value Ref Range   Phosphorus 3.0 2.5 - 4.6 mg/dL    Comment: Performed at [facility], [address], [place], [place] [address]  CBC with Differential/Platelet     Status: Abnormal   Collection Time: 02/11/24  6:15 AM  Result Value Ref Range   WBC 4.8 4.0 - 10.5 K/uL   RBC 3.58 (L) 3.87 - 5.11 MIL/uL   Hemoglobin 10.0 (L) 12.0 - 15.0 g/dL   HCT 68.8 (L) 63.9 - 53.9 %   MCV 86.9 80.0 - 100.0 fL   MCH 27.9 26.0 - 34.0 pg   MCHC 32.2 30.0 - 36.0 g/dL   RDW 85.8 88.4 - 84.4 %   Platelets 137 (L) 150 - 400 K/uL   nRBC 0.0 0.0 - 0.2 %   Neutrophils Relative % 54 %   Neutro Abs 2.6 1.7 - 7.7 K/uL   Lymphocytes Relative 38 %   Lymphs Abs 1.8 0.7 - 4.0 K/uL   Monocytes Relative 7 %   Monocytes Absolute 0.4 0.1 - 1.0 K/uL   Eosinophils Relative 0 %   Eosinophils Absolute 0.0 0.0 - 0.5 K/uL   Basophils Relative 0 %   Basophils Absolute 0.0 0.0 - 0.1 K/uL    Immature Granulocytes 1 %   Abs Immature Granulocytes 0.03 0.00 - 0.07 K/uL    Comment: Performed at [facility], [address] [place], [place] [address]  Basic metabolic panel with GFR     Status: Abnormal   Collection Time: 02/11/24  6:15 AM  Result Value Ref Range   Sodium 138 135 - 145 mmol/L   Potassium 3.3 (L) 3.5 - 5.1 mmol/L   Chloride 102 98 - 111 mmol/L   CO2 27 22 - 32 mmol/L   Glucose, Bld 100 (H) 70 - 99 mg/dL    Comment: Glucose reference range applies only to samples taken after fasting for at least 8 hours.   BUN 10 6 - 20 mg/dL   Creatinine, Ser 9.01 0.44 - 1.00  mg/dL   Calcium 8.4 (L) 8.9 - 10.3 mg/dL   GFR, Estimated >39 >39 mL/min    Comment: (NOTE) Calculated using the CKD-EPI Creatinine Equation (2021)    Anion gap 9 5 - 15    Comment: Performed at [facility], [address], [place], [place] [address]  CBC with Differential/Platelet     Status: Abnormal   Collection Time: 02/12/24  5:13 AM  Result Value Ref Range   WBC 5.0 4.0 - 10.5 K/uL   RBC 3.68 (L) 3.87 - 5.11 MIL/uL   Hemoglobin 10.3 (L) 12.0 - 15.0 g/dL   HCT 68.0 (L) 63.9 - 53.9 %   MCV 86.7 80.0 - 100.0 fL   MCH 28.0 26.0 - 34.0 pg   MCHC 32.3 30.0 - 36.0 g/dL   RDW 85.7 88.4 - 84.4 %   Platelets 153 150 - 400 K/uL   nRBC 0.0 0.0 - 0.2 %   Neutrophils Relative % 54 %   Neutro Abs 2.7 1.7 - 7.7 K/uL   Lymphocytes Relative 38 %   Lymphs Abs 1.9 0.7 - 4.0 K/uL   Monocytes Relative 7 %   Monocytes Absolute 0.3 0.1 - 1.0 K/uL   Eosinophils Relative 1 %   Eosinophils Absolute 0.0 0.0 - 0.5 K/uL   Basophils Relative 0 %   Basophils Absolute 0.0 0.0 - 0.1 K/uL   Immature Granulocytes 0 %   Abs Immature Granulocytes 0.01 0.00 - 0.07 K/uL    Comment: Performed at [facility], [address], [place], [place] [address]  Basic metabolic panel with GFR     Status: Abnormal   Collection Time: 02/12/24  5:13 AM  Result Value Ref Range   Sodium 137 135 - 145 mmol/L    Potassium 4.0 3.5 - 5.1 mmol/L   Chloride 101 98 - 111 mmol/L   CO2 28 22 - 32 mmol/L   Glucose, Bld 107 (H) 70 - 99 mg/dL    Comment: Glucose reference range applies only to samples taken after fasting for at least 8 hours.   BUN 9 6 - 20 mg/dL   Creatinine, Ser 8.90 (H) 0.44 - 1.00 mg/dL   Calcium 8.7 (L) 8.9 - 10.3 mg/dL   GFR, Estimated >39 >39 mL/min    Comment: (NOTE) Calculated using the CKD-EPI Creatinine Equation (2021)    Anion gap 8 5 - 15    Comment: Performed at [facility], [address], [place], [place] [address]  CBC with Differential/Platelet     Status: Abnormal   Collection Time: 02/13/24  3:39 AM  Result Value Ref Range   WBC 5.6 4.0 - 10.5 K/uL   RBC 3.73 (L) 3.87 - 5.11 MIL/uL   Hemoglobin 10.5 (L) 12.0 - 15.0 g/dL   HCT 67.3 (L) 63.9 - 53.9 %   MCV 87.4 80.0 - 100.0 fL   MCH 28.2 26.0 - 34.0 pg   MCHC 32.2 30.0 - 36.0 g/dL   RDW 85.3 88.4 - 84.4 %   Platelets 169 150 - 400 K/uL   nRBC 0.0 0.0 - 0.2 %   Neutrophils Relative % 57 %   Neutro Abs 3.2 1.7 - 7.7 K/uL   Lymphocytes Relative 33 %   Lymphs Abs 1.9 0.7 - 4.0 K/uL   Monocytes Relative 9 %   Monocytes Absolute 0.5 0.1 - 1.0 K/uL   Eosinophils Relative 1 %   Eosinophils Absolute 0.0 0.0 - 0.5 K/uL   Basophils Relative  0 %   Basophils Absolute 0.0 0.0 - 0.1 K/uL   Immature Granulocytes 0 %   Abs Immature Granulocytes 0.02 0.00 - 0.07 K/uL    Comment: Performed at [facility], [address] [place], [place] [address]  Basic metabolic panel with GFR     Status: Abnormal   Collection Time: 02/13/24  3:39 AM  Result Value Ref Range   Sodium 135 135 - 145 mmol/L   Potassium 3.9 3.5 - 5.1 mmol/L   Chloride 101 98 - 111 mmol/L   CO2 26 22 - 32 mmol/L   Glucose, Bld 102 (H) 70 - 99 mg/dL    Comment: Glucose reference range applies only to samples taken after fasting for at least 8 hours.   BUN 12 6 - 20 mg/dL   Creatinine, Ser 8.96 (H) 0.44 - 1.00 mg/dL   Calcium 8.8 (L) 8.9 -  10.3 mg/dL   GFR, Estimated >39 >39 mL/min    Comment: (NOTE) Calculated using the CKD-EPI Creatinine Equation (2021)    Anion gap 8 5 - 15    Comment: Performed at [facility], [address] [place], [place] [address]     Psychiatric Specialty Exam: Physical Exam  Review of Systems  Psychiatric/Behavioral:  Positive for sleep disturbance. The patient is nervous/anxious.     Weight 167 lb (75.8 kg), last menstrual period 06/19/2013.There is no height or weight on file to calculate BMI.  General Appearance: Casual  Eye Contact:  Fair  Speech:  Slow  Volume:  Normal  Mood:  Anxious and Dysphoric  Affect:  Congruent  Thought Process:  Goal Directed  Orientation:  Full (Time, Place, and Person)  Thought Content:  Rumination  Suicidal Thoughts:  No  Homicidal Thoughts:  No  Memory:  Immediate;   Good Recent;   Good Remote;   Good  Judgement:  Fair  Insight:  Shallow  Psychomotor Activity:  Decreased  Concentration:  Concentration: Fair and Attention Span: Fair  Recall:  Good  Fund of Knowledge:  Good  Language:  Good  Akathisia:  No  Handed:  Right  AIMS (if indicated):     Assets:  Communication Skills Desire for Improvement Housing Social Support Transportation  ADL's:  Intact  Cognition:  WNL  Sleep:  fair to poor       08/07/2023   10:44 AM 06/28/2023   11:05 AM 04/09/2023    1:45 PM 03/08/2023    2:18 PM 03/08/2023    1:09 PM  Depression screen PHQ 2/9  Decreased Interest 0 1 1 2 2   Down, Depressed, Hopeless 0 1 1 2 2   PHQ - 2 Score 0 2 2 4 4   Altered sleeping  0 1 2 2   Tired, decreased energy  0 1 2 2   Change in appetite  1 0 2 2  Feeling bad or failure about yourself   0 1 1 1   Trouble concentrating  1 0 1 1  Moving slowly or fidgety/restless  0 0 2 2  Suicidal thoughts  0 0 1 1  PHQ-9 Score  4 5 15 15   Difficult doing work/chores  Not difficult at all Somewhat difficult Somewhat difficult Somewhat difficult    Assessment/Plan: Alcohol  abuse with other alcohol-induced disorder (HCC) - Plan: FLUoxetine  (PROZAC ) 20 MG capsule, naltrexone  (DEPADE) 50 MG tablet, traZODone  (DESYREL ) 50 MG tablet  Chronic post-traumatic stress disorder (PTSD) - Plan: FLUoxetine  (PROZAC ) 20 MG capsule, naltrexone  (DEPADE) 50 MG tablet, traZODone  (  DESYREL ) 50 MG tablet  Major depressive disorder, single episode, severe without psychotic features (HCC) - Plan: FLUoxetine  (PROZAC ) 20 MG capsule, naltrexone  (DEPADE) 50 MG tablet  Insomnia due to other mental disorder - Plan: traZODone  (DESYREL ) 50 MG tablet  Patient is a 48 year old African-American self-employed female with significant history of alcohol use, major depressive disorder, PTSD.  Review collateral information and notes from recent hospitalization including blood work results, current medication.  I had a long discussion with the patient about her drinking.  She is showing poor judgment with shallow insight into her illness.  She had made commitments that she will take the medicine and start the AA meeting and she can stop drinking on her own but has not able to do it.  I emphasized that she needs a program and resources to work on it.  She talk about Antabuse however given the higher level of liver enzymes I will not start Antabuse.  I also discussed that she need to be committed with not drinking as Antabuse may give you severe sickness if she relapse into drinking or even uses anything that has alcohol.  After some discussion patient agree to do CDIOP program.  In the meantime she will continue naltrexone  50 mg but will take regularly.  Her liver enzymes still 3 times high, continue Prozac  20 mg daily and trazodone  25 mg as needed for insomnia.  I will refer her to [facility] IOP.  Follow-up once she finished the program.   Follow Up Instructions:     I discussed the assessment and treatment plan with the patient. The patient was provided an opportunity to ask questions and all were answered. The  patient agreed with the plan and demonstrated an understanding of the instructions.   The patient was advised to call back or seek an in-person evaluation if the symptoms worsen or if the condition fails to improve as anticipated.    Collaboration of Care: Other provider involved in patient's care AEB notes are available in epic to review  Patient/Guardian was advised Release of Information must be obtained prior to any record release in order to collaborate their care with an outside provider. Patient/Guardian was advised if they have not already done so to contact the registration department to sign all necessary forms in order for us  to release information regarding their care.   Consent: Patient/Guardian gives verbal consent for treatment and assignment of benefits for services provided during this visit. Patient/Guardian expressed understanding and agreed to proceed.     Total encounter time 25 minutes which includes face-to-face time, chart reviewed, care coordination, order entry and documentation during this encounter.   Note: This document was prepared by [employer] voice dictation technology and any errors that results from this process are unintentional.    Leni ONEIDA Client, MD 02/15/2024

## 2024-02-15 NOTE — Telephone Encounter (Signed)
 The therapist calls Danielle Reilly confirming her identity via two identifiers after being asked to reach out to her by her psychiatrist, Dr. Curry. The therapist answers her questions about [facility] IOP and schedules her to come for an assessment on 02/18/24 at 2:30 p.m.  Zell Maier, MA, LCSW, [facility], LCAS 02/15/2024

## 2024-02-15 NOTE — Transitions of Care (Post Inpatient/ED Visit) (Signed)
 02/15/2024  Name: Danielle Reilly MRN: [id number] DOB: 10-Jan-1976  Today's TOC FU Call Status: Today's TOC FU Call Status:: Successful TOC FU Call Completed Unsuccessful Call (1st Attempt) Date: 02/14/24 [facility] FU Call Complete Date: 02/15/24 Patient's Name and Date of Birth confirmed.  Transition Care Management Follow-up Telephone Call Date of Discharge: 02/13/24 Discharge Facility: [facility]) Type of Discharge: Inpatient Admission Primary Inpatient Discharge Diagnosis:: alcohol use How have you been since you were released from the hospital?: Better Any questions or concerns?: No  Items Reviewed: Did you receive and understand the discharge instructions provided?: Yes Medications obtained,verified, and reconciled?: Yes (Medications Reviewed) Any new allergies since your discharge?: No Dietary orders reviewed?: Yes Do you have support at home?: Yes People in Home [RPT]: child(ren), adult  Medications Reviewed Today: Medications Reviewed Today     Reviewed by Emmitt Pan, LPN (Licensed Practical Nurse) on 02/15/24 at 1148  Med List Status: <None>   Medication Order Taking? Sig Documenting Provider Last Dose Status Informant  Azelastine  HCl 137 MCG/SPRAY SOLN [id number] Yes PLACE 2 SPRAYS INTO BOTH NOSTRILS 2 (TWO) TIMES DAILY. USE IN EACH NOSTRIL AS DIRECTED  Patient taking differently: Place 2 sprays into the nose daily as needed (allergies).   Purcell Emil Schanz, MD  Active Self, Pharmacy Records  diclofenac Sodium (VOLTAREN) 1 % GEL [id number]  Apply 2 g topically 4 (four) times daily.  Patient not taking: Reported on 02/15/2024   Swayze, Ava, DO  Active    Patient not taking:   Discontinued 04/06/20 0628 FLUoxetine  (PROZAC ) 20 MG capsule [id number] Yes Take 1 capsule (20 mg total) by mouth daily. Arfeen, Syed T, MD  Active   folic acid  (FOLVITE ) 1 MG tablet [id number] Yes Take 1 tablet (1 mg total) by mouth daily. Elgergawy, Brayton RAMAN, MD  Active Self, Pharmacy  Records  lidocaine  (XYLOCAINE ) 2 % solution [id number] Yes Use as directed 15 mLs in the mouth or throat every 3 (three) hours as needed for mouth pain. Swayze, Ava, DO  Active   Multiple Vitamin (MULTIVITAMIN WITH MINERALS) TABS tablet [id number] Yes Take 1 tablet by mouth daily. Swayze, Ava, DO  Active   naltrexone  (DEPADE) 50 MG tablet [id number] Yes Take 1 tablet (50 mg total) by mouth daily. Arfeen, Syed T, MD  Active   pantoprazole  (PROTONIX ) 40 MG tablet [id number] Yes Take 1 tablet (40 mg total) by mouth 2 (two) times daily. Switch for any other PPI at similar dose and frequency  Patient taking differently: Take 40 mg by mouth daily.   Purcell Emil Schanz, MD  Active Self, Pharmacy Records  sucralfate  (CARAFATE ) 1 GM/10ML suspension [id number] Yes Take 10 mLs (1 g total) by mouth 3 (three) times daily.  Patient taking differently: Take 1 g by mouth daily as needed (indegestion).   Elgergawy, Brayton RAMAN, MD  Active Self, Pharmacy Records  thiamine  (VITAMIN B1) 100 MG tablet [id number] Yes Take 1 tablet (100 mg total) by mouth daily. Elgergawy, Brayton RAMAN, MD  Active Self, Pharmacy Records  traZODone  (DESYREL ) 50 MG tablet [id number] Yes Take 0.5 tablets (25 mg total) by mouth at bedtime as needed for sleep. Curry Leni DASEN, MD  Active             Home Care and Equipment/Supplies: Were Home Health Services Ordered?: NA Any new equipment or medical supplies ordered?: NA  Functional Questionnaire: Do you need assistance with bathing/showering or dressing?: No Do you need assistance with meal preparation?: No Do you need assistance with  eating?: No Do you have difficulty maintaining continence: No Do you need assistance with getting out of bed/getting out of a chair/moving?: No Do you have difficulty managing or taking your medications?: No  Follow up appointments reviewed: PCP Follow-up appointment confirmed?: No (declined) MD Provider Line [phone] Given: No Specialist  Hospital Follow-up appointment confirmed?: NA Do you need transportation to your follow-up appointment?: No Do you understand care options if your condition(s) worsen?: Yes-patient verbalized understanding    SIGNATURE Julian Lemmings, LPN [facility] Nurse Health Advisor Direct Dial [phone]

## 2024-02-18 ENCOUNTER — Ambulatory Visit (INDEPENDENT_AMBULATORY_CARE_PROVIDER_SITE_OTHER): Payer: MEDICAID

## 2024-02-18 DIAGNOSIS — F322 Major depressive disorder, single episode, severe without psychotic features: Secondary | ICD-10-CM

## 2024-02-18 DIAGNOSIS — F1020 Alcohol dependence, uncomplicated: Secondary | ICD-10-CM | POA: Diagnosis not present

## 2024-02-18 DIAGNOSIS — F4312 Post-traumatic stress disorder, chronic: Secondary | ICD-10-CM
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
## 2024-02-25 ENCOUNTER — Ambulatory Visit (HOSPITAL_COMMUNITY): Payer: MEDICAID

## 2024-02-25 ENCOUNTER — Telehealth (HOSPITAL_COMMUNITY): Payer: Self-pay | Admitting: Licensed Clinical Social Worker

## 2024-02-25 ENCOUNTER — Encounter (HOSPITAL_COMMUNITY): Payer: Self-pay

## 2024-02-25 NOTE — Telephone Encounter (Signed)
 Danielle Reilly calls saying that she will need to start group Wednesday rather than today as she has a consultation regarding her chipped tooth this morning at 10 a.m. but says that she will be in group Wednesday and that she has not taken a drink.  Zell Maier, MA, LCSW, [facility], LCAS 02/25/2024

## 2024-02-26 ENCOUNTER — Ambulatory Visit (INDEPENDENT_AMBULATORY_CARE_PROVIDER_SITE_OTHER): Payer: MEDICAID | Admitting: Licensed Clinical Social Worker

## 2024-02-26 DIAGNOSIS — F331 Major depressive disorder, recurrent, moderate: Secondary | ICD-10-CM | POA: Diagnosis not present

## 2024-02-26 DIAGNOSIS — F4312 Post-traumatic stress disorder, chronic: Secondary | ICD-10-CM

## 2024-02-26 DIAGNOSIS — F4321 Adjustment disorder with depressed mood: Secondary | ICD-10-CM

## 2024-02-26 DIAGNOSIS — F1020 Alcohol dependence, uncomplicated: Secondary | ICD-10-CM | POA: Diagnosis not present

## 2024-02-26 NOTE — Progress Notes (Signed)
 Virtual Visit via Video Note   I connected with Danielle Reilly on 02/26/24 at 3:00pm by video enabled telemedicine application and verified that I am speaking with the correct person using two identifiers.   I discussed the limitations, risks, security and privacy concerns of performing an evaluation and management service by video and the availability of in person appointments. I also discussed with the patient that there may be a patient responsible charge related to this service. The patient expressed understanding and agreed to proceed.   I discussed the assessment and treatment plan with the patient. The patient was provided an opportunity to ask questions and all were answered. The patient agreed with the plan and demonstrated an understanding of the instructions.   The patient was advised to call back or seek an in-person evaluation if the symptoms worsen or if the condition fails to improve as anticipated.   I provided 30 minutes of non-face-to-face time during this encounter.     [name], LCSW, LCAS ______________________________  THERAPIST PROGRESS NOTE   Session Time:  3:00pm - 3:30pm   Location: Patient: Patient car (parked)  Provider: Home Office   Participation Level: Active    Behavioral Response: Alert, casually dressed, euthymic mood/affect   Type of Therapy:  Individual Therapy   Treatment Goals addressed: Depression/anxiety management; Medication compliance; Monitoring alcohol use  Progress Towards Goals: Progressing   Interventions: CBT, motivational interviewing, relapse prevention    Summary: Danielle Reilly is a 48 year old single AA female that presented for therapy appointment today with diagnosis of PTSD; Major depressive disorder, recurrent, moderate with anxious distress; Alcohol Use Disorder, moderate; and Unresolved Grief.  Suicidal/Homicidal: None; without intent or plan.     Therapist Response:  Clinician met with Danielle Reilly for virtual therapy  appointment and assessed for safety, sobriety, and medication compliance.  Danielle Reilly presented for session on time and was alert, oriented x5, with no evidence or self-report of active SI/HI or A/V H.  Danielle Reilly reported that she did end up relapsing on alcohol since our last check-in, stating "It was the most scared I've been in my life".  She reported compliance with medication.  Clinician inquired about Danielle Reilly current emotional ratings, as well as any significant changes in thoughts, feelings, or behavior since last check-in.  Danielle Reilly reported scores of 2/10 for depression, 2/10 for anxiety, and 0/10 for anger/irritability.  Danielle Reilly denied any panic attacks or outbursts.  Clinician inquired about Danielle Reilly's recent relapse episode, including any internal/external triggers that might have influenced this, and any prevention strategies she attempted to implement to avoid this outcome.  Danielle Reilly reported that she had been sober for 30 days and could not think of any triggers that would have led her to drink again.  She reported that during this binge episode, she ended up blacking out, and injured herself more than once, including a concussion and broken tooth.  Clinician inquired about whether Danielle Reilly had been examined by a medical doctor due to reported severity of injuries sustained.  Danielle Reilly reported that she did eventually go to the hospital, had an MRI and is also planning to have her front tooth repaired through her dentist.  Danielle Reilly reported that when she was in the hospital, she was recommended to engage in a substance abuse program upon discharge, and due to consequences of recent binge episode, she accepted this referral.  Clinician praised Danielle Reilly for accepting this referral, and inquired about details of the program, including when she is scheduled to begin, length  of stay, schedule, structure and overseeing provider(s).  Danielle Reilly reported that she was referred to attend CDIOP through [facility] on Monday, Wednesday, and Friday of this  week, and will be meeting with Danielle Reilly and Danielle Reilly.  She reported that she expects to be attending for several weeks, and is excited, but uncertain about what to expect since she has not engaged in this level of care before.  Clinician provided explanation of typical chemical dependency intensive outpatient programs, including benefits she could gain from consistent admission such as additional relapse prevention strategies and peer support.   Clinician inquired about whether Danielle Reilly will continue attending individual therapy while in this program.  Danielle Reilly reported that she would like to, but will need to check with insurance first.  Danielle Reilly reported that due to her work schedule today, she could only meet for 30 minutes before her shift started.  Progress is evidenced by Danielle Reilly deciding to voluntarily enter CDIOP following recent relapse, and following up with primary clinician to process recent events in session leading to this decision. Clinician will collaborate with CDIOP clinicians as needed to ensure coordination of care and will continue to monitor.                     Plan: Follow up in 1 week.   Diagnosis: PTSD; Major depressive disorder, recurrent, moderate with anxious distress; Alcohol Use Disorder, moderate; and Unresolved Grief.  Collaboration of Care:   None required at this time.                                                   Patient/Guardian was advised Release of Information must be obtained prior to any record release in order to collaborate their care with an outside provider. Patient/Guardian was advised if they have not already done so to contact the registration department to sign all necessary forms in order for us  to release information regarding their care.    Consent: Patient/Guardian gives verbal consent for treatment and assignment of benefits for services provided during this visit. Patient/Guardian expressed understanding and agreed to proceed.   [name], [place],  LCAS 02/26/24

## 2024-02-27 ENCOUNTER — Ambulatory Visit (INDEPENDENT_AMBULATORY_CARE_PROVIDER_SITE_OTHER): Payer: MEDICAID | Admitting: Licensed Clinical Social Worker

## 2024-02-27 DIAGNOSIS — F322 Major depressive disorder, single episode, severe without psychotic features: Secondary | ICD-10-CM

## 2024-02-27 DIAGNOSIS — F1020 Alcohol dependence, uncomplicated: Secondary | ICD-10-CM | POA: Diagnosis not present

## 2024-02-27 DIAGNOSIS — F4312 Post-traumatic stress disorder, chronic: Secondary | ICD-10-CM
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
## 2024-02-29 ENCOUNTER — Ambulatory Visit (HOSPITAL_COMMUNITY): Payer: MEDICAID | Admitting: Medical

## 2024-02-29 ENCOUNTER — Encounter (HOSPITAL_COMMUNITY): Payer: Self-pay | Admitting: Medical

## 2024-02-29 ENCOUNTER — Other Ambulatory Visit (INDEPENDENT_AMBULATORY_CARE_PROVIDER_SITE_OTHER): Payer: MEDICAID | Admitting: Medical

## 2024-02-29 DIAGNOSIS — F331 Major depressive disorder, recurrent, moderate: Secondary | ICD-10-CM

## 2024-02-29 DIAGNOSIS — Z90710 Acquired absence of both cervix and uterus: Secondary | ICD-10-CM

## 2024-02-29 DIAGNOSIS — F1020 Alcohol dependence, uncomplicated: Secondary | ICD-10-CM

## 2024-02-29 DIAGNOSIS — F4312 Post-traumatic stress disorder, chronic: Secondary | ICD-10-CM | POA: Diagnosis not present

## 2024-02-29 DIAGNOSIS — T7491XS Unspecified adult maltreatment, confirmed, sequela: Secondary | ICD-10-CM

## 2024-02-29 DIAGNOSIS — F99 Mental disorder, not otherwise specified: Secondary | ICD-10-CM

## 2024-02-29 DIAGNOSIS — Z62819 Personal history of unspecified abuse in childhood: Secondary | ICD-10-CM

## 2024-02-29 DIAGNOSIS — Z6372 Alcoholism and drug addiction in family: Secondary | ICD-10-CM | POA: Diagnosis not present

## 2024-02-29 DIAGNOSIS — F4321 Adjustment disorder with depressed mood: Secondary | ICD-10-CM | POA: Diagnosis not present

## 2024-02-29 DIAGNOSIS — F322 Major depressive disorder, single episode, severe without psychotic features: Secondary | ICD-10-CM | POA: Diagnosis not present

## 2024-02-29 DIAGNOSIS — F5105 Insomnia due to other mental disorder: Secondary | ICD-10-CM

## 2024-02-29 DIAGNOSIS — K2920 Alcoholic gastritis without bleeding: Secondary | ICD-10-CM

## 2024-02-29 DIAGNOSIS — K709 Alcoholic liver disease, unspecified: Secondary | ICD-10-CM

## 2024-02-29 DIAGNOSIS — K2210 Ulcer of esophagus without bleeding: Secondary | ICD-10-CM

## 2024-02-29 MED ORDER — BACLOFEN 10 MG PO TABS: 10.0000 mg | ORAL_TABLET | Freq: Three times a day (TID) | ORAL | 1 refills | Status: DC

## 2024-02-29 NOTE — Progress Notes (Deleted)
 Patient ID: Danielle Reilly, female   DOB: Sep 06, 1975, 48 y.o.   MRN: [id number]
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
## 2024-02-29 NOTE — Progress Notes (Signed)
 Seen for intake

## 2024-03-03 ENCOUNTER — Ambulatory Visit (HOSPITAL_COMMUNITY): Payer: MEDICAID

## 2024-03-05 ENCOUNTER — Ambulatory Visit (INDEPENDENT_AMBULATORY_CARE_PROVIDER_SITE_OTHER): Payer: MEDICAID | Admitting: Licensed Clinical Social Worker

## 2024-03-05 ENCOUNTER — Encounter (HOSPITAL_COMMUNITY): Payer: Self-pay | Admitting: Medical

## 2024-03-05 DIAGNOSIS — F322 Major depressive disorder, single episode, severe without psychotic features: Secondary | ICD-10-CM

## 2024-03-05 DIAGNOSIS — F1020 Alcohol dependence, uncomplicated: Secondary | ICD-10-CM

## 2024-03-05 DIAGNOSIS — F4312 Post-traumatic stress disorder, chronic: Secondary | ICD-10-CM
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
## 2024-03-05 NOTE — Addendum Note (Signed)
 Addended by: LEILA CARLIN BRAVO on: 03/05/2024 06:01 PM   Modules accepted: Level of Service

## 2024-03-05 NOTE — Progress Notes (Signed)
 Daily Group Progress Note   Program: [facility] IOP     Group Time: 9 a.m. to 12 p.m.      Type of Therapy: Process and Psychoeducational    Topic: The therapists check in with group members, assess for SI/HI/psychosis and overall level of functioning. The therapists inquire about sobriety date and number of community support meetings attended since last session.  The therapist facilitate discussions on several different topics including the need to be selfish in one's recovery putting recovery above everything else, the need to stop interacting up interacting with people and active addiction in their personal lives, and how eventually coming out as an addict or alcoholic can be liberating and help to shutdown the efforts of those trying to pull them back into using. The therapists continue to stress the importance of assertive communication.     Summary: Danielle Reilly presents today rating her depression and anxiety as a 2.   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 Danielle Reilly did not admit to the guy that she could not take the job as she is an alcoholic in recovery but indicated she did not want to take it as she does not want a job in which she has to split the tips.  Danielle Reilly talked to her aunt on the phone and told her aunt about thinking about drinking with the aunt telling her that the second one thinks about drinking that he or she has already relapsed. It becomes clear that this aunt is the one who was sober for about 20 years but has relapsed and returned to drinking. The therapist suggests that Danielle Reilly not get advice about recovery from people in active addiction. He also questions how she felt after getting feedback from her godmother in active addiction and her aunt in active addiction with Danielle Reilly admitting that she felt bad after both interactions.   She says that her mood is anxious and conflicted. She is  conflicted because a guy she's been seeing for 15 years wants to marry her but will not stop drinking. Group members explain that people in recovery with less than a year of sobriety not in a relationship should not enter one. Also, it is pointed out that as this man remains in active addiction that Danielle Reilly should break off this relationship. It is recommended that she needs to start building sober supports via attending AA.   The therapist explains that having thoughts about drinking from time to time will occur but the critical issue is that a person does not act on these thoughts and picks up the phone to discuss them with a person in recovery and not a person in active addiction.    Progress Towards Goals: Danielle Reilly reports no change in her sobriety date.  UDS collected: No Results: No   AA/NA attended?: No   Sponsor?:  No   Elsie Maier, MA, [place], [facility], LCAS Darice Simpler, MS, LMFT, LCAS 03/05/2024

## 2024-03-07 ENCOUNTER — Ambulatory Visit (HOSPITAL_COMMUNITY): Payer: MEDICAID

## 2024-03-07 ENCOUNTER — Telehealth (HOSPITAL_COMMUNITY): Payer: Self-pay | Admitting: Licensed Clinical Social Worker

## 2024-03-07 NOTE — Telephone Encounter (Signed)
 The therapist receives a voicemail from [place] saying that she will not be in group today due to car trouble.  Zell Maier, MA, LCSW, [facility], LCAS 03/07/2024

## 2024-03-10 ENCOUNTER — Telehealth (HOSPITAL_COMMUNITY): Payer: Self-pay | Admitting: Licensed Clinical Social Worker

## 2024-03-10 ENCOUNTER — Ambulatory Visit (HOSPITAL_COMMUNITY): Payer: MEDICAID
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
## 2024-03-11 ENCOUNTER — Ambulatory Visit (HOSPITAL_COMMUNITY): Payer: MEDICAID | Admitting: Licensed Clinical Social Worker

## 2024-03-12 ENCOUNTER — Ambulatory Visit (HOSPITAL_COMMUNITY): Payer: MEDICAID

## 2024-03-14 ENCOUNTER — Ambulatory Visit (HOSPITAL_COMMUNITY): Payer: MEDICAID

## 2024-03-15 ENCOUNTER — Other Ambulatory Visit (HOSPITAL_COMMUNITY): Payer: Self-pay | Admitting: Psychiatry

## 2024-03-15 DIAGNOSIS — F322 Major depressive disorder, single episode, severe without psychotic features: Secondary | ICD-10-CM

## 2024-03-15 DIAGNOSIS — F10188 Alcohol abuse with other alcohol-induced disorder: Secondary | ICD-10-CM

## 2024-03-15 DIAGNOSIS — F4312 Post-traumatic stress disorder, chronic: Secondary | ICD-10-CM

## 2024-03-17 ENCOUNTER — Ambulatory Visit (INDEPENDENT_AMBULATORY_CARE_PROVIDER_SITE_OTHER): Payer: MEDICAID | Admitting: Medical

## 2024-03-17 ENCOUNTER — Encounter (HOSPITAL_COMMUNITY): Payer: Self-pay | Admitting: Medical

## 2024-03-17 DIAGNOSIS — F4312 Post-traumatic stress disorder, chronic: Secondary | ICD-10-CM

## 2024-03-17 DIAGNOSIS — F331 Major depressive disorder, recurrent, moderate: Secondary | ICD-10-CM

## 2024-03-17 DIAGNOSIS — F5105 Insomnia due to other mental disorder: Secondary | ICD-10-CM

## 2024-03-17 DIAGNOSIS — T7491XS Unspecified adult maltreatment, confirmed, sequela: Secondary | ICD-10-CM

## 2024-03-17 DIAGNOSIS — F1020 Alcohol dependence, uncomplicated: Secondary | ICD-10-CM

## 2024-03-17 DIAGNOSIS — F10188 Alcohol abuse with other alcohol-induced disorder: Secondary | ICD-10-CM | POA: Diagnosis not present

## 2024-03-17 DIAGNOSIS — Z6372 Alcoholism and drug addiction in family: Secondary | ICD-10-CM

## 2024-03-17 DIAGNOSIS — F99 Mental disorder, not otherwise specified: Secondary | ICD-10-CM

## 2024-03-17 DIAGNOSIS — Z62819 Personal history of unspecified abuse in childhood: Secondary | ICD-10-CM

## 2024-03-17 DIAGNOSIS — F4321 Adjustment disorder with depressed mood: Secondary | ICD-10-CM

## 2024-03-17 DIAGNOSIS — F322 Major depressive disorder, single episode, severe without psychotic features: Secondary | ICD-10-CM

## 2024-03-17 MED ORDER — FLUOXETINE HCL 20 MG PO CAPS: 20.0000 mg | ORAL_CAPSULE | Freq: Every day | ORAL | 1 refills | Status: DC
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
## 2024-03-19 ENCOUNTER — Ambulatory Visit (INDEPENDENT_AMBULATORY_CARE_PROVIDER_SITE_OTHER): Payer: MEDICAID

## 2024-03-19 DIAGNOSIS — F1020 Alcohol dependence, uncomplicated: Secondary | ICD-10-CM

## 2024-03-19 DIAGNOSIS — F322 Major depressive disorder, single episode, severe without psychotic features: Secondary | ICD-10-CM

## 2024-03-19 DIAGNOSIS — F4312 Post-traumatic stress disorder, chronic: Secondary | ICD-10-CM
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
## 2024-03-19 NOTE — Addendum Note (Signed)
 Addended by: LEILA CARLIN BRAVO on: 03/19/2024 05:44 PM   Modules accepted: Orders

## 2024-03-20 ENCOUNTER — Telehealth (HOSPITAL_COMMUNITY): Payer: Self-pay

## 2024-03-20 NOTE — Telephone Encounter (Signed)
 Therapist calls Danielle Reilly to ask her if she is planning to come in tomorrow tomorrow morning for [facility] IOP as inclement weather is being predicted. Therapist reaches Voice Mail and leaves a HIPAA compliant message requesting a return call.  Darice Simpler, MS, LMFT, LCAS

## 2024-03-21 ENCOUNTER — Ambulatory Visit (HOSPITAL_COMMUNITY): Payer: MEDICAID

## 2024-03-21 ENCOUNTER — Telehealth (HOSPITAL_COMMUNITY): Payer: Self-pay

## 2024-03-21 NOTE — Telephone Encounter (Signed)
 Therapist calls Danielle Reilly to inform her that because the inclement weather did not materialize, we will be holding the group. Adin answers and replies that she had already planned to take the day off. She did not speak to whether she would come today. Therapist let her know that therapists are here today if she is interested.  Darice Simpler, MS, LMFT, LCAS

## 2024-03-24 ENCOUNTER — Telehealth (HOSPITAL_COMMUNITY): Payer: Self-pay

## 2024-03-24 ENCOUNTER — Ambulatory Visit (HOSPITAL_COMMUNITY): Payer: MEDICAID

## 2024-03-24 NOTE — Telephone Encounter (Signed)
 Therapist calls Danielle Reilly as she was not in CD IOP this morning. She reminds this therapist that she had to take her son to an appointment. She says she plans to come on Wednesday.  Darice Simpler, MS, LMFT, LCAS

## 2024-03-26 ENCOUNTER — Ambulatory Visit (INDEPENDENT_AMBULATORY_CARE_PROVIDER_SITE_OTHER): Payer: MEDICAID

## 2024-03-26 ENCOUNTER — Ambulatory Visit (HOSPITAL_COMMUNITY): Payer: MEDICAID | Admitting: Licensed Clinical Social Worker

## 2024-03-26 DIAGNOSIS — F4312 Post-traumatic stress disorder, chronic: Secondary | ICD-10-CM

## 2024-03-26 DIAGNOSIS — F1020 Alcohol dependence, uncomplicated: Secondary | ICD-10-CM

## 2024-03-26 DIAGNOSIS — F322 Major depressive disorder, single episode, severe without psychotic features: Secondary | ICD-10-CM

## 2024-03-26 NOTE — Progress Notes (Signed)
 Daily Group Progress Note   Program: [facility] IOP     Group Time: 9 a.m. to 12 p.m.      Type of Therapy: Process and Psychoeducational    Topic: The therapists check in with group members, assess for SI/HI/psychosis and overall level of functioning. The therapists inquire about sobriety date and number of community support meetings attended since last session.  Therapists read the daily reflection from AA today and prompted discussion on the reading. Therapists discuss the following issues and prompt discussion:  how step 4 involves looking at character defects and how doing the work of changing these is an important part of recovery, listening to how the stories of those in recovery in 12 step meetings change over time and the more invested one becomes in recovery the more honesty one finds in their story, the five rules of recovery as presented by Elspeth Rocker, PhD including 1) Change your life, 2) Be completely honest, 3) ask for help, 4) practice self-care and 5) don't bend the rules.  Summary: Franca presents rating her depression as a 3 and her anxiety as a 4.  She reports the same sobriety date. Brandelyn says she is attending virtual meetings but is ready to start attending in person. She says she plans to start attending in person meetings on next Monday.  Johnisha discusses the character defect of people that lie about their use and how she feels this violates their moral compass.    Hannah identifies her emotions as anxious and nervous.  She attributes these emotions to her feelings about the anniversary deaths of her parents. She says her father's 3rd death anniversary was last 26-Apr-2024 and her mother's 5th anniversary will be on 04-14-24.  Laysha says she and her sister never grieved together.  She says she spoke to her sister and they are thinking about going to [place] to have a [employer]. Eliyana notes that she did not drink on her father's death anniversary. She says he sang well and there is a  youtube video of him singing but she decided not to watch it this year.   Moya says her mother's ashes are still in her living room and she has asked her family members to take the ashes and do what they want with them. She says she has asked several times and thinks she may not give them a deadline to have them out.  Brenlee says her take away for today is from Dr. Abran Five Rules of Recovery. She says she had difficulty asking for help and though she could pursue recovery on her own but realized she could not do this alone.  Progress Towards Goals: Shikara reports no change in her sobriety date.  UDS collected: no  Results: None   AA/NA attended?: Yes   Sponsor?:  No   Darice Simpler, MS, LMFT  Elsie Maier, [place], [place], [facility], LCAS  03/26/24

## 2024-03-28 ENCOUNTER — Ambulatory Visit (INDEPENDENT_AMBULATORY_CARE_PROVIDER_SITE_OTHER): Payer: MEDICAID

## 2024-03-28 DIAGNOSIS — F4312 Post-traumatic stress disorder, chronic: Secondary | ICD-10-CM

## 2024-03-28 DIAGNOSIS — F322 Major depressive disorder, single episode, severe without psychotic features: Secondary | ICD-10-CM

## 2024-03-28 DIAGNOSIS — F1020 Alcohol dependence, uncomplicated: Secondary | ICD-10-CM
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
## 2024-03-31 ENCOUNTER — Ambulatory Visit (INDEPENDENT_AMBULATORY_CARE_PROVIDER_SITE_OTHER): Payer: MEDICAID | Admitting: Medical

## 2024-03-31 ENCOUNTER — Encounter (HOSPITAL_COMMUNITY): Payer: Self-pay

## 2024-03-31 DIAGNOSIS — F4312 Post-traumatic stress disorder, chronic: Secondary | ICD-10-CM

## 2024-03-31 DIAGNOSIS — F10188 Alcohol abuse with other alcohol-induced disorder: Secondary | ICD-10-CM

## 2024-03-31 DIAGNOSIS — F331 Major depressive disorder, recurrent, moderate: Secondary | ICD-10-CM

## 2024-03-31 DIAGNOSIS — F1020 Alcohol dependence, uncomplicated: Secondary | ICD-10-CM | POA: Diagnosis not present

## 2024-03-31 DIAGNOSIS — Z62819 Personal history of unspecified abuse in childhood: Secondary | ICD-10-CM

## 2024-03-31 DIAGNOSIS — K2920 Alcoholic gastritis without bleeding: Secondary | ICD-10-CM

## 2024-03-31 DIAGNOSIS — K2210 Ulcer of esophagus without bleeding: Secondary | ICD-10-CM

## 2024-03-31 DIAGNOSIS — F5105 Insomnia due to other mental disorder: Secondary | ICD-10-CM

## 2024-03-31 DIAGNOSIS — K709 Alcoholic liver disease, unspecified: Secondary | ICD-10-CM

## 2024-03-31 DIAGNOSIS — Z90710 Acquired absence of both cervix and uterus: Secondary | ICD-10-CM

## 2024-03-31 DIAGNOSIS — F4321 Adjustment disorder with depressed mood: Secondary | ICD-10-CM

## 2024-03-31 DIAGNOSIS — T7491XS Unspecified adult maltreatment, confirmed, sequela: Secondary | ICD-10-CM

## 2024-03-31 DIAGNOSIS — Z6372 Alcoholism and drug addiction in family: Secondary | ICD-10-CM

## 2024-03-31 DIAGNOSIS — F322 Major depressive disorder, single episode, severe without psychotic features: Secondary | ICD-10-CM

## 2024-03-31 NOTE — Progress Notes (Signed)
 Daily Group Progress Note   Program: CD IOP     Group Time: 9 a.m. to 12 p.m.      Type of Therapy: Process and Psychoeducational    Topic: The therapists check in with group members, assess for SI/HI/psychosis and overall level of functioning. The therapists inquire about sobriety date and number of community support meetings attended since last session.  The therapist show the video, Breaking the Addiction Cycle by Velia Norfolk. She uses the diagram akin to [employer] of the [place] but substitutes substance use where he uses sexual addiction.  Therapist cover the first part of the cycle of addiction: Belief System, Impaired Thinking to addiction cycle to unmanageability. Therapist asks group to identify what the group members thinks an alcoholic and drug addict are, in efforts to show if someone believes an addict looks one way, then the person who is saying this cannot possibility as they do not look that way. Therapist asks the group to identify their favorite impaired thought. Therapists discussed the difficulties of working with people with addictions that live in a closed system, not believing they have an addiction.  Therapist discuss how it is often helpful to enroll the assist of other systems to reach the person in the closed system.  Summary: Alden presents rating her depression as a 2 and her anxiety as a 3. She has been attending virtual meetings but plans to go to an in person meeting tonight.  She identifies her emotions as calm and relaxed.  Katee says she does not have a particular image of an addict or alcoholic. She says she thinks anyone could be an addict. She says she grew up with parents who used alcohol  or drugs.  Blannie noted her drinking increased when her mother passed away. She says the difference in her drinking after her mother passed away was she realized she could not put alcohol  down.  Jacari says her belief system can be summed up that she has thought that  one day she could drink normal again. Berenice says the MD has told her she has had acute pancreatitis and she does not want to get to the place where she had chronic pancreatitis.    Progress Towards Goals: Laynie reports no change in her sobriety date.  UDS collected:  Yes  Results: None   AA/NA attended?: Yes   Sponsor?:  No   Darice Simpler, MS, LMFT  Elsie Maier, [place], [place], [facility], LCAS 03/30/24
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
## 2024-04-02 ENCOUNTER — Ambulatory Visit (HOSPITAL_COMMUNITY): Payer: MEDICAID

## 2024-04-02 DIAGNOSIS — F1020 Alcohol dependence, uncomplicated: Secondary | ICD-10-CM

## 2024-04-02 DIAGNOSIS — F4312 Post-traumatic stress disorder, chronic: Secondary | ICD-10-CM

## 2024-04-02 NOTE — Progress Notes (Signed)
 CD IOP No charge  Type: Individual  Therapist interventions: The therapists check in with group members, assess for SI/HI/psychosis and overall level of functioning. The therapists inquire about sobriety date and number of community support meetings attended since last session.   Therapists present information on the following issues: progression of addiction, the importance of living in the present and how we can be grateful for the moments we have with others, Showed the unmanageble part of the Belief System in [facility] video. Breaking the Cycle of Addiction   Summary:  Lynnzie presents today rating her depression and anxiety both as a 3.  She reports she will not be here on Monday, as she needs to work. She says she went to an in person meeting at [facility] on Monday and a lady raised her hand regarding her willingness to be a sponsor. Vaani says she is going to the same in person meeting this next Monday and is if this lady is there she plans to ask this lady to be her sponsor. Yasmyn says she is not sleeping as well and she thinks it is because she thinks of her family members dying. She notes that anything could happen at any time.  She states there were several family deaths in 2021-2022 and had an uncle pass away earlier in this year.  Jaeleah reports she looked at the questions on the steps 1 and 2.  Zane answers the question in the video which asks. What was the one thing that got you to realize that you needed to do something regarding your addiction.  Jakaila reports it was having black outs and starting to have falls that could have ended in serious injury that made her realize she needed to seek treatment for her addiction.  Suicidal/Homicidal: denies   Darice Simpler, MS, LMFT, LCAS Elsie Maier, MA, LCSW, [facility], LCAS

## 2024-04-04 ENCOUNTER — Encounter (HOSPITAL_COMMUNITY): Payer: Self-pay

## 2024-04-04 ENCOUNTER — Ambulatory Visit (HOSPITAL_COMMUNITY): Payer: MEDICAID

## 2024-04-07 ENCOUNTER — Telehealth (HOSPITAL_COMMUNITY): Payer: Self-pay

## 2024-04-07 ENCOUNTER — Ambulatory Visit (HOSPITAL_COMMUNITY): Payer: MEDICAID

## 2024-04-07 NOTE — Telephone Encounter (Signed)
 Danica calls and leaves a message last night at 9:47 pm saying she will not be in group today as she has to go to DSS to make sure her medicaid is active. She says she went online to deal with it, but was not able to straighten it out so she is going to the DSS office. She says she will see us  on 04-13-24.    Therapist calls Lindaann to remind her we are having CD IOP on 04-09-24. She reminded this therapist that she is doing a promotion for work on 04-09-24 and plans to return on 04-13-24.  Darice Simpler, MS, LMFT, LCAS

## 2024-04-08 ENCOUNTER — Ambulatory Visit (HOSPITAL_COMMUNITY): Payer: MEDICAID | Admitting: Licensed Clinical Social Worker

## 2024-04-11 ENCOUNTER — Ambulatory Visit (HOSPITAL_COMMUNITY): Payer: MEDICAID

## 2024-04-14 ENCOUNTER — Ambulatory Visit (HOSPITAL_COMMUNITY): Payer: MEDICAID

## 2024-04-14 ENCOUNTER — Telehealth (HOSPITAL_COMMUNITY): Payer: Self-pay | Admitting: Licensed Clinical Social Worker

## 2024-04-14 ENCOUNTER — Encounter (HOSPITAL_COMMUNITY): Payer: Self-pay

## 2024-04-14 NOTE — Telephone Encounter (Signed)
 Danielle Reilly leaves a message this morning saying that she woke up late but would be in group today; however, she does not show. Thus, the therapist calls her confirming her identity via two identifiers. She says that she was going to come to group; however, her niece surprised her as did her cousin so she went out for lunch with them as today is the anniversary of her mother's death.   She says that she will be in group on Wednesday and indicates that she has 60 days sober from alcohol . The therapist will address her positive urine drug screens for amphetamines when she returns to group.  Zell Maier, MA, LCSW, [facility], LCAS 04/14/2024

## 2024-04-16 ENCOUNTER — Ambulatory Visit (HOSPITAL_COMMUNITY): Payer: MEDICAID

## 2024-04-16 ENCOUNTER — Telehealth (HOSPITAL_COMMUNITY): Payer: Self-pay | Admitting: Licensed Clinical Social Worker

## 2024-04-16 NOTE — Telephone Encounter (Signed)
 The therapist receives a call from [facility] saying that she will not be in group today as she is tired and sore. She says that she will be in group on Friday. The therapist informs her as it has been protracted break since she was last seen that she needs to come for an individual meeting prior to returning to group so she is scheduled for 04/22/23 at 10:30 a.m.  Zell Maier, MA, LCSW, [facility], LCAS 04/16/2024

## 2024-04-21 ENCOUNTER — Ambulatory Visit (INDEPENDENT_AMBULATORY_CARE_PROVIDER_SITE_OTHER): Payer: MEDICAID

## 2024-04-21 ENCOUNTER — Encounter (HOSPITAL_COMMUNITY): Payer: Self-pay

## 2024-04-21 DIAGNOSIS — F4312 Post-traumatic stress disorder, chronic: Secondary | ICD-10-CM

## 2024-04-21 DIAGNOSIS — F99 Mental disorder, not otherwise specified: Secondary | ICD-10-CM

## 2024-04-21 DIAGNOSIS — F1510 Other stimulant abuse, uncomplicated: Secondary | ICD-10-CM

## 2024-04-21 DIAGNOSIS — F4321 Adjustment disorder with depressed mood: Secondary | ICD-10-CM

## 2024-04-21 DIAGNOSIS — T7491XS Unspecified adult maltreatment, confirmed, sequela: Secondary | ICD-10-CM

## 2024-04-21 DIAGNOSIS — Z6372 Alcoholism and drug addiction in family: Secondary | ICD-10-CM

## 2024-04-21 DIAGNOSIS — F331 Major depressive disorder, recurrent, moderate: Secondary | ICD-10-CM

## 2024-04-21 DIAGNOSIS — Z62819 Personal history of unspecified abuse in childhood: Secondary | ICD-10-CM

## 2024-04-21 DIAGNOSIS — F1020 Alcohol dependence, uncomplicated: Secondary | ICD-10-CM

## 2024-04-21 DIAGNOSIS — F322 Major depressive disorder, single episode, severe without psychotic features: Secondary | ICD-10-CM

## 2024-04-21 NOTE — Progress Notes (Addendum)
 No Charge  Time 10:30 am to 10:50 am  Type: Individual  Summary:  Danielle Reilly presents today to meet individual as the therapists in [facility] asked to meet with her when she called wanted to re-join the group after missing for 2 weeks.  Therapist explains the results of the UDS to Danielle Reilly today and reviews her UDS from 03-17-24 and 03-31-24 which were both positive for Amphetamines.  Her levels doubled from the UDS when she tested on 03-31-24.  Therapist asks Danielle Reilly if she can explain these results. She says that she took a few of her son's Adderall pills. She says there are none left. Therapist explains [facility] policy for total abstinence in SAIOP.  Danielle Reilly says she has not drank and provides her sobriety date from the last time she drank.  Danielle Reilly demonstrates no insight regarding her using Adderal as being a relapse. Danielle Reilly says she took the Adderall because she was so tired from working and her son knows about it. She says she got behind in her finances when she was hospitalized and so she needs to focus on working now.  She discusses that she needs to start physical therapy on her knee and needs some dental work done.  Therapist asks Danielle Reilly what she would like to do at this point regarding treatment. She says her schedule is not going to allow her to attend [facility] IOP so she wants to step down back to [facility] and Dr. Curry. She says she will call to get back on their schedule.    UDS: tests positive today on rapid UDS for amphetamines  Plan: Danielle Reilly will be discharged from [facility] IOP and will step down to individual therapy and medication management

## 2024-04-21 NOTE — Addendum Note (Signed)
 Addended by: LEILA CARLIN BRAVO on: 04/21/2024 03:00 PM   Modules accepted: Level of Service
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
## 2024-04-29 ENCOUNTER — Ambulatory Visit (INDEPENDENT_AMBULATORY_CARE_PROVIDER_SITE_OTHER): Payer: MEDICAID | Admitting: Licensed Clinical Social Worker

## 2024-04-29 DIAGNOSIS — F4321 Adjustment disorder with depressed mood: Secondary | ICD-10-CM | POA: Diagnosis not present

## 2024-04-29 DIAGNOSIS — F331 Major depressive disorder, recurrent, moderate: Secondary | ICD-10-CM

## 2024-04-29 DIAGNOSIS — F1020 Alcohol dependence, uncomplicated: Secondary | ICD-10-CM

## 2024-04-29 DIAGNOSIS — F4312 Post-traumatic stress disorder, chronic: Secondary | ICD-10-CM | POA: Diagnosis not present

## 2024-04-29 NOTE — Progress Notes (Signed)
 THERAPIST PROGRESS NOTE   Session Time:  1:00pm - 1:50pm    Location: Patient: [facility] Provider: [facility]   Participation Level: Active    Behavioral Response: Alert, casually dressed, euthymic mood/affect   Type of Therapy:  Individual Therapy   Treatment Goals addressed: Depression/anxiety management; Medication compliance; Psychiatry followup; Monitoring alcohol  use; Following exercise regimen; Maintaining healthier boundaries with family; Considering trauma therapy referral; Safety planning   Progress Towards Goals: Progressing   Interventions: CBT, treatment planning    Summary: Danielle Reilly is a 49 year old single AA female that presented for therapy appointment today with diagnosis of PTSD; Major depressive disorder, recurrent, moderate with anxious distress; Alcohol  Use Disorder, moderate; and Unresolved Grief.  Suicidal/Homicidal: None; without intent or plan.     Therapist Response:  Clinician met with Danielle Reilly for in-person therapy session and assessed for safety, sobriety, and medication compliance.  Danielle Reilly presented for appointment on time and was alert, oriented x5, with no evidence or self-report of active SI/HI or A/V H.  Danielle Reilly reported that she has remained sober from alcohol  and drugs since completion of [facility].  She reported compliance with medication.  Clinician inquired about Danielle Reilly's emotional ratings today, as well as any significant changes in thoughts, feelings, or behavior since previous check-in.  Danielle Reilly reported scores of 1/10 for depression, 1/10 for anxiety, and 0/10 for anger/irritability.  Danielle Reilly denied any panic attacks or outbursts. Danielle Reilly reported that a recent success was completing [facility] and deciding to continue with individual therapy.  She reported that she has been attending virtual recovery meetings every day, in addition to using a recovery app on her phone.  Danielle Reilly reported that she hopes to find a sponsor once she attends in person meetings more  consistently.  Clinician suggested revisiting treatment plan with Danielle Reilly today to make updates based upon recent transition in level of care.  Danielle Reilly was agreeable to this, so clinician collaborated with her to make changes as follows with her verbal consent:  Meet with clinician once every 2 weeks for therapy sessions to update on goal progress and address any needs that arise; Follow up with psychiatrist every 2-3 months regarding efficacy of medication and any dose modification necessary; Take medications daily as prescribed to aid in symptom reduction and improvement of daily functioning; Maintain depression severity at low daily level (0-1/10) over the next 90 days by attending regular therapy sessions, and engaging in healthy self-care activities 1 hour per day to keep mind engaged such as coloring, dancing, and kickboxing; Maintain average anxiety level at 0-1/10 in severity over next 90 days by practicing relaxation techniques with proven efficacy 2-3x daily, in addition to challenging anxious thoughts that arise to negate negative impact on outlook; Exercise for at least 30 minutes, x2-3 per week in addition to following healthy diet to improve both physical and mental well-being per PCP recommendations; Implement 4-5x sleep hygiene techniques in order to increase average nightly rest to 8 hours and reduce related fatigue and irritability upon waking over next 90 days; Consider accepting referral for Danielle Reilly approved therapist for treatment of PTSD symptoms within the next 30 days; Outreach Danielle Reilly for individual and group grief counseling services within next 90 days in order to assist with processing loss of parents; Attend church sessions x1 per month to stay engaged with supportive community, and maintain spiritual support; Commit to remaining abstinent from alcohol  use indefinitely due to negative impact on both mental and physical health by following daily relapse  prevention plan, including  virtual or in person recovery meetings attendance x1 per day, and locating a sponsor within the next 90 days;  Maintain healthier boundaries with all supports to avoid worsening anxiety and depression, as well as enforce assertive communication skills, with goal of limiting contact with toxic supports until more respectful communication patterns are established; Voluntarily seek admission to hospital for crisis intervention should SI/HI or A/V H appear and put safety of self or others at risk.  Progress is evidenced by Danielle Reilly choosing to reduce frequency of therapy sessions due to increased stability, consistency with psychiatry appointments, and medication compliance.  She reported that she is more open to trauma therapy and will consider accepting referral in 30 days.  Danielle Reilly reported that she has reduced overall exercise due to knee pain, but will be following up with an orthopedist next week to asses through MRI.  She reported that grief can still be hard to cope with during holidays, but she is leaning on family for support versus isolation.  Danielle Reilly reported that she has not been attending church consistently, but may be more successful if she attends with her aunt, whom she trusts.  Danielle Reilly reported that she remains compliant with safety plan.  Clinician will continue to monitor.                     Plan: Follow up in 2 weeks.     Diagnosis: PTSD; Major depressive disorder, recurrent, moderate with anxious distress; Alcohol  Use Disorder, moderate; and Unresolved Grief.  Collaboration of Care:   None required at this time.                                                   Patient/Guardian was advised Release of Information must be obtained prior to any record release in order to collaborate their care with an outside provider. Patient/Guardian was advised if they have not already done so to contact the registration department to sign all necessary forms in order for us  to release information regarding their  care.    Consent: Patient/Guardian gives verbal consent for treatment and assignment of benefits for services provided during this visit. Patient/Guardian expressed understanding and agreed to proceed.   [name], [place], LCAS 04/29/24

## 2024-04-30 ENCOUNTER — Telehealth (HOSPITAL_COMMUNITY): Payer: MEDICAID | Admitting: Psychiatry

## 2024-04-30 ENCOUNTER — Encounter (HOSPITAL_COMMUNITY): Payer: Self-pay | Admitting: Psychiatry

## 2024-04-30 VITALS — Wt 171.0 lb

## 2024-04-30 DIAGNOSIS — F1021 Alcohol dependence, in remission: Secondary | ICD-10-CM

## 2024-04-30 DIAGNOSIS — F4312 Post-traumatic stress disorder, chronic: Secondary | ICD-10-CM | POA: Diagnosis not present

## 2024-04-30 DIAGNOSIS — F322 Major depressive disorder, single episode, severe without psychotic features: Secondary | ICD-10-CM

## 2024-04-30 DIAGNOSIS — F1910 Other psychoactive substance abuse, uncomplicated: Secondary | ICD-10-CM | POA: Diagnosis not present

## 2024-04-30 MED ORDER — FLUOXETINE HCL 20 MG PO CAPS: 20.0000 mg | ORAL_CAPSULE | Freq: Every day | ORAL | 2 refills | Status: AC

## 2024-04-30 MED ORDER — BACLOFEN 10 MG PO TABS: 10.0000 mg | ORAL_TABLET | Freq: Three times a day (TID) | ORAL | 2 refills | Status: AC
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
## 2024-05-12 ENCOUNTER — Telehealth (HOSPITAL_COMMUNITY): Payer: Self-pay | Admitting: Licensed Clinical Social Worker

## 2024-05-12 NOTE — Telephone Encounter (Signed)
 Danielle Reilly has an in-person appointment scheduled for 05/13/24 at 3pm.  Clinician outreached her by phone today at 1:16pm in order to inform her of need to change this appointment to virtual format due to adverse weather conditions impacting the area and making travel dangerous over following days.  [name] answered this phone call and confirmed schedule change.  Clinician informed management and front desk staff to assist with schedule changes.     [name], LCSW, LCAS 05/12/24

## 2024-05-13 ENCOUNTER — Ambulatory Visit (HOSPITAL_COMMUNITY): Payer: MEDICAID | Admitting: Licensed Clinical Social Worker

## 2024-05-13 DIAGNOSIS — F4312 Post-traumatic stress disorder, chronic: Secondary | ICD-10-CM | POA: Diagnosis not present

## 2024-05-13 DIAGNOSIS — F4321 Adjustment disorder with depressed mood: Secondary | ICD-10-CM | POA: Diagnosis not present

## 2024-05-13 DIAGNOSIS — F1020 Alcohol dependence, uncomplicated: Secondary | ICD-10-CM

## 2024-05-13 DIAGNOSIS — F331 Major depressive disorder, recurrent, moderate: Secondary | ICD-10-CM | POA: Diagnosis not present

## 2024-05-13 NOTE — Progress Notes (Signed)
 Virtual Visit via Video Note   I connected with Danielle Reilly on 05/13/24 at 3:00pm by video enabled telemedicine application and verified that I am speaking with the correct person using two identifiers.   I discussed the limitations, risks, security and privacy concerns of performing an evaluation and management service by video and the availability of in person appointments. I also discussed with the patient that there may be a patient responsible charge related to this service. The patient expressed understanding and agreed to proceed.   I discussed the assessment and treatment plan with the patient. The patient was provided an opportunity to ask questions and all were answered. The patient agreed with the plan and demonstrated an understanding of the instructions.   The patient was advised to call back or seek an in-person evaluation if the symptoms worsen or if the condition fails to improve as anticipated.   I provided 31 minutes of non-face-to-face time during this encounter.   Danielle Ricker, LCSW, LCAS ________________________________  THERAPIST PROGRESS NOTE   Session Time:  3:00pm - 3:31pm     Location: Patient: Patient Home Provider: Home Office   Participation Level: Active    Behavioral Response: Alert, casually dressed, euthymic mood/affect   Type of Therapy:  Individual Therapy   Treatment Goals addressed: Depression/anxiety management; Medication compliance; Maintaining abstinence from alcohol  via relapse prevention plan   Progress Towards Goals: Progressing   Interventions: CBT, relapse prevention, problem solving     Summary: Danielle Reilly is a 49 year old single AA female that presented for therapy appointment today with diagnosis of PTSD; Major depressive disorder, recurrent, moderate with anxious distress; Alcohol  Use Disorder, moderate; and Unresolved Grief.  Suicidal/Homicidal: None; without intent or plan.     Therapist Response:  Clinician met with Danielle Reilly  for virtual therapy appointment and assessed for safety, sobriety, and medication compliance.  Danielle Reilly presented for session on time and was alert, oriented x5, with no evidence or self-report of active SI/HI or A/V H.  Danielle Reilly reported that she has remained sober from alcohol  and drugs over 90 days.  She reported compliance with medication.  Clinician inquired about Dare's current emotional ratings, as well as any significant changes in thoughts, feelings, or behavior since last check-in.  Danielle Reilly reported scores of 1/10 for depression, 2/10 for anxiety, and 0/10 for anger/irritability.  Danielle Reilly denied any panic attacks or outbursts. Danielle Reilly reported that an ongoing success has been staying sober from alcohol .  She reported that she was recently invited to go on a weekend trip to [place] in March, but has been debating whether to accept due to relapse risks.  Clinician revisited a handout on topic of relapse prevention planning to assist Danielle Reilly in preparing for this potential trip.  This handout explained how external triggers (I.e. people, places and things) can influence the likelihood of someone using a particular substance, and Danielle Reilly was tasked with inventorying specific ones she could relate to, with examples such as spending time with people who use drugs or drink, going on a vacation, driving, or being around certain family members.  Danielle Reilly was asked to consider the risk level of relapsing for various scenarios she might end up in on this trip on a severity scale from 0-100%, with 0 indicating no chance of drinking, and 100 being absolute certainty of drinking.  Clinician encouraged Danielle Reilly to complete a cost benefit analysis regarding how this trip might impact overall recovery efforts.  Intervention was effective, as evidenced by [name] actively engaging  in discussion on subject, and identifying several potential triggers that could put sobriety at risk.  [name] reported that everyone on this trip will be drinking, and  some may be using marijuana,  She reported that it is likely her sister will engage in peer pressure, which has made her relapse in the past at a wedding.  Danielle Reilly reported that the drama with this group of people could wear her patience thin, in addition to having to be a designated driver for the length of the trip.  Danielle Reilly reported that she is motivated to maintain her sobriety and avoid returning to old habits, so she will decline the offer to go on this trip, and plan a safer alternative as a reward as she nears over 100 days of sobriety.  She stated I wont have the patience to be around them all tore up.  Clinician will continue to monitor.                     Plan: Follow up in 2 weeks.     Diagnosis: PTSD; Major depressive disorder, recurrent, moderate with anxious distress; Alcohol  Use Disorder, moderate; and Unresolved Grief.  Collaboration of Care:   None required at this time.                                                   Patient/Guardian was advised Release of Information must be obtained prior to any record release in order to collaborate their care with an outside provider. Patient/Guardian was advised if they have not already done so to contact the registration department to sign all necessary forms in order for us  to release information regarding their care.    Consent: Patient/Guardian gives verbal consent for treatment and assignment of benefits for services provided during this visit. Patient/Guardian expressed understanding and agreed to proceed.   Danielle Ricker, LCSW, LCAS 05/13/24

## 2024-05-27 ENCOUNTER — Ambulatory Visit (HOSPITAL_COMMUNITY): Payer: MEDICAID | Admitting: Licensed Clinical Social Worker

## 2024-06-03 ENCOUNTER — Ambulatory Visit: Payer: MEDICAID | Admitting: Emergency Medicine

## 2024-06-10 ENCOUNTER — Ambulatory Visit (HOSPITAL_COMMUNITY): Payer: MEDICAID | Admitting: Licensed Clinical Social Worker

## 2024-06-24 ENCOUNTER — Ambulatory Visit (HOSPITAL_COMMUNITY): Payer: MEDICAID | Admitting: Licensed Clinical Social Worker

## 2024-07-08 ENCOUNTER — Ambulatory Visit (HOSPITAL_COMMUNITY): Payer: MEDICAID | Admitting: Licensed Clinical Social Worker

## 2024-07-30 ENCOUNTER — Telehealth (HOSPITAL_COMMUNITY): Payer: MEDICAID | Admitting: Psychiatry

## 2024-08-12 ENCOUNTER — Encounter: Payer: MEDICAID | Admitting: Emergency Medicine
